# Patient Record
Sex: Female | Born: 1950 | Race: White | Hispanic: No | Marital: Married | Smoking: Never smoker
Health system: Southern US, Community
[De-identification: ages and names within clinical notes are randomized; demographics above are authoritative.]

## PROBLEM LIST (undated history)

## (undated) DIAGNOSIS — J45909 Unspecified asthma, uncomplicated: Secondary | ICD-10-CM

## (undated) DIAGNOSIS — I509 Heart failure, unspecified: Secondary | ICD-10-CM

## (undated) DIAGNOSIS — I519 Heart disease, unspecified: Secondary | ICD-10-CM

## (undated) DIAGNOSIS — R0600 Dyspnea, unspecified: Secondary | ICD-10-CM

## (undated) DIAGNOSIS — Z95810 Presence of automatic (implantable) cardiac defibrillator: Secondary | ICD-10-CM

## (undated) DIAGNOSIS — I4891 Unspecified atrial fibrillation: Secondary | ICD-10-CM

## (undated) DIAGNOSIS — I428 Other cardiomyopathies: Secondary | ICD-10-CM

## (undated) DIAGNOSIS — I10 Essential (primary) hypertension: Secondary | ICD-10-CM

## (undated) DIAGNOSIS — I442 Atrioventricular block, complete: Secondary | ICD-10-CM

## (undated) DIAGNOSIS — M1990 Unspecified osteoarthritis, unspecified site: Secondary | ICD-10-CM

## (undated) DIAGNOSIS — I340 Nonrheumatic mitral (valve) insufficiency: Secondary | ICD-10-CM

## (undated) DIAGNOSIS — Z9889 Other specified postprocedural states: Secondary | ICD-10-CM

## (undated) DIAGNOSIS — R519 Headache, unspecified: Secondary | ICD-10-CM

## (undated) DIAGNOSIS — E119 Type 2 diabetes mellitus without complications: Secondary | ICD-10-CM

## (undated) DIAGNOSIS — J189 Pneumonia, unspecified organism: Secondary | ICD-10-CM

## (undated) DIAGNOSIS — N184 Chronic kidney disease, stage 4 (severe): Secondary | ICD-10-CM

## (undated) DIAGNOSIS — R112 Nausea with vomiting, unspecified: Secondary | ICD-10-CM

## (undated) DIAGNOSIS — K219 Gastro-esophageal reflux disease without esophagitis: Secondary | ICD-10-CM

## (undated) DIAGNOSIS — I4819 Other persistent atrial fibrillation: Secondary | ICD-10-CM

## (undated) DIAGNOSIS — I350 Nonrheumatic aortic (valve) stenosis: Secondary | ICD-10-CM

## (undated) DIAGNOSIS — N189 Chronic kidney disease, unspecified: Secondary | ICD-10-CM

## (undated) DIAGNOSIS — Q211 Atrial septal defect, unspecified: Secondary | ICD-10-CM

## (undated) DIAGNOSIS — E039 Hypothyroidism, unspecified: Secondary | ICD-10-CM

## (undated) HISTORY — DX: Type 2 diabetes mellitus without complications: E11.9

## (undated) HISTORY — PX: CATARACT EXTRACTION: SUR2

## (undated) HISTORY — DX: Other cardiomyopathies: I42.8

## (undated) HISTORY — DX: Atrioventricular block, complete: I44.2

## (undated) HISTORY — PX: CHOLECYSTECTOMY: SHX55

## (undated) HISTORY — PX: BREAST BIOPSY: SHX20

## (undated) HISTORY — DX: Heart disease, unspecified: I51.9

## (undated) HISTORY — PX: ABLATION: SHX5711

## (undated) HISTORY — DX: Essential (primary) hypertension: I10

## (undated) HISTORY — PX: TOTAL VAGINAL HYSTERECTOMY: SHX2548

## (undated) HISTORY — DX: Chronic kidney disease, stage 4 (severe): N18.4

## (undated) HISTORY — PX: THYROIDECTOMY: SHX17

## (undated) HISTORY — DX: Presence of automatic (implantable) cardiac defibrillator: Z95.810

## (undated) HISTORY — DX: Other persistent atrial fibrillation: I48.19

## (undated) HISTORY — DX: Chronic kidney disease, unspecified: N18.9

## (undated) HISTORY — DX: Unspecified atrial fibrillation: I48.91

---

## 1998-03-02 ENCOUNTER — Inpatient Hospital Stay: Admission: EM | Admit: 1998-03-02 | Discharge: 1998-03-07 | Payer: Self-pay | Admitting: *Deleted

## 1998-03-02 ENCOUNTER — Encounter: Payer: Self-pay | Admitting: *Deleted

## 1998-03-04 ENCOUNTER — Encounter: Payer: Self-pay | Admitting: *Deleted

## 1998-03-06 ENCOUNTER — Encounter: Payer: Self-pay | Admitting: *Deleted

## 1998-04-02 ENCOUNTER — Inpatient Hospital Stay (HOSPITAL_COMMUNITY): Admission: AD | Admit: 1998-04-02 | Discharge: 1998-04-08 | Payer: Self-pay | Admitting: *Deleted

## 1998-04-02 ENCOUNTER — Encounter: Payer: Self-pay | Admitting: Thoracic Surgery (Cardiothoracic Vascular Surgery)

## 1998-04-03 ENCOUNTER — Encounter: Payer: Self-pay | Admitting: *Deleted

## 1998-04-04 ENCOUNTER — Encounter: Payer: Self-pay | Admitting: *Deleted

## 1998-04-05 ENCOUNTER — Encounter: Payer: Self-pay | Admitting: *Deleted

## 1998-04-06 ENCOUNTER — Encounter: Payer: Self-pay | Admitting: Cardiology

## 1998-04-06 ENCOUNTER — Encounter: Payer: Self-pay | Admitting: Thoracic Surgery (Cardiothoracic Vascular Surgery)

## 1998-04-07 ENCOUNTER — Encounter: Payer: Self-pay | Admitting: *Deleted

## 1998-04-30 ENCOUNTER — Encounter: Payer: Self-pay | Admitting: Internal Medicine

## 1998-04-30 ENCOUNTER — Inpatient Hospital Stay (HOSPITAL_COMMUNITY): Admission: EM | Admit: 1998-04-30 | Discharge: 1998-05-02 | Payer: Self-pay | Admitting: Emergency Medicine

## 1998-05-01 ENCOUNTER — Encounter: Payer: Self-pay | Admitting: *Deleted

## 1998-07-29 ENCOUNTER — Inpatient Hospital Stay (HOSPITAL_COMMUNITY): Admission: AD | Admit: 1998-07-29 | Discharge: 1998-08-02 | Payer: Self-pay | Admitting: *Deleted

## 2003-03-01 HISTORY — PX: CARDIAC DEFIBRILLATOR PLACEMENT: SHX171

## 2003-04-18 ENCOUNTER — Ambulatory Visit (HOSPITAL_COMMUNITY): Admission: RE | Admit: 2003-04-18 | Discharge: 2003-04-19 | Payer: Self-pay | Admitting: Internal Medicine

## 2003-04-23 ENCOUNTER — Ambulatory Visit (HOSPITAL_COMMUNITY): Admission: RE | Admit: 2003-04-23 | Discharge: 2003-04-24 | Payer: Self-pay | Admitting: Internal Medicine

## 2004-11-11 ENCOUNTER — Ambulatory Visit: Payer: Self-pay | Admitting: Internal Medicine

## 2004-11-11 ENCOUNTER — Ambulatory Visit: Payer: Self-pay | Admitting: *Deleted

## 2005-02-08 ENCOUNTER — Ambulatory Visit: Payer: Self-pay | Admitting: Internal Medicine

## 2005-05-10 ENCOUNTER — Ambulatory Visit: Payer: Self-pay | Admitting: Internal Medicine

## 2005-11-10 ENCOUNTER — Ambulatory Visit: Payer: Self-pay | Admitting: Internal Medicine

## 2011-05-24 DIAGNOSIS — E7800 Pure hypercholesterolemia, unspecified: Secondary | ICD-10-CM | POA: Insufficient documentation

## 2011-05-24 DIAGNOSIS — Z95810 Presence of automatic (implantable) cardiac defibrillator: Secondary | ICD-10-CM | POA: Insufficient documentation

## 2011-05-24 DIAGNOSIS — I10 Essential (primary) hypertension: Secondary | ICD-10-CM | POA: Insufficient documentation

## 2011-05-24 DIAGNOSIS — I420 Dilated cardiomyopathy: Secondary | ICD-10-CM | POA: Insufficient documentation

## 2012-04-15 DIAGNOSIS — N179 Acute kidney failure, unspecified: Secondary | ICD-10-CM | POA: Insufficient documentation

## 2012-04-15 HISTORY — DX: Acute kidney failure, unspecified: N17.9

## 2012-05-02 DIAGNOSIS — I5023 Acute on chronic systolic (congestive) heart failure: Secondary | ICD-10-CM | POA: Insufficient documentation

## 2012-09-11 DIAGNOSIS — K529 Noninfective gastroenteritis and colitis, unspecified: Secondary | ICD-10-CM | POA: Insufficient documentation

## 2012-09-11 HISTORY — DX: Noninfective gastroenteritis and colitis, unspecified: K52.9

## 2014-04-21 DIAGNOSIS — Z7901 Long term (current) use of anticoagulants: Secondary | ICD-10-CM | POA: Insufficient documentation

## 2014-04-21 DIAGNOSIS — Z4502 Encounter for adjustment and management of automatic implantable cardiac defibrillator: Secondary | ICD-10-CM | POA: Insufficient documentation

## 2014-04-21 HISTORY — DX: Encounter for adjustment and management of automatic implantable cardiac defibrillator: Z45.02

## 2015-12-18 DIAGNOSIS — I513 Intracardiac thrombosis, not elsewhere classified: Secondary | ICD-10-CM | POA: Insufficient documentation

## 2016-04-05 DIAGNOSIS — I4819 Other persistent atrial fibrillation: Secondary | ICD-10-CM | POA: Insufficient documentation

## 2016-04-09 DIAGNOSIS — N179 Acute kidney failure, unspecified: Secondary | ICD-10-CM | POA: Insufficient documentation

## 2016-04-10 DIAGNOSIS — E871 Hypo-osmolality and hyponatremia: Secondary | ICD-10-CM | POA: Insufficient documentation

## 2016-04-10 DIAGNOSIS — E119 Type 2 diabetes mellitus without complications: Secondary | ICD-10-CM | POA: Insufficient documentation

## 2016-04-10 DIAGNOSIS — Z794 Long term (current) use of insulin: Secondary | ICD-10-CM

## 2016-04-10 DIAGNOSIS — K529 Noninfective gastroenteritis and colitis, unspecified: Secondary | ICD-10-CM | POA: Insufficient documentation

## 2016-04-10 HISTORY — DX: Hypomagnesemia: E83.42

## 2016-04-10 HISTORY — DX: Hypo-osmolality and hyponatremia: E87.1

## 2016-04-10 HISTORY — DX: Type 2 diabetes mellitus without complications: E11.9

## 2016-04-10 HISTORY — DX: Long term (current) use of insulin: Z79.4

## 2016-04-11 DIAGNOSIS — E039 Hypothyroidism, unspecified: Secondary | ICD-10-CM | POA: Insufficient documentation

## 2016-04-11 DIAGNOSIS — K219 Gastro-esophageal reflux disease without esophagitis: Secondary | ICD-10-CM | POA: Insufficient documentation

## 2016-08-11 DIAGNOSIS — R21 Rash and other nonspecific skin eruption: Secondary | ICD-10-CM

## 2016-08-11 HISTORY — DX: Rash and other nonspecific skin eruption: R21

## 2016-08-26 DIAGNOSIS — N184 Chronic kidney disease, stage 4 (severe): Secondary | ICD-10-CM | POA: Insufficient documentation

## 2016-08-28 DIAGNOSIS — I2510 Atherosclerotic heart disease of native coronary artery without angina pectoris: Secondary | ICD-10-CM | POA: Insufficient documentation

## 2016-12-26 ENCOUNTER — Ambulatory Visit: Payer: Self-pay | Admitting: Internal Medicine

## 2016-12-29 ENCOUNTER — Encounter: Payer: Self-pay | Admitting: *Deleted

## 2016-12-29 ENCOUNTER — Encounter: Payer: Self-pay | Admitting: Cardiovascular Disease

## 2016-12-29 ENCOUNTER — Ambulatory Visit (INDEPENDENT_AMBULATORY_CARE_PROVIDER_SITE_OTHER): Payer: PPO | Admitting: Cardiovascular Disease

## 2016-12-29 VITALS — BP 131/66 | HR 70 | Ht 62.0 in | Wt 121.8 lb

## 2016-12-29 DIAGNOSIS — I5022 Chronic systolic (congestive) heart failure: Secondary | ICD-10-CM | POA: Diagnosis not present

## 2016-12-29 DIAGNOSIS — Z95810 Presence of automatic (implantable) cardiac defibrillator: Secondary | ICD-10-CM

## 2016-12-29 DIAGNOSIS — I5023 Acute on chronic systolic (congestive) heart failure: Secondary | ICD-10-CM

## 2016-12-29 DIAGNOSIS — E785 Hyperlipidemia, unspecified: Secondary | ICD-10-CM

## 2016-12-29 DIAGNOSIS — Z794 Long term (current) use of insulin: Secondary | ICD-10-CM

## 2016-12-29 DIAGNOSIS — E119 Type 2 diabetes mellitus without complications: Secondary | ICD-10-CM

## 2016-12-29 DIAGNOSIS — E7800 Pure hypercholesterolemia, unspecified: Secondary | ICD-10-CM

## 2016-12-29 DIAGNOSIS — N184 Chronic kidney disease, stage 4 (severe): Secondary | ICD-10-CM

## 2016-12-29 DIAGNOSIS — I420 Dilated cardiomyopathy: Secondary | ICD-10-CM | POA: Insufficient documentation

## 2016-12-29 DIAGNOSIS — I513 Intracardiac thrombosis, not elsewhere classified: Secondary | ICD-10-CM

## 2016-12-29 DIAGNOSIS — I10 Essential (primary) hypertension: Secondary | ICD-10-CM | POA: Diagnosis not present

## 2016-12-29 DIAGNOSIS — Z8679 Personal history of other diseases of the circulatory system: Secondary | ICD-10-CM | POA: Insufficient documentation

## 2016-12-29 DIAGNOSIS — D649 Anemia, unspecified: Secondary | ICD-10-CM | POA: Insufficient documentation

## 2016-12-29 DIAGNOSIS — E1122 Type 2 diabetes mellitus with diabetic chronic kidney disease: Secondary | ICD-10-CM | POA: Insufficient documentation

## 2016-12-29 DIAGNOSIS — I482 Chronic atrial fibrillation, unspecified: Secondary | ICD-10-CM | POA: Insufficient documentation

## 2016-12-29 HISTORY — DX: Anemia, unspecified: D64.9

## 2016-12-29 HISTORY — DX: Personal history of other diseases of the circulatory system: Z86.79

## 2016-12-29 NOTE — Patient Instructions (Addendum)
Your physician recommends that you schedule a follow-up appointment in: 3 months with Dr.Koneswaran   Please make apt to see Edrick Oh, RN, [facility]   You have been referred to [facility] for your ICD to be checked in December here at the [facility] physician recommends that you continue on your current medications as directed. Please refer to the Current Medication list given to you today.    If you need a refill on your cardiac medications before your next appointment, please call your pharmacy.     No lab work or tests ordered today.      Thank you for choosing [facility] !
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
## 2017-01-05 DIAGNOSIS — I420 Dilated cardiomyopathy: Secondary | ICD-10-CM | POA: Diagnosis not present

## 2017-01-09 DIAGNOSIS — Z6821 Body mass index (BMI) 21.0-21.9, adult: Secondary | ICD-10-CM | POA: Diagnosis not present

## 2017-01-09 DIAGNOSIS — M7661 Achilles tendinitis, right leg: Secondary | ICD-10-CM | POA: Diagnosis not present

## 2017-01-09 DIAGNOSIS — I10 Essential (primary) hypertension: Secondary | ICD-10-CM | POA: Diagnosis not present

## 2017-01-09 DIAGNOSIS — R944 Abnormal results of kidney function studies: Secondary | ICD-10-CM | POA: Diagnosis not present

## 2017-01-09 DIAGNOSIS — E782 Mixed hyperlipidemia: Secondary | ICD-10-CM | POA: Diagnosis not present

## 2017-01-09 DIAGNOSIS — I4891 Unspecified atrial fibrillation: Secondary | ICD-10-CM | POA: Diagnosis not present

## 2017-01-09 DIAGNOSIS — E119 Type 2 diabetes mellitus without complications: Secondary | ICD-10-CM | POA: Diagnosis not present

## 2017-01-09 DIAGNOSIS — E039 Hypothyroidism, unspecified: Secondary | ICD-10-CM | POA: Diagnosis not present

## 2017-01-24 ENCOUNTER — Telehealth: Payer: Self-pay

## 2017-01-24 DIAGNOSIS — E782 Mixed hyperlipidemia: Secondary | ICD-10-CM | POA: Diagnosis not present

## 2017-01-24 DIAGNOSIS — R944 Abnormal results of kidney function studies: Secondary | ICD-10-CM | POA: Diagnosis not present

## 2017-01-24 DIAGNOSIS — I10 Essential (primary) hypertension: Secondary | ICD-10-CM | POA: Diagnosis not present

## 2017-01-24 DIAGNOSIS — M7661 Achilles tendinitis, right leg: Secondary | ICD-10-CM | POA: Diagnosis not present

## 2017-01-24 DIAGNOSIS — E119 Type 2 diabetes mellitus without complications: Secondary | ICD-10-CM | POA: Diagnosis not present

## 2017-01-24 DIAGNOSIS — Z6821 Body mass index (BMI) 21.0-21.9, adult: Secondary | ICD-10-CM | POA: Diagnosis not present

## 2017-01-24 DIAGNOSIS — I4891 Unspecified atrial fibrillation: Secondary | ICD-10-CM | POA: Diagnosis not present

## 2017-01-24 DIAGNOSIS — E039 Hypothyroidism, unspecified: Secondary | ICD-10-CM | POA: Diagnosis not present

## 2017-01-24 NOTE — Telephone Encounter (Signed)
Patient referred to [facility] clinic by Dr Dwana Curd.  Call to patient and provided ICM intro.  She agreed to [facility] monthly follow up.  She lives with daughter and has monitor by bedside. She has relocated from [place] and recently moved to this area.  Explained I have requested through [employer] to have her released to our device clinic and will schedule the 1st ICM remote transmission for 02/09/2017.  Provided ICM direct number.  She does not have a voice mail set up.   She has initial new patient appointment with Dr Lovena Le on 03/16/2017.

## 2017-01-30 DIAGNOSIS — I482 Chronic atrial fibrillation: Secondary | ICD-10-CM | POA: Diagnosis not present

## 2017-01-30 DIAGNOSIS — E039 Hypothyroidism, unspecified: Secondary | ICD-10-CM | POA: Diagnosis not present

## 2017-01-30 DIAGNOSIS — I10 Essential (primary) hypertension: Secondary | ICD-10-CM | POA: Diagnosis not present

## 2017-01-30 DIAGNOSIS — Z8679 Personal history of other diseases of the circulatory system: Secondary | ICD-10-CM | POA: Diagnosis not present

## 2017-01-30 DIAGNOSIS — E119 Type 2 diabetes mellitus without complications: Secondary | ICD-10-CM | POA: Diagnosis not present

## 2017-01-30 DIAGNOSIS — N184 Chronic kidney disease, stage 4 (severe): Secondary | ICD-10-CM | POA: Diagnosis not present

## 2017-01-30 DIAGNOSIS — Z6821 Body mass index (BMI) 21.0-21.9, adult: Secondary | ICD-10-CM | POA: Diagnosis not present

## 2017-01-30 DIAGNOSIS — I509 Heart failure, unspecified: Secondary | ICD-10-CM | POA: Diagnosis not present

## 2017-01-30 DIAGNOSIS — E782 Mixed hyperlipidemia: Secondary | ICD-10-CM | POA: Diagnosis not present

## 2017-02-09 ENCOUNTER — Ambulatory Visit (INDEPENDENT_AMBULATORY_CARE_PROVIDER_SITE_OTHER): Payer: PPO

## 2017-02-09 DIAGNOSIS — I5022 Chronic systolic (congestive) heart failure: Secondary | ICD-10-CM

## 2017-02-09 DIAGNOSIS — Z95810 Presence of automatic (implantable) cardiac defibrillator: Secondary | ICD-10-CM | POA: Diagnosis not present

## 2017-02-10 NOTE — Progress Notes (Signed)
EPIC Encounter for ICM Monitoring  Patient Name: Emily [name] is a 66 y.o. female Date: 02/10/2017 Primary Care Physican: Celene Squibb, MD Primary Cardiologist: Bronson Ing Electrophysiologist: Lovena Le Dry Weight: 120 lbs ( 119-120 lbs is baseline ) Bi-V Pacing:   94%   Clinical Status (05-Jan-2017 to 10-Feb-2017) Treated VT/VF 0 episodes AT/AF 99 episodes  Time in AT/AF 0.2 hr/day (0.9%)  Longest AT/AF 21 minutes VT-NS (>4 beats, >150 bpm) 4 Observations (2) (05-Jan-2017 to 10-Feb-2017)  Alert: AT/AF >= 6 hr for 1 days.  Avg. Ventricular Rate >= 100 bpm during AT/AF (>= 6 hr) for 1 days.      Heart Failure questions reviewed, pt asymptomatic for fluid symptoms.  She reported she felt her heart racing last night after going to bed and this is the first time she has felt Afib since the ablation.  Device clinic nurse reviewed transmission and one of the AF episode occurred 02/09/2017.    Thoracic impedance normal.  Prescribed dosage: Furosemide 40 mg 2 tablets (80 mg total) twice a day.  Potassium 20 mEq 1 tablet once a day  Recommendations: Patient took extra Metoprolol last night because of heart racing as directed by Dr Bronson Ing.  She said she feels fine today.   Encouraged to call for fluid symptoms.  Follow-up plan: ICM clinic phone appointment on 03/13/2017.  Office appointment scheduled 03/16/2017 with Dr. Lovena Le (1st visit - patient recently moved to the area).  Copy of ICM check sent to Dr. Bronson Ing and Dr. Lovena Le.   3 month ICM trend: 02/10/2017    1 Year ICM trend:       [name], RN 02/10/2017 10:16 AM

## 2017-02-27 DIAGNOSIS — Z6821 Body mass index (BMI) 21.0-21.9, adult: Secondary | ICD-10-CM | POA: Diagnosis not present

## 2017-02-27 DIAGNOSIS — E039 Hypothyroidism, unspecified: Secondary | ICD-10-CM | POA: Diagnosis not present

## 2017-02-27 DIAGNOSIS — E119 Type 2 diabetes mellitus without complications: Secondary | ICD-10-CM | POA: Diagnosis not present

## 2017-02-27 DIAGNOSIS — Z8679 Personal history of other diseases of the circulatory system: Secondary | ICD-10-CM | POA: Diagnosis not present

## 2017-02-27 DIAGNOSIS — I4891 Unspecified atrial fibrillation: Secondary | ICD-10-CM | POA: Diagnosis not present

## 2017-02-27 DIAGNOSIS — I509 Heart failure, unspecified: Secondary | ICD-10-CM | POA: Diagnosis not present

## 2017-02-27 DIAGNOSIS — M7661 Achilles tendinitis, right leg: Secondary | ICD-10-CM | POA: Diagnosis not present

## 2017-02-27 DIAGNOSIS — N184 Chronic kidney disease, stage 4 (severe): Secondary | ICD-10-CM | POA: Diagnosis not present

## 2017-02-27 DIAGNOSIS — R944 Abnormal results of kidney function studies: Secondary | ICD-10-CM | POA: Diagnosis not present

## 2017-02-27 DIAGNOSIS — I10 Essential (primary) hypertension: Secondary | ICD-10-CM | POA: Diagnosis not present

## 2017-02-27 DIAGNOSIS — I482 Chronic atrial fibrillation: Secondary | ICD-10-CM | POA: Diagnosis not present

## 2017-02-27 DIAGNOSIS — E782 Mixed hyperlipidemia: Secondary | ICD-10-CM | POA: Diagnosis not present

## 2017-03-01 DIAGNOSIS — R070 Pain in throat: Secondary | ICD-10-CM | POA: Diagnosis not present

## 2017-03-01 DIAGNOSIS — E039 Hypothyroidism, unspecified: Secondary | ICD-10-CM | POA: Diagnosis not present

## 2017-03-01 DIAGNOSIS — I10 Essential (primary) hypertension: Secondary | ICD-10-CM | POA: Diagnosis not present

## 2017-03-01 DIAGNOSIS — E782 Mixed hyperlipidemia: Secondary | ICD-10-CM | POA: Diagnosis not present

## 2017-03-01 DIAGNOSIS — Z6821 Body mass index (BMI) 21.0-21.9, adult: Secondary | ICD-10-CM | POA: Diagnosis not present

## 2017-03-01 DIAGNOSIS — E119 Type 2 diabetes mellitus without complications: Secondary | ICD-10-CM | POA: Diagnosis not present

## 2017-03-01 DIAGNOSIS — N184 Chronic kidney disease, stage 4 (severe): Secondary | ICD-10-CM | POA: Diagnosis not present

## 2017-03-01 DIAGNOSIS — I4891 Unspecified atrial fibrillation: Secondary | ICD-10-CM | POA: Diagnosis not present

## 2017-03-01 DIAGNOSIS — L299 Pruritus, unspecified: Secondary | ICD-10-CM | POA: Diagnosis not present

## 2017-03-13 ENCOUNTER — Telehealth: Payer: Self-pay

## 2017-03-13 ENCOUNTER — Ambulatory Visit (INDEPENDENT_AMBULATORY_CARE_PROVIDER_SITE_OTHER): Payer: PPO

## 2017-03-13 DIAGNOSIS — I5022 Chronic systolic (congestive) heart failure: Secondary | ICD-10-CM | POA: Diagnosis not present

## 2017-03-13 DIAGNOSIS — Z95810 Presence of automatic (implantable) cardiac defibrillator: Secondary | ICD-10-CM | POA: Diagnosis not present

## 2017-03-13 NOTE — Progress Notes (Signed)
EPIC Encounter for ICM Monitoring  Patient Name: Emily [name] is a 67 y.o. female Date: 03/13/2017 Primary Care Physican: Celene Squibb, MD Primary Cardiologist: Bronson Ing Electrophysiologist: Lovena Le Dry Weight:    119 lbs ( 119-120 lbs is baseline ) Bi-V Pacing:   92.4%    Clinical Status (10-Feb-2017 to 13-Mar-2017) Treated VT/VF 0 episodes  AT/AF 133 episodes  Time in AT/AF 0.3 hr/day (1.0%)  Longest AT/AF 16 minutes Observations (2) (10-Feb-2017 to 13-Mar-2017)  Alert: AT/AF >= 6 hr for 1 days.  Avg. Ventricular Rate >= 100 bpm during AT/AF (>= 6 hr) for 1 days.   OBSERVATIONS (5)  Alert: AT/AF >= 6 hr for 1 days.  Avg. Ventricular Rate >= 100 bpm during AT/AF (>= 6 hr) for 1 days.  A. Capture Management: Actual safety margin (2.4 X) > programmed margin (2 X).  1 monitored VT episodes, longest was 28 sec.  Ventricular sensing episodes averaged 11 min/day since the last session.     Heart Failure questions reviewed, pt asymptomatic.   Thoracic impedance normal.  Prescribed dosage: Furosemide 40 mg 2 tablets (80 mg total) twice a day.  Potassium 20 mEq 1 tablet once a day  Labs: 01/24/2017 Creatinine 2.07, BUN 36, Sodium 140. EGFR 24-28  Recommendations: No changes.   Encouraged to call for fluid symptoms.  Follow-up plan: ICM clinic phone appointment on 04/13/2017.  Patient will call the office to reschedule the 03/16/2017 appointment with Dr Lovena Le.  Copy of ICM check sent to Dr. Lovena Le.   3 month ICM trend: 03/13/2017    1 Year ICM trend:       [name], RN 03/13/2017 1:33 PM

## 2017-03-13 NOTE — Telephone Encounter (Signed)
Spoke with pt and reminded pt of remote transmission that is due today. Pt verbalized understanding.   

## 2017-03-16 ENCOUNTER — Ambulatory Visit: Payer: PPO | Admitting: Internal Medicine

## 2017-03-29 DIAGNOSIS — Z8679 Personal history of other diseases of the circulatory system: Secondary | ICD-10-CM | POA: Diagnosis not present

## 2017-03-29 DIAGNOSIS — I4891 Unspecified atrial fibrillation: Secondary | ICD-10-CM | POA: Diagnosis not present

## 2017-03-29 DIAGNOSIS — E782 Mixed hyperlipidemia: Secondary | ICD-10-CM | POA: Diagnosis not present

## 2017-03-29 DIAGNOSIS — I482 Chronic atrial fibrillation: Secondary | ICD-10-CM | POA: Diagnosis not present

## 2017-03-29 DIAGNOSIS — E119 Type 2 diabetes mellitus without complications: Secondary | ICD-10-CM | POA: Diagnosis not present

## 2017-03-29 DIAGNOSIS — M7661 Achilles tendinitis, right leg: Secondary | ICD-10-CM | POA: Diagnosis not present

## 2017-03-29 DIAGNOSIS — R944 Abnormal results of kidney function studies: Secondary | ICD-10-CM | POA: Diagnosis not present

## 2017-03-29 DIAGNOSIS — I10 Essential (primary) hypertension: Secondary | ICD-10-CM | POA: Diagnosis not present

## 2017-03-29 DIAGNOSIS — E039 Hypothyroidism, unspecified: Secondary | ICD-10-CM | POA: Diagnosis not present

## 2017-03-29 DIAGNOSIS — Z6821 Body mass index (BMI) 21.0-21.9, adult: Secondary | ICD-10-CM | POA: Diagnosis not present

## 2017-03-29 DIAGNOSIS — I509 Heart failure, unspecified: Secondary | ICD-10-CM | POA: Diagnosis not present

## 2017-03-29 DIAGNOSIS — N184 Chronic kidney disease, stage 4 (severe): Secondary | ICD-10-CM | POA: Diagnosis not present

## 2017-04-03 ENCOUNTER — Telehealth: Payer: Self-pay | Admitting: Cardiovascular Disease

## 2017-04-03 MED ORDER — WARFARIN SODIUM 2 MG PO TABS: ORAL_TABLET | ORAL | 0 refills | Status: DC

## 2017-04-03 NOTE — Telephone Encounter (Signed)
°*  STAT* If patient is at the pharmacy, call can be transferred to refill team.   1. Which medications need to be refilled? (please list name of each medication and dose if known)  warfarin (COUMADIN) 2 MG tablet [[id number]]   2. Which pharmacy/location (including street and city if local pharmacy) is medication to be sent to?  [facility]   3. Do they need a 30 day or 90 day supply?  90 day

## 2017-04-10 ENCOUNTER — Ambulatory Visit: Payer: PPO | Admitting: *Deleted

## 2017-04-10 ENCOUNTER — Ambulatory Visit: Payer: PPO | Admitting: Cardiovascular Disease

## 2017-04-10 ENCOUNTER — Encounter: Payer: Self-pay | Admitting: Cardiovascular Disease

## 2017-04-10 VITALS — BP 122/60 | HR 80 | Ht 62.0 in | Wt 127.0 lb

## 2017-04-10 DIAGNOSIS — I4891 Unspecified atrial fibrillation: Secondary | ICD-10-CM

## 2017-04-10 DIAGNOSIS — Z95810 Presence of automatic (implantable) cardiac defibrillator: Secondary | ICD-10-CM

## 2017-04-10 DIAGNOSIS — N184 Chronic kidney disease, stage 4 (severe): Secondary | ICD-10-CM

## 2017-04-10 DIAGNOSIS — I5022 Chronic systolic (congestive) heart failure: Secondary | ICD-10-CM

## 2017-04-10 DIAGNOSIS — Z5181 Encounter for therapeutic drug level monitoring: Secondary | ICD-10-CM

## 2017-04-10 DIAGNOSIS — E785 Hyperlipidemia, unspecified: Secondary | ICD-10-CM | POA: Diagnosis not present

## 2017-04-10 DIAGNOSIS — I10 Essential (primary) hypertension: Secondary | ICD-10-CM | POA: Diagnosis not present

## 2017-04-10 DIAGNOSIS — Z79899 Other long term (current) drug therapy: Secondary | ICD-10-CM | POA: Diagnosis not present

## 2017-04-10 DIAGNOSIS — I482 Chronic atrial fibrillation, unspecified: Secondary | ICD-10-CM

## 2017-04-10 DIAGNOSIS — I513 Intracardiac thrombosis, not elsewhere classified: Secondary | ICD-10-CM

## 2017-04-10 MED ORDER — FUROSEMIDE 40 MG PO TABS: 40.0000 mg | ORAL_TABLET | Freq: Two times a day (BID) | ORAL | 3 refills | Status: DC

## 2017-04-10 NOTE — Patient Instructions (Addendum)
Your physician wants you to follow-up in: 6 months with Dr.Koneswaran You will receive a reminder letter in the mail two months in advance. If you don't receive a letter, please call our office to schedule the follow-up appointment.   DECREASE lasix to 40 mg twice a day.If you gain more than 3 lbs in 24 hours, go back to the 80 mg twice a day dose  If you need a refill on your cardiac medications before your next appointment, please call your pharmacy.    Please get lab work : BMET in 1 month    No tests ordered today.    Thank you for choosing [facility] !
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
## 2017-04-13 ENCOUNTER — Ambulatory Visit (INDEPENDENT_AMBULATORY_CARE_PROVIDER_SITE_OTHER): Payer: Self-pay

## 2017-04-13 DIAGNOSIS — Z95810 Presence of automatic (implantable) cardiac defibrillator: Secondary | ICD-10-CM

## 2017-04-13 DIAGNOSIS — I5022 Chronic systolic (congestive) heart failure: Secondary | ICD-10-CM

## 2017-04-13 NOTE — Progress Notes (Signed)
EPIC Encounter for ICM Monitoring  Patient Name: Emily [name] is a 67 y.o. female Date: 04/13/2017 Primary Care Physican: Celene Squibb, MD Primary Cardiologist:Koneswaran Electrophysiologist:Taylor Dry Weight: Previous weight 119lbs ( 119-120 lbs is baseline ) Bi-V Pacing:92.1%   Since 13-Mar-2017 Time in AT/AF <0.1 hr/day (<0.1%) Longest AT/AF 73 seconds   VT-NS (>4 beats, >150 bpm)  1   No call to patient since she is having a defib office check with Dr Caryl Comes today.   Thoracic impedance normal.  Prescribed dosage: Furosemide40 mg twice daily. Dr Bronson Ing instructed her (04/10/17 office visit) to resume 80 mg twice daily should she gain 3 pounds in 24 hours or have increasing symptoms of chest pain and/or leg swelling.   Labs: 01/24/2017 Creatinine 2.07, BUN 36, Sodium 140. EGFR 24-28  Recommendations:  None  Follow-up plan: ICM clinic phone appointment on 05/15/2017.  Office appointment scheduled 05/18/2017 with Dr. Lovena Le.  Copy of ICM check sent to Dr. Lovena Le and Dr. Bronson Ing.   3 month ICM trend: 04/13/2017    1 Year ICM trend:       [name], RN 04/13/2017 10:05 AM

## 2017-04-14 DIAGNOSIS — N184 Chronic kidney disease, stage 4 (severe): Secondary | ICD-10-CM | POA: Diagnosis not present

## 2017-04-14 DIAGNOSIS — I509 Heart failure, unspecified: Secondary | ICD-10-CM | POA: Diagnosis not present

## 2017-04-14 DIAGNOSIS — E1129 Type 2 diabetes mellitus with other diabetic kidney complication: Secondary | ICD-10-CM | POA: Diagnosis not present

## 2017-04-20 ENCOUNTER — Other Ambulatory Visit: Payer: Self-pay | Admitting: Cardiovascular Disease

## 2017-04-20 MED ORDER — SIMVASTATIN 20 MG PO TABS: 20.0000 mg | ORAL_TABLET | Freq: Every day | ORAL | 1 refills | Status: DC

## 2017-04-20 NOTE — Telephone Encounter (Signed)
simvastatin (ZOCOR) 20 MG tablet [[id number]] ENDED  Pt is needing this sent to [facility] in [place]. She has ran out of the medication she had left from her previous Dr. At [facility]

## 2017-04-20 NOTE — Telephone Encounter (Signed)
Refilled to [facility]

## 2017-04-28 DIAGNOSIS — E119 Type 2 diabetes mellitus without complications: Secondary | ICD-10-CM | POA: Diagnosis not present

## 2017-04-28 DIAGNOSIS — I482 Chronic atrial fibrillation: Secondary | ICD-10-CM | POA: Diagnosis not present

## 2017-04-28 DIAGNOSIS — I4891 Unspecified atrial fibrillation: Secondary | ICD-10-CM | POA: Diagnosis not present

## 2017-04-28 DIAGNOSIS — E039 Hypothyroidism, unspecified: Secondary | ICD-10-CM | POA: Diagnosis not present

## 2017-04-28 DIAGNOSIS — R070 Pain in throat: Secondary | ICD-10-CM | POA: Diagnosis not present

## 2017-04-28 DIAGNOSIS — I509 Heart failure, unspecified: Secondary | ICD-10-CM | POA: Diagnosis not present

## 2017-04-28 DIAGNOSIS — N184 Chronic kidney disease, stage 4 (severe): Secondary | ICD-10-CM | POA: Diagnosis not present

## 2017-04-28 DIAGNOSIS — I10 Essential (primary) hypertension: Secondary | ICD-10-CM | POA: Diagnosis not present

## 2017-04-28 DIAGNOSIS — Z8679 Personal history of other diseases of the circulatory system: Secondary | ICD-10-CM | POA: Diagnosis not present

## 2017-04-28 DIAGNOSIS — R944 Abnormal results of kidney function studies: Secondary | ICD-10-CM | POA: Diagnosis not present

## 2017-04-28 DIAGNOSIS — E782 Mixed hyperlipidemia: Secondary | ICD-10-CM | POA: Diagnosis not present

## 2017-04-28 DIAGNOSIS — L299 Pruritus, unspecified: Secondary | ICD-10-CM | POA: Diagnosis not present

## 2017-05-01 ENCOUNTER — Other Ambulatory Visit (HOSPITAL_COMMUNITY): Payer: Self-pay | Admitting: Nephrology

## 2017-05-01 DIAGNOSIS — N183 Chronic kidney disease, stage 3 unspecified: Secondary | ICD-10-CM

## 2017-05-11 ENCOUNTER — Ambulatory Visit (HOSPITAL_COMMUNITY)
Admission: RE | Admit: 2017-05-11 | Discharge: 2017-05-11 | Disposition: A | Discharge status: Home / Self Care | Payer: PPO | Source: Ambulatory Visit | Attending: Nephrology | Admitting: Nephrology

## 2017-05-11 DIAGNOSIS — D509 Iron deficiency anemia, unspecified: Secondary | ICD-10-CM | POA: Diagnosis not present

## 2017-05-11 DIAGNOSIS — N183 Chronic kidney disease, stage 3 unspecified: Secondary | ICD-10-CM

## 2017-05-11 DIAGNOSIS — R809 Proteinuria, unspecified: Secondary | ICD-10-CM | POA: Diagnosis not present

## 2017-05-11 DIAGNOSIS — Z1159 Encounter for screening for other viral diseases: Secondary | ICD-10-CM | POA: Diagnosis not present

## 2017-05-11 DIAGNOSIS — E559 Vitamin D deficiency, unspecified: Secondary | ICD-10-CM | POA: Diagnosis not present

## 2017-05-11 DIAGNOSIS — Z79899 Other long term (current) drug therapy: Secondary | ICD-10-CM | POA: Diagnosis not present

## 2017-05-11 DIAGNOSIS — I10 Essential (primary) hypertension: Secondary | ICD-10-CM | POA: Diagnosis not present

## 2017-05-11 DIAGNOSIS — D519 Vitamin B12 deficiency anemia, unspecified: Secondary | ICD-10-CM | POA: Diagnosis not present

## 2017-05-12 ENCOUNTER — Other Ambulatory Visit: Payer: Self-pay | Admitting: Cardiovascular Disease

## 2017-05-15 ENCOUNTER — Ambulatory Visit (INDEPENDENT_AMBULATORY_CARE_PROVIDER_SITE_OTHER): Payer: PPO

## 2017-05-15 ENCOUNTER — Telehealth: Payer: Self-pay | Admitting: Cardiology

## 2017-05-15 DIAGNOSIS — Z95810 Presence of automatic (implantable) cardiac defibrillator: Secondary | ICD-10-CM

## 2017-05-15 DIAGNOSIS — I5022 Chronic systolic (congestive) heart failure: Secondary | ICD-10-CM | POA: Diagnosis not present

## 2017-05-15 NOTE — Telephone Encounter (Signed)
Spoke with pt and reminded pt of remote transmission that is due today. Pt verbalized understanding.   

## 2017-05-16 NOTE — Progress Notes (Signed)
EPIC Encounter for ICM Monitoring  Patient Name: Emily [name] is a 67 y.o. female Date: 05/16/2017 Primary Care Physican: Celene Squibb, MD Primary Cardiologist:Koneswaran Electrophysiologist:Taylor Dry Weight: 125lbs  Bi-V Pacing:92.6%      Heart Failure questions reviewed, pt asymptomatic.  She has been eating out a lot. Advised to limit salt intake and explained restaurant foods are high in sodium.    Thoracic impedance normal.  Prescribed dosage: Furosemide40 mg twice daily. Dr Bronson Ing instructed her (04/10/17 office visit) to resume 80 mg twice daily should she gain 3 pounds in 24 hours or have increasing symptoms of chest pain and/or leg swelling.   Labs: 01/24/2017 Creatinine 2.07, BUN 36, Sodium 140. EGFR 24-28  Recommendations: No changes.   Encouraged to call for fluid symptoms.  Follow-up plan: ICM clinic phone appointment on 06/19/2017.  Office appointment scheduled 05/18/2017 with Dr. Lovena Le (1st visit).  Copy of ICM check sent to Dr. Lovena Le.   3 month ICM trend: 05/15/2017    1 Year ICM trend:       [name], RN 05/16/2017 2:38 PM

## 2017-05-18 ENCOUNTER — Ambulatory Visit: Payer: PPO | Admitting: Internal Medicine

## 2017-05-18 ENCOUNTER — Encounter: Payer: Self-pay | Admitting: Internal Medicine

## 2017-05-18 VITALS — BP 116/70 | HR 67 | Ht 62.0 in | Wt 129.8 lb

## 2017-05-18 DIAGNOSIS — Z95810 Presence of automatic (implantable) cardiac defibrillator: Secondary | ICD-10-CM

## 2017-05-18 DIAGNOSIS — I4891 Unspecified atrial fibrillation: Secondary | ICD-10-CM

## 2017-05-18 DIAGNOSIS — I5022 Chronic systolic (congestive) heart failure: Secondary | ICD-10-CM | POA: Diagnosis not present

## 2017-05-18 NOTE — Progress Notes (Signed)
HPI Emily [name] is referred today by Dr. Gerilyn Nestle for ongoing evaluation of atrial and ventricular arrhythmias, s/p ICD insertion in the setting of a non-ischemic CM. She had her initial ICD placed in 2005. Her current device was placed in 2016. She has had atrial fib as well. She denies chest pain or sob and remains active. No recent ICD shocks. She does have SVT and a recent successful ATP with successful termination with VAAV yet the tachycardia resolved. She has class 1-2 symptoms. She denies sodium indiscretion, ETOH or caffeine excess.  Allergies  Allergen Reactions  . Codeine Other (See Comments)    Euphoria, hallucinations  . Other Palpitations    Other reaction(s): Other (See Comments) PT STATES SHE GETS REALLY HOT , DIZZY HEADED, HAS PASSED OUT TWICE , AND ITCHING WITH IV DYE Tumeric  . Iodine Rash    topical     Current Outpatient Medications  Medication Sig Dispense Refill  . Biotin 1000 MCG tablet Take 1,000 mcg by mouth daily.     . Calcium Carb-Cholecalciferol (CALCIUM 600-D PO) Take by mouth.    . Calcium Carbonate (CALCIUM 600 PO) Take 1 tablet by mouth daily.    . furosemide (LASIX) 40 MG tablet Take 1 tablet (40 mg total) by mouth 2 (two) times daily. 180 tablet 3  . Insulin Glargine 300 UNIT/ML SOPN Inject 20 Units into the skin at bedtime.     . insulin lispro (HUMALOG) 100 UNIT/ML KiwkPen Inject 6 Units into the skin. Take before dinner    . levothyroxine (SYNTHROID, LEVOTHROID) 88 MCG tablet Take 88 mcg by mouth daily before breakfast.     . MELATONIN PO Take by mouth.    . metoprolol succinate (TOPROL-XL) 100 MG 24 hr tablet TAKE 1 TABLET ONCE DAILY.    Marland Kitchen Omega-3 Fatty Acids (FISH OIL PO) Take 1,000 mg by mouth daily.     Marland Kitchen omeprazole (PRILOSEC) 40 MG capsule Take 40 mg by mouth daily.     . potassium chloride SA (K-DUR,KLOR-CON) 20 MEQ tablet Take 20 mEq by mouth daily.     . simvastatin (ZOCOR) 20 MG tablet TAKE 1 TABLET BY MOUTH ONCE DAILY AT 6 PM.  28 tablet 11  . warfarin (COUMADIN) 2 MG tablet Take 1 tablet on Monday, Wednesday and Friday. Take 2 tablets(4 mg) on  all other days.  As directed by physician to maintain INR level. 135 tablet 0   No current facility-administered medications for this visit.      Past Medical History:  Diagnosis Date  . DM (diabetes mellitus) ([place])   . HTN (hypertension)     ROS:   All systems reviewed and negative except as noted in the HPI.   Past Surgical History:  Procedure Laterality Date  . ABLATION    . BREAST BIOPSY    . CARDIAC DEFIBRILLATOR PLACEMENT  2005  . CATARACT EXTRACTION Bilateral   . CHOLECYSTECTOMY    . THYROIDECTOMY    . TOTAL VAGINAL HYSTERECTOMY       Family History  Problem Relation Age of Onset  . Hip fracture Mother   . COPD Father   . CAD Sister   . Heart attack Sister      Social History   Socioeconomic History  . Marital status: Married    Spouse name: Not on file  . Number of children: Not on file  . Years of education: Not on file  . Highest education level: Not on file  Occupational History  . Not on file  Social Needs  . Financial resource strain: Not on file  . Food insecurity:    Worry: Not on file    Inability: Not on file  . Transportation needs:    Medical: Not on file    Non-medical: Not on file  Tobacco Use  . Smoking status: Never Smoker  . Smokeless tobacco: Never Used  Substance and Sexual Activity  . Alcohol use: No  . Drug use: No  . Sexual activity: Not on file  Lifestyle  . Physical activity:    Days per week: Not on file    Minutes per session: Not on file  . Stress: Not on file  Relationships  . Social connections:    Talks on phone: Not on file    Gets together: Not on file    Attends religious service: Not on file    Active member of club or organization: Not on file    Attends meetings of clubs or organizations: Not on file    Relationship status: Not on file  . Intimate partner violence:    Fear of  current or ex partner: Not on file    Emotionally abused: Not on file    Physically abused: Not on file    Forced sexual activity: Not on file  Other Topics Concern  . Not on file  Social History Narrative  . Not on file     BP 116/70   Pulse 67   Ht 5\' 2"  (1.575 m)   Wt 129 lb 12.8 oz (58.9 kg)   SpO2 98%   BMI 23.74 kg/m   Physical Exam:  Well appearing 67 yo man, NAD HEENT: Unremarkable Neck:  6 cm JVD, no thyromegally Lymphatics:  No adenopathy Back:  No CVA tenderness Lungs:  Clear with no wheezes HEART:  Regular rate rhythm, no murmurs, no rubs, no clicks Abd:  soft, positive bowel sounds, no organomegally, no rebound, no guarding Ext:  2 plus pulses, no edema, no cyanosis, no clubbing Skin:  No rashes no nodules Neuro:  CN II through XII intact, motor grossly intact  DEVICE  Normal device function.  See PaceArt for details.   Assess/Plan: 1. PAF - she is asymptomatic. She is maintaining NSR. She will continue her current meds including Warfarin. 2. Chronic systolic heart failure - she appears to be euvolemic. No change in her meds. 3. ICD - interogation of her medtronic device demonstrates normal device function with over 5 years of battery longevity. Will recheck in several months.  Mikle Bosworth.D.

## 2017-05-18 NOTE — Patient Instructions (Signed)
Medication Instructions:  Your physician recommends that you continue on your current medications as directed. Please refer to the Current Medication list given to you today.   Labwork: NONE   Testing/Procedures: None   Follow-Up: Your physician wants you to follow-up in: 1 Year with Dr. Lovena Le. You will receive a reminder letter in the mail two months in advance. If you don't receive a letter, please call our office to schedule the follow-up appointment.   Any Other Special Instructions Will Be Listed Below (If Applicable).     If you need a refill on your cardiac medications before your next appointment, please call your pharmacy. Thank you for choosing [facility]!

## 2017-05-19 DIAGNOSIS — I509 Heart failure, unspecified: Secondary | ICD-10-CM | POA: Diagnosis not present

## 2017-05-19 DIAGNOSIS — Z8679 Personal history of other diseases of the circulatory system: Secondary | ICD-10-CM | POA: Diagnosis not present

## 2017-05-19 DIAGNOSIS — N184 Chronic kidney disease, stage 4 (severe): Secondary | ICD-10-CM | POA: Diagnosis not present

## 2017-05-19 DIAGNOSIS — I10 Essential (primary) hypertension: Secondary | ICD-10-CM | POA: Diagnosis not present

## 2017-05-19 DIAGNOSIS — I4891 Unspecified atrial fibrillation: Secondary | ICD-10-CM | POA: Diagnosis not present

## 2017-05-19 DIAGNOSIS — E782 Mixed hyperlipidemia: Secondary | ICD-10-CM | POA: Diagnosis not present

## 2017-05-19 DIAGNOSIS — I482 Chronic atrial fibrillation: Secondary | ICD-10-CM | POA: Diagnosis not present

## 2017-05-19 DIAGNOSIS — R070 Pain in throat: Secondary | ICD-10-CM | POA: Diagnosis not present

## 2017-05-19 DIAGNOSIS — R944 Abnormal results of kidney function studies: Secondary | ICD-10-CM | POA: Diagnosis not present

## 2017-05-19 DIAGNOSIS — Z5181 Encounter for therapeutic drug level monitoring: Secondary | ICD-10-CM | POA: Diagnosis not present

## 2017-05-19 DIAGNOSIS — L299 Pruritus, unspecified: Secondary | ICD-10-CM | POA: Diagnosis not present

## 2017-05-19 DIAGNOSIS — E119 Type 2 diabetes mellitus without complications: Secondary | ICD-10-CM | POA: Diagnosis not present

## 2017-05-23 LAB — CUP PACEART INCLINIC DEVICE CHECK
Implantable Lead Implant Date: 20050218
Implantable Lead Implant Date: 20050218
Implantable Lead Implant Date: 20050218
Implantable Lead Location: 753859
Implantable Lead Model: 4194
Implantable Lead Model: 5076
MDC IDC LEAD LOCATION: 753858
MDC IDC LEAD LOCATION: 753860
MDC IDC PG IMPLANT DT: 20160413
MDC IDC SESS DTM: 20190326154256

## 2017-05-25 DIAGNOSIS — I482 Chronic atrial fibrillation: Secondary | ICD-10-CM | POA: Diagnosis not present

## 2017-05-25 DIAGNOSIS — Z7901 Long term (current) use of anticoagulants: Secondary | ICD-10-CM | POA: Diagnosis not present

## 2017-06-19 ENCOUNTER — Ambulatory Visit (INDEPENDENT_AMBULATORY_CARE_PROVIDER_SITE_OTHER): Payer: PPO

## 2017-06-19 DIAGNOSIS — Z95810 Presence of automatic (implantable) cardiac defibrillator: Secondary | ICD-10-CM

## 2017-06-19 DIAGNOSIS — I5022 Chronic systolic (congestive) heart failure: Secondary | ICD-10-CM

## 2017-06-20 NOTE — Progress Notes (Signed)
Patient returned call.  She said she is doing well and denied fluid symptoms.  Weight is stable at 128 lbs.  No changes today and transmission reviewed. Encouraged to call for fluid symptoms.

## 2017-06-20 NOTE — Progress Notes (Signed)
EPIC Encounter for ICM Monitoring  Patient Name: Emily [name] is a 67 y.o. female Date: 06/20/2017 Primary Care Physican: Celene Squibb, MD Primary Cardiologist:Koneswaran Electrophysiologist:Taylor Dry Weight: 129lbs (office weight 05/18/2017) Bi-V Pacing:93.4%      Clinical Status (18-May-2017 to 19-Jun-2017) Treated VT/VF 0 episodes  AT/AF 1 episode  Time in AT/AF <0.1 hr/day (<0.1%)      Attempted call to patient and unable to reach.  Left detailed message regarding transmission.  Transmission reviewed.    Thoracic impedance normal.  Prescribed dosage: Furosemide40 mgtwice daily.Dr Vickey Huger her(04/10/17 office visit)to resume 80 mg twice daily should she gain 3 pounds in 24 hours or have increasing symptoms of chest pain and/or leg swelling.   Labs: 01/24/2017 Creatinine 2.07, BUN 36, Sodium 140. EGFR 24-28  Recommendations: Left voice mail with ICM number and encouraged to call if experiencing any fluid symptoms.  Follow-up plan: ICM clinic phone appointment on 07/20/2017.    Copy of ICM check sent to Dr. Lovena Le.   3 month ICM trend: 06/20/2017    1 Year ICM trend:       [name], RN 06/20/2017 12:32 PM

## 2017-07-05 ENCOUNTER — Telehealth: Payer: Self-pay | Admitting: Cardiovascular Disease

## 2017-07-05 NOTE — Telephone Encounter (Signed)
That would be fine 

## 2017-07-05 NOTE — Telephone Encounter (Signed)
Will forward to Dr. Koneswaran to advise.  

## 2017-07-05 NOTE — Telephone Encounter (Signed)
Pt wants to make sure it's ok for her to take Osteo Bi Flex, was told by Dr. Nevada Crane it would not mess with her heart, she just wants to confirm w/ Dr. Raliegh Ip.

## 2017-07-05 NOTE — Telephone Encounter (Signed)
Pt notified. She voiced understanding.

## 2017-07-12 DIAGNOSIS — R635 Abnormal weight gain: Secondary | ICD-10-CM | POA: Diagnosis not present

## 2017-07-12 DIAGNOSIS — Z6823 Body mass index (BMI) 23.0-23.9, adult: Secondary | ICD-10-CM | POA: Diagnosis not present

## 2017-07-12 DIAGNOSIS — I482 Chronic atrial fibrillation: Secondary | ICD-10-CM | POA: Diagnosis not present

## 2017-07-12 DIAGNOSIS — M25562 Pain in left knee: Secondary | ICD-10-CM | POA: Diagnosis not present

## 2017-07-13 ENCOUNTER — Telehealth: Payer: Self-pay

## 2017-07-13 NOTE — Telephone Encounter (Signed)
Returned patient call as requested by voice mail message.  Patient stated her monitor started flashing all the lights twice during the night and she unplugged it. Provided [employer] service tech number and she will call today to get them to assist with trouble shooting.

## 2017-07-20 ENCOUNTER — Ambulatory Visit (INDEPENDENT_AMBULATORY_CARE_PROVIDER_SITE_OTHER): Payer: PPO | Admitting: *Deleted

## 2017-07-20 ENCOUNTER — Ambulatory Visit (INDEPENDENT_AMBULATORY_CARE_PROVIDER_SITE_OTHER): Payer: PPO

## 2017-07-20 DIAGNOSIS — I5022 Chronic systolic (congestive) heart failure: Secondary | ICD-10-CM | POA: Diagnosis not present

## 2017-07-20 DIAGNOSIS — Z95810 Presence of automatic (implantable) cardiac defibrillator: Secondary | ICD-10-CM | POA: Diagnosis not present

## 2017-07-20 DIAGNOSIS — I4891 Unspecified atrial fibrillation: Secondary | ICD-10-CM

## 2017-07-20 NOTE — Progress Notes (Signed)
EPIC Encounter for ICM Monitoring  Patient Name: Emily [name] is a 67 y.o. female Date: 07/20/2017 Primary Care Physican: Celene Squibb, MD Primary Cardiologist:Koneswaran Electrophysiologist:Taylor Dry Weight: 129lbs (office weight 05/18/2017) Bi-V Pacing:91.8%  Since 19-Jun-2017  AT/AF 39   Monitored VT (133-150 bpm) 1   VT-NS (>4 beats, >150 bpm) 15   AT/AF  33 Episodes  Time in AT/AF 0.1 hr/day (0.5%)   Longest AT/AF 16 minutes  OBSERVATIONS (2)   1 monitored VT episodes, longest was 84 sec.   Ventricular sensing episodes averaged 8.2 min/day since the last session.  Therapy Summary VT/VF AT/AF  Pace-Terminated Episodes 0 19 of 39 (48.7%)  Shock-Terminated Episodes 0 0  Total Shocks 0 0  Aborted Charges 0 0    Attempted call to patient and unable to reach.  Left detailed message, per DPR, regarding transmission.  Transmission reviewed.    Thoracic impedance normal.  Prescribed dosage: Furosemide40 mgtwice daily.Dr Vickey Huger her(04/10/17 office visit)to resume 80 mg twice daily should she gain 3 pounds in 24 hours or have increasing symptoms of chest pain and/or leg swelling.   Labs: 01/24/2017 Creatinine 2.07, BUN 36, Sodium 140. EGFR 24-28  Recommendations:  Left voice mail with ICM number and encouraged to call if experiencing any fluid symptoms.  Follow-up plan: ICM clinic phone appointment on 08/21/2017.    Copy of ICM check sent to Dr. Lovena Le.   3 month ICM trend: 07/20/2017    AT/AF   1 Year ICM trend:       [name], RN 07/20/2017 4:37 PM

## 2017-07-21 NOTE — Progress Notes (Signed)
Remote ICD transmission.   

## 2017-08-01 ENCOUNTER — Telehealth: Payer: Self-pay

## 2017-08-01 NOTE — Telephone Encounter (Signed)
Call to patient as requested by voice mail message.  She reported she has been having palpitations since Saturday and her heart feels like it racing even at rest.  She does not have any other symptoms but the heart racing is bothering her.  Advised to send remote transmission for review.  She is taking her medication as prescribed.  She has been taking Metoprolol 100 mg twice a day x 3 days which she had been instructed to do by her previous physician in [place].  She is a new patient to Dr Lovena Le.  Advised I will send to the device clinic for review with Dr Lovena Le if needed.   Remote transmission arrived.

## 2017-08-01 NOTE — Telephone Encounter (Signed)
Spoke with pt informed her that I had reviewed the transmission with Dr. Lovena Le, and that her heart rates were pretty well controlled, informed pt that Dr. Lovena Le had advised she just take 1 Toprol XL once a day and that to f/u with AF clinic next week, pt voiced understanding and agreed to apt with AF clinic on 6/12 at 8:30am, informed pt that information about the AF clinic would be mailed to her also gave pt phone number to the AF clinic,

## 2017-08-03 ENCOUNTER — Telehealth: Payer: Self-pay

## 2017-08-03 NOTE — Telephone Encounter (Signed)
Returned patient call as requested by voice mail message asking where to go for the afib clinic.  Spoke with patient and she said she had already talked to Dr Court Joy office and they are mailing her a packet with all the information. She had no further questions.

## 2017-08-04 DIAGNOSIS — E1122 Type 2 diabetes mellitus with diabetic chronic kidney disease: Secondary | ICD-10-CM | POA: Diagnosis not present

## 2017-08-04 DIAGNOSIS — E119 Type 2 diabetes mellitus without complications: Secondary | ICD-10-CM | POA: Diagnosis not present

## 2017-08-04 DIAGNOSIS — I482 Chronic atrial fibrillation: Secondary | ICD-10-CM | POA: Diagnosis not present

## 2017-08-04 DIAGNOSIS — M25562 Pain in left knee: Secondary | ICD-10-CM | POA: Diagnosis not present

## 2017-08-04 DIAGNOSIS — I4891 Unspecified atrial fibrillation: Secondary | ICD-10-CM | POA: Diagnosis not present

## 2017-08-04 DIAGNOSIS — I509 Heart failure, unspecified: Secondary | ICD-10-CM | POA: Diagnosis not present

## 2017-08-04 DIAGNOSIS — N184 Chronic kidney disease, stage 4 (severe): Secondary | ICD-10-CM | POA: Diagnosis not present

## 2017-08-04 DIAGNOSIS — E039 Hypothyroidism, unspecified: Secondary | ICD-10-CM | POA: Diagnosis not present

## 2017-08-04 DIAGNOSIS — E782 Mixed hyperlipidemia: Secondary | ICD-10-CM | POA: Diagnosis not present

## 2017-08-04 DIAGNOSIS — Z6823 Body mass index (BMI) 23.0-23.9, adult: Secondary | ICD-10-CM | POA: Diagnosis not present

## 2017-08-04 DIAGNOSIS — I10 Essential (primary) hypertension: Secondary | ICD-10-CM | POA: Diagnosis not present

## 2017-08-05 DIAGNOSIS — I4891 Unspecified atrial fibrillation: Secondary | ICD-10-CM | POA: Diagnosis not present

## 2017-08-06 ENCOUNTER — Other Ambulatory Visit: Payer: Self-pay

## 2017-08-06 ENCOUNTER — Inpatient Hospital Stay (HOSPITAL_COMMUNITY)
Admission: EM | Admit: 2017-08-06 | Discharge: 2017-08-09 | DRG: 273 | Disposition: A | Discharge status: Home / Self Care | Payer: PPO | Attending: Hematology and Oncology | Admitting: Hematology and Oncology

## 2017-08-06 ENCOUNTER — Emergency Department (HOSPITAL_COMMUNITY): Payer: PPO

## 2017-08-06 ENCOUNTER — Encounter (HOSPITAL_COMMUNITY): Payer: Self-pay

## 2017-08-06 DIAGNOSIS — Z888 Allergy status to other drugs, medicaments and biological substances status: Secondary | ICD-10-CM

## 2017-08-06 DIAGNOSIS — I130 Hypertensive heart and chronic kidney disease with heart failure and stage 1 through stage 4 chronic kidney disease, or unspecified chronic kidney disease: Secondary | ICD-10-CM | POA: Diagnosis present

## 2017-08-06 DIAGNOSIS — E032 Hypothyroidism due to medicaments and other exogenous substances: Secondary | ICD-10-CM | POA: Diagnosis not present

## 2017-08-06 DIAGNOSIS — N184 Chronic kidney disease, stage 4 (severe): Secondary | ICD-10-CM | POA: Diagnosis not present

## 2017-08-06 DIAGNOSIS — Z794 Long term (current) use of insulin: Secondary | ICD-10-CM | POA: Diagnosis not present

## 2017-08-06 DIAGNOSIS — I420 Dilated cardiomyopathy: Secondary | ICD-10-CM | POA: Diagnosis present

## 2017-08-06 DIAGNOSIS — I480 Paroxysmal atrial fibrillation: Secondary | ICD-10-CM | POA: Diagnosis present

## 2017-08-06 DIAGNOSIS — I361 Nonrheumatic tricuspid (valve) insufficiency: Secondary | ICD-10-CM | POA: Diagnosis not present

## 2017-08-06 DIAGNOSIS — Z95810 Presence of automatic (implantable) cardiac defibrillator: Secondary | ICD-10-CM

## 2017-08-06 DIAGNOSIS — Z885 Allergy status to narcotic agent status: Secondary | ICD-10-CM

## 2017-08-06 DIAGNOSIS — I700 Atherosclerosis of aorta: Secondary | ICD-10-CM | POA: Diagnosis not present

## 2017-08-06 DIAGNOSIS — I481 Persistent atrial fibrillation: Secondary | ICD-10-CM | POA: Diagnosis not present

## 2017-08-06 DIAGNOSIS — I428 Other cardiomyopathies: Secondary | ICD-10-CM

## 2017-08-06 DIAGNOSIS — E871 Hypo-osmolality and hyponatremia: Secondary | ICD-10-CM | POA: Diagnosis present

## 2017-08-06 DIAGNOSIS — I5023 Acute on chronic systolic (congestive) heart failure: Secondary | ICD-10-CM | POA: Diagnosis present

## 2017-08-06 DIAGNOSIS — I340 Nonrheumatic mitral (valve) insufficiency: Secondary | ICD-10-CM | POA: Diagnosis not present

## 2017-08-06 DIAGNOSIS — Z9049 Acquired absence of other specified parts of digestive tract: Secondary | ICD-10-CM | POA: Diagnosis not present

## 2017-08-06 DIAGNOSIS — E785 Hyperlipidemia, unspecified: Secondary | ICD-10-CM | POA: Diagnosis not present

## 2017-08-06 DIAGNOSIS — Z79899 Other long term (current) drug therapy: Secondary | ICD-10-CM | POA: Diagnosis not present

## 2017-08-06 DIAGNOSIS — E039 Hypothyroidism, unspecified: Secondary | ICD-10-CM | POA: Diagnosis not present

## 2017-08-06 DIAGNOSIS — I4891 Unspecified atrial fibrillation: Secondary | ICD-10-CM | POA: Diagnosis not present

## 2017-08-06 DIAGNOSIS — E1122 Type 2 diabetes mellitus with diabetic chronic kidney disease: Secondary | ICD-10-CM | POA: Diagnosis not present

## 2017-08-06 DIAGNOSIS — Z7901 Long term (current) use of anticoagulants: Secondary | ICD-10-CM | POA: Diagnosis not present

## 2017-08-06 DIAGNOSIS — I959 Hypotension, unspecified: Secondary | ICD-10-CM | POA: Diagnosis present

## 2017-08-06 DIAGNOSIS — Z90710 Acquired absence of both cervix and uterus: Secondary | ICD-10-CM

## 2017-08-06 DIAGNOSIS — R112 Nausea with vomiting, unspecified: Secondary | ICD-10-CM | POA: Diagnosis present

## 2017-08-06 DIAGNOSIS — R0602 Shortness of breath: Secondary | ICD-10-CM | POA: Diagnosis not present

## 2017-08-06 LAB — URINALYSIS, ROUTINE W REFLEX MICROSCOPIC
BILIRUBIN URINE: NEGATIVE
Glucose, UA: NEGATIVE mg/dL
HGB URINE DIPSTICK: NEGATIVE
Ketones, ur: NEGATIVE mg/dL
Leukocytes, UA: NEGATIVE
NITRITE: NEGATIVE
PROTEIN: NEGATIVE mg/dL
Specific Gravity, Urine: 1.011 (ref 1.005–1.030)
pH: 5 (ref 5.0–8.0)

## 2017-08-06 LAB — BASIC METABOLIC PANEL
ANION GAP: 12 (ref 5–15)
BUN: 46 mg/dL — ABNORMAL HIGH (ref 6–20)
CALCIUM: 8.2 mg/dL — AB (ref 8.9–10.3)
CO2: 18 mmol/L — AB (ref 22–32)
Chloride: 99 mmol/L — ABNORMAL LOW (ref 101–111)
Creatinine, Ser: 3.06 mg/dL — ABNORMAL HIGH (ref 0.44–1.00)
GFR, EST AFRICAN AMERICAN: 17 mL/min — AB (ref >60)
GFR, EST NON AFRICAN AMERICAN: 15 mL/min — AB (ref >60)
GLUCOSE: 220 mg/dL — AB (ref 65–99)
Potassium: 4.5 mmol/L (ref 3.5–5.1)
Sodium: 129 mmol/L — ABNORMAL LOW (ref 135–145)

## 2017-08-06 LAB — I-STAT TROPONIN, ED: TROPONIN I, POC: 0.03 ng/mL (ref 0.00–0.08)

## 2017-08-06 LAB — HEPATIC FUNCTION PANEL
ALT: 17 U/L (ref 14–54)
AST: 19 U/L (ref 15–41)
Albumin: 3.6 g/dL (ref 3.5–5.0)
Alkaline Phosphatase: 77 U/L (ref 38–126)
BILIRUBIN DIRECT: 0.5 mg/dL (ref 0.1–0.5)
BILIRUBIN INDIRECT: 1.4 mg/dL — AB (ref 0.3–0.9)
BILIRUBIN TOTAL: 1.9 mg/dL — AB (ref 0.3–1.2)
Total Protein: 7.4 g/dL (ref 6.5–8.1)

## 2017-08-06 LAB — PROTIME-INR
INR: 2.24
PROTHROMBIN TIME: 24.6 s — AB (ref 11.4–15.2)

## 2017-08-06 LAB — CBC
HCT: 37.7 % (ref 36.0–46.0)
HEMOGLOBIN: 12.4 g/dL (ref 12.0–15.0)
MCH: 31.6 pg (ref 26.0–34.0)
MCHC: 32.9 g/dL (ref 30.0–36.0)
MCV: 95.9 fL (ref 78.0–100.0)
Platelets: 250 10*3/uL (ref 150–400)
RBC: 3.93 MIL/uL (ref 3.87–5.11)
RDW: 13.6 % (ref 11.5–15.5)
WBC: 12.6 10*3/uL — ABNORMAL HIGH (ref 4.0–10.5)

## 2017-08-06 LAB — HEMOGLOBIN A1C
Hgb A1c MFr Bld: 8.3 % — ABNORMAL HIGH (ref 4.8–5.6)
Mean Plasma Glucose: 191.51 mg/dL

## 2017-08-06 LAB — BRAIN NATRIURETIC PEPTIDE: B NATRIURETIC PEPTIDE 5: 846.2 pg/mL — AB (ref 0.0–100.0)

## 2017-08-06 LAB — LIPASE, BLOOD: Lipase: 49 U/L (ref 11–51)

## 2017-08-06 LAB — TSH: TSH: 18.926 u[IU]/mL — ABNORMAL HIGH (ref 0.350–4.500)

## 2017-08-06 MED ORDER — ONDANSETRON HCL 4 MG/2ML IJ SOLN
4.0000 mg | Freq: Three times a day (TID) | INTRAMUSCULAR | Status: DC | PRN
  Administered 2017-08-06: 4 mg via INTRAVENOUS

## 2017-08-06 MED ORDER — ONDANSETRON HCL 4 MG/2ML IJ SOLN
INTRAMUSCULAR | Status: AC
  Filled 2017-08-06: qty 2

## 2017-08-06 MED ORDER — DEXTROSE 5 % IV SOLN
5.0000 mg/h | INTRAVENOUS | Status: DC
  Administered 2017-08-06: 5 mg/h via INTRAVENOUS
  Filled 2017-08-06: qty 100

## 2017-08-06 MED ORDER — LEVOTHYROXINE SODIUM 50 MCG PO TABS: 50.0000 ug | ORAL_TABLET | Freq: Every day | ORAL | Status: DC

## 2017-08-06 MED ORDER — INSULIN ASPART 100 UNIT/ML ~~LOC~~ SOLN
0.0000 [IU] | Freq: Three times a day (TID) | SUBCUTANEOUS | Status: DC
  Administered 2017-08-07: 1 [IU] via SUBCUTANEOUS
  Administered 2017-08-07: 5 [IU] via SUBCUTANEOUS
  Administered 2017-08-07: 3 [IU] via SUBCUTANEOUS
  Administered 2017-08-08: 2 [IU] via SUBCUTANEOUS
  Administered 2017-08-09: 1 [IU] via SUBCUTANEOUS
  Filled 2017-08-06 (×2): qty 1

## 2017-08-06 MED ORDER — WARFARIN SODIUM 2 MG PO TABS
2.0000 mg | ORAL_TABLET | ORAL | Status: DC
  Administered 2017-08-07 – 2017-08-08 (×2): 2 mg via ORAL
  Filled 2017-08-06 (×4): qty 1

## 2017-08-06 MED ORDER — METOPROLOL SUCCINATE ER 100 MG PO TB24
100.0000 mg | ORAL_TABLET | Freq: Every day | ORAL | Status: DC
  Administered 2017-08-08 – 2017-08-09 (×2): 100 mg via ORAL
  Filled 2017-08-06 (×3): qty 1

## 2017-08-06 MED ORDER — WARFARIN SODIUM 4 MG PO TABS
4.0000 mg | ORAL_TABLET | ORAL | Status: DC
  Administered 2017-08-07: 4 mg via ORAL
  Filled 2017-08-06: qty 1

## 2017-08-06 MED ORDER — ACETAMINOPHEN 500 MG PO TABS: 1000.0000 mg | ORAL_TABLET | Freq: Four times a day (QID) | ORAL | Status: DC | PRN

## 2017-08-06 MED ORDER — PROPOFOL 10 MG/ML IV BOLUS
0.5000 mg/kg | Freq: Once | INTRAVENOUS | Status: DC
  Filled 2017-08-06: qty 20

## 2017-08-06 MED ORDER — DILTIAZEM LOAD VIA INFUSION
10.0000 mg | Freq: Once | INTRAVENOUS | Status: AC
  Administered 2017-08-06: 10 mg via INTRAVENOUS
  Filled 2017-08-06: qty 10

## 2017-08-06 MED ORDER — FUROSEMIDE 10 MG/ML IJ SOLN
80.0000 mg | Freq: Once | INTRAMUSCULAR | Status: AC
  Administered 2017-08-06: 80 mg via INTRAVENOUS
  Filled 2017-08-06: qty 8

## 2017-08-06 MED ORDER — WARFARIN - PHYSICIAN DOSING INPATIENT: Freq: Every day | Status: DC

## 2017-08-06 MED ORDER — ONDANSETRON HCL 4 MG/2ML IJ SOLN: 4.0000 mg | Freq: Four times a day (QID) | INTRAMUSCULAR | Status: DC | PRN

## 2017-08-06 MED ORDER — SODIUM CHLORIDE 0.9 % IV SOLN
INTRAVENOUS | Status: AC | PRN
  Administered 2017-08-06: 10 mL/h via INTRAVENOUS

## 2017-08-06 MED ORDER — PANTOPRAZOLE SODIUM 40 MG PO TBEC: 40.0000 mg | DELAYED_RELEASE_TABLET | Freq: Every day | ORAL | Status: DC

## 2017-08-06 MED ORDER — ONDANSETRON HCL 4 MG/2ML IJ SOLN
4.0000 mg | Freq: Once | INTRAMUSCULAR | Status: AC
  Administered 2017-08-06: 4 mg via INTRAVENOUS

## 2017-08-06 MED ORDER — SIMVASTATIN 20 MG PO TABS
20.0000 mg | ORAL_TABLET | Freq: Every day | ORAL | Status: DC
  Administered 2017-08-07 – 2017-08-08 (×2): 20 mg via ORAL
  Filled 2017-08-06 (×3): qty 1

## 2017-08-06 MED ORDER — WARFARIN SODIUM 2 MG PO TABS: 2.0000 mg | ORAL_TABLET | ORAL | Status: DC

## 2017-08-06 MED ORDER — PROPOFOL 10 MG/ML IV BOLUS
INTRAVENOUS | Status: AC | PRN
  Administered 2017-08-06: 40 mg via INTRAVENOUS

## 2017-08-06 NOTE — Consult Note (Signed)
ELECTROPHYSIOLOGY CONSULT NOTE  Patient ID: Emily [name], MRN: [id number], DOB/AGE: 03/08/1950 66 y.o. Admit date: 08/06/2017 Date of Consult: 08/06/2017  Primary Physician: Celene Squibb, MD Primary Cardiologist: [facility] Electrophysiologist GT  [name] is a 67 y.o. female who is being seen today for the evaluation of Atrial fib at the request of Dr Ralene Bathe.   Chief Complaint: SOB   HPI Emily [name] is a 67 y.o. female  With a long-standing history of a nonischemic cardiomyopathy initially identified back about 15 years ago.  She is status post ICD with CRT. She has had recurrent problems with atrial fibrillation and is undergone AF ablation 4/18 done [facility].  She is most recently cardioverted about a year ago.  Is her impression she redeveloped atrial fibrillation about a week ago manifested by tachypalpitations and increasing fatigue and dyspnea.  This is confirmed by device interrogation today.  She does not have peripheral edema nocturnal dyspnea orthopnea.  She takes warfarin.  Alternative anticoagulants have not been reviewed with her.  She has a history of pericardial windows in the past.  DATE TEST EF   6/18 Echo   20-25 %   7/18 Myoview  31 % Perfusion abnl-fixed ? MI            Thromboembolic risk factors ( age -17, HTN-1, DM-1, Vasc disease -1, CHF-1, Gender-1) for a CHADSVASc Score of >=6   Past Medical History:  Diagnosis Date  . Atrial fibrillation ([place])   . DM (diabetes mellitus) ([place])   . HTN (hypertension)       Surgical History:  Past Surgical History:  Procedure Laterality Date  . ABLATION    . BREAST BIOPSY    . CARDIAC DEFIBRILLATOR PLACEMENT  2005  . CATARACT EXTRACTION Bilateral   . CHOLECYSTECTOMY    . THYROIDECTOMY    . TOTAL VAGINAL HYSTERECTOMY       Home Meds: Prior to Admission medications   Medication Sig Start Date End Date Taking? Authorizing Provider  acetaminophen (TYLENOL) 500 MG tablet Take 1,000 mg by mouth  every 6 (six) hours as needed for mild pain.   Yes [provider]  Biotin 1000 MCG tablet Take 1,000 mcg by mouth daily.    Yes [provider]  Calcium Carb-Cholecalciferol (CALCIUM 600-D PO) Take 1 tablet by mouth daily.    Yes [provider]  furosemide (LASIX) 40 MG tablet Take 1 tablet (40 mg total) by mouth 2 (two) times daily. 04/10/17 08/06/17 Yes Herminio Commons, MD  Glucosamine-Chondroitin (OSTEO BI-FLEX REGULAR STRENGTH PO) Take 1 tablet by mouth daily.   Yes [provider]  Insulin Glargine 300 UNIT/ML SOPN Inject 24 Units into the skin at bedtime.  08/31/16  Yes [provider]  insulin lispro (HUMALOG) 100 UNIT/ML KiwkPen Inject 6 Units into the skin. Take before dinner 08/31/16  Yes [provider]  levothyroxine (SYNTHROID, LEVOTHROID) 50 MCG tablet Take 50 mcg by mouth daily before breakfast.    Yes [provider]  metoprolol succinate (TOPROL-XL) 100 MG 24 hr tablet TAKE 1 TABLET ONCE DAILY. 11/29/16  Yes [provider]  Omega-3 Fatty Acids (FISH OIL PO) Take 1,000 mg by mouth daily.    Yes [provider]  omeprazole (PRILOSEC) 40 MG capsule Take 40 mg by mouth daily.    Yes [provider]  potassium chloride SA (K-DUR,KLOR-CON) 20 MEQ tablet Take 20 mEq by mouth every other day.  10/18/16  Yes [provider]  simvastatin (ZOCOR) 20 MG tablet TAKE 1 TABLET BY MOUTH ONCE DAILY AT 6 PM. 05/15/17  Yes Herminio Commons, MD  warfarin (COUMADIN) 2 MG tablet Take 1 tablet on Monday, Wednesday and Friday. Take 2 tablets(4 mg) on  all other days.  As directed by physician to maintain INR level. Patient taking differently: Take 2-4 mg by mouth See admin instructions. 2mg  on Monday, Tuesday, Wednesday, Friday and 4 mg on Sunday, Thursday, Saturday.  As directed by physician to maintain INR level. 04/03/17  Yes Herminio Commons, MD      Allergies:  Allergies  Allergen Reactions  .  Amiodarone Rash  . Codeine Other (See Comments)    Euphoria, hallucinations  . Other Palpitations    Other reaction(s): Other (See Comments) PT STATES SHE GETS REALLY HOT , DIZZY HEADED, HAS PASSED OUT TWICE , AND ITCHING WITH IV DYE Tumeric  . Iodine Rash    topical    Social History   Socioeconomic History  . Marital status: Married    Spouse name: Not on file  . Number of children: Not on file  . Years of education: Not on file  . Highest education level: Not on file  Occupational History  . Not on file  Social Needs  . Financial resource strain: Not on file  . Food insecurity:    Worry: Not on file    Inability: Not on file  . Transportation needs:    Medical: Not on file    Non-medical: Not on file  Tobacco Use  . Smoking status: Never Smoker  . Smokeless tobacco: Never Used  Substance and Sexual Activity  . Alcohol use: No  . Drug use: No  . Sexual activity: Not on file  Lifestyle  . Physical activity:    Days per week: Not on file    Minutes per session: Not on file  . Stress: Not on file  Relationships  . Social connections:    Talks on phone: Not on file    Gets together: Not on file    Attends religious service: Not on file    Active member of club or organization: Not on file    Attends meetings of clubs or organizations: Not on file    Relationship status: Not on file  . Intimate partner violence:    Fear of current or ex partner: Not on file    Emotionally abused: Not on file    Physically abused: Not on file    Forced sexual activity: Not on file  Other Topics Concern  . Not on file  Social History Narrative  . Not on file     Family History  Problem Relation Age of Onset  . Hip fracture Mother   . COPD Father   . CAD Sister   . Heart attack Sister      ROS:  Please see the history of present illness.     All other systems reviewed and negative.    Physical Exam: Blood pressure 109/76, pulse (!) 112, temperature 97.6 F (36.4 C),  temperature source Oral, resp. rate (!) 31, height 5\' 3"  (1.6 m), weight 135 lb (61.2 kg), SpO2 100 %. General: Well developed, well nourished female in no acute distress. Head: Normocephalic, atraumatic, sclera non-icteric, no xanthomas, nares are without discharge. EENT: normal Lymph Nodes:  none Back: without scoliosis/kyphosis , no CVA tendersness Neck: Negative for carotid bruits. JVD 8-10 Lungs: Clear bilaterally to auscultation without wheezes, rales, or  rhonchi. Breathing is unlabored. Heart: Irregularly irregular rate and rhythm with a 2 /6 systolic  murmur , rubs, or gallops appreciated. Abdomen: Soft, non-tender, non-distended with normoactive bowel sounds. No hepatomegaly. No rebound/guarding. No obvious abdominal masses. Msk:  Strength and tone appear normal for age. Extremities: No clubbing or cyanosis. No edema.  Distal pedal pulses are 2+ and equal bilaterally. Skin: Warm and Dry Neuro: Alert and oriented X 3. CN III-XII intact Grossly normal sensory and motor function . Psych:  Responds to questions appropriately with a normal affect.      Labs: Cardiac Enzymes No results for input(s): CKTOTAL, CKMB, TROPONINI in the last 72 hours. CBC Lab Results  Component Value Date   WBC 12.6 (H) 08/06/2017   HGB 12.4 08/06/2017   HCT 37.7 08/06/2017   MCV 95.9 08/06/2017   PLT 250 08/06/2017   PROTIME: Recent Labs    08/06/17 1004  LABPROT 24.6*  INR 2.24   Chemistry  Recent Labs  Lab 08/06/17 1004  NA 129*  K 4.5  CL 99*  CO2 18*  BUN 46*  CREATININE 3.06*  CALCIUM 8.2*  GLUCOSE 220*   Lipids No results found for: CHOL, HDL, LDLCALC, TRIG BNP No results found for: PROBNP Thyroid Function Tests: No results for input(s): TSH, T4TOTAL, T3FREE, THYROIDAB in the last 72 hours.  Invalid input(s): FREET3    Miscellaneous No results found for: DDIMER  Radiology/Studies:  Dg Chest 2 View  Result Date: 08/06/2017 CLINICAL DATA:  Shortness of breath with  cardiac arrhythmia EXAM: CHEST - 2 VIEW COMPARISON:  April 24, 2003 FINDINGS: There is a small left pleural effusion. There is no edema or consolidation. There is apparent nipple shadow on the right. Heart is mildly enlarged with pulmonary vascularity normal. Pacemaker leads are attached the right atrium, right ventricle, coronary sinus. There is aortic atherosclerosis. No adenopathy. No evident bone lesions. IMPRESSION: Small left pleural effusion.  No edema or consolidation. Cardiac prominence, stable. Pacemaker leads as described. There is aortic atherosclerosis. Aortic Atherosclerosis (ICD10-I70.0). Electronically Signed   By: Lowella Grip III M.D.   On: 08/06/2017 10:35    EKG: atrial fibrillation with rVR  Device   Personally reviewed  interrogation was reviewed.  Atrial fibrillation persisting x1 week.  ECG 2/19 demonstrated AV pacing with a negative QRS 1 and a QR in lead V1 Assessment and Plan:  Atrial fibrillation-persistent  Congestive heart failure acute/chronic/systolic class III  Cardiomyopathy-nonischemic  Renal insufficiency grade 4  Implantable defibrillator-CRT-Medtronic  Treated hypothyroidism  The patient has persistent atrial fibrillation.  She was last cardioverted about a year ago.  Her symptoms clearly track with her atrial fibrillation and just prior to her atrial fibrillation she was doing quite well.  Repeat cardioversion at this time seems most expeditious.  She is anticoagulated.  INR is therapeutic.  Renal insufficiency is quite variable.  Over the last 5 years her creatinine is ranged from as high as 4 to as low as 1.8.  The last year it has been in the 2.5 range.  Outpatient follow-up will be recommended.  Long-term strategies for atrial fibrillation will depend on the frequency of recurrence.  She has had a prior ablation in [place].  A repeat ablation might be of some value given her young age.  She may also benefit from AV nodal ablation.   However, if the frequency is about once a year, intermittent cardioversion seems to be a reasonable strategy.  With her cardiomyopathy, following restoration of sinus rhythm, and  given her renal disease, hydralazine/nitrates would be indicated this can be initiated as an outpatient.  There is also question in her mind as to the management of her hypothyroidism with initial high dose replacement and gradual down titration.  TSH is pending at this time.   Virl Axe

## 2017-08-06 NOTE — ED Provider Notes (Addendum)
[facility] EMERGENCY DEPARTMENT Provider Note   CSN: [id number] Arrival date & time: 08/06/17  4401     History   Chief Complaint No chief complaint on file.   HPI Emily [name] is a 67 y.o. female.  The history is provided by the patient. No language interpreter was used.   Emily [name] is a 68 y.o. female who presents to the Emergency Department complaining of sob, afib. She presents for evaluation of one week of shortness of breath and sensation that she is in a fib. She endorses central abdominal discomfort with frequent belching and nausea. She has significant dyspnea on exertion. She has a follow-up with a fib clinic scheduled for next week but is to symptomatic and presented for evaluation. She does endorse significant generalized weakness as well. No fevers, chest pain, vomiting, diarrhea, dysuria or difficulty urinating. Symptoms are moderate to severe, constant and worsening. She does endorse weight gain over the last week of at least 1 pound per day. Past Medical History:  Diagnosis Date  . Atrial fibrillation ([place])   . DM (diabetes mellitus) ([place])   . HTN (hypertension)     Patient Active Problem List   Diagnosis Date Noted  . Acute on chronic systolic heart failure ([place]) 12/29/2016  . AICD (automatic cardioverter/defibrillator) present 12/29/2016  . Anemia 12/29/2016  . CKD (chronic kidney disease) stage 4, GFR 15-29 ml/min ([facility]) 12/29/2016  . IDDM (insulin dependent diabetes mellitus) ([place]) 12/29/2016  . 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 Hypercholesteremia 12/29/2016  . Hypertension, well controlled 12/29/2016  . Hypothyroidism 12/29/2016  . 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 ABLATION    . BREAST BIOPSY    . CARDIAC DEFIBRILLATOR PLACEMENT  2005  . CATARACT EXTRACTION Bilateral   . CHOLECYSTECTOMY    . THYROIDECTOMY      . TOTAL VAGINAL HYSTERECTOMY       OB History   None      Home Medications    Prior to Admission medications   Medication Sig Start Date End Date Taking? Authorizing Provider  acetaminophen (TYLENOL) 500 MG tablet Take 1,000 mg by mouth every 6 (six) hours as needed for mild pain.   Yes [provider]  Biotin 1000 MCG tablet Take 1,000 mcg by mouth daily.    Yes [provider]  Calcium Carb-Cholecalciferol (CALCIUM 600-D PO) Take 1 tablet by mouth daily.    Yes [provider]  furosemide (LASIX) 40 MG tablet Take 1 tablet (40 mg total) by mouth 2 (two) times daily. 04/10/17 08/06/17 Yes Herminio Commons, MD  Glucosamine-Chondroitin (OSTEO BI-FLEX REGULAR STRENGTH PO) Take 1 tablet by mouth daily.   Yes [provider]  Insulin Glargine 300 UNIT/ML SOPN Inject 24 Units into the skin at bedtime.  08/31/16  Yes [provider]  insulin lispro (HUMALOG) 100 UNIT/ML KiwkPen Inject 6 Units into the skin. Take before dinner 08/31/16  Yes [provider]  levothyroxine (SYNTHROID, LEVOTHROID) 50 MCG tablet Take 50 mcg by mouth daily before breakfast.    Yes [provider]  metoprolol succinate (TOPROL-XL) 100 MG 24 hr tablet TAKE 1 TABLET ONCE DAILY. 11/29/16  Yes [provider]  Omega-3 Fatty Acids (FISH OIL PO) Take 1,000 mg by mouth daily.    Yes [provider]  omeprazole (PRILOSEC) 40 MG capsule Take 40 mg by mouth daily.  Yes [provider]  potassium chloride SA (K-DUR,KLOR-CON) 20 MEQ tablet Take 20 mEq by mouth every other day.  10/18/16  Yes [provider]  simvastatin (ZOCOR) 20 MG tablet TAKE 1 TABLET BY MOUTH ONCE DAILY AT 6 PM. 05/15/17  Yes Herminio Commons, MD  warfarin (COUMADIN) 2 MG tablet Take 1 tablet on Monday, Wednesday and Friday. Take 2 tablets(4 mg) on  all other days.  As directed by physician to maintain INR level. Patient taking differently: Take 2-4 mg by mouth  See admin instructions. 2mg  on Monday, Tuesday, Wednesday, Friday and 4 mg on Sunday, Thursday, Saturday.  As directed by physician to maintain INR level. 04/03/17  Yes Herminio Commons, MD    Family History Family History  Problem Relation Age of Onset  . Hip fracture Mother   . COPD Father   . CAD Sister   . Heart attack Sister     Social History Social History   Tobacco Use  . Smoking status: Never Smoker  . Smokeless tobacco: Never Used  Substance Use Topics  . Alcohol use: No  . Drug use: No     Allergies   Amiodarone; Codeine; Other; and Iodine   Review of Systems Review of Systems  All other systems reviewed and are negative.    Physical Exam Updated Vital Signs BP 91/75   Pulse (!) 59   Temp 97.6 F (36.4 C) (Oral)   Resp (!) 0   Ht 5\' 3"  (1.6 m)   Wt 61.2 kg (135 lb)   SpO2 98%   BMI 23.91 kg/m   Physical Exam  Constitutional: She is oriented to person, place, and time. She appears well-developed and well-nourished.  HENT:  Head: Normocephalic and atraumatic.  Cardiovascular:  No murmur heard. Tachycardic and irregular  Pulmonary/Chest: Effort normal. No respiratory distress.  Decreased air movement in the right lung base  Abdominal: Soft. There is no rebound and no guarding.  Mild generalized abdominal tenderness  Musculoskeletal: She exhibits no edema or tenderness.  Neurological: She is alert and oriented to person, place, and time.  Skin: Skin is warm and dry. There is pallor.  Psychiatric: She has a normal mood and affect. Her behavior is normal.  Nursing note and vitals reviewed.    ED Treatments / Results  Labs (all labs ordered are listed, but only abnormal results are displayed) Labs Reviewed  BASIC METABOLIC PANEL - Abnormal; Notable for the following components:      Result Value   Sodium 129 (*)    Chloride 99 (*)    CO2 18 (*)    Glucose, Bld 220 (*)    BUN 46 (*)    Creatinine, Ser 3.06 (*)    Calcium 8.2 (*)     GFR calc non Af Amer 15 (*)    GFR calc Af Amer 17 (*)    All other components within normal limits  CBC - Abnormal; Notable for the following components:   WBC 12.6 (*)    All other components within normal limits  PROTIME-INR - Abnormal; Notable for the following components:   Prothrombin Time 24.6 (*)    All other components within normal limits  BRAIN NATRIURETIC PEPTIDE - Abnormal; Notable for the following components:   B Natriuretic Peptide 846.2 (*)    All other components within normal limits  HEPATIC FUNCTION PANEL - Abnormal; Notable for the following components:   Total Bilirubin 1.9 (*)    Indirect Bilirubin 1.4 (*)  All other components within normal limits  LIPASE, BLOOD  URINALYSIS, ROUTINE W REFLEX MICROSCOPIC  I-STAT TROPONIN, ED    EKG EKG Interpretation  Date/Time:  Sunday August 06 2017 17:46:54 EDT Ventricular Rate:  60 PR Interval:    QRS Duration: 131 QT Interval:  540 QTC Calculation: 540 R Axis:   110 Text Interpretation:  Sinus rhythm Nonspecific intraventricular conduction delay Lateral infarct, recent Probable anteroseptal infarct, old T wave abnormality , more pronounced compared to post-cardioversion ECG from earlier today. Abnormal ekg Confirmed by Carmin Muskrat [phone]) on 08/06/2017 5:51:22 PM   Radiology Dg Chest 2 View  Result Date: 08/06/2017 CLINICAL DATA:  Shortness of breath with cardiac arrhythmia EXAM: CHEST - 2 VIEW COMPARISON:  April 24, 2003 FINDINGS: There is a small left pleural effusion. There is no edema or consolidation. There is apparent nipple shadow on the right. Heart is mildly enlarged with pulmonary vascularity normal. Pacemaker leads are attached the right atrium, right ventricle, coronary sinus. There is aortic atherosclerosis. No adenopathy. No evident bone lesions. IMPRESSION: Small left pleural effusion.  No edema or consolidation. Cardiac prominence, stable. Pacemaker leads as described. There is aortic  atherosclerosis. Aortic Atherosclerosis (ICD10-I70.0). Electronically Signed   By: Lowella Grip III M.D.   On: 08/06/2017 10:35    Procedures .Cardioversion Date/Time: 08/06/2017 4:45 PM Performed by: Quintella Reichert, MD Authorized by: Quintella Reichert, MD   Consent:    Consent obtained:  Written   Consent given by:  Patient   Risks discussed:  Cutaneous burn, induced arrhythmia, pain and death   Alternatives discussed:  Rate-control medication Pre-procedure details:    Cardioversion basis:  Emergent   Rhythm:  Atrial fibrillation   Electrode placement:  Anterior-posterior Patient sedated: Yes. Refer to sedation procedure documentation for details of sedation.  Attempt one:    Cardioversion mode:  Synchronous   Shock (Joules):  150   Shock outcome:  Conversion to normal sinus rhythm Post-procedure details:    Patient status:  Awake   Patient tolerance of procedure:  Tolerated well, no immediate complications .Sedation Date/Time: 08/06/2017 4:46 PM Performed by: Quintella Reichert, MD Authorized by: Quintella Reichert, MD   Consent:    Consent obtained:  Verbal   Consent given by:  Patient   Risks discussed:  Allergic reaction, dysrhythmia, inadequate sedation, nausea, prolonged hypoxia resulting in organ damage, prolonged sedation necessitating reversal, respiratory compromise necessitating ventilatory assistance and intubation and vomiting   Alternatives discussed:  Analgesia without sedation, anxiolysis and regional anesthesia Universal protocol:    Procedure explained and questions answered to patient or proxy's satisfaction: yes     Relevant documents present and verified: yes     Test results available and properly labeled: yes     Imaging studies available: yes     Required blood products, implants, devices, and special equipment available: yes     Site/side marked: yes     Immediately prior to procedure a time out was called: yes     Patient identity confirmation method:   Verbally with patient Indications:    Procedure necessitating sedation performed by:  Physician performing sedation   Intended level of sedation:  Deep Pre-sedation assessment:    Time since last food or drink:  This morning   ASA classification: class 4 - patient with severe systemic disease that is a constant threat to life     Neck mobility: normal     Mouth opening:  3 or more finger widths   Thyromental distance:  4  finger widths   Mallampati score:  I - soft palate, uvula, fauces, pillars visible   Pre-sedation assessments completed and reviewed: airway patency, cardiovascular function, hydration status, mental status, nausea/vomiting, pain level, respiratory function and temperature   Immediate pre-procedure details:    Reassessment: Patient reassessed immediately prior to procedure     Reviewed: vital signs, relevant labs/tests and NPO status     Verified: bag valve mask available, emergency equipment available, intubation equipment available, IV patency confirmed, oxygen available and suction available   Procedure details (see MAR for exact dosages):    Preoxygenation:  Nasal cannula   Sedation:  Propofol   Intra-procedure monitoring:  Blood pressure monitoring, cardiac monitor, continuous pulse oximetry, frequent LOC assessments, frequent vital sign checks and continuous capnometry   Intra-procedure events: none     Total Provider sedation time (minutes):  20 Post-procedure details:    Attendance: Constant attendance by certified staff until patient recovered     Recovery: Patient returned to pre-procedure baseline     Post-sedation assessments completed and reviewed: airway patency, cardiovascular function, hydration status, mental status, nausea/vomiting, pain level, respiratory function and temperature     Patient is stable for discharge or admission: yes     Patient tolerance:  Tolerated well, no immediate complications   (including critical care time) CHA2DS2/VAS Stroke  Risk Points  Current as of 5 minutes ago     6 >= 2 Points: High Risk  1 - 1.99 Points: Medium Risk  0 Points: Low Risk    The patient's score has not changed in the past year.:  No Change     Details    This score determines the patient's risk of having a stroke if the  patient has atrial fibrillation.       Points Metrics  1 Has Congestive Heart Failure:  Yes    Current as of 5 minutes ago  1 Has Vascular Disease:  Yes     Current as of 5 minutes ago  1 Has Hypertension:  Yes    Current as of 5 minutes ago  1 Age:  60    Current as of 5 minutes ago  1 Has Diabetes:  Yes    Current as of 5 minutes ago  0 Had Stroke:  No  Had TIA:  No  Had thromboembolism:  No    Current as of 5 minutes ago  1 Female:  Yes    Current as of 5 minutes ago         CRITICAL CARE Performed by: Quintella Reichert   Total critical care time: 35 minutes  Critical care time was exclusive of separately billable procedures and treating other patients.  Critical care was necessary to treat or prevent imminent or life-threatening deterioration.  Critical care was time spent personally by me on the following activities: development of treatment plan with patient and/or surrogate as well as nursing, discussions with consultants, evaluation of patient's response to treatment, examination of patient, obtaining history from patient or surrogate, ordering and performing treatments and interventions, ordering and review of laboratory studies, ordering and review of radiographic studies, pulse oximetry and re-evaluation of patient's condition.    Medications Ordered in ED Medications  diltiazem (CARDIZEM) 1 mg/mL load via infusion 10 mg (10 mg Intravenous Bolus from Bag 08/06/17 1342)    And  diltiazem (CARDIZEM) 100 mg in dextrose 5 % 100 mL (1 mg/mL) infusion (0 mg/hr Intravenous Stopped 08/06/17 1604)  propofol (DIPRIVAN) 10 mg/mL bolus/IV push 30.6  mg (30.6 mg Intravenous Not Given 08/06/17 1649)  0.9 %  sodium  chloride infusion (10 mL/hr Intravenous New Bag/Given 08/06/17 1600)  propofol (DIPRIVAN) 10 mg/mL bolus/IV push (40 mg Intravenous Given 08/06/17 1615)     Initial Impression / Assessment and Plan / ED Course  I have reviewed the triage vital signs and the nursing notes.  Pertinent labs & imaging results that were available during my care of the patient were reviewed by me and considered in my medical decision making (see chart for details).    Pt with history of CHF, CKD here for evaluation of palpitations, shortness of breath and weakness for the last week, feels like she is in atrial fibrillation. She is an atrial fibrillation in the emergency department heart rates ranging from 90s to 120s. BMP demonstrates mild elevation in her creatinine compared to prior. No pulmonary edema on chest x-ray. Abdominal exam is benign. Cardiology consulted for recommendations and she was evaluated in the emergency department. Cardioversion performed per cardiology recommendations. Post procedure she is in sinus rhythm. Patient care transferred pending reassessment of his cardioversion.  Final Clinical Impressions(s) / ED Diagnoses   Final diagnoses:  None    ED Discharge Orders    None       Quintella Reichert, MD 08/06/17 1756    Quintella Reichert, MD 08/15/17 1158

## 2017-08-06 NOTE — ED Notes (Signed)
Pt c/o "racing heart- pounding in chest" -- EKG repeated, dr lockwood notified.

## 2017-08-06 NOTE — ED Triage Notes (Signed)
Patient complains of 1 week of ongoing palpitations and SOB. States that she has been in atrial fib the past week and taking lopressor as prescribed. No CP, alert and oriented, complains of fatigue with same

## 2017-08-06 NOTE — ED Provider Notes (Signed)
Patient's care assumed from Dr. Vashti Hey on signout. Now, on repeat exam she states that she is feeling worse instead of better, with persistent weakness, inability to ambulate.  We discussed findings, including persistent heart failure, with her daughter present. I discussed her case with the cardiologist to assisted with her care earlier today, and now,  status post cardioversion, but with persistent symptoms, hyponatremia, worsening heart failure she will receive Lasix, require admission for monitoring and management.   EKG Interpretation  Date/Time:  Sunday August 06 2017 17:46:54 EDT Ventricular Rate:  60 PR Interval:    QRS Duration: 131 QT Interval:  540 QTC Calculation: 540 R Axis:   110 Text Interpretation:  Sinus rhythm Nonspecific intraventricular conduction delay Lateral infarct, recent Probable anteroseptal infarct, old T wave abnormality , more pronounced compared to post-cardioversion ECG from earlier today. Abnormal ekg Confirmed by Carmin Muskrat [phone]) on 08/06/2017 5:51:22 PM         Carmin Muskrat, MD 08/06/17 [phone]

## 2017-08-06 NOTE — ED Notes (Signed)
This tech got all the blood except for the BMP. Triage RN notified.

## 2017-08-06 NOTE — Sedation Documentation (Signed)
Family updated as to patient's status.- now at bedside

## 2017-08-06 NOTE — ED Notes (Signed)
Pt has medtronic pacemaker-- AICD -- pt is short of breath, dyspneic with any exertion-- had called cardiologists office - has an appt on Wednesday in the [facility] clinic- cannot waait-- is too short of breath per pt. Unable to sleep last night, had to sit up all night.

## 2017-08-06 NOTE — H&P (Addendum)
[facility] Admission History and Physical Service Pager: [phone]  Patient name: Emily [name] Medical record number: [id number] Date of birth: 1950-04-12 Age: 67 y.o. Gender: female  Primary Care Provider: Celene Squibb, MD Consultants: Electrophysiology Code Status: Partial code (DNI)  Chief Complaint: shortness of breath   Assessment and Plan: Emily [name] is a 67 y.o. female presenting with shortness of breath and hyponatremia. PMH is significant for HFrEF s/p ICD, atrial fibrillation, CKD, T2DM, HLD, and hypothyroidism.    Shortness of breath, improving:  Likely a combination of atrial fibrillation and possibly HFrEF exacerbation.  Was hemodynamically unstable while in the ED and required cardioversion with return to NSR afterward.  Patient continues to have some shortness of breath.  Patient does not appear volume overloaded on exam and CXR notable only for L-sided pleural effusion without edema or vascular congestion.  However, BNP elevated to 846 (baseline is unknown).  Patient notes that she has recently gained 15 pounds but was told that this was because her synthroid dose was too low.  Patient evaluated by EP, who recommended Lasix 80 mg IV once with reevaluation on 6/10.  - admit to stepdown, attending Dr. Andria Frames -  Lasix 80 mg IV x1 in ED and re-assess fluid status in the AM  - continuous cardiac monitoring - cardiology will see in the am, appreciate recommendations  Nausea and vomiting: Likely 2/2 to atrial fibrillation and cardioversion.  QTc has ranged from 510-580, so will use zofran judiciously.  Can change to phenergan if needed. - zofran 4 mg IV Q8H PRN  Hyponatremia, likely chronic: Patient presented with hyponatremia to 129.  She does not have AMS or any other symptoms of hyponatremia.  This could also be due to undertreated hypothyroidism.  She reports that she has had low sodium in the past but cannot remember the value.  Her  hyponatremia could be in the setting of hypervolemia, although she does not appear overtly volume overloaded on exam.  Will obtain repeat BMP and fluid restrict. - BMP @ 2300 - fluid restriction - monitor fluid status  Atrial fibrillation:  Has PAF and was in atrial fibrillation during the past week and on admission.  Has had a cardioversion and ablation in the past.  Takes warfarin 4 mg MTWF, 2 mg other days, INR therapeutic at 2.4.  Also takes metoprolol 100 mg daily for rate control.  She was successfully cardioverted in the ED.    - continuous cardiac monitoring - continue home warfarin regimen - cardiology consulted  - continue metoprolol 100 mg daily  HFrEF: EF 20-25% in June 2018.  Takes Lasix 40 mg BID at home.  Reports good compliance, has not recently missed any doses.  Endorses chronic nocturnal dyspnea.  Has had an ICD since 2005, with the last interrogation in March 2019.  BNP 846 on admission although no overt signs of fluid overload.  - strict I's and O's - fluid restricted diet - daily weights - consider repeat echo - continue metoprolol 100 mg daily - consider adding ACEI/ARB and spironolactone for chronic management  CKD Stage 4: Creatinine is 3.06 on admission, has ranged from 1-4 over the past few years according to chart review.  Unsure if diuresis will improve kidney function or worsen it since patient has elevated BNP but does not appear very fluid overloaded on exam. - daily BMP - reassess need for more lasix on 6/10  T2DM: At home on Lantus 24 units QHS, Humalog 6  units before dinner, and Tujeo.  Last A1c 10.8 in April 2018. - hold home medications - repeat A1c  - sSSI - CBGs TID AC  Acquired Hypothyroidism:  At home on Synthroid 50 mcg, although this was recently decreased from 75 mcg with subsequent weight gain and cold intolerance.  If her hypothyroidism is currently undertreated, it is possible that it contributed to her shortness of breath, hyponatremia, and  atrial fibrillation. - TSH -continue home synthroid   FEN/GI: renal/carb mod diet Prophylaxis: warfarin  Disposition: admit to stepdown  History of Present Illness:  Emily [name] is a 67 y.o. female presenting with weakness and shortness of breath, which have been going on for the past week.  She woke up on Friday, 5/31 and felt her heart racing and SOB.  She has an ICD in place, so she called the company to see if they noticed an irregular rhythm, since she was worried that she was in atrial fibrillation.  They noted that she was in atrial fibrillation at the time, and she was given an appointment for Wednesday, June 12.  On the morning of 6/9, her shortness of breath worsened so she asked her daughter to take her to the ED.  When she presented to the ED, she was also found to be in atrial fibrillation with a heart rate ranging from 40 to 120.  She was cardioverted successfully and felt better afterwards, although she had begun to feel some palpitations before speaking with [place].  She also endorsed an episode of vomiting before we spoke with her, and she vomited another time later in the evening.   Review Of Systems: Per HPI with the following additions:   Review of Systems  Constitutional: Negative for chills and fever.  HENT: Negative for congestion.   Respiratory: Negative for cough.   Cardiovascular: Negative for chest pain and palpitations.  Gastrointestinal: Positive for diarrhea and nausea. Negative for constipation.  Genitourinary: Negative for dysuria and urgency.  Skin: Negative for rash.  Neurological: Negative for dizziness.  Endo/Heme/Allergies: Does not bruise/bleed easily.   Patient Active Problem List   Diagnosis Date Noted  . Atrial fibrillation ([place]) 08/06/2017  . Acute on chronic systolic heart failure ([place]) 12/29/2016  . AICD (automatic cardioverter/defibrillator) present 12/29/2016  . Anemia 12/29/2016  . CKD (chronic kidney disease) stage 4, GFR 15-29 ml/min  ([facility]) 12/29/2016  . IDDM (insulin dependent diabetes mellitus) ([place]) 12/29/2016  . 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 Hypercholesteremia 12/29/2016  . Hypertension, well controlled 12/29/2016  . Hypothyroidism 12/29/2016  . Primary idiopathic dilated cardiomyopathy ([place]) 12/29/2016    Past Medical History: Past Medical History:  Diagnosis Date  . Atrial fibrillation ([place])   . DM (diabetes mellitus) ([place])   . HTN (hypertension)     Past Surgical History: Past Surgical History:  Procedure Laterality Date  . ABLATION    . BREAST BIOPSY    . CARDIAC DEFIBRILLATOR PLACEMENT  2005  . CATARACT EXTRACTION Bilateral   . CHOLECYSTECTOMY    . THYROIDECTOMY    . TOTAL VAGINAL HYSTERECTOMY      Social History: Social History   Tobacco Use  . Smoking status: Never Smoker  . Smokeless tobacco: Never Used  Substance Use Topics  . Alcohol use: No  . Drug use: No   Please also refer to relevant sections of EMR.  Family History: Family History  Problem Relation Age of Onset  . Hip fracture Mother   . COPD Father   .  CAD Sister   . Heart attack Sister     Allergies and Medications: Allergies  Allergen Reactions  . Amiodarone Rash  . Codeine Other (See Comments)    Euphoria, hallucinations  . Other Palpitations    Other reaction(s): Other (See Comments) PT STATES SHE GETS REALLY HOT , DIZZY HEADED, HAS PASSED OUT TWICE , AND ITCHING WITH IV DYE Tumeric  . Iodine Rash    topical   No current facility-administered medications on file prior to encounter.    Current Outpatient Medications on File Prior to Encounter  Medication Sig Dispense Refill  . acetaminophen (TYLENOL) 500 MG tablet Take 1,000 mg by mouth every 6 (six) hours as needed for mild pain.    . Biotin 1000 MCG tablet Take 1,000 mcg by mouth daily.     . Calcium Carb-Cholecalciferol (CALCIUM 600-D PO) Take 1 tablet by mouth daily.     . furosemide (LASIX) 40 MG tablet Take 1  tablet (40 mg total) by mouth 2 (two) times daily. 180 tablet 3  . Glucosamine-Chondroitin (OSTEO BI-FLEX REGULAR STRENGTH PO) Take 1 tablet by mouth daily.    . Insulin Glargine 300 UNIT/ML SOPN Inject 24 Units into the skin at bedtime.     . insulin lispro (HUMALOG) 100 UNIT/ML KiwkPen Inject 6 Units into the skin. Take before dinner    . levothyroxine (SYNTHROID, LEVOTHROID) 50 MCG tablet Take 50 mcg by mouth daily before breakfast.     . metoprolol succinate (TOPROL-XL) 100 MG 24 hr tablet TAKE 1 TABLET ONCE DAILY.    Marland Kitchen Omega-3 Fatty Acids (FISH OIL PO) Take 1,000 mg by mouth daily.     Marland Kitchen omeprazole (PRILOSEC) 40 MG capsule Take 40 mg by mouth daily.     . potassium chloride SA (K-DUR,KLOR-CON) 20 MEQ tablet Take 20 mEq by mouth every other day.     . simvastatin (ZOCOR) 20 MG tablet TAKE 1 TABLET BY MOUTH ONCE DAILY AT 6 PM. 28 tablet 11  . warfarin (COUMADIN) 2 MG tablet Take 1 tablet on Monday, Wednesday and Friday. Take 2 tablets(4 mg) on  all other days.  As directed by physician to maintain INR level. (Patient taking differently: Take 2-4 mg by mouth See admin instructions. 2mg  on Monday, Tuesday, Wednesday, Friday and 4 mg on Sunday, Thursday, Saturday.  As directed by physician to maintain INR level.) 135 tablet 0    Objective: BP 113/64   Pulse (!) 59   Temp 97.6 F (36.4 C) (Oral)   Resp 20   Ht 5\' 3"  (1.6 m)   Wt 135 lb (61.2 kg)   SpO2 97%   BMI 23.91 kg/m    Physical Exam  Constitutional: She is oriented to person, place, and time. She appears well-developed and well-nourished. No distress.  HENT:  Head: Normocephalic and atraumatic.  Mouth/Throat: Oropharynx is clear and moist. No oropharyngeal exudate.  Eyes: Pupils are equal, round, and reactive to light. Conjunctivae and EOM are normal.  Neck: Normal range of motion. Neck supple.  Thyroidectomy scar  Cardiovascular: Normal rate, regular rhythm, normal heart sounds and intact distal pulses.  Pulmonary/Chest:  Effort normal and breath sounds normal. No respiratory distress.  Abdominal: Soft. Bowel sounds are normal. She exhibits no distension. There is tenderness.  Mild tenderness in LLQ; hepatomegaly palpated on exam  Musculoskeletal: Normal range of motion. She exhibits no deformity.  Trace pitting pedal edema bilaterally  Neurological: She is alert and oriented to person, place, and time.  Skin: Skin is  warm and dry. She is not diaphoretic.  Psychiatric: She has a normal mood and affect. Her behavior is normal.    Labs and Imaging: CBC BMET  Recent Labs  Lab 08/06/17 1004  WBC 12.6*  HGB 12.4  HCT 37.7  PLT 250   Recent Labs  Lab 08/06/17 1004  NA 129*  K 4.5  CL 99*  CO2 18*  BUN 46*  CREATININE 3.06*  GLUCOSE 220*  CALCIUM 8.2*     Dg Chest 2 View  Result Date: 08/06/2017 CLINICAL DATA:  Shortness of breath with cardiac arrhythmia EXAM: CHEST - 2 VIEW COMPARISON:  April 24, 2003 FINDINGS: There is a small left pleural effusion. There is no edema or consolidation. There is apparent nipple shadow on the right. Heart is mildly enlarged with pulmonary vascularity normal. Pacemaker leads are attached the right atrium, right ventricle, coronary sinus. There is aortic atherosclerosis. No adenopathy. No evident bone lesions. IMPRESSION: Small left pleural effusion.  No edema or consolidation. Cardiac prominence, stable. Pacemaker leads as described. There is aortic atherosclerosis. Aortic Atherosclerosis (ICD10-I70.0). Electronically Signed   By: Lowella Grip III M.D.   On: 08/06/2017 10:35    [place] PGY-1 [place] Intern pager: [phone]   I have seen and evaluated the above patient with Dr. Shan Levans and agree with her documentation.  I have included my edits in blue.   Lovenia Kim, MD PGY-2, [facility]

## 2017-08-06 NOTE — ED Notes (Signed)
Pt is vomiting- order for zofran from Dr. Vanita Panda

## 2017-08-07 ENCOUNTER — Other Ambulatory Visit (HOSPITAL_COMMUNITY): Payer: PPO

## 2017-08-07 ENCOUNTER — Encounter (HOSPITAL_COMMUNITY): Payer: Self-pay

## 2017-08-07 ENCOUNTER — Other Ambulatory Visit: Payer: Self-pay

## 2017-08-07 DIAGNOSIS — E039 Hypothyroidism, unspecified: Secondary | ICD-10-CM

## 2017-08-07 DIAGNOSIS — I4891 Unspecified atrial fibrillation: Secondary | ICD-10-CM | POA: Insufficient documentation

## 2017-08-07 LAB — OSMOLALITY: Osmolality: 299 mOsm/kg — ABNORMAL HIGH (ref 275–295)

## 2017-08-07 LAB — BASIC METABOLIC PANEL
Anion gap: 19 — ABNORMAL HIGH (ref 5–15)
Anion gap: 14 (ref 5–15)
BUN: 47 mg/dL — AB (ref 6–20)
BUN: 51 mg/dL — AB (ref 6–20)
CALCIUM: 8.2 mg/dL — AB (ref 8.9–10.3)
CHLORIDE: 99 mmol/L — AB (ref 101–111)
CO2: 12 mmol/L — ABNORMAL LOW (ref 22–32)
CO2: 17 mmol/L — AB (ref 22–32)
CREATININE: 3.29 mg/dL — AB (ref 0.44–1.00)
CREATININE: 3.45 mg/dL — AB (ref 0.44–1.00)
Calcium: 8 mg/dL — ABNORMAL LOW (ref 8.9–10.3)
Chloride: 96 mmol/L — ABNORMAL LOW (ref 101–111)
GFR calc Af Amer: 16 mL/min — ABNORMAL LOW (ref >60)
GFR calc Af Amer: 15 mL/min — ABNORMAL LOW (ref >60)
GFR calc non Af Amer: 13 mL/min — ABNORMAL LOW (ref >60)
GFR, EST NON AFRICAN AMERICAN: 13 mL/min — AB (ref >60)
GLUCOSE: 221 mg/dL — AB (ref 65–99)
Glucose, Bld: 202 mg/dL — ABNORMAL HIGH (ref 65–99)
POTASSIUM: 4.4 mmol/L (ref 3.5–5.1)
Potassium: 4.3 mmol/L (ref 3.5–5.1)
Sodium: 127 mmol/L — ABNORMAL LOW (ref 135–145)
Sodium: 130 mmol/L — ABNORMAL LOW (ref 135–145)

## 2017-08-07 LAB — CBG MONITORING, ED
GLUCOSE-CAPILLARY: 254 mg/dL — AB (ref 65–99)
Glucose-Capillary: 148 mg/dL — ABNORMAL HIGH (ref 65–99)

## 2017-08-07 LAB — CBC
HCT: 32 % — ABNORMAL LOW (ref 36.0–46.0)
Hemoglobin: 10.7 g/dL — ABNORMAL LOW (ref 12.0–15.0)
MCH: 30.5 pg (ref 26.0–34.0)
MCHC: 33.4 g/dL (ref 30.0–36.0)
MCV: 91.2 fL (ref 78.0–100.0)
PLATELETS: 248 10*3/uL (ref 150–400)
RBC: 3.51 MIL/uL — ABNORMAL LOW (ref 3.87–5.11)
RDW: 13.3 % (ref 11.5–15.5)
WBC: 11.4 10*3/uL — ABNORMAL HIGH (ref 4.0–10.5)

## 2017-08-07 LAB — PROTIME-INR
INR: 2.54
Prothrombin Time: 27.1 seconds — ABNORMAL HIGH (ref 11.4–15.2)

## 2017-08-07 LAB — HIV ANTIBODY (ROUTINE TESTING W REFLEX): HIV Screen 4th Generation wRfx: NONREACTIVE

## 2017-08-07 LAB — GLUCOSE, CAPILLARY
GLUCOSE-CAPILLARY: 232 mg/dL — AB (ref 65–99)
Glucose-Capillary: 274 mg/dL — ABNORMAL HIGH (ref 65–99)

## 2017-08-07 MED ORDER — METOPROLOL TARTRATE 5 MG/5ML IV SOLN
2.5000 mg | Freq: Once | INTRAVENOUS | Status: AC
  Administered 2017-08-07: 2.5 mg via INTRAVENOUS
  Filled 2017-08-07: qty 5

## 2017-08-07 MED ORDER — DILTIAZEM HCL 100 MG IV SOLR
5.0000 mg/h | INTRAVENOUS | Status: DC
  Administered 2017-08-07: 5 mg/h via INTRAVENOUS
  Administered 2017-08-08: 7.5 mg/h via INTRAVENOUS
  Filled 2017-08-07 (×3): qty 100

## 2017-08-07 MED ORDER — OFF THE BEAT BOOK
Freq: Once | Status: AC
  Administered 2017-08-07: 18:00:00
  Filled 2017-08-07: qty 1

## 2017-08-07 MED ORDER — PANTOPRAZOLE SODIUM 40 MG PO TBEC
40.0000 mg | DELAYED_RELEASE_TABLET | Freq: Every day | ORAL | Status: DC
  Administered 2017-08-07 – 2017-08-08 (×2): 40 mg via ORAL
  Filled 2017-08-07 (×2): qty 1

## 2017-08-07 MED ORDER — SODIUM CHLORIDE 0.9 % IV BOLUS
500.0000 mL | Freq: Once | INTRAVENOUS | Status: AC
  Administered 2017-08-07: 500 mL via INTRAVENOUS

## 2017-08-07 MED ORDER — LEVOTHYROXINE SODIUM 88 MCG PO TABS
88.0000 ug | ORAL_TABLET | Freq: Every day | ORAL | Status: DC
  Administered 2017-08-07 – 2017-08-08 (×2): 88 ug via ORAL
  Filled 2017-08-07 (×2): qty 1

## 2017-08-07 MED ORDER — LEVOTHYROXINE SODIUM 88 MCG PO TABS
88.0000 ug | ORAL_TABLET | Freq: Every day | ORAL | Status: DC
  Filled 2017-08-07: qty 1

## 2017-08-07 MED ORDER — DILTIAZEM HCL 100 MG IV SOLR: 5.0000 mg/h | INTRAVENOUS | Status: DC

## 2017-08-07 NOTE — ED Notes (Signed)
Attempted report 

## 2017-08-07 NOTE — ED Notes (Signed)
[name] lunch tray arrived @ 1122.

## 2017-08-07 NOTE — ED Notes (Signed)
[employer] paged to [employer] @ [phone].

## 2017-08-07 NOTE — Progress Notes (Signed)
Inpatient Diabetes Program Recommendations  AACE/ADA: New Consensus Statement on Inpatient Glycemic Control (2015)  Target Ranges:  Prepandial:   less than 140 mg/dL      Peak postprandial:   less than 180 mg/dL (1-2 hours)      Critically ill patients:  140 - 180 mg/dL  Results for AZARIYA, FREEMAN (MRN [id number]) as of 08/07/2017 12:36  Ref. Range 08/07/2017 08:35 08/07/2017 12:27  Glucose-Capillary Latest Ref Range: 65 - 99 mg/dL 148 (H) 254 (H)  Results for DEASHIA, SOULE (MRN [id number]) as of 08/07/2017 12:36  Ref. Range 08/06/2017 20:21 08/07/2017 04:17  Glucose Latest Ref Range: 65 - 99 mg/dL 221 (H) 202 (H)   Results for ANITA, LAGUNA (MRN [id number]) as of 08/07/2017 12:36  Ref. Range 08/06/2017 20:21  Hemoglobin A1C Latest Ref Range: 4.8 - 5.6 % 8.3 (H)   Review of Glycemic Control  Diabetes history: DM Outpatient Diabetes medications: Glargine 24 units QHS, Humalog 6 units with dinner Current orders for Inpatient glycemic control: Novolog 0-9 units TID with meals  Inpatient Diabetes Program Recommendations: Insulin - Basal: If glucose is consistently over 180 mg/dl with Novolog correction, may want to consider ordering low dose basal insulin. Correction (SSI): Please consider ordering Novolog 0-5 units QHS for bedtime correction scale. Insulin - Meal Coverage: Please consider ordering Novolog 3 units TID with meals for meal coverage if patient eats at least 50% of meals. HgbA1C: A1C 8.3% on 08/06/17 indicating an average glucose of 192 mg/dl over the past 2-3 months.  Thanks, Barnie Alderman, RN, MSN, CDE Diabetes Coordinator Inpatient Diabetes Program [phone] (Team Pager from 8am to 5pm)

## 2017-08-07 NOTE — ED Notes (Signed)
Attending paged re: BP and Metoprolol XL tab, HR remains elevated

## 2017-08-07 NOTE — Progress Notes (Addendum)
Progress Note  Patient Name: Emily [name] Date of Encounter: 08/07/2017  Primary Cardiologist: Dr. Bronson Ing Electrophysiologist: Dr. Lovena Le  Subjective   Aware of heart racing, no CP, gets winded easily    Inpatient Medications    Scheduled Meds: . insulin aspart  0-9 Units Subcutaneous TID WC  . levothyroxine  88 mcg Oral QAC breakfast  . metoprolol succinate  100 mg Oral Daily  . pantoprazole  40 mg Oral Daily  . propofol  0.5 mg/kg Intravenous Once  . simvastatin  20 mg Oral q1800  . warfarin  4 mg Oral Once per day on Sun Thu Sat   And  . warfarin  2 mg Oral Once per day on Mon Tue Wed Fri  . Warfarin - Physician Dosing Inpatient   Does not apply q1800   Continuous Infusions: . diltiazem (CARDIZEM) infusion     PRN Meds: acetaminophen, ondansetron (ZOFRAN) IV   Vital Signs    Vitals:   08/07/17 1200 08/07/17 1230 08/07/17 1300 08/07/17 1330  BP: 103/70 98/73 104/80 106/69  Pulse: (!) 110 (!) 117 (!) 135   Resp: 20 (!) 22 (!) 24 17  Temp:      TempSrc:      SpO2: 97% 99% 96%   Weight:      Height:        Intake/Output Summary (Last 24 hours) at 08/07/2017 1415 Last data filed at 08/06/2017 1604 Gross per 24 hour  Intake 10 ml  Output -  Net 10 ml   Filed Weights   08/06/17 1007  Weight: 135 lb (61.2 kg)    Telemetry    AFib 110's-120's - Personally Reviewed  ECG    Post DCCV SR 60bpm, PR 127ms - Personally Reviewed  Physical Exam   GEN: No acute distress.   Neck: No JVD Cardiac: iRRR, tachycardic, 1+ SM, rubs, or gallops.  Respiratory: CTA b/l. GI: Soft, nontender, non-distended  MS: No edema; No deformity. Neuro:  Nonfocal  Psych: Normal affect   Labs    Chemistry Recent Labs  Lab 08/06/17 1004 08/06/17 1254 08/06/17 2021 08/07/17 0417  NA 129*  --  127* 130*  K 4.5  --  4.4 4.3  CL 99*  --  96* 99*  CO2 18*  --  12* 17*  GLUCOSE 220*  --  221* 202*  BUN 46*  --  47* 51*  CREATININE 3.06*  --  3.29* 3.45*  CALCIUM  8.2*  --  8.2* 8.0*  PROT  --  7.4  --   --   ALBUMIN  --  3.6  --   --   AST  --  19  --   --   ALT  --  17  --   --   ALKPHOS  --  77  --   --   BILITOT  --  1.9*  --   --   GFRNONAA 15*  --  13* 13*  GFRAA 17*  --  16* 15*  ANIONGAP 12  --  19* 14     Hematology Recent Labs  Lab 08/06/17 1004 08/07/17 0417  WBC 12.6* 11.4*  RBC 3.93 3.51*  HGB 12.4 10.7*  HCT 37.7 32.0*  MCV 95.9 91.2  MCH 31.6 30.5  MCHC 32.9 33.4  RDW 13.6 13.3  PLT 250 248    Cardiac EnzymesNo results for input(s): TROPONINI in the last 168 hours.  Recent Labs  Lab 08/06/17 1300  TROPIPOC 0.03  BNP Recent Labs  Lab 08/06/17 1150  BNP 846.2*     DDimer No results for input(s): DDIMER in the last 168 hours.   Radiology    Dg Chest 2 View Result Date: 08/06/2017 CLINICAL DATA:  Shortness of breath with cardiac arrhythmia EXAM: CHEST - 2 VIEW COMPARISON:  April 24, 2003 FINDINGS: There is a small left pleural effusion. There is no edema or consolidation. There is apparent nipple shadow on the right. Heart is mildly enlarged with pulmonary vascularity normal. Pacemaker leads are attached the right atrium, right ventricle, coronary sinus. There is aortic atherosclerosis. No adenopathy. No evident bone lesions. IMPRESSION: Small left pleural effusion.  No edema or consolidation. Cardiac prominence, stable. Pacemaker leads as described. There is aortic atherosclerosis. Aortic Atherosclerosis (ICD10-I70.0). Electronically Signed   By: Lowella Grip III M.D.   On: 08/06/2017 10:35    Cardiac Studies   08/11/16 TTE (Done at [facility]) LVEF 20-25% Eccentric LVEF WMA septal motion c/w pacing, and akinesis of AW, svere hypokinesis remaining segments RLV diastolic filling pattern is restrictive Evidence of residual ASD by color flow (L>R) Small mobile density attached to pacer leas in RA: DDX includes fibrous adhesions, vegetation Mod MR (LA 4cm, volume 51.36ml) Mod-severe TR PA  73mmHg  Patient Profile     67 y.o. female with PMHx of HTN, HLDm IDDM, NICM, hx of VT and AFib w/CRT-D, DRI (IV), hypothyroidism, hx of pericardial window, admitted with AFib RVR.  Device information:   Assessment & Plan    1. Paroxysmal AFib w/RVR     DCCV yesterday with ERAF     CHA2DS2Vasc is 5, on warfarin     INR 2.54 today  DCCV yesterday to SR though had ERAF  Dr. Caryl Comes has seen and examined the patient No good AAD options given CKD, CM and true amiodarone allergy (rash/skin changes) Resume dilt gtt for rate control, to keep SBP >= 20mmHg  AVNode ablation, vs AFib ablation.  Dr. Caryl Comes will discuss case with Dr. Rayann Heman, update echo here ? RA lead echodensity last year, no fever, WBC 12.6  >> 11.4 today  2. NICM     Does not appear overtly overloaded by exam today     BNP 846, given renal disease, hard to weigh this result     S/p lasix 80mg  IV once  3. CKD (IV)     Up from baseline, 2.3-3 is where she has been in the last 2-3 months  4. Hypothyroidism     TSH is high at 18.926     Medicine is addressing  5. Hyponatremia     Up to 130 today (127)     Medicine is addressing     For questions or updates, please contact [place] Please consult [email or web address] for contact info under Cardiology/STEMI.      Signed, Baldwin [place], PA-C  08/07/2017, 2:15 PM    Pt is seen and examined Recurrent atrial fibrillation  As outlined above no good drug options with depressed LV function, gd 4 renal insufficiency and amio intolerance  LA size was > 50 mm3, so would favor AV ablation  Her thyroid status is not helpful in explaining the recent flurry of VT as she is significantly hypothyroid and as she become euthyroid more likely rather than less to have more problems with afib

## 2017-08-07 NOTE — ED Notes (Signed)
Admitting personnel at bedside

## 2017-08-07 NOTE — ED Notes (Signed)
Hinton [name] with cardiology returned call and aware of pts HR and request for update on plan of care

## 2017-08-07 NOTE — Progress Notes (Addendum)
Family Medicine Teaching Service Daily Progress Note Intern Pager: [phone]  Patient name: Emily [name] Medical record number: [id number] Date of birth: December 06, 1950 Age: 67 y.o. Gender: female  Primary Care Provider: Celene Squibb, MD Consultants: cardiology Code Status: partial (DNI)  Pt Overview and Major Events to Date:  Admitted on 6/9 after symptomatic atrial fibrillation, was cardioverted 6/10 back in atrial fibrillation  Assessment and Plan:  Emily [name] is a 67 y.o. female presenting with shortness of breath and hyponatremia. PMH is significant for HFrEF s/p ICD, atrial fibrillation, CKD, T2DM, HLD, and hypothyroidism.    Atrial fibrillation, uncontrolled: Thought to be due to current hypothyroidism.  Underwent cardioversion on 6/9 with return to NSR, but was back in atrial fibrillation with RVR overnight.  Cardiology was consulted, who recommended that she receive Lopressor IV 2.5 mg as needed for rate control if her pressures could tolerate it.  They plan to see her on 6/10 am. - Lopressor 2.5 mg IV PRN for HR>100 - continue home Toprol XL 100 mg daily - cardiology consult, appreciate recommendations - continue home Warfarin dose, 4 mg MTWF, 2 mg other days of the week - tachycardia may be exacerbated by dehydration; may give 500 cc bolus if HR > 120  Nausea and vomiting, improved: Likely 2/2 to cardioversion.  QTc has ranged from 510-580, so will use zofran judiciously.  - will d/c zofran for now since patient's last dose was at 2000 on 6/9  Hyponatremia, likely chronic: Patient presented with hyponatremia to 129, is 130 on 6/10.  No AMS or other symptoms of hyponatremia.  Likely due to under treated hypothyroidism. - consider NS infusion - monitor fluid status - obtain urine osmolality, urine sodium, serum osmolality  HFrEF: EF 20-25% in June 2018.  BNP 846 on admission although no overt signs of fluid overload, and Stage 4 CKD could contribute to poor clearance  of BNP. - strict I's and O's - fluid restricted diet - daily weights - consider repeat echo - continue metoprolol 100 mg daily - consider adding ACEI/ARB and spironolactone for chronic management  CKD Stage 4: Creatinine is 3.06>3.45 on 6/10.  Around baseline.  Likely due to diuresis overnight.   - daily BMP  T2DM: At home on Lantus 24 units QHS, Humalog 6 units before dinner, and Tujeo.  A1c of 8.3 on 6/9.  CBGs ranged from 221-148 since admission. - hold home medications - sSSI - CBGs TID AC  Acquired Hypothyroidism, worsening:  TSH 19 on 6/9, patient reporting recent change from 88 mcg synthroid to 50 with subsequent weight gain and cold intolerance.  Could be source of atrial fibrillation. - synthroid 88 mcg - call PCP for last 3 TSH readings  FEN/GI: renal/carb mod diet Prophylaxis: warfarin  Disposition: continue in stepdown  Subjective:  Patient says that she is less nauseated today, but she continues to feel palpitations.  She has not gotten any food overnight and would really like to eat.  She otherwise feels okay.  Objective: Temp:  [87.8 F (31 C)-97.6 F (36.4 C)] 97.6 F (36.4 C) (06/09 1042) Pulse Rate:  [40-133] 127 (06/10 0600) Resp:  [0-31] 25 (06/09 2330) BP: (91-118)/(50-86) 110/73 (06/10 0600) SpO2:  [94 %-100 %] 96 % (06/10 0600) Weight:  [135 lb (61.2 kg)] 135 lb (61.2 kg) (06/09 1007) Physical Exam: General: NAD, lying in bed, somewhat uncomfortable Cardiovascular: tachycardic, irregular rhythm, no MRG Respiratory: CTAB, no wheezing, rales, or rhonchi Abdomen: soft, nontender to palpation Extremities: warm and  well perfused, normal ROM  Laboratory: Recent Labs  Lab 08/06/17 1004 08/07/17 0417  WBC 12.6* 11.4*  HGB 12.4 10.7*  HCT 37.7 32.0*  PLT 250 248   Recent Labs  Lab 08/06/17 1004 08/06/17 1254 08/06/17 2021 08/07/17 0417  NA 129*  --  127* 130*  K 4.5  --  4.4 4.3  CL 99*  --  96* 99*  CO2 18*  --  12* 17*  BUN 46*  --   47* 51*  CREATININE 3.06*  --  3.29* 3.45*  CALCIUM 8.2*  --  8.2* 8.0*  PROT  --  7.4  --   --   BILITOT  --  1.9*  --   --   ALKPHOS  --  77  --   --   ALT  --  17  --   --   AST  --  19  --   --   GLUCOSE 220*  --  221* 202*    Imaging/Diagnostic Tests: Dg Chest 2 View  Result Date: 08/06/2017 CLINICAL DATA:  Shortness of breath with cardiac arrhythmia EXAM: CHEST - 2 VIEW COMPARISON:  April 24, 2003 FINDINGS: There is a small left pleural effusion. There is no edema or consolidation. There is apparent nipple shadow on the right. Heart is mildly enlarged with pulmonary vascularity normal. Pacemaker leads are attached the right atrium, right ventricle, coronary sinus. There is aortic atherosclerosis. No adenopathy. No evident bone lesions. IMPRESSION: Small left pleural effusion.  No edema or consolidation. Cardiac prominence, stable. Pacemaker leads as described. There is aortic atherosclerosis. Aortic Atherosclerosis (ICD10-I70.0). Electronically Signed   By: Lowella Grip III M.D.   On: 08/06/2017 10:35     Kathrene Alu, MD 08/07/2017, 7:15 AM PGY-1, [place] Intern pager: [phone], text pages welcome

## 2017-08-07 NOTE — ED Notes (Signed)
Spoke to [facility] medicine about patients heart rate. No orders received

## 2017-08-07 NOTE — ED Notes (Signed)
MD paged, made aware of pts HR and rhythm (A-fib 122)

## 2017-08-07 NOTE — ED Notes (Signed)
Alert co-operative  C/o the monitor making too much noise

## 2017-08-07 NOTE — ED Notes (Signed)
Kris Mouton, MD returned call and plan is to hold Metoprolol XL for now and to administer 500 mL bolus, paging cardiology

## 2017-08-07 NOTE — ED Notes (Signed)
Admitting MD w/ Family Medicine paged to [facility] @ [phone].

## 2017-08-07 NOTE — ED Notes (Signed)
Cardiology at bedside.

## 2017-08-08 ENCOUNTER — Encounter (HOSPITAL_COMMUNITY)
Admission: EM | Disposition: A | Discharge status: Home / Self Care | Payer: Self-pay | Source: Home / Self Care | Attending: Family Medicine

## 2017-08-08 ENCOUNTER — Inpatient Hospital Stay (HOSPITAL_COMMUNITY): Payer: PPO

## 2017-08-08 DIAGNOSIS — I361 Nonrheumatic tricuspid (valve) insufficiency: Secondary | ICD-10-CM

## 2017-08-08 DIAGNOSIS — I480 Paroxysmal atrial fibrillation: Secondary | ICD-10-CM

## 2017-08-08 DIAGNOSIS — I340 Nonrheumatic mitral (valve) insufficiency: Secondary | ICD-10-CM

## 2017-08-08 HISTORY — PX: AV NODE ABLATION: EP1193

## 2017-08-08 LAB — BASIC METABOLIC PANEL
ANION GAP: 12 (ref 5–15)
BUN: 54 mg/dL — AB (ref 6–20)
CALCIUM: 8 mg/dL — AB (ref 8.9–10.3)
CO2: 20 mmol/L — ABNORMAL LOW (ref 22–32)
Chloride: 99 mmol/L — ABNORMAL LOW (ref 101–111)
Creatinine, Ser: 3.36 mg/dL — ABNORMAL HIGH (ref 0.44–1.00)
GFR calc Af Amer: 15 mL/min — ABNORMAL LOW (ref >60)
GFR, EST NON AFRICAN AMERICAN: 13 mL/min — AB (ref >60)
GLUCOSE: 175 mg/dL — AB (ref 65–99)
POTASSIUM: 4.2 mmol/L (ref 3.5–5.1)
SODIUM: 131 mmol/L — AB (ref 135–145)

## 2017-08-08 LAB — PROTIME-INR
INR: 2.63
PROTHROMBIN TIME: 27.9 s — AB (ref 11.4–15.2)

## 2017-08-08 LAB — ECHOCARDIOGRAM COMPLETE
Height: 63 in
Weight: 2230.4 oz

## 2017-08-08 LAB — GLUCOSE, CAPILLARY
GLUCOSE-CAPILLARY: 164 mg/dL — AB (ref 65–99)
GLUCOSE-CAPILLARY: 288 mg/dL — AB (ref 65–99)
Glucose-Capillary: 152 mg/dL — ABNORMAL HIGH (ref 65–99)
Glucose-Capillary: 162 mg/dL — ABNORMAL HIGH (ref 65–99)

## 2017-08-08 LAB — POCT ACTIVATED CLOTTING TIME: Activated Clotting Time: 191 s

## 2017-08-08 LAB — OSMOLALITY, URINE: OSMOLALITY UR: 338 mosm/kg (ref 300–900)

## 2017-08-08 LAB — SODIUM, URINE, RANDOM

## 2017-08-08 SURGERY — AV NODE ABLATION

## 2017-08-08 MED ORDER — LEVOTHYROXINE SODIUM 88 MCG PO TABS
88.0000 ug | ORAL_TABLET | Freq: Every day | ORAL | Status: DC
  Administered 2017-08-09: 88 ug via ORAL
  Filled 2017-08-08: qty 1

## 2017-08-08 MED ORDER — MIDAZOLAM HCL 5 MG/5ML IJ SOLN
INTRAMUSCULAR | Status: DC | PRN
  Administered 2017-08-08 (×2): 1 mg via INTRAVENOUS

## 2017-08-08 MED ORDER — HEPARIN (PORCINE) IN NACL 2-0.9 UNITS/ML
INTRAMUSCULAR | Status: AC | PRN
  Administered 2017-08-08: 500 mL

## 2017-08-08 MED ORDER — BUPIVACAINE HCL (PF) 0.25 % IJ SOLN
INTRAMUSCULAR | Status: DC | PRN
  Administered 2017-08-08: 50 mL

## 2017-08-08 MED ORDER — FENTANYL CITRATE (PF) 100 MCG/2ML IJ SOLN
INTRAMUSCULAR | Status: DC | PRN
  Administered 2017-08-08 (×2): 12.5 ug via INTRAVENOUS

## 2017-08-08 MED ORDER — SODIUM CHLORIDE 0.9 % IV SOLN: 250.0000 mL | INTRAVENOUS | Status: DC | PRN

## 2017-08-08 MED ORDER — HEPARIN SODIUM (PORCINE) 1000 UNIT/ML IJ SOLN
INTRAMUSCULAR | Status: DC | PRN
  Administered 2017-08-08: 5000 [IU] via INTRAVENOUS

## 2017-08-08 MED ORDER — ACETAMINOPHEN 325 MG PO TABS: 650.0000 mg | ORAL_TABLET | ORAL | Status: DC | PRN

## 2017-08-08 MED ORDER — SODIUM CHLORIDE 0.9 % IV SOLN
INTRAVENOUS | Status: DC
  Administered 2017-08-08: 07:00:00 via INTRAVENOUS

## 2017-08-08 MED ORDER — HEPARIN SODIUM (PORCINE) 1000 UNIT/ML IJ SOLN
INTRAMUSCULAR | Status: AC
  Filled 2017-08-08: qty 1

## 2017-08-08 MED ORDER — MIDAZOLAM HCL 5 MG/5ML IJ SOLN
INTRAMUSCULAR | Status: AC
  Filled 2017-08-08: qty 5

## 2017-08-08 MED ORDER — SODIUM CHLORIDE 0.9% FLUSH: 3.0000 mL | INTRAVENOUS | Status: DC | PRN

## 2017-08-08 MED ORDER — FENTANYL CITRATE (PF) 100 MCG/2ML IJ SOLN
INTRAMUSCULAR | Status: AC
  Filled 2017-08-08: qty 2

## 2017-08-08 MED ORDER — BUPIVACAINE HCL (PF) 0.25 % IJ SOLN
INTRAMUSCULAR | Status: AC
  Filled 2017-08-08: qty 60

## 2017-08-08 MED ORDER — SODIUM CHLORIDE 0.9% FLUSH
3.0000 mL | Freq: Two times a day (BID) | INTRAVENOUS | Status: DC
  Administered 2017-08-08: 3 mL via INTRAVENOUS

## 2017-08-08 MED ORDER — ONDANSETRON HCL 4 MG/2ML IJ SOLN: 4.0000 mg | Freq: Four times a day (QID) | INTRAMUSCULAR | Status: DC | PRN

## 2017-08-08 SURGICAL SUPPLY — 7 items
BAG SNAP BAND KOVER 36X36 (MISCELLANEOUS) ×2 IMPLANT
CATH CELSIUS THERM D CV 7F (ABLATOR) ×1 IMPLANT
PACK EP LATEX FREE (CUSTOM PROCEDURE TRAY) ×2
PACK EP LF (CUSTOM PROCEDURE TRAY) ×1 IMPLANT
PAD DEFIB LIFELINK (PAD) ×2 IMPLANT
SHEATH AVANTI 11CM 8FR (SHEATH) ×2 IMPLANT
SHIELD RADPAD SCOOP 12X17 (MISCELLANEOUS) ×2 IMPLANT

## 2017-08-08 NOTE — Progress Notes (Signed)
  Echocardiogram 2D Echocardiogram has been performed.  Bobak Oguinn G Chastin Riesgo 08/08/2017, 10:11 AM

## 2017-08-08 NOTE — Progress Notes (Signed)
26F sheaths removed by Burlene Arnt RTR.  Sheath removed from right femoral artery and vein intact.  30 min manual pressure applied for arterial and 5 min manuel pressure applied for venous. Site level 0 x 2.Sol Blazing with 4x4 and Tegaderm dressing applied.  DP +2.  Post instructions given.  Bed rest begins at 16:45 hrs.

## 2017-08-08 NOTE — H&P (View-Only) (Signed)
Progress Note  Patient Name: Emily [name] Date of Encounter: 08/08/2017  Primary Cardiologist: Dr. Bronson Ing Electrophysiologist: Dr. Lovena Le  Subjective   Tired, "just can't sleep well in AFib", aware of fast beating, no CP, no SOB  Inpatient Medications    Scheduled Meds: . insulin aspart  0-9 Units Subcutaneous TID WC  . levothyroxine  88 mcg Oral QAC breakfast  . metoprolol succinate  100 mg Oral Daily  . pantoprazole  40 mg Oral Daily  . propofol  0.5 mg/kg Intravenous Once  . simvastatin  20 mg Oral q1800  . warfarin  4 mg Oral Once per day on Sun Thu Sat   And  . warfarin  2 mg Oral Once per day on Mon Tue Wed Fri  . Warfarin - Physician Dosing Inpatient   Does not apply q1800   Continuous Infusions: . sodium chloride 50 mL/hr at 08/08/17 0651  . diltiazem (CARDIZEM) infusion 7.5 mg/hr (08/08/17 0700)   PRN Meds: acetaminophen   Vital Signs    Vitals:   08/07/17 2120 08/07/17 2344 08/08/17 0442 08/08/17 0445  BP: (!) 100/50 100/77  110/90  Pulse:  96    Resp:  19    Temp:  98.3 F (36.8 C)  98.4 F (36.9 C)  TempSrc:  Oral  Oral  SpO2:  97%    Weight:   139 lb 6.4 oz (63.2 kg)   Height:        Intake/Output Summary (Last 24 hours) at 08/08/2017 0805 Last data filed at 08/08/2017 0700 Gross per 24 hour  Intake 1232.82 ml  Output 200 ml  Net 1032.82 ml   Filed Weights   08/06/17 1007 08/07/17 1424 08/08/17 0442  Weight: 135 lb (61.2 kg) 138 lb 6.4 oz (62.8 kg) 139 lb 6.4 oz (63.2 kg)    Telemetry    AFib 100-110's - Personally Reviewed  ECG    Post DCCV SR 60bpm, PR 198ms - Personally Reviewed  Physical Exam   GEN: No acute distress, talking on phone comfortably as I enter.   Neck: No JVD Cardiac: iRRR, tachycardic, 1+ SM, rubs, or gallops.  Respiratory: CTA b/l. GI: Soft, nontender, non-distended  MS: No edema; No deformity. Neuro:  Nonfocal  Psych: Normal affect   Labs    Chemistry Recent Labs  Lab 08/06/17 1254  08/06/17 2021 08/07/17 0417 08/08/17 0605  NA  --  127* 130* 131*  K  --  4.4 4.3 4.2  CL  --  96* 99* 99*  CO2  --  12* 17* 20*  GLUCOSE  --  221* 202* 175*  BUN  --  47* 51* 54*  CREATININE  --  3.29* 3.45* 3.36*  CALCIUM  --  8.2* 8.0* 8.0*  PROT 7.4  --   --   --   ALBUMIN 3.6  --   --   --   AST 19  --   --   --   ALT 17  --   --   --   ALKPHOS 77  --   --   --   BILITOT 1.9*  --   --   --   GFRNONAA  --  13* 13* 13*  GFRAA  --  16* 15* 15*  ANIONGAP  --  19* 14 12     Hematology Recent Labs  Lab 08/06/17 1004 08/07/17 0417  WBC 12.6* 11.4*  RBC 3.93 3.51*  HGB 12.4 10.7*  HCT 37.7 32.0*  MCV 95.9 91.2  MCH 31.6 30.5  MCHC 32.9 33.4  RDW 13.6 13.3  PLT 250 248    Cardiac EnzymesNo results for input(s): TROPONINI in the last 168 hours.  Recent Labs  Lab 08/06/17 1300  TROPIPOC 0.03     BNP Recent Labs  Lab 08/06/17 1150  BNP 846.2*     DDimer No results for input(s): DDIMER in the last 168 hours.   Radiology    Dg Chest 2 View Result Date: 08/06/2017 CLINICAL DATA:  Shortness of breath with cardiac arrhythmia EXAM: CHEST - 2 VIEW COMPARISON:  April 24, 2003 FINDINGS: There is a small left pleural effusion. There is no edema or consolidation. There is apparent nipple shadow on the right. Heart is mildly enlarged with pulmonary vascularity normal. Pacemaker leads are attached the right atrium, right ventricle, coronary sinus. There is aortic atherosclerosis. No adenopathy. No evident bone lesions. IMPRESSION: Small left pleural effusion.  No edema or consolidation. Cardiac prominence, stable. Pacemaker leads as described. There is aortic atherosclerosis. Aortic Atherosclerosis (ICD10-I70.0). Electronically Signed   By: Lowella Grip III M.D.   On: 08/06/2017 10:35    Cardiac Studies   08/11/16 TTE (Done at [facility]) LVEF 20-25% Eccentric LVEF WMA septal motion c/w pacing, and akinesis of AW, svere hypokinesis remaining segments RLV  diastolic filling pattern is restrictive Evidence of residual ASD by color flow (L>R) Small mobile density attached to pacer leas in RA: DDX includes fibrous adhesions, vegetation Mod MR (LA 4cm, volume 51.17ml) Mod-severe TR PA 75mmHg  Patient Profile     67 y.o. female with PMHx of HTN, HLDm IDDM, NICM, hx of VT and AFib w/CRT-D, DRI (IV), hypothyroidism, hx of pericardial window, admitted with AFib RVR.  Device  information: MDT CRT-D, initial implant 2005, gen change 2016  Assessment & Plan    1. Paroxysmal AFib w/RVR     DCCV yesterday with ERAF     CHA2DS2Vasc is 5, on warfarin     INR 2.63 today  DCCV 08/06/17 to SR though had ERAF  No good AAD options given CKD, CM and true amiodarone allergy (rash/skin changes) Resume dilt gtt for rate control, to keep SBP >= 48mmHg  AVNode ablation planned for today Pt states Dr. Caryl Comes discussed procedure at length with her yesterday, she hasno follow up questions, agreeable to proceed  ? RA lead echodensity last year, no fever, WBC 12.6  >> 11.4 today Echo today is pending this AM  2. NICM     No SOB, does not appear overtly overloaded by exam today     BNP 846, given renal disease, hard to weigh this result     S/p lasix 80mg  IV once on admission       3. CKD (IV)     Up from baseline, 2.3-3 is where she has been in the last 2-3 months     A bit better today 3.36  4. Hypothyroidism     TSH is high at 18.926     Medicine is addressing  5. Hyponatremia     Up to 131 today (from 127)     Medicine is addressing     For questions or updates, please contact [place] Please consult [email or web address] for contact info under Cardiology/STEMI.      Signed, Baldwin Jamaica, PA-C  08/08/2017, 8:05 AM    I have seen, examined the patient, and reviewed the above assessment and plan.  Changes to above are made where necessary.  On exam, tachycardic irregular rhythm.  No symptoms of systemic infection.  Pt with refractory afib.   She is s/p prior afib ablation in [place].  She does not have any AAD options due to renal failure and rash with amiodarone. I did discuss options of repeat Afib ablation and also AV nodal ablation.  Given that her symptoms are primarily due to palpitations/ rapid heart rate, I think that she would do well with AV nodal ablation as advised by Dr Caryl Comes. Risks and benefits to both AV nodal ablation or repeat PVI were discussed at length today.  She understands risks and is very clear in her decision to proceed with AV nodal ablation at this time.  BiV ICD interrogation is personally reviewed and device is functioning normally.  Awaiting echo today.  If echo is not significantly changed from prior, will proceed with AV nodal ablation.    Co Sign: Thompson Grayer, MD 08/08/2017 10:55 AM

## 2017-08-08 NOTE — Progress Notes (Addendum)
Progress Note  Patient Name: Emily [name] Date of Encounter: 08/08/2017  Primary Cardiologist: Dr. Bronson Ing Electrophysiologist: Dr. Lovena Le  Subjective   Tired, "just can't sleep well in AFib", aware of fast beating, no CP, no SOB  Inpatient Medications    Scheduled Meds: . insulin aspart  0-9 Units Subcutaneous TID WC  . levothyroxine  88 mcg Oral QAC breakfast  . metoprolol succinate  100 mg Oral Daily  . pantoprazole  40 mg Oral Daily  . propofol  0.5 mg/kg Intravenous Once  . simvastatin  20 mg Oral q1800  . warfarin  4 mg Oral Once per day on Sun Thu Sat   And  . warfarin  2 mg Oral Once per day on Mon Tue Wed Fri  . Warfarin - Physician Dosing Inpatient   Does not apply q1800   Continuous Infusions: . sodium chloride 50 mL/hr at 08/08/17 0651  . diltiazem (CARDIZEM) infusion 7.5 mg/hr (08/08/17 0700)   PRN Meds: acetaminophen   Vital Signs    Vitals:   08/07/17 2120 08/07/17 2344 08/08/17 0442 08/08/17 0445  BP: (!) 100/50 100/77  110/90  Pulse:  96    Resp:  19    Temp:  98.3 F (36.8 C)  98.4 F (36.9 C)  TempSrc:  Oral  Oral  SpO2:  97%    Weight:   139 lb 6.4 oz (63.2 kg)   Height:        Intake/Output Summary (Last 24 hours) at 08/08/2017 0805 Last data filed at 08/08/2017 0700 Gross per 24 hour  Intake 1232.82 ml  Output 200 ml  Net 1032.82 ml   Filed Weights   08/06/17 1007 08/07/17 1424 08/08/17 0442  Weight: 135 lb (61.2 kg) 138 lb 6.4 oz (62.8 kg) 139 lb 6.4 oz (63.2 kg)    Telemetry    AFib 100-110's - Personally Reviewed  ECG    Post DCCV SR 60bpm, PR 151ms - Personally Reviewed  Physical Exam   GEN: No acute distress, talking on phone comfortably as I enter.   Neck: No JVD Cardiac: iRRR, tachycardic, 1+ SM, rubs, or gallops.  Respiratory: CTA b/l. GI: Soft, nontender, non-distended  MS: No edema; No deformity. Neuro:  Nonfocal  Psych: Normal affect   Labs    Chemistry Recent Labs  Lab 08/06/17 1254  08/06/17 2021 08/07/17 0417 08/08/17 0605  NA  --  127* 130* 131*  K  --  4.4 4.3 4.2  CL  --  96* 99* 99*  CO2  --  12* 17* 20*  GLUCOSE  --  221* 202* 175*  BUN  --  47* 51* 54*  CREATININE  --  3.29* 3.45* 3.36*  CALCIUM  --  8.2* 8.0* 8.0*  PROT 7.4  --   --   --   ALBUMIN 3.6  --   --   --   AST 19  --   --   --   ALT 17  --   --   --   ALKPHOS 77  --   --   --   BILITOT 1.9*  --   --   --   GFRNONAA  --  13* 13* 13*  GFRAA  --  16* 15* 15*  ANIONGAP  --  19* 14 12     Hematology Recent Labs  Lab 08/06/17 1004 08/07/17 0417  WBC 12.6* 11.4*  RBC 3.93 3.51*  HGB 12.4 10.7*  HCT 37.7 32.0*  MCV 95.9 91.2  MCH 31.6 30.5  MCHC 32.9 33.4  RDW 13.6 13.3  PLT 250 248    Cardiac EnzymesNo results for input(s): TROPONINI in the last 168 hours.  Recent Labs  Lab 08/06/17 1300  TROPIPOC 0.03     BNP Recent Labs  Lab 08/06/17 1150  BNP 846.2*     DDimer No results for input(s): DDIMER in the last 168 hours.   Radiology    Dg Chest 2 View Result Date: 08/06/2017 CLINICAL DATA:  Shortness of breath with cardiac arrhythmia EXAM: CHEST - 2 VIEW COMPARISON:  April 24, 2003 FINDINGS: There is a small left pleural effusion. There is no edema or consolidation. There is apparent nipple shadow on the right. Heart is mildly enlarged with pulmonary vascularity normal. Pacemaker leads are attached the right atrium, right ventricle, coronary sinus. There is aortic atherosclerosis. No adenopathy. No evident bone lesions. IMPRESSION: Small left pleural effusion.  No edema or consolidation. Cardiac prominence, stable. Pacemaker leads as described. There is aortic atherosclerosis. Aortic Atherosclerosis (ICD10-I70.0). Electronically Signed   By: Lowella Grip III M.D.   On: 08/06/2017 10:35    Cardiac Studies   08/11/16 TTE (Done at [facility]) LVEF 20-25% Eccentric LVEF WMA septal motion c/w pacing, and akinesis of AW, svere hypokinesis remaining segments RLV  diastolic filling pattern is restrictive Evidence of residual ASD by color flow (L>R) Small mobile density attached to pacer leas in RA: DDX includes fibrous adhesions, vegetation Mod MR (LA 4cm, volume 51.64ml) Mod-severe TR PA 45mmHg  Patient Profile     67 y.o. female with PMHx of HTN, HLDm IDDM, NICM, hx of VT and AFib w/CRT-D, DRI (IV), hypothyroidism, hx of pericardial window, admitted with AFib RVR.  Device  information: MDT CRT-D, initial implant 2005, gen change 2016  Assessment & Plan    1. Paroxysmal AFib w/RVR     DCCV yesterday with ERAF     CHA2DS2Vasc is 5, on warfarin     INR 2.63 today  DCCV 08/06/17 to SR though had ERAF  No good AAD options given CKD, CM and true amiodarone allergy (rash/skin changes) Resume dilt gtt for rate control, to keep SBP >= 41mmHg  AVNode ablation planned for today Pt states Dr. Caryl Comes discussed procedure at length with her yesterday, she hasno follow up questions, agreeable to proceed  ? RA lead echodensity last year, no fever, WBC 12.6  >> 11.4 today Echo today is pending this AM  2. NICM     No SOB, does not appear overtly overloaded by exam today     BNP 846, given renal disease, hard to weigh this result     S/p lasix 80mg  IV once on admission       3. CKD (IV)     Up from baseline, 2.3-3 is where she has been in the last 2-3 months     A bit better today 3.36  4. Hypothyroidism     TSH is high at 18.926     Medicine is addressing  5. Hyponatremia     Up to 131 today (from 127)     Medicine is addressing     For questions or updates, please contact [place] Please consult [email or web address] for contact info under Cardiology/STEMI.      Signed, Baldwin Jamaica, PA-C  08/08/2017, 8:05 AM    I have seen, examined the patient, and reviewed the above assessment and plan.  Changes to above are made where necessary.  On exam, tachycardic irregular rhythm.  No symptoms of systemic infection.  Pt with refractory afib.   She is s/p prior afib ablation in [name].  She does not have any AAD options due to renal failure and rash with amiodarone. I did discuss options of repeat Afib ablation and also AV nodal ablation.  Given that her symptoms are primarily due to palpitations/ rapid heart rate, I think that she would do well with AV nodal ablation as advised by Dr Emily [name] Comes. Risks and benefits to both AV nodal ablation or repeat PVI were discussed at length today.  She understands risks and is very clear in her decision to proceed with AV nodal ablation at this time.  BiV ICD interrogation is personally reviewed and device is functioning normally.  Awaiting echo today.  If echo is not significantly changed from prior, will proceed with AV nodal ablation.    Co Sign: Thompson Grayer, MD 08/08/2017 10:55 AM

## 2017-08-08 NOTE — Consult Note (Signed)
            [facility] CM Primary Care Navigator  08/08/2017  Emily [name] Apr 29, 1950 [id number]   Attempt to see patient in the room to identify possible discharge but staff reports that patient is off the unit for a procedure. (AV node ablation)   Will attempt to see patientat another time when available in the room.    Addendum (08/10/17):  Wentback toseepatient at the bedsideto identify possible discharge needs butshe was alreadydischargedhome yesterday per staff.   PerMD note, patientwas seen for shortness of breathand hyponatremia. (atrial fibrillation, HF,DM, worsening hypothyroidism)  Patient hasdischarge instruction to follow-up with primary care provider in 1 week; cardiology follow-up on 08/21/17.  Primary care provider's office called [name] for [name]) to notify ofpatient's discharge, need for post hospital follow-up and transition of care (TOC). Notified ofpatient's health issues needing close follow-up (mainly congestive HF;DM and a-fib).  Made aware to refer patient to [facility] care management if deemed necessaryandappropriate for any services.    For additional questions please contact:  Edwena Felty A. Kieli Golladay, BSN, RN-BC [facility] [place] PRIMARY CARE Navigator Cell: [phone]

## 2017-08-08 NOTE — Progress Notes (Addendum)
Family Medicine Teaching Service Daily Progress Note Intern Pager: [phone]  Patient name: Emily [name] Medical record number: [id number] Date of birth: May 19, 1950 Age: 67 y.o. Gender: female  Primary Care Provider: Celene Squibb, MD Consultants: cardiology Code Status: partial (DNI)  Pt Overview and Major Events to Date:  Admitted on 6/9 after symptomatic atrial fibrillation, was cardioverted 6/10 back in atrial fibrillation 6/11 scheduled for AV nodal ablation and echo  Assessment and Plan:  Emily [name] is a 67 y.o. female presenting with shortness of breath and hyponatremia. PMH is significant for HFrEF s/p ICD, atrial fibrillation, CKD, T2DM, HLD, and hypothyroidism.    Atrial fibrillation, uncontrolled: Some association has been thought to exist between hypothyroidism and atrial fibrillation, but no solid evidence supports this. HR has gone has high has 145 bpm over the past 24 hours, last HR 119.  Currently on diltiazem drip, rate 7.5 ml/hr.  Medications for rhythm and rate control are limited due to patient's CKD4 and amiodarone allergy.  Will undergo AV ablation after echo on 6/11. - continue home Toprol XL 100 mg daily - cardiology consult, appreciate recommendations - continue home Warfarin dose, 4 mg MTWF, 2 mg other days of the week - tachycardia may be exacerbated by dehydration; being given gentle NS at 50 cc/hr  Nausea and vomiting, improved: Likely 2/2 to cardioversion.  QTc has ranged from 510-580, so will use zofran judiciously.  - will d/c zofran for now since patient's last dose was at 2000 on 6/9  Hyponatremia, likely chronic: Patient presented with hyponatremia to 129, is 131 on 6/11.  No AMS or other symptoms of hyponatremia.  Likely due to under treated hypothyroidism.  Serum osmolality is high at 299, indicating possible hypovolemic hyponatremia. - NS infusion at 50 cc/hr - monitor fluid status - obtain urine osmolality, urine sodium  HFrEF: EF  20-25% in June 2018.  BNP 846 on admission although no overt signs of fluid overload, and Stage 4 CKD is contributing to poor clearance of BNP. - strict I's and O's - fluid restricted diet - daily weights - repeat echo scheduled for 6/11 - continue metoprolol 100 mg daily - consider adding ACEI/ARB and spironolactone for chronic management, although CKD 4 may be prohibitive  CKD Stage 4: Creatinine is 3.06>3.45>3.36 on 6/11.  Around baseline.   - daily BMP  T2DM: At home on Lantus 24 units QHS, Humalog 6 units before dinner, and Tujeo.  A1c of 8.3 on 6/9.  CBGs ranged from 148-274 over last 24 hours. - hold home medications - sSSI - CBGs TID AC  Acquired Hypothyroidism, worsening:  TSH 19 on 6/9, patient reporting recent change from 88 mcg synthroid to 50 with subsequent weight gain and cold intolerance.  Could be source of atrial fibrillation, although would be more likely for hyperthyroidism to cause this. - synthroid 88 mcg - last TSH reading from PCP was 0.083 in November 2018, so patient has had a significant shift in her metabolic rate in the last few months  FEN/GI: renal/carb mod diet Prophylaxis: warfarin  Disposition: continue in stepdown; may be able to discharge on 6/12  Subjective:  Patient says she didn't sleep well because her heart was pounding all night.  She understands that she will get an echo and AV nodal ablation today.  Objective: Temp:  [97.9 F (36.6 C)-98.4 F (36.9 C)] 98.4 F (36.9 C) (06/11 0445) Pulse Rate:  [96-137] 96 (06/10 2344) Resp:  [16-26] 19 (06/10 2344) BP: (87-119)/(45-98) 110/90 (06/11  0445) SpO2:  [95 %-100 %] 97 % (06/10 2344) Weight:  [138 lb 6.4 oz (62.8 kg)-139 lb 6.4 oz (63.2 kg)] 139 lb 6.4 oz (63.2 kg) (06/11 0442) Physical Exam: General: NAD, lying in bed, tired Cardiovascular: tachycardic, irregular rhythm, no MRG Respiratory: CTAB, no wheezing, rales, or rhonchi Abdomen: soft, nontender to palpation Extremities: warm  and well perfused, normal ROM  Laboratory: Recent Labs  Lab 08/06/17 1004 08/07/17 0417  WBC 12.6* 11.4*  HGB 12.4 10.7*  HCT 37.7 32.0*  PLT 250 248   Recent Labs  Lab 08/06/17 1004 08/06/17 1254 08/06/17 2021 08/07/17 0417  NA 129*  --  127* 130*  K 4.5  --  4.4 4.3  CL 99*  --  96* 99*  CO2 18*  --  12* 17*  BUN 46*  --  47* 51*  CREATININE 3.06*  --  3.29* 3.45*  CALCIUM 8.2*  --  8.2* 8.0*  PROT  --  7.4  --   --   BILITOT  --  1.9*  --   --   ALKPHOS  --  77  --   --   ALT  --  17  --   --   AST  --  19  --   --   GLUCOSE 220*  --  221* 202*    Imaging/Diagnostic Tests: Dg Chest 2 View  Result Date: 08/06/2017 CLINICAL DATA:  Shortness of breath with cardiac arrhythmia EXAM: CHEST - 2 VIEW COMPARISON:  April 24, 2003 FINDINGS: There is a small left pleural effusion. There is no edema or consolidation. There is apparent nipple shadow on the right. Heart is mildly enlarged with pulmonary vascularity normal. Pacemaker leads are attached the right atrium, right ventricle, coronary sinus. There is aortic atherosclerosis. No adenopathy. No evident bone lesions. IMPRESSION: Small left pleural effusion.  No edema or consolidation. Cardiac prominence, stable. Pacemaker leads as described. There is aortic atherosclerosis. Aortic Atherosclerosis (ICD10-I70.0). Electronically Signed   By: Lowella Grip III M.D.   On: 08/06/2017 10:35     Kathrene Alu, MD 08/08/2017, 7:06 AM PGY-1, [place] Intern pager: [phone], text pages welcome

## 2017-08-08 NOTE — Interval H&P Note (Signed)
History and Physical Interval Note:  08/08/2017 1:29 PM  Emily [name]  has presented today for surgery, with the diagnosis of afib  The various methods of treatment have been discussed with the patient and family. After consideration of risks, benefits and other options for treatment, the patient has consented to  Procedure(s): AV NODE ABLATION (N/A) as a surgical intervention .  The patient's history has been reviewed, patient examined, no change in status, stable for surgery.  I have reviewed the patient's chart and labs.  Questions were answered to the patient's satisfaction.    Risk, benefits, and alternatives to AV nodal ablation were also discussed in detail today. These risks include but are not limited to stroke, bleeding, vascular damage, tamponade, perforation, damage to the heart and other structures, defibrillator lead dislodgement, worsening renal function, and death. The patient understands these risk and wishes to proceed. She also understands that she will become device dependant following the procedure.  We will therefore proceed with catheter ablation at the next available time.    Thompson Grayer

## 2017-08-09 ENCOUNTER — Encounter (HOSPITAL_COMMUNITY): Payer: Self-pay | Admitting: Internal Medicine

## 2017-08-09 ENCOUNTER — Ambulatory Visit (HOSPITAL_COMMUNITY): Payer: PPO | Admitting: Nurse Practitioner

## 2017-08-09 LAB — BASIC METABOLIC PANEL
ANION GAP: 9 (ref 5–15)
BUN: 53 mg/dL — ABNORMAL HIGH (ref 6–20)
CALCIUM: 7.7 mg/dL — AB (ref 8.9–10.3)
CO2: 17 mmol/L — AB (ref 22–32)
Chloride: 104 mmol/L (ref 101–111)
Creatinine, Ser: 3 mg/dL — ABNORMAL HIGH (ref 0.44–1.00)
GFR, EST AFRICAN AMERICAN: 17 mL/min — AB (ref >60)
GFR, EST NON AFRICAN AMERICAN: 15 mL/min — AB (ref >60)
Glucose, Bld: 192 mg/dL — ABNORMAL HIGH (ref 65–99)
POTASSIUM: 4.3 mmol/L (ref 3.5–5.1)
Sodium: 130 mmol/L — ABNORMAL LOW (ref 135–145)

## 2017-08-09 LAB — CBC
HEMATOCRIT: 30.1 % — AB (ref 36.0–46.0)
Hemoglobin: 10.1 g/dL — ABNORMAL LOW (ref 12.0–15.0)
MCH: 30.9 pg (ref 26.0–34.0)
MCHC: 33.6 g/dL (ref 30.0–36.0)
MCV: 92 fL (ref 78.0–100.0)
PLATELETS: 265 10*3/uL (ref 150–400)
RBC: 3.27 MIL/uL — AB (ref 3.87–5.11)
RDW: 13.7 % (ref 11.5–15.5)
WBC: 8.8 10*3/uL (ref 4.0–10.5)

## 2017-08-09 LAB — PROTIME-INR
INR: 2.78
Prothrombin Time: 29.1 seconds — ABNORMAL HIGH (ref 11.4–15.2)

## 2017-08-09 LAB — GLUCOSE, CAPILLARY
GLUCOSE-CAPILLARY: 198 mg/dL — AB (ref 65–99)
Glucose-Capillary: 144 mg/dL — ABNORMAL HIGH (ref 65–99)

## 2017-08-09 MED ORDER — LEVOTHYROXINE SODIUM 88 MCG PO TABS: 88.0000 ug | ORAL_TABLET | Freq: Every day | ORAL | 0 refills | Status: DC

## 2017-08-09 NOTE — Discharge Instructions (Signed)
It was a pleasure taking care of you, Ms. Emily [name]!  The only medication change we are making is to increase your Synthroid dose to 88 mcg per day.  This may need further adjustment in the future.  Please follow up with Dr. Nevada Crane in the next 1-2 weeks, then you can see me - we have made an appointment for you on July 19 at 2:10PM since that is my first opening.  Please also follow up with cardiology as instructed.  I hope you feel better each day and I look forward to taking care of you in the future!  Post procedure care instructions No driving for 4 weeks. No lifting over 5 lbs for 1 week. No vigorous or sexual activity for 1 week. You may return to work in one week. Keep procedure site clean & dry. If you notice increased pain, swelling, bleeding or pus, call/return!  You may shower, but no soaking baths/hot tubs/pools for 1 week.

## 2017-08-09 NOTE — Care Management Important Message (Signed)
Important Message  Patient Details  Name: Emily [name] MRN: [id number] Date of Birth: [birth year]   Medicare Important Message Given:  Yes    Erenest Rasher, RN 08/09/2017, 12:35 PM

## 2017-08-09 NOTE — Progress Notes (Signed)
Patient was able to get out of bed to the bathroom w/o any problems.  Although she stated she was a bit weak in the legs but was abl to tolerate mobility.  I will keep monitoring patient.

## 2017-08-09 NOTE — Progress Notes (Signed)
SUBJECTIVE: The patient is doing well today.  At this time, she denies chest pain, shortness of breath, or any new concerns.  . insulin aspart  0-9 Units Subcutaneous TID WC  . levothyroxine  88 mcg Oral QAC breakfast  . metoprolol succinate  100 mg Oral Daily  . pantoprazole  40 mg Oral Daily  . simvastatin  20 mg Oral q1800  . sodium chloride flush  3 mL Intravenous Q12H  . warfarin  4 mg Oral Once per day on Sun Thu Sat   And  . warfarin  2 mg Oral Once per day on Mon Tue Wed Fri  . Warfarin - Physician Dosing Inpatient   Does not apply q1800   . sodium chloride      OBJECTIVE: Physical Exam: Vitals:   08/08/17 2025 08/08/17 2300 08/09/17 0418 08/09/17 0843  BP: (!) 104/56 102/65 118/67 107/69  Pulse: 86 82 81 80  Resp: 17 16 17    Temp: 98.9 F (37.2 C)  98.6 F (37 C) 98.9 F (37.2 C)  TempSrc: Oral  Oral Oral  SpO2:  97% 95% 97%  Weight:   140 lb 8 oz (63.7 kg)   Height:        Intake/Output Summary (Last 24 hours) at 08/09/2017 0938 Last data filed at 08/09/2017 0736 Gross per 24 hour  Intake 196 ml  Output 600 ml  Net -404 ml    Telemetry reveals sinus rhythm  GEN- The patient is well appearing, alert and oriented x 3 today.   Head- normocephalic, atraumatic Eyes-  Sclera clear, conjunctiva pink Ears- hearing intact Oropharynx- clear Neck- supple,  Lungs- Clear to ausculation bilaterally, normal work of breathing Heart- Regular rate and rhythm, (paced) not laterally displaced GI- soft, NT, ND, + BS Extremities- no clubbing, cyanosis, + dependant edema Skin- no rash or lesion Psych- euthymic mood, full affect Neuro- strength and sensation are intact  LABS: Basic Metabolic Panel: Recent Labs    08/08/17 0605 08/09/17 0414  NA 131* 130*  K 4.2 4.3  CL 99* 104  CO2 20* 17*  GLUCOSE 175* 192*  BUN 54* 53*  CREATININE 3.36* 3.00*  CALCIUM 8.0* 7.7*   Liver Function Tests: Recent Labs    08/06/17 1254  AST 19  ALT 17  ALKPHOS 77    BILITOT 1.9*  PROT 7.4  ALBUMIN 3.6   Recent Labs    08/06/17 1254  LIPASE 49   CBC: Recent Labs    08/07/17 0417 08/09/17 0414  WBC 11.4* 8.8  HGB 10.7* 10.1*  HCT 32.0* 30.1*  MCV 91.2 92.0  PLT 248 265   Cardiac Enzymes: No results for input(s): CKTOTAL, CKMB, CKMBINDEX, TROPONINI in the last 72 hours. BNP: Invalid input(s): POCBNP D-Dimer: No results for input(s): DDIMER in the last 72 hours. Hemoglobin A1C: Recent Labs    08/06/17 2021  HGBA1C 8.3*   Fasting Lipid Panel: No results for input(s): CHOL, HDL, LDLCALC, TRIG, CHOLHDL, LDLDIRECT in the last 72 hours. Thyroid Function Tests: Recent Labs    08/06/17 2021  TSH 18.926*   Anemia Panel: No results for input(s): VITAMINB12, FOLATE, FERRITIN, TIBC, IRON, RETICCTPCT in the last 72 hours.  RADIOLOGY: Dg Chest 2 View  Result Date: 08/06/2017 CLINICAL DATA:  Shortness of breath with cardiac arrhythmia EXAM: CHEST - 2 VIEW COMPARISON:  April 24, 2003 FINDINGS: There is a small left pleural effusion. There is no edema or consolidation. There is apparent nipple shadow on the right. Heart is mildly  enlarged with pulmonary vascularity normal. Pacemaker leads are attached the right atrium, right ventricle, coronary sinus. There is aortic atherosclerosis. No adenopathy. No evident bone lesions. IMPRESSION: Small left pleural effusion.  No edema or consolidation. Cardiac prominence, stable. Pacemaker leads as described. There is aortic atherosclerosis. Aortic Atherosclerosis (ICD10-I70.0). Electronically Signed   By: Lowella Grip III M.D.   On: 08/06/2017 10:35    ASSESSMENT AND PLAN:  Active Problems:   Acute on chronic systolic CHF (congestive heart failure) ([place])   ICD (implantable cardioverter-defibrillator) in place   Atrial fibrillation ([place])   1. Persistent afib with RVR Doing well s/p AV nodal ablation Normal device function this am. Continue coumadin  No further inpatient EP follow-up  planned OK to discharge from my standpoint Follow-up with me in the office in 7-10 days  I will return her care to Dr Lovena Le in [place] at that time.  Thompson Grayer, MD 08/09/2017 9:38 AM

## 2017-08-09 NOTE — Progress Notes (Signed)
Family Medicine Teaching Service Daily Progress Note Intern Pager: [phone]  Patient name: Emily [name] Medical record number: [id number] Date of birth: 05-01-1950 Age: 67 y.o. Gender: female  Primary Care Provider: Celene Squibb, MD Consultants: cardiology Code Status: partial (DNI)  Pt Overview and Major Events to Date:  Admitted on 6/9 after symptomatic atrial fibrillation, was cardioverted 6/10 back in atrial fibrillation 6/11 AV nodal ablation and echo performed; ablation successful  Assessment and Plan:  Emily [name] is a 67 y.o. female presenting with shortness of breath and hyponatremia. PMH is significant for HFrEF s/p ICD, atrial fibrillation, CKD, T2DM, HLD, and hypothyroidism.    Atrial fibrillation, resolved: Patient underwent AV nodal ablation and is now in sinus rhythm.  Her symptoms are much improved on 6/12.  Approved for discharge today per cardiology. - continue home Toprol XL 100 mg daily - continue home Warfarin dose, 4 mg MTWF, 2 mg other days of the week  Hyponatremia, likely chronic: Patient presented with hyponatremia to 129, is 130 on 6/12.  No AMS or other symptoms of hyponatremia.  Likely due to under treated hypothyroidism, although this may be patient's normal.  Serum osmolality is high at 299, urine sodium is normal at <10, urine osmolality is wnl at 388, showing that she is conserving sodium and likely dry. - monitor fluid status  HFrEF: EF 10-15% on 6/11.  BNP 846 on admission although no overt signs of fluid overload, and Stage 4 CKD is contributing to poor clearance of BNP. - strict I's and O's - fluid restricted diet - daily weights - repeat echo scheduled for 6/11 - continue metoprolol 100 mg daily - consider adding ACEI/ARB and spironolactone for chronic management, although CKD 4 may be prohibitive  CKD Stage 4: Creatinine is 3.06>3.45>3.36 on 6/11.  Around baseline.   - daily BMP  T2DM: At home on Lantus 24 units QHS, Humalog 6  units before dinner, and Tujeo.  A1c of 8.3 on 6/9.  CBGs ranged from 148-274 over last 24 hours. - hold home medications - sSSI - CBGs TID AC  Acquired Hypothyroidism, worsening:  TSH 19 on 6/9, patient reporting recent change from 88 mcg synthroid to 50 with subsequent weight gain and cold intolerance.  Could be source of atrial fibrillation, although would be more likely for hyperthyroidism to cause this. - synthroid 88 mcg - last TSH reading from PCP was 0.083 in November 2018, so patient has had a significant shift in her metabolic rate in the last few months  FEN/GI: renal/carb mod diet Prophylaxis: warfarin  Disposition: discharge today  Subjective:  Patient says she feels much improved today after her ablation yesterday.  She is ready to go home and understands that she will need to take an increased dose of her Synthroid after discharge.  Objective: Temp:  [98.6 F (37 C)-98.9 F (37.2 C)] 98.9 F (37.2 C) (06/12 0843) Pulse Rate:  [0-281] 80 (06/12 0843) Resp:  [0-44] 17 (06/12 0418) BP: (77-118)/(47-71) 107/69 (06/12 0843) SpO2:  [0 %-99 %] 97 % (06/12 0843) Weight:  [140 lb 8 oz (63.7 kg)] 140 lb 8 oz (63.7 kg) (06/12 0418) Physical Exam: General: NAD, lying in bed, eating breakfast and pleasant Cardiovascular: regular rhythm, normal rate, no MRG Respiratory: CTAB, no wheezing, rales, or rhonchi Abdomen: soft, nontender to palpation Extremities: warm and well perfused, normal ROM, no edema  Laboratory: Recent Labs  Lab 08/06/17 1004 08/07/17 0417 08/09/17 0414  WBC 12.6* 11.4* 8.8  HGB 12.4 10.7*  10.1*  HCT 37.7 32.0* 30.1*  PLT 250 248 265   Recent Labs  Lab 08/06/17 1254  08/07/17 0417 08/08/17 0605 08/09/17 0414  NA  --    < > 130* 131* 130*  K  --    < > 4.3 4.2 4.3  CL  --    < > 99* 99* 104  CO2  --    < > 17* 20* 17*  BUN  --    < > 51* 54* 53*  CREATININE  --    < > 3.45* 3.36* 3.00*  CALCIUM  --    < > 8.0* 8.0* 7.7*  PROT 7.4  --   --    --   --   BILITOT 1.9*  --   --   --   --   ALKPHOS 77  --   --   --   --   ALT 17  --   --   --   --   AST 19  --   --   --   --   GLUCOSE  --    < > 202* 175* 192*   < > = values in this interval not displayed.    Imaging/Diagnostic Tests: Dg Chest 2 View  Result Date: 08/06/2017 CLINICAL DATA:  Shortness of breath with cardiac arrhythmia EXAM: CHEST - 2 VIEW COMPARISON:  April 24, 2003 FINDINGS: There is a small left pleural effusion. There is no edema or consolidation. There is apparent nipple shadow on the right. Heart is mildly enlarged with pulmonary vascularity normal. Pacemaker leads are attached the right atrium, right ventricle, coronary sinus. There is aortic atherosclerosis. No adenopathy. No evident bone lesions. IMPRESSION: Small left pleural effusion.  No edema or consolidation. Cardiac prominence, stable. Pacemaker leads as described. There is aortic atherosclerosis. Aortic Atherosclerosis (ICD10-I70.0). Electronically Signed   By: Lowella Grip III M.D.   On: 08/06/2017 10:35     Kathrene Alu, MD 08/09/2017, 9:45 AM PGY-1, [place] Intern pager: [phone], text pages welcome

## 2017-08-09 NOTE — Progress Notes (Signed)
Post procedure activity and care instructions were discussed with the patient, follow up has been arranged.  Tommye Standard, PAc

## 2017-08-10 NOTE — Discharge Summary (Signed)
[facility] Discharge Summary  Patient name: Emily [name] Medical record number: [id number] Date of birth: April 16, 1950 Age: 67 y.o. Gender: female Date of Admission: 08/06/2017  Date of Discharge: 08/09/17 Admitting Physician: Kathrene Alu, MD  Primary Care Provider: Celene Squibb, MD Consultants: Electrophysiology  Indication for Hospitalization: atrial fibrillation with RVR  Discharge Diagnoses/Problem List:  Atrial fibrillation, resolved after AV node ablation Hyponatremia, chronic HFrEF CKD Stage 4 T2DM Iatrogenic hypothyroidism  Disposition: home  Discharge Condition: stable, improved  Discharge Exam:  General: NAD, lying in bed, eating breakfast and pleasant Cardiovascular: regular rhythm, normal rate, no MRG Respiratory: CTAB, no wheezing, rales, or rhonchi Abdomen: soft, nontender to palpation Extremities: warm and well perfused, normal ROM, no edema  Brief Hospital Course:  Ms. Balentine was admitted on 08/06/17 for atrial fibrillation with RVR with heart rate as high as the 140s.  She was put on a diltiazem drip and cardioverted on 6/9 with return to normal sinus rhythm, but she shortly reverted back to atrial fibrillation with RVR later that night.  She was given metoprolol IV as well as her home dose of Toprol XL and was placed back on a diltiazem drip.  Her soft blood pressures and allergy to amiodarone limited the rate and rhythm control drugs available to her, so electrophysiology recommended transthoracic echocardiogram with AV nodal ablation following.  Echo showed an EF of 10-15%, worse than her previous EF of 20-25% in July 2018.  AV nodal ablation was performed on 6/10 and was successful.  Patient now relies on her ICD pacemaker to initiate heart beats.  The following morning, she was in regular rhythm with a normal rate and felt much improved.  EP approved discharge, and she was stable medically, so she was discharged on 6/11 with close  outpatient follow up.  The patient's TSH was found to be elevated to 18 on admission, which was consistent with her reports of recent weight gain of 15 lbs and cold intolerance.  Her synthroid had been reduced to 50 mcg daily from 88 mcg, and her PCP records showed a TSH of 0.083 in November 2018.  It was postulated that her change in metabolic rate may have exacerbated her atrial fibrillation, but not much evidence exists to support this theory.  She was placed back on 88 mcg and told that this dose may need further adjustment in the future.  She was also found to be hyponatremic to 127, which could be chronic for her or due to her hypothyroidism.  Urine studies supported a diagnosis of hypovolemic hyponatremia, and she did not appear volume overloaded on exam or on CXR.  Her sodium improved to 130 on her last day of admission.  Issues for Follow Up:  1. Please check a BMP for this patient to monitor her sodium levels and kidney function. 2. Please check a TSH in 4-6 weeks and adjust synthroid dose as necessary. 3. Patient will follow up with EP in 1-2 weeks, which will guide further management of her HFrEF and now-resolved atrial fibrillation.    Significant Procedures: AV nodal ablation  Significant Labs and Imaging:  Recent Labs  Lab 08/06/17 1004 08/07/17 0417 08/09/17 0414  WBC 12.6* 11.4* 8.8  HGB 12.4 10.7* 10.1*  HCT 37.7 32.0* 30.1*  PLT 250 248 265   Recent Labs  Lab 08/06/17 1004 08/06/17 1254 08/06/17 2021 08/07/17 0417 08/08/17 0605 08/09/17 0414  NA 129*  --  127* 130* 131* 130*  K 4.5  --  4.4 4.3 4.2 4.3  CL 99*  --  96* 99* 99* 104  CO2 18*  --  12* 17* 20* 17*  GLUCOSE 220*  --  221* 202* 175* 192*  BUN 46*  --  47* 51* 54* 53*  CREATININE 3.06*  --  3.29* 3.45* 3.36* 3.00*  CALCIUM 8.2*  --  8.2* 8.0* 8.0* 7.7*  ALKPHOS  --  77  --   --   --   --   AST  --  19  --   --   --   --   ALT  --  17  --   --   --   --   ALBUMIN  --  3.6  --   --   --   --      Dg Chest 2 View  Result Date: 08/06/2017 CLINICAL DATA:  Shortness of breath with cardiac arrhythmia EXAM: CHEST - 2 VIEW COMPARISON:  April 24, 2003 FINDINGS: There is a small left pleural effusion. There is no edema or consolidation. There is apparent nipple shadow on the right. Heart is mildly enlarged with pulmonary vascularity normal. Pacemaker leads are attached the right atrium, right ventricle, coronary sinus. There is aortic atherosclerosis. No adenopathy. No evident bone lesions. IMPRESSION: Small left pleural effusion.  No edema or consolidation. Cardiac prominence, stable. Pacemaker leads as described. There is aortic atherosclerosis. Aortic Atherosclerosis (ICD10-I70.0). Electronically Signed   By: Lowella Grip III M.D.   On: 08/06/2017 10:35     Results/Tests Pending at Time of Discharge: none  Discharge Medications:  Allergies as of 08/09/2017      Reactions   Amiodarone Rash   Codeine Other (See Comments)   Euphoria, hallucinations   Other Palpitations   Other reaction(s): Other (See Comments) PT STATES SHE GETS REALLY HOT , DIZZY HEADED, HAS PASSED OUT TWICE , AND ITCHING WITH IV DYE Tumeric   Iodine Rash   topical      Medication List    TAKE these medications   acetaminophen 500 MG tablet Commonly known as:  TYLENOL Take 1,000 mg by mouth every 6 (six) hours as needed for mild pain.   Biotin 1000 MCG tablet Take 1,000 mcg by mouth daily.   CALCIUM 600-D PO Take 1 tablet by mouth daily.   FISH OIL PO Take 1,000 mg by mouth daily.   furosemide 40 MG tablet Commonly known as:  LASIX Take 1 tablet (40 mg total) by mouth 2 (two) times daily.   Insulin Glargine 300 UNIT/ML Sopn Inject 24 Units into the skin at bedtime.   insulin lispro 100 UNIT/ML KiwkPen Commonly known as:  HUMALOG Inject 6 Units into the skin. Take before dinner   levothyroxine 88 MCG tablet Commonly known as:  SYNTHROID, LEVOTHROID Take 1 tablet (88 mcg total) by mouth  daily before breakfast. What changed:    medication strength  how much to take   metoprolol succinate 100 MG 24 hr tablet Commonly known as:  TOPROL-XL TAKE 1 TABLET ONCE DAILY.   omeprazole 40 MG capsule Commonly known as:  PRILOSEC Take 40 mg by mouth daily.   OSTEO BI-FLEX REGULAR STRENGTH PO Take 1 tablet by mouth daily.   potassium chloride SA 20 MEQ tablet Commonly known as:  K-DUR,KLOR-CON Take 20 mEq by mouth every other day.   simvastatin 20 MG tablet Commonly known as:  ZOCOR TAKE 1 TABLET BY MOUTH ONCE DAILY AT 6 PM.   warfarin 2 MG tablet Commonly known  as:  COUMADIN Take 1 tablet on Monday, Wednesday and Friday. Take 2 tablets(4 mg) on  all other days.  As directed by physician to maintain INR level. What changed:    how much to take  how to take this  when to take this  additional instructions       Discharge Instructions: Please refer to Patient Instructions section of EMR for full details.  Patient was counseled important signs and symptoms that should prompt return to medical care, changes in medications, dietary instructions, activity restrictions, and follow up appointments.   Follow-Up Appointments: Follow-up Information    [facility] Follow up on 09/06/2017.   Specialty:  Cardiology Why:  10:30AM, routine post procedure pacemaker/defibrillator reprogramming Contact information: [address], [address] [place]       Evans Lance, MD Follow up.   Specialty:  Cardiology Why:  You will be called by Dr. Tanna Furry scheduler to make a 3 mo follow up appointment Contact information: [place] [address] [phone]        Thompson Grayer, MD Follow up on 08/21/2017.   Specialty:  Cardiology Why:  10:30AM  Contact information: [place] [address] [phone]        Kathrene Alu, MD. Go on 09/15/2017.   Specialty:   Family Medicine Why:  Please go to appointment at 2:10PM.  Please bring your medications with you.  You will need to fill out a new patient packet before the appointment, so you can pick it up anytime before that day or come to your appointment early to complete it.   Contact information: [address] [address] [place], John Z, MD. Schedule an appointment as soon as possible for a visit in 1 week(s).   Specialty:  Internal Medicine Why:  Please see Dr. Nevada Crane in the next 1-2 weeks.  The first opening I have is on July 19, so it would be good to see him between now and your first appointment with me. Contact information: [place] [facility] [address] [phone]           Kathrene Alu, MD 08/10/2017, 10:09 PM PGY-1, [place]

## 2017-08-14 ENCOUNTER — Telehealth: Payer: Self-pay | Admitting: Internal Medicine

## 2017-08-14 ENCOUNTER — Other Ambulatory Visit: Payer: Self-pay | Admitting: Family Medicine

## 2017-08-14 NOTE — Telephone Encounter (Signed)
Called patient back. She voiced concerns that how will she know if she has extra fluid around her heart.  She thinks her device has an tool to help identify extra fluid but she isn't sure. I advised that Sharman Cheek would monitor her device for this if her's is one that has the Optival feature.  Pt states no one has to call her if everything is ok.  Just call her if she is gaining extra fluid. She is not SOB and her weight is going down since her thyroid problem has been identified and being treated. I told pt I would forward to Margarita Grizzle to let her know we spoke and to call back if she has any other concerns.

## 2017-08-14 NOTE — Telephone Encounter (Signed)
New message    1) How much weight have you gained and in what time span? NO  2) If swelling, where is the swelling located? LEGS  3) Are you currently taking a fluid pill? YES  4) Are you currently SOB? NO   5) Do you have a log of your daily weights (if so, list)? 137 today, 142 last week  Have you gained 3 pounds in a day or 5 pounds in a week? NO 6) Have you traveled recently? NO

## 2017-08-14 NOTE — Telephone Encounter (Signed)
Pt is wanting to come back here or coumadin checks

## 2017-08-15 LAB — CUP PACEART REMOTE DEVICE CHECK
Battery Voltage: 2.97 V
Brady Statistic AP VS Percent: 3.04 %
Brady Statistic AS VP Percent: 16.99 %
Brady Statistic RA Percent Paced: 78.57 %
Brady Statistic RV Percent Paced: 61.81 %
Date Time Interrogation Session: 20190523083824
HighPow Impedance: 64 Ohm
Implantable Lead Implant Date: 20050218
Implantable Lead Implant Date: 20050218
Implantable Lead Location: 753858
Implantable Lead Location: 753860
Implantable Lead Model: 4194
Implantable Lead Model: 5076
Implantable Pulse Generator Implant Date: 20160413
Lead Channel Impedance Value: 399 Ohm
Lead Channel Impedance Value: 456 Ohm
Lead Channel Impedance Value: 513 Ohm
Lead Channel Impedance Value: 589 Ohm
Lead Channel Pacing Threshold Amplitude: 0.875 V
Lead Channel Pacing Threshold Amplitude: 1.25 V
Lead Channel Pacing Threshold Amplitude: 1.375 V
Lead Channel Pacing Threshold Pulse Width: 0.4 ms
Lead Channel Pacing Threshold Pulse Width: 0.4 ms
Lead Channel Sensing Intrinsic Amplitude: 1.375 mV
Lead Channel Sensing Intrinsic Amplitude: 1.375 mV
Lead Channel Setting Pacing Amplitude: 1.75 V
Lead Channel Setting Sensing Sensitivity: 0.3 mV
MDC IDC LEAD IMPLANT DT: 20050218
MDC IDC LEAD LOCATION: 753859
MDC IDC MSMT BATTERY REMAINING LONGEVITY: 63 mo
MDC IDC MSMT LEADCHNL LV IMPEDANCE VALUE: 893 Ohm
MDC IDC MSMT LEADCHNL LV PACING THRESHOLD PULSEWIDTH: 0.4 ms
MDC IDC MSMT LEADCHNL RV IMPEDANCE VALUE: 342 Ohm
MDC IDC MSMT LEADCHNL RV SENSING INTR AMPL: 3.625 mV
MDC IDC MSMT LEADCHNL RV SENSING INTR AMPL: 3.625 mV
MDC IDC SET LEADCHNL LV PACING AMPLITUDE: 2.25 V
MDC IDC SET LEADCHNL LV PACING PULSEWIDTH: 0.4 ms
MDC IDC SET LEADCHNL RV PACING AMPLITUDE: 2 V
MDC IDC SET LEADCHNL RV PACING PULSEWIDTH: 0.4 ms
MDC IDC STAT BRADY AP VP PERCENT: 78.46 %
MDC IDC STAT BRADY AS VS PERCENT: 1.51 %

## 2017-08-15 NOTE — Telephone Encounter (Signed)
Emily [name], Please call and offer her a new pt appt on 6/26 or 6/28. Thanks, Lattie Haw

## 2017-08-21 ENCOUNTER — Encounter: Payer: Self-pay | Admitting: Internal Medicine

## 2017-08-21 ENCOUNTER — Ambulatory Visit (INDEPENDENT_AMBULATORY_CARE_PROVIDER_SITE_OTHER): Payer: PPO

## 2017-08-21 ENCOUNTER — Ambulatory Visit: Payer: PPO | Admitting: Internal Medicine

## 2017-08-21 VITALS — BP 110/64 | HR 80 | Ht 63.0 in | Wt 135.0 lb

## 2017-08-21 DIAGNOSIS — Z95810 Presence of automatic (implantable) cardiac defibrillator: Secondary | ICD-10-CM

## 2017-08-21 DIAGNOSIS — I442 Atrioventricular block, complete: Secondary | ICD-10-CM

## 2017-08-21 DIAGNOSIS — I481 Persistent atrial fibrillation: Secondary | ICD-10-CM | POA: Diagnosis not present

## 2017-08-21 DIAGNOSIS — I5022 Chronic systolic (congestive) heart failure: Secondary | ICD-10-CM | POA: Diagnosis not present

## 2017-08-21 DIAGNOSIS — I4819 Other persistent atrial fibrillation: Secondary | ICD-10-CM

## 2017-08-21 DIAGNOSIS — Z79899 Other long term (current) drug therapy: Secondary | ICD-10-CM

## 2017-08-21 HISTORY — DX: Other long term (current) drug therapy: Z79.899

## 2017-08-21 LAB — CUP PACEART INCLINIC DEVICE CHECK
Battery Voltage: 2.96 V
Brady Statistic AP VS Percent: 0.01 %
Brady Statistic AS VP Percent: 91.95 %
Brady Statistic RA Percent Paced: 5.26 %
Brady Statistic RV Percent Paced: 97.58 %
Date Time Interrogation Session: 20190624115754
HighPow Impedance: 58 Ohm
Implantable Lead Implant Date: 20050218
Implantable Lead Location: 753858
Implantable Lead Location: 753859
Implantable Lead Location: 753860
Implantable Lead Model: 4194
Implantable Lead Model: 5076
Implantable Lead Model: 6947
Lead Channel Impedance Value: 361 Ohm
Lead Channel Impedance Value: 399 Ohm
Lead Channel Impedance Value: 475 Ohm
Lead Channel Impedance Value: 722 Ohm
Lead Channel Pacing Threshold Amplitude: 0.75 V
Lead Channel Pacing Threshold Amplitude: 1.25 V
Lead Channel Pacing Threshold Pulse Width: 0.4 ms
Lead Channel Pacing Threshold Pulse Width: 0.4 ms
Lead Channel Sensing Intrinsic Amplitude: 2.625 mV
Lead Channel Sensing Intrinsic Amplitude: 2.75 mV
Lead Channel Sensing Intrinsic Amplitude: 5 mV
Lead Channel Setting Pacing Amplitude: 2 V
Lead Channel Setting Pacing Amplitude: 2.25 V
Lead Channel Setting Sensing Sensitivity: 0.3 mV
MDC IDC LEAD IMPLANT DT: 20050218
MDC IDC LEAD IMPLANT DT: 20050218
MDC IDC MSMT BATTERY REMAINING LONGEVITY: 36 mo
MDC IDC MSMT LEADCHNL LV PACING THRESHOLD AMPLITUDE: 0.875 V
MDC IDC MSMT LEADCHNL LV PACING THRESHOLD PULSEWIDTH: 0.4 ms
MDC IDC MSMT LEADCHNL RV IMPEDANCE VALUE: 304 Ohm
MDC IDC MSMT LEADCHNL RV IMPEDANCE VALUE: 513 Ohm
MDC IDC MSMT LEADCHNL RV SENSING INTR AMPL: 5 mV
MDC IDC PG IMPLANT DT: 20160413
MDC IDC SET LEADCHNL LV PACING PULSEWIDTH: 0.4 ms
MDC IDC SET LEADCHNL RA PACING AMPLITUDE: 2 V
MDC IDC SET LEADCHNL RV PACING PULSEWIDTH: 0.4 ms
MDC IDC STAT BRADY AP VP PERCENT: 5.39 %
MDC IDC STAT BRADY AS VS PERCENT: 2.65 %

## 2017-08-21 MED ORDER — METOPROLOL SUCCINATE ER 50 MG PO TB24: 50.0000 mg | ORAL_TABLET | Freq: Every day | ORAL | 3 refills | Status: DC

## 2017-08-21 NOTE — Patient Instructions (Signed)
Medication Instructions:  Your physician has recommended you make the following change in your medication:  1. DECREASE Toprol to 50 mg once daily  *If you need a refill on your cardiac medications before your next appointment, please call your pharmacy*  Labwork: None ordered  Testing/Procedures: None ordered  Follow-Up: Remote monitoring is used to monitor your Pacemaker or ICD from home. This monitoring reduces the number of office visits required to check your device to one time per year. It allows [place] to keep an eye on the functioning of your device to ensure it is working properly. You are scheduled for a device check from home on 10/23/2017. You may send your transmission at any time that day. If you have a wireless device, the transmission will be sent automatically. After your physician reviews your transmission, you will receive a postcard with your next transmission date.  Your physician recommends that you schedule a follow-up appointment in: 2 months with Dr. Lovena Le in [place]  Thank you for choosing [facility]!!   Trinidad Curet, RN [phone]  Any Other Special Instructions Will Be Listed Below (If Applicable).

## 2017-08-21 NOTE — Progress Notes (Signed)
EPIC Encounter for ICM Monitoring  Patient Name: Emily [name] is a 67 y.o. female Date: 08/21/2017 Primary Care Physican: Kathrene Alu, MD Primary Cardiologist:Koneswaran Electrophysiologist:Taylor Dry Weight:133lbs (baseline 129 lbs) Bi-V Pacing:97.8%  Clinical Status (10-Aug-2017 to 21-Aug-2017)  Treated VT/VF 0 episodes   AT/AF 1 episode   Time in AT/AF 24.0 hr/day (100.0%)  Observations (1) (10-Aug-2017 to 21-Aug-2017)  Alert: AT/AF >= 6 hr for 11 days.        Heart Failure questions reviewed, pt has slight swelling today in feet due to she has been up walking more.  Weight had increased to 142 lbs and she said part of that was due to her thyroid medication was not correct.  Reviewed HF symptoms and to call if weight increases again or she has more swelling.  Pt had office visit with Dr Rayann Heman today, 08/21/2017 for follow up on AV nodal ablation.   Thoracic impedance abnormal suggesting fluid accumulation starting 07/24/2017 and fluid index crossed threshold ~08/09/2017.  Prescribed dosage: Furosemide40 mgtwice daily.Dr Vickey Huger her(04/10/17 office visit)to resume 80 mg twice daily should she gain 3 pounds in 24 hours or have increasing symptoms of chest pain and/or leg swelling.   Labs: 08/09/2017 Creatinine 3.00, BUN 53, Potassium 4.3, Sodium 130, EGFR 15-17  08/08/2017 Creatinine 3.36, BUN 54, Potassium 4.2, Sodium 131, EGFR 13-15  08/07/2017 Creatinine 3.45, BUN 51, Potassium 4.3, Sodium 130, EGFR 13-15  08/06/2017 Creatinine 3.29, BUN 47, Potassium 4.4, Sodium 127, EGFR 13-16   Recommendations:  Advised to limit salt intake to less than 2000 mg daily.  Encouraged to call for fluid symptoms.  Follow-up plan: ICM clinic phone appointment on 09/01/2017 to recheck fluid levels.  Office appointment scheduled 10/17/2017 with Dr. Lorrine Kin and 11/02/2017 with Dr Lovena Le.   Copy of ICM check sent to Dr. Lovena Le and Dr Lorrine Kin for review and  recommendations if needed.   3 month ICM trend: 08/21/2017    1 Year ICM trend:       [name], RN 08/21/2017 12:26 PM

## 2017-08-21 NOTE — Progress Notes (Signed)
PCP: Kathrene Alu, MD Primary Cardiologist: Dr Bronson Ing Primary EP: Dr Marta Antu Emily [name] is a 67 y.o. female who presents today for routine electrophysiology followup s/p AV nodal ablation.  Since her procedure, the patient reports doing reasonably well.  She continues to have CHF but feels that this is slowly improving.  Palpitations are resolved.  SOB and edema are slowly improving.  Today, she denies symptoms of chest pain, dizziness, presyncope, syncope, or ICD shocks.  The patient is otherwise without complaint today.   Past Medical History:  Diagnosis Date  . Chronic systolic dysfunction of left ventricle   . Complete heart block ([place])   . DM (diabetes mellitus) ([place])   . HTN (hypertension)   . Nonischemic cardiomyopathy ([place])   . Persistent atrial fibrillation [facility])    Past Surgical History:  Procedure Laterality Date  . ABLATION    . AV NODE ABLATION N/A 08/08/2017   Procedure: AV NODE ABLATION;  Surgeon: Thompson Grayer, MD;  Location: [facility];  Service: Cardiovascular;  Laterality: N/A;  . BREAST BIOPSY    . CARDIAC DEFIBRILLATOR PLACEMENT  2005   MDT Viva XT CRT-D BiV ICD implanted by Dr Rosaland Lao in [place]    . THYROIDECTOMY    . TOTAL VAGINAL HYSTERECTOMY      ROS- all systems are reviewed and negative except as per HPI above  Current Outpatient Medications  Medication Sig Dispense Refill  . acetaminophen (TYLENOL) 500 MG tablet Take 1,000 mg by mouth every 6 (six) hours as needed for mild pain.    . Biotin 1000 MCG tablet Take 1,000 mcg by mouth daily.     . Calcium Carb-Cholecalciferol (CALCIUM 600-D PO) Take 1 tablet by mouth daily.     . furosemide (LASIX) 40 MG tablet Take 1 tablet (40 mg total) by mouth 2 (two) times daily. 180 tablet 3  . Glucosamine-Chondroitin (OSTEO BI-FLEX REGULAR STRENGTH PO) Take 1 tablet by mouth daily.    . Insulin Glargine 300 UNIT/ML SOPN Inject  24 Units into the skin at bedtime.     . insulin lispro (HUMALOG) 100 UNIT/ML KiwkPen Inject 6 Units into the skin. Take before dinner    . levothyroxine (SYNTHROID, LEVOTHROID) 88 MCG tablet Take 1 tablet (88 mcg total) by mouth daily before breakfast. 30 tablet 0  . Omega-3 Fatty Acids (FISH OIL PO) Take 1,000 mg by mouth daily.     Marland Kitchen omeprazole (PRILOSEC) 40 MG capsule Take 40 mg by mouth daily.     . potassium chloride SA (K-DUR,KLOR-CON) 20 MEQ tablet Take 20 mEq by mouth every other day.     . simvastatin (ZOCOR) 20 MG tablet TAKE 1 TABLET BY MOUTH ONCE DAILY AT 6 PM. 28 tablet 11  . warfarin (COUMADIN) 2 MG tablet Take 1 tablet on Monday, Wednesday and Friday. Take 2 tablets(4 mg) on  all other days.  As directed by physician to maintain INR level. (Patient taking differently: Take 2-4 mg by mouth See admin instructions. 2mg  on Monday, Tuesday, Wednesday, Friday and 4 mg on Sunday, Thursday, Saturday.  As directed by physician to maintain INR level.) 135 tablet 0  . metoprolol succinate (TOPROL-XL) 50 MG 24 hr tablet Take 1 tablet (50 mg total) by mouth daily. Take with or immediately following a meal. 90 tablet 3   No current facility-administered medications for this visit.     Physical Exam: Vitals:  08/21/17 1055  BP: 110/64  Pulse: 80  Weight: 135 lb (61.2 kg)  Height: 5\' 3"  (1.6 m)    GEN- The patient is well appearing, alert and oriented x 3 today.   Head- normocephalic, atraumatic Eyes-  Sclera clear, conjunctiva pink Ears- hearing intact Oropharynx- clear Lungs- Clear to ausculation bilaterally, normal work of breathing Chest- ICD pocket is well healed Heart- Regular rate and rhythm (paced) GI- soft, NT, ND, + BS Extremities- no clubbing, cyanosis, or edema  ICD interrogation- reviewed in detail today,  See PACEART report  ekg tracing ordered today is personally reviewed and shows afib, BiV Paced,  Wt Readings from Last 3 Encounters:  08/21/17 135 lb (61.2 kg)    08/09/17 140 lb 8 oz (63.7 kg)  05/18/17 129 lb 12.8 oz (58.9 kg)    Assessment and Plan:  1.  Chronic systolic dysfunction euvolemic today Stable on an appropriate medical regimen Normal BiV ICD function See Pace Art report Followed in ICM device clinic  2. Persistent afib with RVR Doing better s/p AV nodal ablation Reduce toprol to 50mg  daily Follow-up with Dr Bronson Ing as scheduled Would repeat echo in 3 months to assess response to CRT 3 months post AV nodal ablation  3. S/p AV nodal ablation Doing well Reduce pacing rate from 80 bpm to 70 bpm and turn rate response on today Return in 2 weeks to device clinic (as scheduled) to reduce pacing rate to 60 bpm  Follow-up with Dr Lovena Le in 79months Repeat echo upon follow-up with Dr Bronson Ing in August I will see as needed going forward  Thompson Grayer MD, [facility] 08/21/2017 11:13 AM

## 2017-08-25 ENCOUNTER — Ambulatory Visit (INDEPENDENT_AMBULATORY_CARE_PROVIDER_SITE_OTHER): Payer: PPO | Admitting: *Deleted

## 2017-08-25 DIAGNOSIS — Z5181 Encounter for therapeutic drug level monitoring: Secondary | ICD-10-CM

## 2017-08-25 DIAGNOSIS — I482 Chronic atrial fibrillation, unspecified: Secondary | ICD-10-CM

## 2017-08-25 DIAGNOSIS — I513 Intracardiac thrombosis, not elsewhere classified: Secondary | ICD-10-CM

## 2017-08-25 LAB — POCT INR: INR: 1.8 — AB (ref 2.0–3.0)

## 2017-08-25 NOTE — Patient Instructions (Signed)
Take coumadin 2 tablets tonight then resume 1 tablet daily except 2 tablets on Thursdays, Saturdays and Sundays Recheck in 2 weeks

## 2017-09-01 ENCOUNTER — Ambulatory Visit (INDEPENDENT_AMBULATORY_CARE_PROVIDER_SITE_OTHER): Payer: Self-pay

## 2017-09-01 DIAGNOSIS — I5022 Chronic systolic (congestive) heart failure: Secondary | ICD-10-CM

## 2017-09-01 DIAGNOSIS — Z95810 Presence of automatic (implantable) cardiac defibrillator: Secondary | ICD-10-CM

## 2017-09-01 NOTE — Progress Notes (Signed)
EPIC Encounter for ICM Monitoring  Patient Name: Emily [name] is a 67 y.o. female Date: 09/01/2017 Primary Care Physican: Kathrene Alu, MD Primary Cardiologist:Koneswaran Electrophysiologist:Taylor Dry Weight:132lbs (baseline 129 lbs) Bi-V Pacing:99.2%  Clinical Status (22-Aug-2017 to 01-Sep-2017)  Treated VT/VF 0 episodes   AT/AF 36 episodes  Time in AT/AF 24.0 hr/day (100.0%)  Observations (1) (22-Aug-2017 to 01-Sep-2017)  Alert: AT/AF >= 6 hr for 10 days.      Heart Failure questions reviewed, pt said her feet are slightly swollen but much improved since she was in the hospital in June.  Also weight has decreased from 142 to 132 lbs.  AV node Ablation on 08/08/2017.     Thoracic impedance abnormal suggesting fluid accumulation starting 07/24/2017.  Prescribed dosage: Furosemide40 mgtwice daily.Dr Vickey Huger her(04/10/17 office visit)to resume 80 mg twice daily should she gain 3 pounds in 24 hours or have increasing symptoms of chest pain and/or leg swelling.   Labs: 08/09/2017 Creatinine 3.00, BUN 53, Potassium 4.3, Sodium 130, EGFR 15-17  08/08/2017 Creatinine 3.36, BUN 54, Potassium 4.2, Sodium 131, EGFR 13-15  08/07/2017 Creatinine 3.45, BUN 51, Potassium 4.3, Sodium 130, EGFR 13-15  08/06/2017 Creatinine 3.29, BUN 47, Potassium 4.4, Sodium 127, EGFR 13-16 Creatinine range 2.04 to 3.18 in 2018 per Care Everywhere  Recommendations:  Advised to follow Dr Court Joy recommendation at last OV and take additional Furosemide 40 mg x 1 day since she thinks the symptoms have much improved but still has a little fluid left.   Follow-up plan: ICM clinic phone appointment on 09/26/2017.  Fluid levels will be rechecked at device clinic appointment on 09/06/2017.    Appointments: Office appointment scheduled 09/06/2017 with device clinic for reprogramming and 10/17/2017 with Dr. Bronson Ing.  Copy sent to Dr Lovena Le and Dr Bronson Ing for review.   3 month  ICM trend: 09/01/2017    AT/AF    1 Year ICM trend:       [name], RN 09/01/2017 11:15 AM

## 2017-09-06 ENCOUNTER — Ambulatory Visit (INDEPENDENT_AMBULATORY_CARE_PROVIDER_SITE_OTHER): Payer: PPO | Admitting: *Deleted

## 2017-09-06 DIAGNOSIS — I5022 Chronic systolic (congestive) heart failure: Secondary | ICD-10-CM | POA: Diagnosis not present

## 2017-09-06 DIAGNOSIS — I442 Atrioventricular block, complete: Secondary | ICD-10-CM

## 2017-09-06 DIAGNOSIS — Z95810 Presence of automatic (implantable) cardiac defibrillator: Secondary | ICD-10-CM

## 2017-09-06 DIAGNOSIS — I420 Dilated cardiomyopathy: Secondary | ICD-10-CM | POA: Diagnosis not present

## 2017-09-06 LAB — CUP PACEART INCLINIC DEVICE CHECK
Battery Remaining Longevity: 33 mo
Brady Statistic AP VS Percent: 0 %
Brady Statistic AS VS Percent: 1.17 %
Date Time Interrogation Session: 20190710120243
HighPow Impedance: 60 Ohm
Implantable Lead Implant Date: 20050218
Implantable Lead Location: 753858
Implantable Lead Location: 753860
Implantable Lead Model: 6947
Implantable Pulse Generator Implant Date: 20160413
Lead Channel Impedance Value: 304 Ohm
Lead Channel Impedance Value: 399 Ohm
Lead Channel Impedance Value: 456 Ohm
Lead Channel Impedance Value: 513 Ohm
Lead Channel Pacing Threshold Amplitude: 1.25 V
Lead Channel Pacing Threshold Pulse Width: 0.6 ms
Lead Channel Setting Pacing Amplitude: 2 V
Lead Channel Setting Pacing Amplitude: 2.25 V
Lead Channel Setting Pacing Pulse Width: 0.6 ms
MDC IDC LEAD IMPLANT DT: 20050218
MDC IDC LEAD IMPLANT DT: 20050218
MDC IDC LEAD LOCATION: 753859
MDC IDC MSMT BATTERY VOLTAGE: 2.96 V
MDC IDC MSMT LEADCHNL LV IMPEDANCE VALUE: 551 Ohm
MDC IDC MSMT LEADCHNL LV IMPEDANCE VALUE: 836 Ohm
MDC IDC MSMT LEADCHNL LV PACING THRESHOLD PULSEWIDTH: 0.4 ms
MDC IDC MSMT LEADCHNL RA SENSING INTR AMPL: 3.625 mV
MDC IDC MSMT LEADCHNL RV PACING THRESHOLD AMPLITUDE: 1.25 V
MDC IDC SET LEADCHNL LV PACING PULSEWIDTH: 0.4 ms
MDC IDC SET LEADCHNL RV PACING AMPLITUDE: 2.5 V
MDC IDC SET LEADCHNL RV SENSING SENSITIVITY: 0.3 mV
MDC IDC STAT BRADY AP VP PERCENT: 1.19 %
MDC IDC STAT BRADY AS VP PERCENT: 97.65 %
MDC IDC STAT BRADY RA PERCENT PACED: 1.18 %
MDC IDC STAT BRADY RV PERCENT PACED: 98.93 %

## 2017-09-06 NOTE — Progress Notes (Signed)
CRT-D device check in office, s/p AVN ablation on 08/08/17 by JA. Sensing and impedances consistent with previous device measurements. RA and LV thresholds stable, RV threshold now 1.5V @ 0.69ms. Lower rate reduced to 60bpm per JA. 100% AT/AF burden, +warfarin, maintained DDIR per JA (plan to defer long-term programming to GT). No ventricular arrhythmia episodes recorded in monitor/therapy zones. 9 Vs episodes--likely NSVT per markers, short duration, some V rates below monitor zone detection of 133bpm. Patient bi-ventricularly pacing 98.9% of the time. Device programmed with appropriate safety margins; min RV output increased to 2.5V @ 0.77ms. Max RA/RV lead impedances increased to 2000ohms per protocol. Audible alerts demonstrated for patient. Estimated longevity 2.8 years. Patient enrolled in remote follow up. Patient education completed including shock plan. Carelink on 09/26/17, ROV with GT/R on 11/02/17.  Thoracic impedance still suggests fluid retention, weight is still up ~4lbs (129 this AM, patient reports dry weight of ~125lbs), no other symptoms. Patient has been taking furosemide 40mg  TID (AM, with dinner, QHS) since last ICM check on 09/01/17. Message routed to Sharman Cheek, RN and Dr. Bronson Ing for further recommendations.

## 2017-09-08 ENCOUNTER — Ambulatory Visit (INDEPENDENT_AMBULATORY_CARE_PROVIDER_SITE_OTHER): Payer: Self-pay

## 2017-09-08 ENCOUNTER — Telehealth: Payer: Self-pay

## 2017-09-08 DIAGNOSIS — Z95810 Presence of automatic (implantable) cardiac defibrillator: Secondary | ICD-10-CM

## 2017-09-08 DIAGNOSIS — I5022 Chronic systolic (congestive) heart failure: Secondary | ICD-10-CM

## 2017-09-08 NOTE — Telephone Encounter (Signed)
ICM call to patient following up on device transmission showing fluid accumulation at office visit on 09/06/2017.  Weight today 129 lbs and she is has been taking extra Furosemide in the evenings.  Requested a remote transmission to be sent today for review and she agreed.

## 2017-09-08 NOTE — Progress Notes (Signed)
EPIC Encounter for ICM Monitoring  Patient Name: Emily [name] is a 67 y.o. female Date: 09/08/2017 Primary Care Physican: Winfrey, Amanda C, MD Primary Cardiologist:Koneswaran Electrophysiologist:Taylor Dry Weight:129lbs (baseline 129 lbs) Bi-V Pacing:95.4%  Clinical Status (06-Sep-2017 to 08-Sep-2017)  Treated VT/VF 0 episodes   AT/AF 3 episodes   Time in AT/AF 24.0 hr/day (100.0%)  Observations (1) (06-Sep-2017 to 08-Sep-2017)  AT/AF >= 6 hr for 3 days.       Heart Failure questions reviewed, pt asymptomatic.  Weight and breathing are at baseline.  She has been taking 1 extra Furosemide at night as was instructed by her prior phyisican   Thoracic impedance improved and trending close to baseline.   Prescribed dosage: Furosemide40 mgtwice daily.Dr Koneswaraninstructed her(04/10/17 office visit)to resume 80 mg twice daily should she gain 3 pounds in 24 hours or have increasing symptoms of chest pain and/or leg swelling.   Labs: 08/09/2017 Creatinine3.00, BUN53, Potassium4.3, Sodium130, EGFR15-17  08/08/2017 Creatinine3.36, BUN54, Potassium4.2, Sodium131, EGFR13-15  08/07/2017 Creatinine3.45, BUN51, Potassium4.3, Sodium130, EGFR13-15  08/06/2017 Creatinine3.29, BUN47, Potassium4.4, Sodium127, EGFR13-16 Creatinine range 2.04 to 3.18 in 2018 per Care Everywhere  Recommendations:  Advised to return to taking Furosemide 40 mg twice a day as prescribed by Dr Koneswaran at last visit.  If she has symptoms then to call back and will ask recommendations from Dr Koneswaran.  Follow-up plan: ICM clinic phone appointment on 09/14/2017 to recheck fluid levels.   Office appointment scheduled 10/17/2017 with Dr. Koneswaran.  Copy of ICM check sent to Dr. Taylor and Dr Koneswaran for review and recommendations if needed.    3 month ICM trend: 09/08/2017    AT/AF   1 Year ICM trend:       [name], RN 09/08/2017 12:24 PM   

## 2017-09-14 ENCOUNTER — Ambulatory Visit (INDEPENDENT_AMBULATORY_CARE_PROVIDER_SITE_OTHER): Payer: PPO

## 2017-09-14 DIAGNOSIS — Z95810 Presence of automatic (implantable) cardiac defibrillator: Secondary | ICD-10-CM

## 2017-09-14 DIAGNOSIS — I5022 Chronic systolic (congestive) heart failure: Secondary | ICD-10-CM

## 2017-09-14 NOTE — Progress Notes (Signed)
EPIC Encounter for ICM Monitoring  Patient Name: Emily [name] is a 67 y.o. female Date: 09/14/2017 Primary Care Physican: Kathrene Alu, MD Primary Cardiologist:Koneswaran Electrophysiologist:Taylor Dry Weight:Previous weight 129lbs (baseline 129 lbs) Bi-V Pacing:96.6%     Clinical Status (08-Sep-2017 to 14-Sep-2017)  Treated VT/VF 0 episodes  AT/AF 3 episodes   Time in AT/AF 24.0 hr/day (100.0%) Observations (1) (08-Sep-2017 to 14-Sep-2017)  AT/AF >= 6 hr for 6 days.   Attempted call to patient and unable to reach.  Left detailed message, per DPR, regarding transmission.  Transmission reviewed.    Thoracic impedance continues to be abnormal suggesting fluid accumulation starting 07/24/2017.  Prescribed dosage: Furosemide40 mgtwice daily.Dr Vickey Huger her(04/10/17 office visit)to resume 80 mg twice daily should she gain 3 pounds in 24 hours or have increasing symptoms of chest pain and/or leg swelling.   Labs: 08/09/2017 Creatinine3.00, [name], Potassium4.3, Sodium130, [id number]  08/08/2017 Creatinine3.36, BUN54, Potassium4.2, Sodium131, [id number]  08/07/2017 Creatinine3.45, BUN51, Potassium4.3, Sodium130, [id number]  08/06/2017 Creatinine3.29, BUN47, Potassium4.4, [id number], [id number] Creatinine range 2.04 to 3.18 in 2018 per Care Everywhere  Recommendations: Left voice mail with ICM number and encouraged to call if experiencing any fluid symptoms.  Follow-up plan: ICM clinic phone appointment on 09/26/2017.   Office appointment scheduled 10/17/2017 with Dr. Bronson Ing.    Copy of ICM check sent to Dr. Lovena Le and Dr Bronson Ing for review and recommendations if needed.   3 month ICM trend: 09/14/2017    AT/AF   1 Year ICM trend:       [name], RN 09/14/2017 11:15 AM

## 2017-09-15 ENCOUNTER — Encounter: Payer: Self-pay | Admitting: Family Medicine

## 2017-09-15 ENCOUNTER — Ambulatory Visit (INDEPENDENT_AMBULATORY_CARE_PROVIDER_SITE_OTHER): Payer: PPO | Admitting: Family Medicine

## 2017-09-15 ENCOUNTER — Ambulatory Visit (INDEPENDENT_AMBULATORY_CARE_PROVIDER_SITE_OTHER): Payer: PPO | Admitting: *Deleted

## 2017-09-15 ENCOUNTER — Other Ambulatory Visit: Payer: Self-pay

## 2017-09-15 VITALS — BP 114/62 | HR 91 | Temp 98.2°F | Ht 63.0 in | Wt 130.8 lb

## 2017-09-15 DIAGNOSIS — I482 Chronic atrial fibrillation, unspecified: Secondary | ICD-10-CM

## 2017-09-15 DIAGNOSIS — I513 Intracardiac thrombosis, not elsewhere classified: Secondary | ICD-10-CM | POA: Diagnosis not present

## 2017-09-15 DIAGNOSIS — I481 Persistent atrial fibrillation: Secondary | ICD-10-CM

## 2017-09-15 DIAGNOSIS — E1122 Type 2 diabetes mellitus with diabetic chronic kidney disease: Secondary | ICD-10-CM | POA: Diagnosis not present

## 2017-09-15 DIAGNOSIS — I4819 Other persistent atrial fibrillation: Secondary | ICD-10-CM

## 2017-09-15 DIAGNOSIS — N184 Chronic kidney disease, stage 4 (severe): Secondary | ICD-10-CM | POA: Diagnosis not present

## 2017-09-15 DIAGNOSIS — E039 Hypothyroidism, unspecified: Secondary | ICD-10-CM | POA: Diagnosis not present

## 2017-09-15 DIAGNOSIS — Z7689 Persons encountering health services in other specified circumstances: Secondary | ICD-10-CM | POA: Diagnosis not present

## 2017-09-15 DIAGNOSIS — Z5181 Encounter for therapeutic drug level monitoring: Secondary | ICD-10-CM | POA: Diagnosis not present

## 2017-09-15 DIAGNOSIS — Z794 Long term (current) use of insulin: Secondary | ICD-10-CM | POA: Diagnosis not present

## 2017-09-15 DIAGNOSIS — Z1231 Encounter for screening mammogram for malignant neoplasm of breast: Secondary | ICD-10-CM | POA: Diagnosis not present

## 2017-09-15 LAB — POCT INR: INR: 2 (ref 2.0–3.0)

## 2017-09-15 NOTE — Assessment & Plan Note (Addendum)
Will obtain TSH today and adjust synthroid dose as necessary.  Patient will need a recheck of her TSH at her visit two months from now.

## 2017-09-15 NOTE — Assessment & Plan Note (Signed)
Will obtain BMP d/t CKD4, furosemide use, and potassium use.  Will obtain records release from patient's nephrologist.

## 2017-09-15 NOTE — Patient Instructions (Signed)
It was nice seeing you today Emily [name]!  Today, we will get a thyroid level and check your electrolytes and kidney function.  I will call you if anything is concerning or we need to change your medications.  I would like to see you back in two months.  If you have any questions or concerns, please feel free to call the clinic.   Be well,  Dr. Shan Levans

## 2017-09-15 NOTE — Progress Notes (Signed)
Subjective:    Emily [name] - 67 y.o. female MRN [id number]  Date of birth: 03/24/1950   Emily [name] is here to establish care.  HPI: Feeling a little stronger, no palpitations, no shortness of breath, has lost about 12 lbs.  Is working to cut back on her eating to lose weight.  ICD has not reported any fluid around her heart.  Patient also reports occasional hypoglycemic symptoms in the evening after she takes her Humalog (will feel hypoglycemic at [facility]).  PMH: periodic allergic urticaria, T2DM, HTN, atrial fibrillation, CKD IV (follows with nephrology in [place]), HFrEF  PSH: cholecystectomy, hysterectomy, thyroidectomy  Social: lives with daughter, grandson, and husband, no smoking or alcohol  FH: brothers with cancer  Medications: Lantus and Humalog, Trujeo (gets samples), Toprol, furosemide, calcium-Vit D, synthroid, potassium, simvastatin  Health Maintenance:  Needs mammogram referral; up to date on colonoscopy, dexa scan, pap Health Maintenance Due  Topic Date Due  . Hepatitis C Screening  September 23, 1950  . FOOT EXAM  09/07/1958  . OPHTHALMOLOGY EXAM  09/07/1958  . URINE MICROALBUMIN  09/07/1958  . TETANUS/TDAP  09/07/1967  . MAMMOGRAM  09/07/1998  . COLONOSCOPY  09/07/1998  . DEXA SCAN  09/06/2013  . PNA vac Low Risk Adult (1 of 2 - PCV13) 09/06/2013    -  reports that she has never smoked. She has never used smokeless tobacco. - Review of Systems: Per HPI. - Past Medical History: Patient Active Problem List   Diagnosis Date Noted  . On amiodarone therapy 08/21/2017  . Atrial fibrillation with RVR ([place])   . Atrial fibrillation ([place]) 08/06/2017  . ICD (implantable cardioverter-defibrillator) in place 12/29/2016  . Anemia 12/29/2016  . IDDM (insulin dependent diabetes mellitus) ([place]) 12/29/2016  . 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 Hypercholesteremia 12/29/2016  . Hypertension, well controlled 12/29/2016  . Primary  idiopathic dilated cardiomyopathy ([place]) 12/29/2016  . Coronary artery disease involving native coronary artery of native heart without angina pectoris 08/28/2016  . CKD (chronic kidney disease) stage 4, GFR 15-29 ml/min (HCC) 08/26/2016  . Localized macular rash 08/11/2016  . Hypothyroidism 04/11/2016  . GERD (gastroesophageal reflux disease) 04/11/2016  . Gastroenteritis 04/10/2016  . Hypomagnesemia 04/10/2016  . Long-term insulin use in type 2 diabetes ([place]) 04/10/2016  . Hyponatremia 04/10/2016  . AKI (acute kidney injury) ([place]) 04/09/2016  . Persistent atrial fibrillation ([place]) 04/05/2016  . 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 Warfarin anticoagulation 04/21/2014  . ICD (implantable cardioverter-defibrillator) battery depletion 04/21/2014  . Colitis 09/11/2012  . Acute on chronic systolic congestive heart failure ([place]) 05/02/2012  . Acute renal failure ([place]) 04/15/2012  . Dilated cardiomyopathy ([place]) 05/24/2011  . HTN (hypertension), benign 05/24/2011  . Hypercholesterolemia 05/24/2011  . ICD (implantable cardioverter-defibrillator), biventricular, in situ 05/24/2011   - Medications: reviewed and updated   Objective:   Physical Exam BP 114/62   Pulse 91   Temp 98.2 F (36.8 C) (Oral)   Ht 5\' 3"  (1.6 m)   Wt 130 lb 12.8 oz (59.3 kg)   SpO2 97%   BMI 23.17 kg/m  Gen: NAD, alert, cooperative with exam, well-appearing, pleasant HEENT: NCAT, PERRL, clear conjunctiva, supple neck CV: RRR, good S1/S2, no murmur, no edema Resp: CTABL, no wheezes, non-labored Abd: SNTND, BS present, no guarding or organomegaly Skin: no rashes, normal turgor  Neuro: no gross deficits.  Psych: good insight, alert and oriented     Assessment & Plan:   CKD (  chronic kidney disease) stage 4, GFR 15-29 ml/min (HCC) Will obtain BMP d/t CKD4, furosemide use, and potassium use.  Will obtain records release from patient's nephrologist.  Hypothyroidism Will obtain TSH today and adjust  synthroid dose as necessary.  Patient will need a recheck of her TSH at her visit two months from now.  Atrial fibrillation ([place]) Controlled by ICD after AV nodal ablation in June 2019.  Patient on warfarin; will need to ensure that she is getting her INR checked either at our clinic or at another clinic.  I'm also unsure if patient needs to continue warfarin since her heart rate and rhythm is dictated by her ICD.  Long-term insulin use in type 2 diabetes [facility]) Advised patient that she can reduce her Humalog dose if she is getting hypoglycemic symptoms in the evening.  Will be due for A1c check in two months.  Will need to see if we can continue giving her Trujeo samples here or change her diabetes regimen at that time.    Maia Breslow, M.D. 09/17/2017, 8:44 AM PGY-2, [place]

## 2017-09-15 NOTE — Patient Instructions (Signed)
Increase coumadin to 2 tablets daily except 1 tablet on Mondays, Wednesdays and Fridays Recheck in 3 weeks

## 2017-09-16 LAB — BASIC METABOLIC PANEL
BUN / CREAT RATIO: 12 (ref 12–28)
BUN: 25 mg/dL (ref 8–27)
CO2: 23 mmol/L (ref 20–29)
CREATININE: 2.12 mg/dL — AB (ref 0.57–1.00)
Calcium: 8.9 mg/dL (ref 8.7–10.3)
Chloride: 100 mmol/L (ref 96–106)
GFR calc Af Amer: 27 mL/min/{1.73_m2} — ABNORMAL LOW (ref >59)
GFR, EST NON AFRICAN AMERICAN: 23 mL/min/{1.73_m2} — AB (ref >59)
Glucose: 113 mg/dL — ABNORMAL HIGH (ref 65–99)
Potassium: 4.5 mmol/L (ref 3.5–5.2)
Sodium: 141 mmol/L (ref 134–144)

## 2017-09-16 LAB — TSH: TSH: 0.33 u[IU]/mL — ABNORMAL LOW (ref 0.450–4.500)

## 2017-09-17 ENCOUNTER — Encounter: Payer: Self-pay | Admitting: Family Medicine

## 2017-09-17 NOTE — Assessment & Plan Note (Signed)
Advised patient that she can reduce her Humalog dose if she is getting hypoglycemic symptoms in the evening.  Will be due for A1c check in two months.  Will need to see if we can continue giving her Trujeo samples here or change her diabetes regimen at that time.

## 2017-09-17 NOTE — Assessment & Plan Note (Signed)
Controlled by ICD after AV nodal ablation in June 2019.  Patient on warfarin; will need to ensure that she is getting her INR checked either at our clinic or at another clinic.  I'm also unsure if patient needs to continue warfarin since her heart rate and rhythm is dictated by her ICD.

## 2017-09-18 ENCOUNTER — Telehealth: Payer: Self-pay | Admitting: Family Medicine

## 2017-09-18 ENCOUNTER — Other Ambulatory Visit: Payer: Self-pay | Admitting: Family Medicine

## 2017-09-18 MED ORDER — LEVOTHYROXINE SODIUM 75 MCG PO TABS: 75.0000 ug | ORAL_TABLET | Freq: Every day | ORAL | 2 refills | Status: DC

## 2017-09-18 NOTE — Telephone Encounter (Signed)
Informed Emily [name] that her TSH was low, indicating that she needs to take a smaller dose of synthroid.  We will try 75 mcg daily (she is currently taking 88 mcg daily).  She says she has about 30 pills of the 75 mcg dose already since her dose has changed in the past, so she does not currently need a new prescription.  I also let her know that her kidney function is back to its baseline after having an acute injury during her June hospitalization.

## 2017-09-26 ENCOUNTER — Ambulatory Visit (INDEPENDENT_AMBULATORY_CARE_PROVIDER_SITE_OTHER): Payer: PPO

## 2017-09-26 DIAGNOSIS — Z95810 Presence of automatic (implantable) cardiac defibrillator: Secondary | ICD-10-CM

## 2017-09-26 DIAGNOSIS — I5022 Chronic systolic (congestive) heart failure: Secondary | ICD-10-CM | POA: Diagnosis not present

## 2017-09-26 NOTE — Progress Notes (Signed)
EPIC Encounter for ICM Monitoring  Patient Name: Emily [name] is a 67 y.o. female Date: 09/26/2017 Primary Care Physican: [name], Emily C, MD Primary Cardiologist:Emily [name] Electrophysiologist:Emily [name] Weight: 129lbs (baseline 129 lbs) Bi-V Pacing:97.7%  Clinical Status (14-Sep-2017 to 26-Sep-2017)  Treated VT/VF 0 episodes   AT/AF 9 episodes   Time in AT/AF 24.0 hr/day (100.0%)  Observations (1) (14-Sep-2017 to 26-Sep-2017)  AT/AF >= 6 hr for 12 days.      Heart Failure questions reviewed, pt asymptomatic.  She said she is feeling fine  AV node Ablation on 08/08/2017.     Thoracic impedance close to baseline normal.  Prescribed dosage: Furosemide40 mgtwice daily.Dr Koneswaraninstructed her(04/10/17 office visit)to resume 80 mg twice daily should she gain 3 pounds in 24 hours or have increasing symptoms of chest pain and/or leg swelling.   Labs: 08/09/2017 Creatinine3.00, BUN53, Potassium4.3, Sodium130, EGFR15-17  08/08/2017 Creatinine3.36, BUN54, Potassium4.2, Sodium131, EGFR13-15  08/07/2017 Creatinine3.45, BUN51, Potassium4.3, Sodium130, EGFR13-15  08/06/2017 Creatinine3.29, BUN47, Potassium4.4, Sodium127, EGFR13-16 Creatinine range 2.04 to 3.18 in 2018 per Care Everywhere  Recommendations: No changes.   Encouraged to call for fluid symptoms.  Follow-up plan: ICM clinic phone appointment on 10/16/2017 so can send copy to Dr Emily [name] for review at 10/17/2017 office visit.   Copy of ICM check sent to Dr. Taylor.   3 month ICM trend: 09/26/2017    AT/AF    1 Year ICM trend:       Emily S Short, RN 09/26/2017 9:31 AM   

## 2017-10-09 ENCOUNTER — Ambulatory Visit (INDEPENDENT_AMBULATORY_CARE_PROVIDER_SITE_OTHER): Payer: PPO | Admitting: *Deleted

## 2017-10-09 DIAGNOSIS — I482 Chronic atrial fibrillation, unspecified: Secondary | ICD-10-CM

## 2017-10-09 DIAGNOSIS — Z5181 Encounter for therapeutic drug level monitoring: Secondary | ICD-10-CM | POA: Diagnosis not present

## 2017-10-09 DIAGNOSIS — I513 Intracardiac thrombosis, not elsewhere classified: Secondary | ICD-10-CM

## 2017-10-09 LAB — POCT INR: INR: 1.9 — AB (ref 2.0–3.0)

## 2017-10-09 NOTE — Patient Instructions (Signed)
Increase coumadin to 2 tablets daily except 1 tablet on Sundays and Wednesdays Recheck in 3 weeks

## 2017-10-10 ENCOUNTER — Telehealth: Payer: Self-pay

## 2017-10-10 NOTE — Telephone Encounter (Signed)
Patient calling for samples of Toujeo. None in med room. Pharm students unsure if ever have samples of this. Last OV note mentions may have to adjust diabetic regimen if can't supply samples.  Note to PCP and pharmacist.  Call back is [phone]  Danley Danker, RN [facility])

## 2017-10-10 NOTE — Telephone Encounter (Signed)
I will consult with pharmacy about whether Toujeo should be continued or if patient would benefit from a different therapy.  I would like to see her back in September for an A1c check and to discuss her diabetes regimen.

## 2017-10-11 NOTE — Telephone Encounter (Signed)
Called patient and discussed possibly switching her insulin regimen from Toujeo to Victoza since she has had some hypoglycemia with insulin and Victoza would have cardiac protective effects.  Patient likes this idea and will make an appointment to see me for an A1c check and discussion of this change in the next few weeks.

## 2017-10-11 NOTE — Telephone Encounter (Signed)
Patient left message to check on status. Called back and got voicemail. Left message that PCP aware of situation and is working with pharmacist for solution.  Danley Danker, RN [facility] RN)

## 2017-10-16 ENCOUNTER — Telehealth: Payer: Self-pay

## 2017-10-16 NOTE — Telephone Encounter (Signed)
LMOVM reminding pt to send remote transmission.   

## 2017-10-17 ENCOUNTER — Ambulatory Visit: Payer: PPO | Admitting: Cardiovascular Disease

## 2017-10-17 ENCOUNTER — Encounter: Payer: Self-pay | Admitting: Cardiovascular Disease

## 2017-10-17 VITALS — BP 118/64 | HR 94 | Ht 63.0 in | Wt 130.0 lb

## 2017-10-17 DIAGNOSIS — Z95810 Presence of automatic (implantable) cardiac defibrillator: Secondary | ICD-10-CM | POA: Diagnosis not present

## 2017-10-17 DIAGNOSIS — I10 Essential (primary) hypertension: Secondary | ICD-10-CM

## 2017-10-17 DIAGNOSIS — I482 Chronic atrial fibrillation, unspecified: Secondary | ICD-10-CM

## 2017-10-17 DIAGNOSIS — N184 Chronic kidney disease, stage 4 (severe): Secondary | ICD-10-CM | POA: Diagnosis not present

## 2017-10-17 DIAGNOSIS — I5022 Chronic systolic (congestive) heart failure: Secondary | ICD-10-CM

## 2017-10-17 DIAGNOSIS — Z9889 Other specified postprocedural states: Secondary | ICD-10-CM | POA: Diagnosis not present
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
## 2017-10-17 NOTE — Patient Instructions (Addendum)
Your physician wants you to follow-up in:6 months  With Dr.Koneswaran You will receive a reminder letter in the mail two months in advance. If you don't receive a letter, please call our office to schedule the follow-up appointment.     Your physician has requested that you have a LIMITED echocardiogram. Echocardiography is a painless test that uses sound waves to create images of your heart. It provides your doctor with information about the size and shape of your heart and how well your heart's chambers and valves are working. This procedure takes approximately one hour. There are no restrictions for this procedure.   Your physician recommends that you continue on your current medications as directed. Please refer to the Current Medication list given to you today.     If you need a refill on your cardiac medications before your next appointment, please call your pharmacy.     No labs today.        Thank you for choosing [facility] !

## 2017-10-19 ENCOUNTER — Telehealth: Payer: Self-pay | Admitting: Family Medicine

## 2017-10-19 NOTE — Telephone Encounter (Signed)
Pt would like for Dr. Shan Levans to call her, she has some questions she needs to discuss before her next appointment which is 12/01/17.

## 2017-10-20 ENCOUNTER — Ambulatory Visit (HOSPITAL_COMMUNITY)
Admission: RE | Admit: 2017-10-20 | Discharge: 2017-10-20 | Disposition: A | Discharge status: Home / Self Care | Payer: PPO | Source: Ambulatory Visit | Attending: Cardiovascular Disease | Admitting: Cardiovascular Disease

## 2017-10-20 DIAGNOSIS — N179 Acute kidney failure, unspecified: Secondary | ICD-10-CM | POA: Insufficient documentation

## 2017-10-20 DIAGNOSIS — E119 Type 2 diabetes mellitus without complications: Secondary | ICD-10-CM | POA: Diagnosis not present

## 2017-10-20 DIAGNOSIS — I110 Hypertensive heart disease with heart failure: Secondary | ICD-10-CM | POA: Insufficient documentation

## 2017-10-20 DIAGNOSIS — I420 Dilated cardiomyopathy: Secondary | ICD-10-CM | POA: Diagnosis not present

## 2017-10-20 DIAGNOSIS — E7800 Pure hypercholesterolemia, unspecified: Secondary | ICD-10-CM | POA: Diagnosis not present

## 2017-10-20 DIAGNOSIS — I5023 Acute on chronic systolic (congestive) heart failure: Secondary | ICD-10-CM | POA: Insufficient documentation

## 2017-10-20 DIAGNOSIS — I358 Other nonrheumatic aortic valve disorders: Secondary | ICD-10-CM | POA: Insufficient documentation

## 2017-10-20 DIAGNOSIS — I4891 Unspecified atrial fibrillation: Secondary | ICD-10-CM | POA: Insufficient documentation

## 2017-10-20 DIAGNOSIS — I5022 Chronic systolic (congestive) heart failure: Secondary | ICD-10-CM | POA: Diagnosis not present

## 2017-10-20 NOTE — Telephone Encounter (Signed)
Left VM that I got patient's message and I will try to call her again on Monday.

## 2017-10-20 NOTE — Progress Notes (Signed)
*  PRELIMINARY RESULTS* Echocardiogram 2D Echocardiogram has been performed.  Emily [name] 10/20/2017, 12:12 PM

## 2017-10-23 ENCOUNTER — Other Ambulatory Visit: Payer: Self-pay | Admitting: Family Medicine

## 2017-10-23 ENCOUNTER — Telehealth: Payer: Self-pay | Admitting: Family Medicine

## 2017-10-23 MED ORDER — LIRAGLUTIDE 18 MG/3ML ~~LOC~~ SOPN: 1.2000 mg | PEN_INJECTOR | Freq: Every day | SUBCUTANEOUS | 2 refills | Status: DC

## 2017-10-23 NOTE — Telephone Encounter (Signed)
Ms. Doubleday says that she has a few more days left of Toujeo, and she is wondering what she should do for her medication until her appointment with me in October.  We decided to start Victoza now, and I gave her instructions (0.6 mg daily for the first week, then 1.2 mg daily after) on use.  Her daughter also uses this medication, and she can help her.  I told her that I am okay with Toujeo overlapping with Victoza, since the dose will be subtherapeutic for the first week.  I also asked her to continue taking her blood sugar so that we can adjust her medication if necessary.  According to her pharmacy, Victoza will be $45 per month with her insurance, which is a little expensive for Ms. Avena, so I will reach out to Dr. Valentina Lucks to see if we can offer her any cheaper options.

## 2017-10-23 NOTE — Progress Notes (Signed)
No ICM remote transmission received for 10/16/2017 and next ICM transmission scheduled for 12/04/2017.  Office defib check scheduled 11/02/2017 with Dr Lovena Le.

## 2017-11-02 ENCOUNTER — Ambulatory Visit: Payer: PPO | Admitting: Internal Medicine

## 2017-11-02 ENCOUNTER — Encounter: Payer: Self-pay | Admitting: Internal Medicine

## 2017-11-02 ENCOUNTER — Ambulatory Visit (INDEPENDENT_AMBULATORY_CARE_PROVIDER_SITE_OTHER): Payer: PPO | Admitting: *Deleted

## 2017-11-02 VITALS — BP 116/78 | HR 89 | Ht 64.0 in | Wt 128.0 lb

## 2017-11-02 DIAGNOSIS — I482 Chronic atrial fibrillation, unspecified: Secondary | ICD-10-CM

## 2017-11-02 DIAGNOSIS — Z5181 Encounter for therapeutic drug level monitoring: Secondary | ICD-10-CM

## 2017-11-02 DIAGNOSIS — I5022 Chronic systolic (congestive) heart failure: Secondary | ICD-10-CM

## 2017-11-02 DIAGNOSIS — I513 Intracardiac thrombosis, not elsewhere classified: Secondary | ICD-10-CM | POA: Diagnosis not present

## 2017-11-02 LAB — POCT INR: INR: 3 (ref 2.0–3.0)

## 2017-11-02 NOTE — Addendum Note (Signed)
Addended by: Barbarann Ehlers A on: 11/02/2017 09:32 AM   Modules accepted: Orders

## 2017-11-02 NOTE — Patient Instructions (Signed)
Continue coumadin 2 tablets daily except 1 tablet on Sundays and Wednesdays Recheck in 4 weeks

## 2017-11-02 NOTE — Patient Instructions (Signed)
Medication Instructions:  Your physician recommends that you continue on your current medications as directed. Please refer to the Current Medication list given to you today.   Labwork: NONE  Testing/Procedures: NONE  Follow-Up: Your physician wants you to follow-up in: [place] Lovena Le.  You will receive a reminder letter in the mail two months in advance. If you don't receive a letter, please call our office to schedule the follow-up appointment.   Any Other Special Instructions Will Be Listed Below (If Applicable).     If you need a refill on your cardiac medications before your next appointment, please call your pharmacy.

## 2017-11-02 NOTE — Progress Notes (Signed)
HPI [name] is returns today for ongoing evaluation of atrial and ventricular arrhythmias, s/p ICD insertion in the setting of a non-ischemic CM. She had her initial ICD placed in 2005. Her current device was placed in 2016. She has had atrial fib as well which has become rapid and she underwent AV node ablation 3 months ago. She has improved. Her optivol was up and now has gone back to her baseline.       Allergies    Allergies  Allergen Reactions  . Amiodarone Rash  . Codeine Other (See Comments)    Euphoria, hallucinations  . Other Palpitations    Other reaction(s): Other (See Comments) PT STATES SHE GETS REALLY HOT , DIZZY HEADED, HAS PASSED OUT TWICE , AND ITCHING WITH IV DYE Tumeric  . Iodine Rash    topical     Current Outpatient Medications  Medication Sig Dispense Refill  . acetaminophen (TYLENOL) 500 MG tablet Take 1,000 mg by mouth every 6 (six) hours as needed for mild pain.    . Biotin 1000 MCG tablet Take 1,000 mcg by mouth daily.     . Calcium Carb-Cholecalciferol (CALCIUM 600-D PO) Take 1 tablet by mouth daily.     . furosemide (LASIX) 40 MG tablet Take 1 tablet (40 mg total) by mouth 2 (two) times daily. (Patient taking differently: Take 40 mg by mouth 3 (three) times daily. ) 180 tablet 3  . Glucosamine-Chondroitin (OSTEO BI-FLEX REGULAR STRENGTH PO) Take 1 tablet by mouth daily.    . Insulin Glargine 300 UNIT/ML SOPN Inject 24 Units into the skin at bedtime.     . insulin lispro (HUMALOG) 100 UNIT/ML KiwkPen Inject 6 Units into the skin. Take before dinner    . levothyroxine (SYNTHROID, LEVOTHROID) 75 MCG tablet Take 1 tablet (75 mcg total) by mouth daily before breakfast. 30 tablet 2  . liraglutide (VICTOZA) 18 MG/3ML SOPN Inject 0.2 mLs (1.2 mg total) into the skin daily. For the first week, inject 0.1 mLs (0.6 mg total) daily. 1 pen 2  . metoprolol succinate (TOPROL-XL) 50 MG 24 hr tablet Take 1 tablet (50 mg total) by mouth daily. Take with or  immediately following a meal. 90 tablet 3  . Omega-3 Fatty Acids (FISH OIL PO) Take 1,000 mg by mouth daily.     Marland Kitchen omeprazole (PRILOSEC) 40 MG capsule Take 40 mg by mouth daily.     . potassium chloride SA (K-DUR,KLOR-CON) 20 MEQ tablet Take 20 mEq by mouth every other day.     . simvastatin (ZOCOR) 20 MG tablet TAKE 1 TABLET BY MOUTH ONCE DAILY AT 6 PM. 28 tablet 11  . warfarin (COUMADIN) 2 MG tablet Take 1 tablet on Monday, Wednesday and Friday. Take 2 tablets(4 mg) on  all other days.  As directed by physician to maintain INR level. (Patient taking differently: Take 2-4 mg by mouth See admin instructions. 2mg  on Monday, Tuesday, Wednesday, Friday and 4 mg on Sunday, Thursday, Saturday.  As directed by physician to maintain INR level.) 135 tablet 0   No current facility-administered medications for this visit.      Past Medical History:  Diagnosis Date  . Chronic kidney disease    CKD4  . Chronic systolic dysfunction of left ventricle   . Complete heart block ([place])   . DM (diabetes mellitus) ([place])   . HTN (hypertension)   . Nonischemic cardiomyopathy ([place])   . Persistent atrial fibrillation (HCC)     ROS:  All systems reviewed and negative except as noted in the HPI.   Past Surgical History:  Procedure Laterality Date  . ABLATION    . AV NODE ABLATION N/A 08/08/2017   Procedure: AV NODE ABLATION;  Surgeon: Thompson Grayer, MD;  Location: [facility];  Service: Cardiovascular;  Laterality: N/A;  . BREAST BIOPSY    . CARDIAC DEFIBRILLATOR PLACEMENT  2005   MDT Viva XT CRT-D BiV ICD implanted by Dr Rosaland Lao in [place]    . THYROIDECTOMY    . TOTAL VAGINAL HYSTERECTOMY       Family History  Problem Relation Age of Onset  . Hip fracture Mother   . COPD Father   . CAD Sister   . Heart attack Sister      Social History   Socioeconomic History  . Marital status: Married    Spouse name: Not on file  .  Number of children: Not on file  . Years of education: Not on file  . Highest education level: Not on file  Occupational History  . Not on file  Social Needs  . Financial resource strain: Not on file  . Food insecurity:    Worry: Not on file    Inability: Not on file  . Transportation needs:    Medical: Not on file    Non-medical: Not on file  Tobacco Use  . Smoking status: Never Smoker  . Smokeless tobacco: Never Used  Substance and Sexual Activity  . Alcohol use: No  . Drug use: No  . Sexual activity: Not on file  Lifestyle  . Physical activity:    Days per week: Not on file    Minutes per session: Not on file  . Stress: Not on file  Relationships  . Social connections:    Talks on phone: Not on file    Gets together: Not on file    Attends religious service: Not on file    Active member of club or organization: Not on file    Attends meetings of clubs or organizations: Not on file    Relationship status: Not on file  . Intimate partner violence:    Fear of current or ex partner: Not on file    Emotionally abused: Not on file    Physically abused: Not on file    Forced sexual activity: Not on file  Other Topics Concern  . Not on file  Social History Narrative  . Not on file     BP 116/78 (BP Location: Right Arm)   Pulse 89   Ht 5\' 4"  (1.626 m)   Wt 128 lb (58.1 kg)   SpO2 98%   BMI 21.97 kg/m   Physical Exam:  Well appearing NAD HEENT: Unremarkable Neck:  No JVD, no thyromegally Lymphatics:  No adenopathy Back:  No CVA tenderness Lungs:  Clear with no wheezes HEART:  Regular rate rhythm, no murmurs, no rubs, no clicks Abd:  soft, positive bowel sounds, no organomegally, no rebound, no guarding Ext:  2 plus pulses, no edema, no cyanosis, no clubbing Skin:  No rashes no nodules Neuro:  CN II through XII intact, motor grossly intact   DEVICE  Normal device function.  See PaceArt for details.   Assess/Plan: 1. Atrial fib - her ventricular rates  are well controlled. She will continue her current meds, s/p AV node ablation. 2. Chronic systolic heart failure - her symptoms remain class 2.  She will continue her current meds. 3. ICD - her medtronic Bi device is working normally. She has about 2.5 years on the battery. 4. VT - she has had no sustained VT. She will continue her current meds.  Mikle Bosworth.D.

## 2017-11-08 ENCOUNTER — Other Ambulatory Visit: Payer: Self-pay | Admitting: Family Medicine

## 2017-11-08 DIAGNOSIS — Z1231 Encounter for screening mammogram for malignant neoplasm of breast: Secondary | ICD-10-CM

## 2017-11-10 ENCOUNTER — Encounter: Payer: PPO | Admitting: Internal Medicine

## 2017-11-10 ENCOUNTER — Other Ambulatory Visit: Payer: Self-pay | Admitting: Family Medicine

## 2017-11-10 ENCOUNTER — Telehealth: Payer: Self-pay | Admitting: *Deleted

## 2017-11-10 MED ORDER — ONDANSETRON HCL 4 MG PO TABS: 4.0000 mg | ORAL_TABLET | Freq: Three times a day (TID) | ORAL | 0 refills | Status: DC | PRN

## 2017-11-10 NOTE — Telephone Encounter (Signed)
Pt calls because since changing to the victoza her HA are worse, she is nauseous and stomach cramps.   She states that she had these with the Toujeo but they are much worse now.  She declines appt or UC this weekend.  She just wants to know what Dr. Shan Levans thinks.  If she wants her to continue the Victoza she request a nausea medication.  Will forward to MD, advised to call after hours line if needed this weekend. Fleeger, Salome Spotted, CMA

## 2017-11-10 NOTE — Progress Notes (Signed)
Patient is experiencing nausea and vomiting and declines appointment. Given short course of zofran to take as needed.

## 2017-11-10 NOTE — Telephone Encounter (Signed)
LMOVM informing patient. Meggie Laseter, Salome Spotted, CMA

## 2017-11-13 NOTE — Telephone Encounter (Signed)
Patient states that she does not want to continue the victoza because of the way it makes her feel.  She would like the MD to call her.  Fleeger, Salome Spotted, CMA

## 2017-11-14 ENCOUNTER — Ambulatory Visit: Payer: PPO

## 2017-11-14 ENCOUNTER — Telehealth: Payer: Self-pay

## 2017-11-14 ENCOUNTER — Other Ambulatory Visit: Payer: Self-pay | Admitting: Family Medicine

## 2017-11-14 MED ORDER — INSULIN GLARGINE 300 UNIT/ML ~~LOC~~ SOPN: 24.0000 [IU] | PEN_INJECTOR | Freq: Every day | SUBCUTANEOUS | 2 refills | Status: DC

## 2017-11-14 NOTE — Telephone Encounter (Signed)
Spoke with patient regarding the Victoza side effects she's experiencing.  We will stop Victoza for now, and I will send in the [place] she took previously.  I will consult with our pharmacy to see what other affordable options would work for her, and we will try something else if appropriate at her visit with me on October 4.

## 2017-11-14 NOTE — Telephone Encounter (Signed)
Patient requesting to go back onto Toujeo instead of Victoza. States she has had a HA and stomach cramps x 2 weeks on the Victoza.  Call back is [phone].  [facility] in [place].  Danley Danker, RN [facility] RN)

## 2017-11-15 ENCOUNTER — Other Ambulatory Visit: Payer: Self-pay | Admitting: Family Medicine

## 2017-11-23 ENCOUNTER — Ambulatory Visit (HOSPITAL_COMMUNITY)
Admission: RE | Admit: 2017-11-23 | Discharge: 2017-11-23 | Disposition: A | Discharge status: Home / Self Care | Payer: PPO | Source: Ambulatory Visit | Attending: Family Medicine | Admitting: Family Medicine

## 2017-11-23 DIAGNOSIS — Z1231 Encounter for screening mammogram for malignant neoplasm of breast: Secondary | ICD-10-CM | POA: Insufficient documentation

## 2017-12-01 ENCOUNTER — Encounter: Payer: Self-pay | Admitting: Family Medicine

## 2017-12-01 ENCOUNTER — Ambulatory Visit (INDEPENDENT_AMBULATORY_CARE_PROVIDER_SITE_OTHER): Payer: PPO | Admitting: Family Medicine

## 2017-12-01 ENCOUNTER — Other Ambulatory Visit: Payer: Self-pay

## 2017-12-01 VITALS — BP 120/58 | HR 79 | Temp 98.0°F | Ht 64.0 in | Wt 128.0 lb

## 2017-12-01 DIAGNOSIS — Z794 Long term (current) use of insulin: Secondary | ICD-10-CM | POA: Diagnosis not present

## 2017-12-01 DIAGNOSIS — N184 Chronic kidney disease, stage 4 (severe): Secondary | ICD-10-CM | POA: Diagnosis not present

## 2017-12-01 DIAGNOSIS — E039 Hypothyroidism, unspecified: Secondary | ICD-10-CM

## 2017-12-01 DIAGNOSIS — Z23 Encounter for immunization: Secondary | ICD-10-CM | POA: Diagnosis not present

## 2017-12-01 DIAGNOSIS — E1122 Type 2 diabetes mellitus with diabetic chronic kidney disease: Secondary | ICD-10-CM

## 2017-12-01 LAB — POCT GLYCOSYLATED HEMOGLOBIN (HGB A1C): HBA1C, POC (CONTROLLED DIABETIC RANGE): 7.4 % — AB (ref 0.0–7.0)

## 2017-12-01 LAB — POCT UA - MICROALBUMIN
Creatinine, POC: 50 mg/dL
Microalbumin Ur, POC: 10 mg/L

## 2017-12-01 NOTE — Patient Instructions (Addendum)
It was nice seeing you today Ms. Ige!  I think a long acting insulin is the best medicine for your diabetes, but you can talk with your pharmacy to see if other insulin brands (Basaglar, Lantus, Levemir 70/30, Tyler Aas) would be cheaper on your insurance.  We will measure your TSH today, and I will call you if this is abnormal.  Your A1c was great today - down to 7.4 from 8.3!  I would like to see you back in three months.  If you have any questions or concerns, please feel free to call the clinic.   Be well,  Dr. Shan Levans

## 2017-12-01 NOTE — Progress Notes (Signed)
Subjective:    Emily [name] - 67 y.o. female MRN [id number]  Date of birth: 1950-03-05  HPI  Emily [name] is here for follow up of Type 2 Diabetes Mellitus and Hypothyroidism.  Type 2 Diabetes - experienced severe headaches and nausea from the trial of Victoza, so we reverted back to her previously used Toujeo, and this is going well - no hypoglycemic symptoms with 24 units of Toujeo daily - is not interested in slowly titrating up on Victoza due to its side effects  Hypothyroidism - has had no weight changes or changes in appetite - currently taking synthroid 75 mcg daily  Health Maintenance:  - has already received flu shot and declines PNA shot; mammograms are too difficult and painful for her d/t her small amount of breast tissue on the left side and the ICD on that side, so she has decided to discontinue mammograms.  She is aware of the risk of this and does frequent self breast exams. Health Maintenance Due  Topic Date Due  . Hepatitis C Screening  1950/11/24  . FOOT EXAM  09/07/1958  . OPHTHALMOLOGY EXAM  09/07/1958  . TETANUS/TDAP  09/07/1967  . MAMMOGRAM  09/07/1998  . COLONOSCOPY  09/07/1998  . DEXA SCAN  09/06/2013  . PNA vac Low Risk Adult (1 of 2 - PCV13) 09/06/2013    -  reports that she has never smoked. She has never used smokeless tobacco. - Review of Systems: Per HPI. - Past Medical History: Patient Active Problem List   Diagnosis Date Noted  . On amiodarone therapy 08/21/2017  . Atrial fibrillation with RVR ([place])   . Atrial fibrillation ([place]) 08/06/2017  . ICD (implantable cardioverter-defibrillator) in place 12/29/2016  . Anemia 12/29/2016  . IDDM (insulin dependent diabetes mellitus) ([place]) 12/29/2016  . 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 Hypercholesteremia 12/29/2016  . Hypertension, well controlled 12/29/2016  . 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 Coronary artery disease involving  native coronary artery of native heart without angina pectoris 08/28/2016  . CKD (chronic kidney disease) stage 4, GFR 15-29 ml/min (HCC) 08/26/2016  . Localized macular rash 08/11/2016  . Hypothyroidism 04/11/2016  . GERD (gastroesophageal reflux disease) 04/11/2016  . Gastroenteritis 04/10/2016  . Hypomagnesemia 04/10/2016  . Long-term insulin use in type 2 diabetes ([place]) 04/10/2016  . Hyponatremia 04/10/2016  . AKI (acute kidney injury) ([place]) 04/09/2016  . Persistent atrial fibrillation 04/05/2016  . 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 Warfarin anticoagulation 04/21/2014  . ICD (implantable cardioverter-defibrillator) battery depletion 04/21/2014  . Colitis 09/11/2012  . Acute on chronic systolic congestive heart failure ([place]) 05/02/2012  . Acute renal failure ([place]) 04/15/2012  . Dilated cardiomyopathy ([name]) 05/24/2011  . HTN (hypertension), benign 05/24/2011  . Hypercholesterolemia 05/24/2011  . ICD (implantable cardioverter-defibrillator), biventricular, in situ 05/24/2011   - Medications: reviewed and updated   Objective:   Physical Exam BP (!) 120/58   Pulse 79   Temp 98 F (36.7 C) (Oral)   Ht 5\' 4"  (1.626 m)   Wt 128 lb (58.1 kg)   SpO2 99%   BMI 21.97 kg/m  Gen: NAD, alert, cooperative with exam, well-appearing, in good spirits CV: RRR, good S1/S2, no murmur, no edema  Resp: CTABL, no wheezes, non-labored Abd: SNTND, BS present, no guarding or organomegaly Skin: no rashes, normal turgor  Neuro: no gross deficits.  Psych: good insight, alert and oriented        Assessment &  Plan:   Long-term insulin use in type 2 diabetes ([place]) A1c has improved from 8.3 to 7.4.  Will continue with Toujeo since this is working for her.  Since affordability of her medications is sometimes an issue and she has received samples of Toujeo from a previous doctor's office in the past due to its cost, I encouraged her to ask the pharmacy to compare the pricing of other long  acting insulins.  We can perhaps switch to a comparable insulin if it is more affordable.  Will obtain urine microalbumin today.  Hypothyroidism Will obtain TSH today given its wide fluctuations in the past.    Maia Breslow, M.D. 12/03/2017, 1:36 PM PGY-2, [place]

## 2017-12-02 LAB — TSH: TSH: 0.154 u[IU]/mL — AB (ref 0.450–4.500)

## 2017-12-03 NOTE — Assessment & Plan Note (Signed)
A1c has improved from 8.3 to 7.4.  Will continue with Toujeo since this is working for her.  Since affordability of her medications is sometimes an issue and she has received samples of Toujeo from a previous doctor's office in the past due to its cost, I encouraged her to ask the pharmacy to compare the pricing of other long acting insulins.  We can perhaps switch to a comparable insulin if it is more affordable.  Will obtain urine microalbumin today.

## 2017-12-03 NOTE — Assessment & Plan Note (Signed)
Will obtain TSH today given its wide fluctuations in the past.

## 2017-12-04 ENCOUNTER — Other Ambulatory Visit: Payer: Self-pay | Admitting: Family Medicine

## 2017-12-04 ENCOUNTER — Ambulatory Visit (INDEPENDENT_AMBULATORY_CARE_PROVIDER_SITE_OTHER): Payer: PPO

## 2017-12-04 DIAGNOSIS — I5022 Chronic systolic (congestive) heart failure: Secondary | ICD-10-CM | POA: Diagnosis not present

## 2017-12-04 DIAGNOSIS — Z95810 Presence of automatic (implantable) cardiac defibrillator: Secondary | ICD-10-CM | POA: Diagnosis not present

## 2017-12-04 NOTE — Progress Notes (Signed)
EPIC Encounter for ICM Monitoring  Patient Name: Emily [name] is a 67 y.o. female Date: 12/04/2017 Primary Care Physican: Kathrene Alu, MD Primary Cardiologist:Koneswaran Electrophysiologist:Taylor Dry Weight:Previous weight 129lbs (baseline 129 lbs) Bi-V Pacing:98.9%   Since 02-Nov-2017  Time in AT/AF 24.0 hr/day (100.0%)  Longest AT/AF 65 days     Spoke with husband. Heart Failure questions reviewed, pt asymptomatic.   Thoracic impedance normal.  Prescribed: Furosemide40 mgtwice daily.Dr Vickey Huger her(04/10/17 office visit)to resume 80 mg twice daily should she gain 3 pounds in 24 hours or have increasing symptoms of chest pain and/or leg swelling.   Labs: 08/09/2017 Creatinine3.00, [name], Potassium4.3, Sodium130, [id number]  08/08/2017 Creatinine3.36, BUN54, Potassium4.2, Sodium131, [id number]  08/07/2017 Creatinine3.45, BUN51, Potassium4.3, Sodium130, [id number]  08/06/2017 Creatinine3.29, BUN47, Potassium4.4, [id number], [id number] Creatinine range 2.04 to 3.18 in 2018 per Care Everywhere  Recommendations: No changes.   Encouraged to call for fluid symptoms.  Follow-up plan: ICM clinic phone appointment on 01/04/2018.      Copy of ICM check sent to Dr. Lovena Le.   3 month ICM trend: 12/04/2017   AT/AF   1 Year ICM trend:       [name], RN 12/04/2017 2:18 PM

## 2017-12-07 ENCOUNTER — Ambulatory Visit (INDEPENDENT_AMBULATORY_CARE_PROVIDER_SITE_OTHER): Payer: PPO | Admitting: *Deleted

## 2017-12-07 ENCOUNTER — Telehealth: Payer: Self-pay | Admitting: Family Medicine

## 2017-12-07 ENCOUNTER — Other Ambulatory Visit: Payer: Self-pay | Admitting: Family Medicine

## 2017-12-07 DIAGNOSIS — I4891 Unspecified atrial fibrillation: Secondary | ICD-10-CM | POA: Diagnosis not present

## 2017-12-07 DIAGNOSIS — Z5181 Encounter for therapeutic drug level monitoring: Secondary | ICD-10-CM

## 2017-12-07 DIAGNOSIS — I482 Chronic atrial fibrillation, unspecified: Secondary | ICD-10-CM | POA: Diagnosis not present

## 2017-12-07 DIAGNOSIS — I513 Intracardiac thrombosis, not elsewhere classified: Secondary | ICD-10-CM | POA: Diagnosis not present

## 2017-12-07 LAB — POCT INR: INR: 2 (ref 2.0–3.0)

## 2017-12-07 MED ORDER — LEVOTHYROXINE SODIUM 50 MCG PO TABS: 50.0000 ug | ORAL_TABLET | Freq: Every day | ORAL | 11 refills | Status: DC

## 2017-12-07 NOTE — Patient Instructions (Signed)
Continue coumadin 2 tablets daily except 1 tablet on Sundays and Wednesdays Recheck in 5 weeks

## 2017-12-07 NOTE — Telephone Encounter (Signed)
Called patient to let her know that her TSH level was too low, so we need to reduce the amount of synthroid she's taking.  We will try to have her take 75 mcg alternating with 50 mcg every other day.  She is agreeable to this.  I will call her pharmacy to make sure they will fill two different doses of synthroid, since she would rather avoid cutting pills.

## 2017-12-11 LAB — CUP PACEART INCLINIC DEVICE CHECK
Battery Voltage: 2.95 V
Brady Statistic AP VP Percent: 0.04 %
Brady Statistic AS VP Percent: 98.73 %
Brady Statistic RA Percent Paced: 0.03 %
Brady Statistic RV Percent Paced: 98.87 %
HighPow Impedance: 66 Ohm
Implantable Lead Implant Date: 20050218
Implantable Lead Location: 753858
Implantable Lead Location: 753859
Implantable Lead Location: 753860
Implantable Lead Model: 4194
Implantable Lead Model: 5076
Implantable Lead Model: 6947
Implantable Pulse Generator Implant Date: 20160413
Lead Channel Impedance Value: 399 Ohm
Lead Channel Impedance Value: 513 Ohm
Lead Channel Impedance Value: 589 Ohm
Lead Channel Impedance Value: 893 Ohm
Lead Channel Pacing Threshold Amplitude: 1 V
Lead Channel Pacing Threshold Pulse Width: 0.4 ms
Lead Channel Sensing Intrinsic Amplitude: 0.5 mV
Lead Channel Setting Pacing Amplitude: 2.25 V
Lead Channel Setting Pacing Amplitude: 2.75 V
Lead Channel Setting Pacing Pulse Width: 0.4 ms
MDC IDC LEAD IMPLANT DT: 20050218
MDC IDC LEAD IMPLANT DT: 20050218
MDC IDC MSMT BATTERY REMAINING LONGEVITY: 36 mo
MDC IDC MSMT LEADCHNL LV IMPEDANCE VALUE: 456 Ohm
MDC IDC MSMT LEADCHNL LV PACING THRESHOLD AMPLITUDE: 1 V
MDC IDC MSMT LEADCHNL LV PACING THRESHOLD PULSEWIDTH: 0.4 ms
MDC IDC MSMT LEADCHNL RA SENSING INTR AMPL: 0.5 mV
MDC IDC MSMT LEADCHNL RV IMPEDANCE VALUE: 342 Ohm
MDC IDC MSMT LEADCHNL RV SENSING INTR AMPL: 4.3 mV
MDC IDC MSMT LEADCHNL RV SENSING INTR AMPL: 8.5 mV
MDC IDC SESS DTM: 20191007083624
MDC IDC SET LEADCHNL RA PACING AMPLITUDE: 2 V
MDC IDC SET LEADCHNL RV PACING PULSEWIDTH: 0.4 ms
MDC IDC SET LEADCHNL RV SENSING SENSITIVITY: 0.3 mV
MDC IDC STAT BRADY AP VS PERCENT: 0 %
MDC IDC STAT BRADY AS VS PERCENT: 1.23 %

## 2017-12-14 ENCOUNTER — Telehealth: Payer: Self-pay

## 2017-12-14 NOTE — Telephone Encounter (Signed)
With patient regarding her concerns that the combination of 50 mcg and 75 mcg alternating doses of Synthroid is too low for her.  Is that she has gained 2 to 3 pounds since making this change, and she was told that the top part of her heart is having atrial fibrillation, although her ventricles are not due to her ICD.  These are both symptoms that she had when her thyroid level was too low earlier this year.  She says that she has been taking the medication at least 1 hour prior to eating breakfast every morning.  We decided to try taking 1 day of 50 mcg to every 2 days of 75 mcg to see if this dose will work for her.  I told her that 75 mcg daily is too high and that I could not allow her to continue with this dose.  Also advised her that she needs to get her TSH and possibly free T3 and T4 checked within the month if possible.  She was agreeable to this plan and does not think this new regimen will be too complicated to remember.

## 2017-12-14 NOTE — Telephone Encounter (Signed)
Patient called asking for PCP to call her. She is concerned because since starting to alternate her levothyroxine 75 mcg with 50 mcg she has gained 2-3 pounds.  Stated she was put on 50 mcg by previous PCP and gained 12 pounds quickly and it "messed up" her a fib and put her in the hospital which is where she met current PCP.  She does not want to take the 50 mcg levothyroxine.  Call back is [phone]  Danley Danker, RN [facility] RN)

## 2018-01-04 ENCOUNTER — Ambulatory Visit (INDEPENDENT_AMBULATORY_CARE_PROVIDER_SITE_OTHER): Payer: PPO

## 2018-01-04 DIAGNOSIS — I5022 Chronic systolic (congestive) heart failure: Secondary | ICD-10-CM | POA: Diagnosis not present

## 2018-01-04 DIAGNOSIS — Z95810 Presence of automatic (implantable) cardiac defibrillator: Secondary | ICD-10-CM | POA: Diagnosis not present

## 2018-01-05 ENCOUNTER — Telehealth: Payer: Self-pay

## 2018-01-05 NOTE — Progress Notes (Signed)
EPIC Encounter for ICM Monitoring  Patient Name: Emily [name] is a 67 y.o. female Date: 01/05/2018 Primary Care Physican: Kathrene Alu, MD Primary Cardiologist:Koneswaran Electrophysiologist:Taylor Last Weight:129lbs (baseline 129 lbs) Todays Weight:  Bi-V Pacing:99.1%       Attempted call to patient and unable to reach.  Left detailed message, per DPR, regarding transmission.  Transmission reviewed.    Thoracic impedance normal.   Prescribed: Furosemide40 mgtwice daily.Dr Vickey Huger her(04/10/17 office visit)to resume 80 mg twice daily should she gain 3 pounds in 24 hours or have increasing symptoms of chest pain and/or leg swelling.   Labs: 08/09/2017 Creatinine3.00, [name], Potassium4.3, Sodium130, [id number]  08/08/2017 Creatinine3.36, BUN54, Potassium4.2, Sodium131, [id number]  08/07/2017 Creatinine3.45, BUN51, Potassium4.3, Sodium130, [id number]  08/06/2017 Creatinine3.29, BUN47, Potassium4.4, [id number], [id number] Creatinine range 2.04 to 3.18 in 2018 per Care Everywhere  Recommendations: Left voice mail with ICM number and encouraged to call if experiencing any fluid symptoms.  Follow-up plan: ICM clinic phone appointment on 02/05/2018.    Copy of ICM check sent to Dr. Lovena Le.   3 month ICM trend: 01/04/2018    1 Year ICM trend:       [name], RN 01/05/2018 9:13 AM

## 2018-01-05 NOTE — Telephone Encounter (Signed)
Remote ICM transmission received.  Attempted call to patient regarding ICM remote transmission and left detailed message, per DPR, with next ICM remote transmission date of 02/05/2018.  Advised to return call for any fluid symptoms or questions.

## 2018-01-11 ENCOUNTER — Ambulatory Visit (INDEPENDENT_AMBULATORY_CARE_PROVIDER_SITE_OTHER): Payer: PPO | Admitting: *Deleted

## 2018-01-11 DIAGNOSIS — I513 Intracardiac thrombosis, not elsewhere classified: Secondary | ICD-10-CM | POA: Diagnosis not present

## 2018-01-11 DIAGNOSIS — Z5181 Encounter for therapeutic drug level monitoring: Secondary | ICD-10-CM | POA: Diagnosis not present

## 2018-01-11 DIAGNOSIS — I4891 Unspecified atrial fibrillation: Secondary | ICD-10-CM | POA: Diagnosis not present

## 2018-01-11 DIAGNOSIS — I482 Chronic atrial fibrillation, unspecified: Secondary | ICD-10-CM

## 2018-01-11 LAB — POCT INR: INR: 3 (ref 2.0–3.0)

## 2018-01-11 NOTE — Patient Instructions (Signed)
Continue coumadin 2 tablets daily except 1 tablet on Sundays and Wednesdays Recheck in 6 weeks

## 2018-01-29 ENCOUNTER — Telehealth: Payer: Self-pay

## 2018-01-29 NOTE — Telephone Encounter (Signed)
Attempted return call as requested by voice mail message by patient saying she wants to have a Carelink transmission reviewed due to her sister died this past weekend.  She said she is not having any symptoms but just would like to have one reviewed.  Left voice mail message stating to send a manual transmission from her monitor at home and will call her back after it is reviewed.

## 2018-01-29 NOTE — Telephone Encounter (Signed)
Reviewed transmission.  Call back to patient and advised the transmission does not show any new changes.  She said she is not feeling bad but just under stress for past 2 weeks.  She said it is a friends sister that died.  She denies any symptoms at this time.  Advised if she has changes to call the office or ER if needed.

## 2018-02-05 ENCOUNTER — Ambulatory Visit (INDEPENDENT_AMBULATORY_CARE_PROVIDER_SITE_OTHER): Payer: PPO

## 2018-02-05 ENCOUNTER — Telehealth: Payer: Self-pay | Admitting: *Deleted

## 2018-02-05 DIAGNOSIS — I5022 Chronic systolic (congestive) heart failure: Secondary | ICD-10-CM

## 2018-02-05 DIAGNOSIS — I420 Dilated cardiomyopathy: Secondary | ICD-10-CM

## 2018-02-05 DIAGNOSIS — Z95810 Presence of automatic (implantable) cardiac defibrillator: Secondary | ICD-10-CM | POA: Diagnosis not present

## 2018-02-05 LAB — CUP PACEART REMOTE DEVICE CHECK
Battery Remaining Longevity: 32 mo
Battery Voltage: 2.95 V
Brady Statistic AP VP Percent: 0.04 %
Brady Statistic AP VS Percent: 0 %
Brady Statistic AS VP Percent: 98.71 %
Brady Statistic RA Percent Paced: 0.03 %
Date Time Interrogation Session: 20191209083324
HIGH POWER IMPEDANCE MEASURED VALUE: 70 Ohm
Implantable Lead Implant Date: 20050218
Implantable Lead Implant Date: 20050218
Implantable Lead Location: 753860
Implantable Lead Model: 4194
Implantable Lead Model: 6947
Implantable Pulse Generator Implant Date: 20160413
Lead Channel Impedance Value: 342 Ohm
Lead Channel Impedance Value: 418 Ohm
Lead Channel Impedance Value: 532 Ohm
Lead Channel Impedance Value: 646 Ohm
Lead Channel Impedance Value: 893 Ohm
Lead Channel Pacing Threshold Amplitude: 0.75 V
Lead Channel Pacing Threshold Amplitude: 0.875 V
Lead Channel Pacing Threshold Amplitude: 1.5 V
Lead Channel Pacing Threshold Pulse Width: 0.4 ms
Lead Channel Pacing Threshold Pulse Width: 0.4 ms
Lead Channel Pacing Threshold Pulse Width: 0.4 ms
Lead Channel Sensing Intrinsic Amplitude: 0.5 mV
Lead Channel Sensing Intrinsic Amplitude: 10.125 mV
Lead Channel Sensing Intrinsic Amplitude: 10.125 mV
Lead Channel Setting Pacing Amplitude: 2.25 V
Lead Channel Setting Pacing Pulse Width: 0.4 ms
Lead Channel Setting Pacing Pulse Width: 0.4 ms
Lead Channel Setting Sensing Sensitivity: 0.3 mV
MDC IDC LEAD IMPLANT DT: 20050218
MDC IDC LEAD LOCATION: 753858
MDC IDC LEAD LOCATION: 753859
MDC IDC MSMT LEADCHNL LV IMPEDANCE VALUE: 456 Ohm
MDC IDC MSMT LEADCHNL RA SENSING INTR AMPL: 0.5 mV
MDC IDC SET LEADCHNL RA PACING AMPLITUDE: 2 V
MDC IDC SET LEADCHNL RV PACING AMPLITUDE: 3 V
MDC IDC STAT BRADY AS VS PERCENT: 1.25 %
MDC IDC STAT BRADY RV PERCENT PACED: 98.89 %

## 2018-02-05 MED ORDER — WARFARIN SODIUM 2 MG PO TABS: ORAL_TABLET | ORAL | 3 refills | Status: DC

## 2018-02-05 NOTE — Telephone Encounter (Signed)
Please give pt a call  °

## 2018-02-05 NOTE — Telephone Encounter (Signed)
Pt needs new Rx for warfarin sent to [facility] with correct instructions and quantity because she ran out of pills early.  New Rx sent.

## 2018-02-06 NOTE — Progress Notes (Signed)
EPIC Encounter for ICM Monitoring  Patient Name: Emily [name] is a 67 y.o. female Date: 02/06/2018 Primary Care Physican: Kathrene Alu, MD Primary Cardiologist:Koneswaran Electrophysiologist:Taylor Bi-V Pacing:99.1% Last Weight:129lbs (baseline 129 lbs) Today's Weight: 128 lbs   Clinical Status (29-Jan-2018 to 05-Feb-2018)  Treated VT/VF 0 episodes  AT/AF 1 episode   Time in AT/AF 24.0 hr/day (100.0%)   Observations (1) (29-Jan-2018 to 05-Feb-2018)  AT/AF >= 6 hr for 7 days.       Heart Failure questions reviewed, pt asymptomatic.   Thoracic impedance normal.   Prescribed: Furosemide40 mgtwice daily.Dr Vickey Huger her(04/10/17 office visit)to resume 80 mg twice daily should she gain 3 pounds in 24 hours or have increasing symptoms of chest pain and/or leg swelling.   Labs: 08/09/2017 Creatinine3.00, Emily [name], Potassium4.3, Sodium130, [id number]  08/08/2017 Creatinine3.36, BUN54, Potassium4.2, Sodium131, Emily [name]  08/07/2017 Creatinine3.45, BUN51, Potassium4.3, Sodium130, Emily [name]  08/06/2017 Creatinine3.29, BUN47, Potassium4.4, Emily [name], Emily [name] Creatinine range 2.04 to 3.18 in 2018 per Care Everywhere  Recommendations: No changes.  Encouraged to call for fluid symptoms.  Follow-up plan: ICM clinic phone appointment on 03/15/2018.    Copy of ICM check sent to Dr. Lovena Le.   3 month ICM trend: 02/05/2018    1 Year ICM trend:       Emily Billings, RN 02/06/2018 5:59 PM

## 2018-02-07 NOTE — Progress Notes (Signed)
Remote ICD transmission.   

## 2018-03-07 ENCOUNTER — Other Ambulatory Visit: Payer: Self-pay | Admitting: Cardiovascular Disease

## 2018-03-08 ENCOUNTER — Ambulatory Visit (INDEPENDENT_AMBULATORY_CARE_PROVIDER_SITE_OTHER): Payer: PPO | Admitting: Pharmacist

## 2018-03-08 DIAGNOSIS — Z5181 Encounter for therapeutic drug level monitoring: Secondary | ICD-10-CM

## 2018-03-08 DIAGNOSIS — I482 Chronic atrial fibrillation, unspecified: Secondary | ICD-10-CM

## 2018-03-08 DIAGNOSIS — I513 Intracardiac thrombosis, not elsewhere classified: Secondary | ICD-10-CM

## 2018-03-08 LAB — POCT INR: INR: 3.4 — AB (ref 2.0–3.0)

## 2018-03-08 NOTE — Patient Instructions (Signed)
Description   Skip Coumadin dose today then continue coumadin 2 tablets daily except 1 tablet on Sundays and Wednesdays Recheck in 4 weeks

## 2018-03-15 ENCOUNTER — Ambulatory Visit (INDEPENDENT_AMBULATORY_CARE_PROVIDER_SITE_OTHER): Payer: PPO

## 2018-03-15 DIAGNOSIS — Z95810 Presence of automatic (implantable) cardiac defibrillator: Secondary | ICD-10-CM | POA: Diagnosis not present

## 2018-03-15 DIAGNOSIS — I5022 Chronic systolic (congestive) heart failure: Secondary | ICD-10-CM

## 2018-03-16 NOTE — Progress Notes (Signed)
EPIC Encounter for ICM Monitoring  Patient Name: Emily [name] is a 68 y.o. female Date: 03/16/2018 Primary Care Physican: Kathrene Alu, MD Primary Cardiologist:Koneswaran Electrophysiologist:Taylor Bi-V Pacing:99.1% LastWeight:128lbs (baseline 129 lbs)   Clinical Status (05-Feb-2018 to 15-Mar-2018) Treated VT/VF 0 episodes  AT/AF 1 episode  Time in AT/AF 24.0 hr/day (100.0%)  Observations (1) (05-Feb-2018 to 15-Mar-2018) AT/AF >= 6 hr for 38 days. Longest AT/AF 5 months               Attempted call to patient.  Left detailed message per DPR regarding transmission. Transmission reviewed.     Thoracic impedance normal.   Prescribed: Furosemide40 mgtwice daily.Dr Vickey Huger her(04/10/17 office visit)to resume 80 mg twice daily should she gain 3 pounds in 24 hours or have increasing symptoms of chest pain and/or leg swelling.   Labs: 08/09/2017 Creatinine3.00, [name], Potassium4.3, Sodium130, [id number]  08/08/2017 Creatinine3.36, BUN54, Potassium4.2, Sodium131, [id number]  08/07/2017 Creatinine3.45, BUN51, Potassium4.3, Sodium130, [id number]  08/06/2017 Creatinine3.29, BUN47, Potassium4.4, [id number], [id number] Creatinine range 2.04 to 3.18 in 2018 per Care Everywhere  Recommendations: Unable to reach.  Follow-up plan: ICM clinic phone appointment on 04/16/2018.    Copy of ICM check sent to Dr. Lovena Le.   AT/AF   3 month ICM trend: 03/15/2018    1 Year ICM trend:       [name], RN 03/16/2018 2:43 PM

## 2018-04-05 ENCOUNTER — Ambulatory Visit (INDEPENDENT_AMBULATORY_CARE_PROVIDER_SITE_OTHER): Payer: PPO | Admitting: Pharmacist

## 2018-04-05 DIAGNOSIS — I513 Intracardiac thrombosis, not elsewhere classified: Secondary | ICD-10-CM | POA: Diagnosis not present

## 2018-04-05 DIAGNOSIS — Z5181 Encounter for therapeutic drug level monitoring: Secondary | ICD-10-CM | POA: Diagnosis not present

## 2018-04-05 DIAGNOSIS — I482 Chronic atrial fibrillation, unspecified: Secondary | ICD-10-CM | POA: Diagnosis not present

## 2018-04-05 LAB — POCT INR: INR: 3.5 — AB (ref 2.0–3.0)

## 2018-04-05 NOTE — Patient Instructions (Signed)
Description   Skip Coumadin dose today then change dose to  coumadin 2 tablets daily except 1 tablet on Sundays,  Wednesdays, and Fridays. Recheck in 3 weeks

## 2018-04-09 ENCOUNTER — Other Ambulatory Visit: Payer: Self-pay | Admitting: Cardiovascular Disease

## 2018-04-16 ENCOUNTER — Ambulatory Visit (INDEPENDENT_AMBULATORY_CARE_PROVIDER_SITE_OTHER): Payer: PPO

## 2018-04-16 ENCOUNTER — Ambulatory Visit: Payer: PPO | Admitting: Cardiovascular Disease

## 2018-04-16 DIAGNOSIS — I5022 Chronic systolic (congestive) heart failure: Secondary | ICD-10-CM

## 2018-04-16 DIAGNOSIS — Z95810 Presence of automatic (implantable) cardiac defibrillator: Secondary | ICD-10-CM | POA: Diagnosis not present

## 2018-04-17 NOTE — Progress Notes (Signed)
EPIC Encounter for ICM Monitoring  Patient Name: Emily [name] is a 68 y.o. female Date: 04/17/2018 Primary Care Physican: Kathrene Alu, MD Primary Cardiologist:Koneswaran Electrophysiologist:Taylor Bi-V Pacing:98.6% LastWeight:128lbs (baseline 129 lbs) Today's weight: 130 lbs  Clinical Status (15-Mar-2018 to 16-Apr-2018)  Treated VT/VF 0 episodes   AT/AF 1 episode   Time in AT/AF 24.0 hr/day (100.0%)  Observations (1) (15-Mar-2018 to 16-Apr-2018)  AT/AF >= 6 hr for 32 days.  Longest AT/AF 6 months      Heart failure questions reviewed.  She said she has gained a 2-3 pounds in the last few days but otherwise she is feeling fine.        Thoracic impedance normal.   Prescribed:Furosemide80 mgtwice daily.Potassium 20 mEq 1 tablet every other day.   Labs: 08/09/2017 Creatinine3.00, [name], Potassium4.3, Sodium130, [id number]  08/08/2017 Creatinine3.36, BUN54, Potassium4.2, Sodium131, [id number]  08/07/2017 Creatinine3.45, BUN51, Potassium4.3, Sodium130, [id number]  08/06/2017 Creatinine3.29, BUN47, Potassium4.4, [id number], [id number] Creatinine range 2.04 to 3.18 in 2018 per Care Everywhere  Recommendations: Advised to call if she continues to gain weight or have other fluid symptoms.   Follow-up plan: ICM clinic phone appointment on3/23/2020. Office visit with Dr Bronson Ing 05/02/2018.  Copy of ICM check sent to Dr.Taylor.   AT/AF   3 month ICM trend: 04/16/2018    1 Year ICM trend:       [name], RN 04/17/2018 1:17 PM

## 2018-04-26 ENCOUNTER — Ambulatory Visit (INDEPENDENT_AMBULATORY_CARE_PROVIDER_SITE_OTHER): Payer: PPO | Admitting: *Deleted

## 2018-04-26 DIAGNOSIS — I513 Intracardiac thrombosis, not elsewhere classified: Secondary | ICD-10-CM

## 2018-04-26 DIAGNOSIS — I482 Chronic atrial fibrillation, unspecified: Secondary | ICD-10-CM

## 2018-04-26 DIAGNOSIS — Z5181 Encounter for therapeutic drug level monitoring: Secondary | ICD-10-CM | POA: Diagnosis not present

## 2018-04-26 DIAGNOSIS — I4891 Unspecified atrial fibrillation: Secondary | ICD-10-CM

## 2018-04-26 LAB — POCT INR: INR: 2.5 (ref 2.0–3.0)

## 2018-04-26 NOTE — Patient Instructions (Signed)
Continue coumadin 2 tablets daily except 1 tablet on Sundays,  Wednesdays, and Fridays. Recheck in 4 weeks

## 2018-04-30 ENCOUNTER — Ambulatory Visit: Payer: PPO | Admitting: Cardiovascular Disease

## 2018-05-01 DIAGNOSIS — E11319 Type 2 diabetes mellitus with unspecified diabetic retinopathy without macular edema: Secondary | ICD-10-CM | POA: Diagnosis not present

## 2018-05-01 DIAGNOSIS — E119 Type 2 diabetes mellitus without complications: Secondary | ICD-10-CM | POA: Diagnosis not present

## 2018-05-02 ENCOUNTER — Encounter: Payer: Self-pay | Admitting: Cardiovascular Disease

## 2018-05-02 ENCOUNTER — Ambulatory Visit: Payer: PPO | Admitting: Cardiovascular Disease

## 2018-05-02 VITALS — BP 118/78 | HR 81 | Ht 63.0 in | Wt 132.0 lb

## 2018-05-02 DIAGNOSIS — E785 Hyperlipidemia, unspecified: Secondary | ICD-10-CM | POA: Diagnosis not present

## 2018-05-02 DIAGNOSIS — Z95810 Presence of automatic (implantable) cardiac defibrillator: Secondary | ICD-10-CM

## 2018-05-02 DIAGNOSIS — I4821 Permanent atrial fibrillation: Secondary | ICD-10-CM | POA: Diagnosis not present

## 2018-05-02 DIAGNOSIS — Z9889 Other specified postprocedural states: Secondary | ICD-10-CM

## 2018-05-02 DIAGNOSIS — I10 Essential (primary) hypertension: Secondary | ICD-10-CM

## 2018-05-02 DIAGNOSIS — I5022 Chronic systolic (congestive) heart failure: Secondary | ICD-10-CM

## 2018-05-02 DIAGNOSIS — I420 Dilated cardiomyopathy: Secondary | ICD-10-CM | POA: Diagnosis not present

## 2018-05-02 NOTE — Patient Instructions (Signed)
Medication Instructions:  Continue all current medications.   Follow-Up: Your physician wants you to follow up in: 6 months.  You will receive a reminder letter in the mail one-two months in advance.  If you don't receive a letter, please call our office to schedule the follow up appointment   Any Other Special Instructions Will Be Listed Below (If Applicable).  If you need a refill on your cardiac medications before your next appointment, please call your pharmacy.  
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
## 2018-05-07 ENCOUNTER — Ambulatory Visit (INDEPENDENT_AMBULATORY_CARE_PROVIDER_SITE_OTHER): Payer: PPO | Admitting: *Deleted

## 2018-05-07 DIAGNOSIS — I420 Dilated cardiomyopathy: Secondary | ICD-10-CM | POA: Diagnosis not present

## 2018-05-07 DIAGNOSIS — R55 Syncope and collapse: Secondary | ICD-10-CM

## 2018-05-09 LAB — CUP PACEART REMOTE DEVICE CHECK
Battery Remaining Longevity: 27 mo
Brady Statistic AP VP Percent: 0.04 %
Brady Statistic RA Percent Paced: 0.03 %
Brady Statistic RV Percent Paced: 98.63 %
HighPow Impedance: 72 Ohm
Implantable Lead Implant Date: 20050218
Implantable Lead Implant Date: 20050218
Implantable Lead Implant Date: 20050218
Implantable Lead Location: 753858
Implantable Lead Model: 4194
Implantable Lead Model: 5076
Implantable Lead Model: 6947
Lead Channel Impedance Value: 418 Ohm
Lead Channel Impedance Value: 456 Ohm
Lead Channel Impedance Value: 551 Ohm
Lead Channel Impedance Value: 646 Ohm
Lead Channel Impedance Value: 931 Ohm
Lead Channel Pacing Threshold Amplitude: 0.875 V
Lead Channel Pacing Threshold Amplitude: 1.375 V
Lead Channel Pacing Threshold Pulse Width: 0.4 ms
Lead Channel Pacing Threshold Pulse Width: 0.4 ms
Lead Channel Sensing Intrinsic Amplitude: 0.5 mV
Lead Channel Sensing Intrinsic Amplitude: 0.5 mV
Lead Channel Sensing Intrinsic Amplitude: 6.25 mV
Lead Channel Setting Pacing Amplitude: 2 V
Lead Channel Setting Sensing Sensitivity: 0.3 mV
MDC IDC LEAD LOCATION: 753859
MDC IDC LEAD LOCATION: 753860
MDC IDC MSMT BATTERY VOLTAGE: 2.95 V
MDC IDC MSMT LEADCHNL LV PACING THRESHOLD PULSEWIDTH: 0.4 ms
MDC IDC MSMT LEADCHNL RA PACING THRESHOLD AMPLITUDE: 0.75 V
MDC IDC MSMT LEADCHNL RV IMPEDANCE VALUE: 361 Ohm
MDC IDC MSMT LEADCHNL RV SENSING INTR AMPL: 6.25 mV
MDC IDC PG IMPLANT DT: 20160413
MDC IDC SESS DTM: 20200306153323
MDC IDC SET LEADCHNL LV PACING AMPLITUDE: 2.25 V
MDC IDC SET LEADCHNL LV PACING PULSEWIDTH: 0.4 ms
MDC IDC SET LEADCHNL RV PACING AMPLITUDE: 2.75 V
MDC IDC SET LEADCHNL RV PACING PULSEWIDTH: 0.4 ms
MDC IDC STAT BRADY AP VS PERCENT: 0 %
MDC IDC STAT BRADY AS VP PERCENT: 98.43 %
MDC IDC STAT BRADY AS VS PERCENT: 1.53 %

## 2018-05-15 ENCOUNTER — Other Ambulatory Visit: Payer: Self-pay | Admitting: Family Medicine

## 2018-05-15 NOTE — Progress Notes (Signed)
Remote ICD transmission.   

## 2018-05-21 ENCOUNTER — Other Ambulatory Visit: Payer: Self-pay

## 2018-05-21 ENCOUNTER — Ambulatory Visit (INDEPENDENT_AMBULATORY_CARE_PROVIDER_SITE_OTHER): Payer: PPO

## 2018-05-21 DIAGNOSIS — Z95810 Presence of automatic (implantable) cardiac defibrillator: Secondary | ICD-10-CM | POA: Diagnosis not present

## 2018-05-21 DIAGNOSIS — I5022 Chronic systolic (congestive) heart failure: Secondary | ICD-10-CM

## 2018-05-21 IMAGING — US US RENAL
1 series · 14 of 25 positions shown · non-contrast
Comparison: Report of portable abdomen ultrasound 03/02/1998 (no
images available).

CLINICAL DATA: 66-year-old female with stage 3 chronic kidney
disease.

EXAM:
RENAL / URINARY TRACT ULTRASOUND COMPLETE

[Series 1: us renal · 0.20mm/px · 14 of 54 slices shown]
[im 1/54]
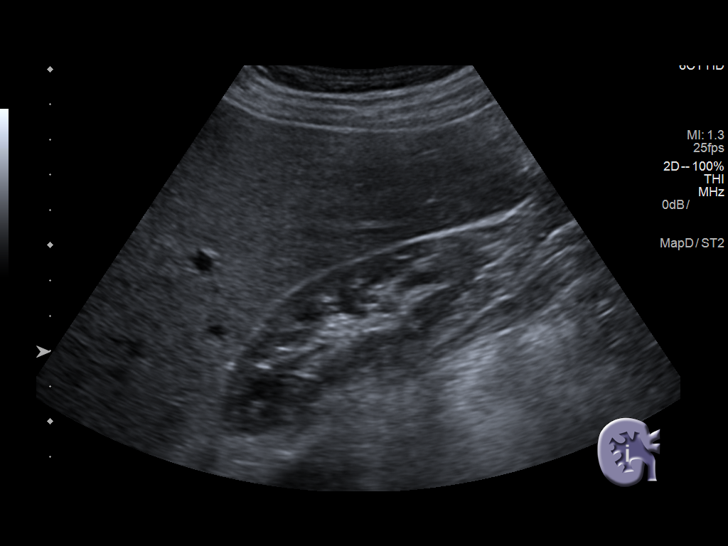
[im 5/54]
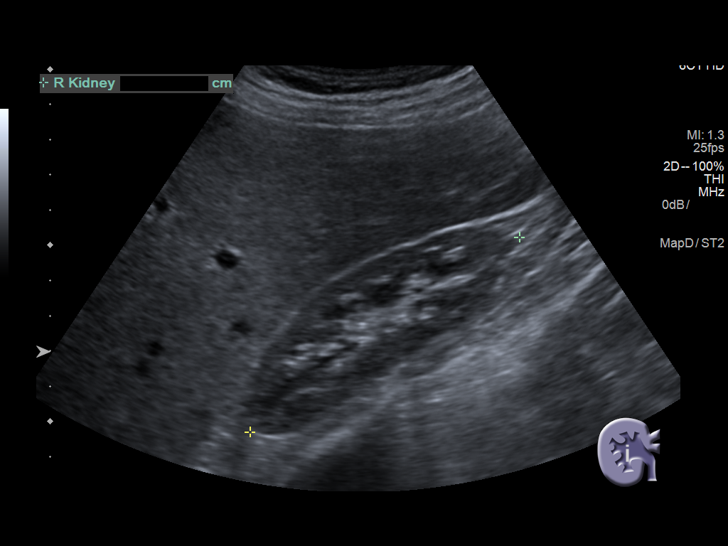
[im 9/54]
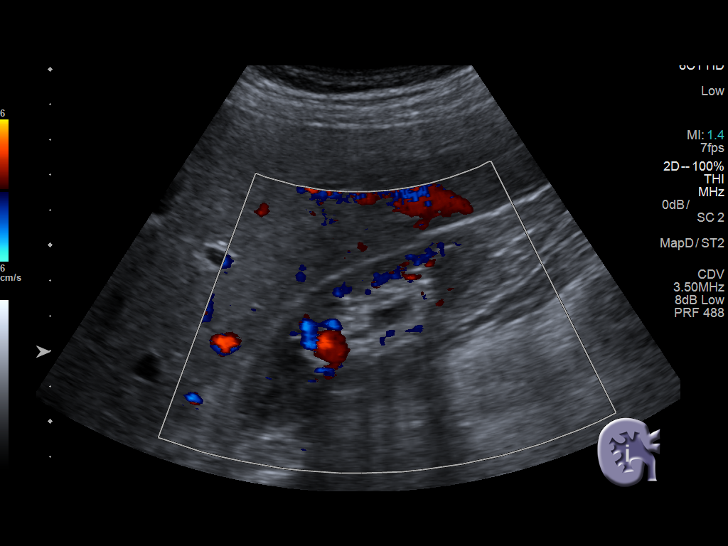
[im 14/54]
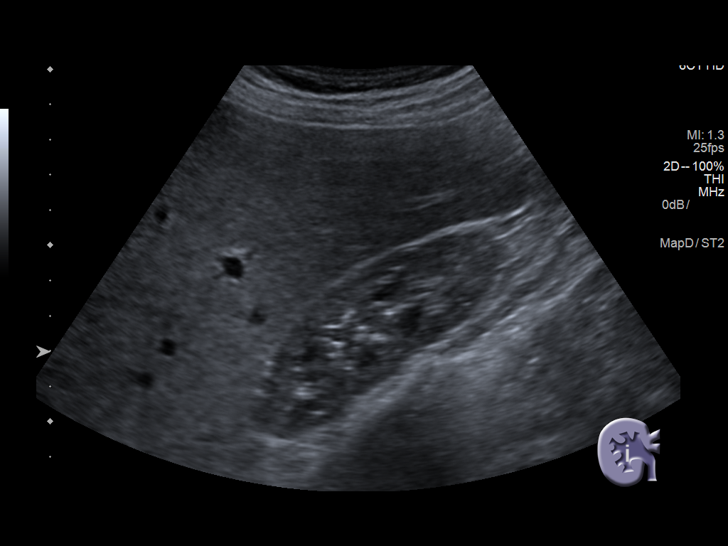
[im 18/54]
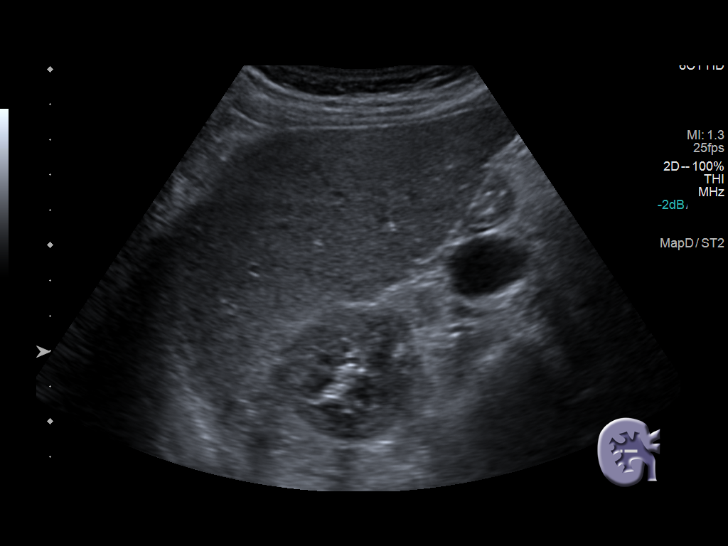
[im 20/54]
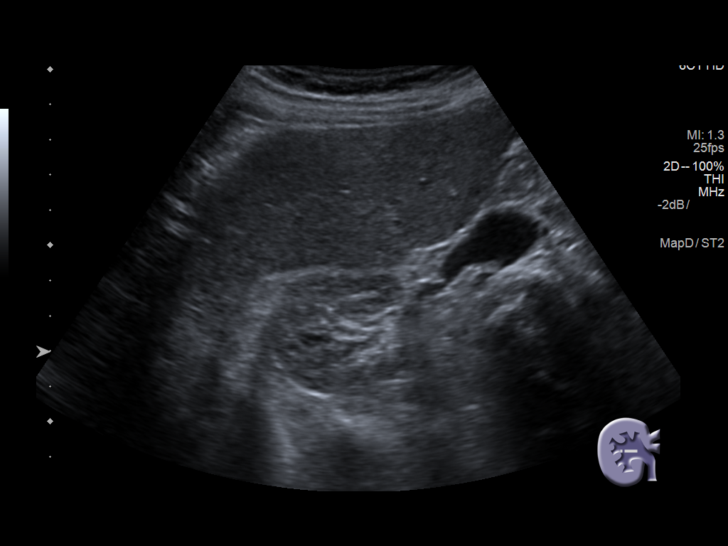
[im 25/54]
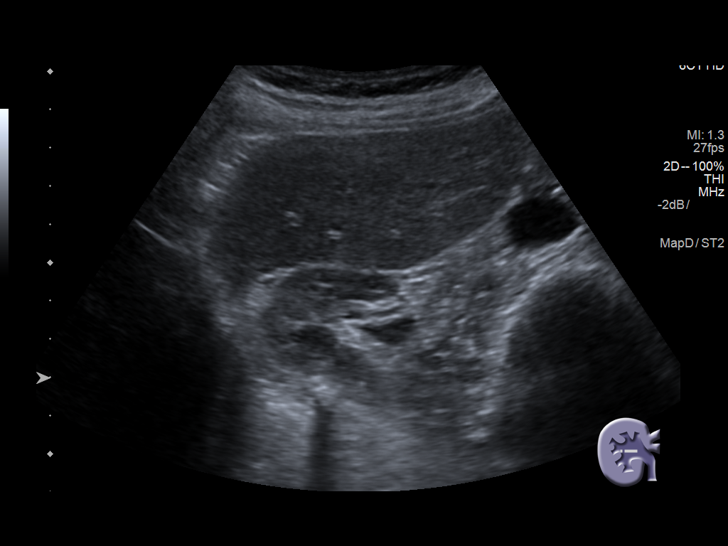
[im 29/54]
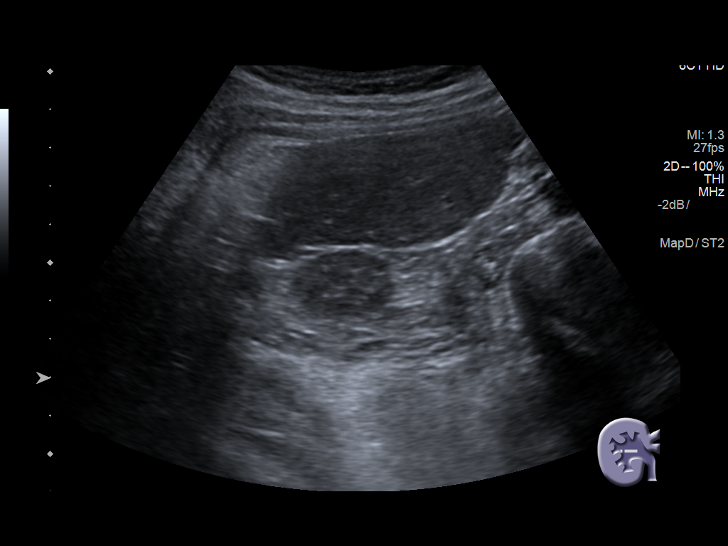
[im 34/54]
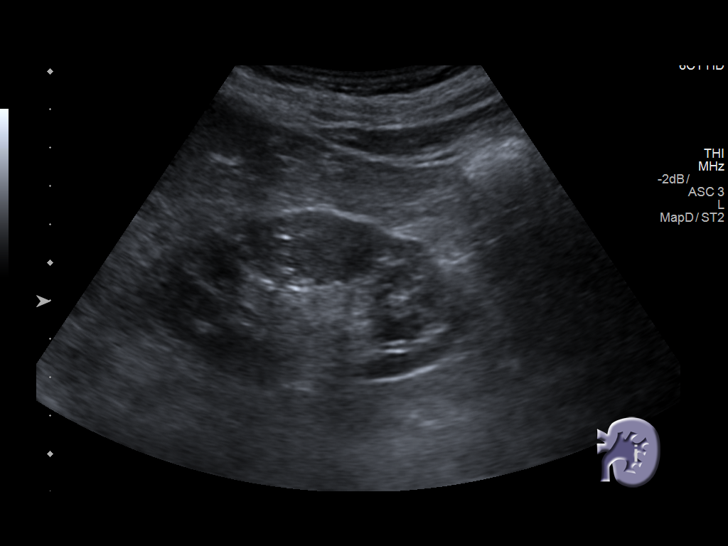
[im 36/54]
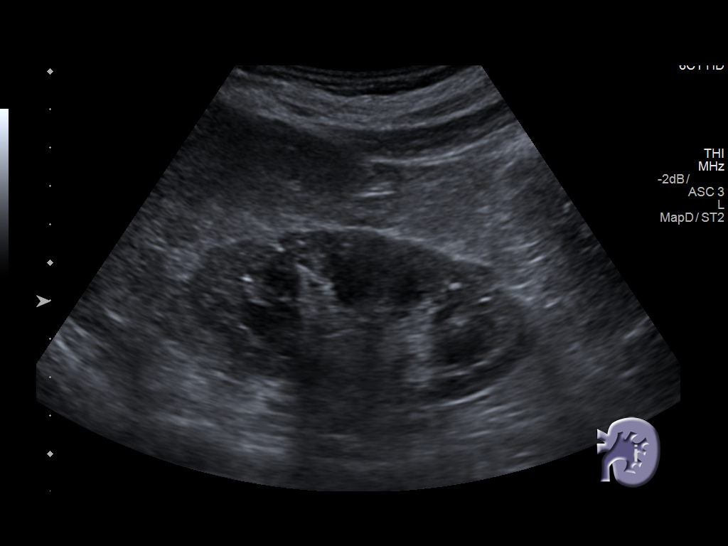
[im 40/54]
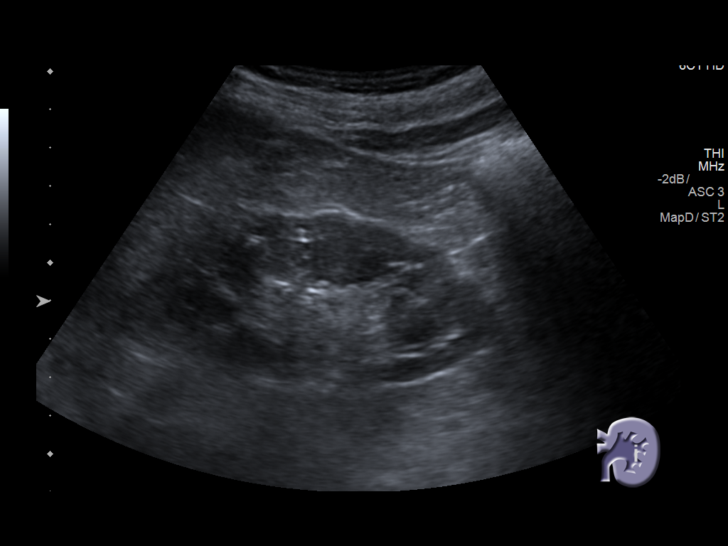
[im 45/54]
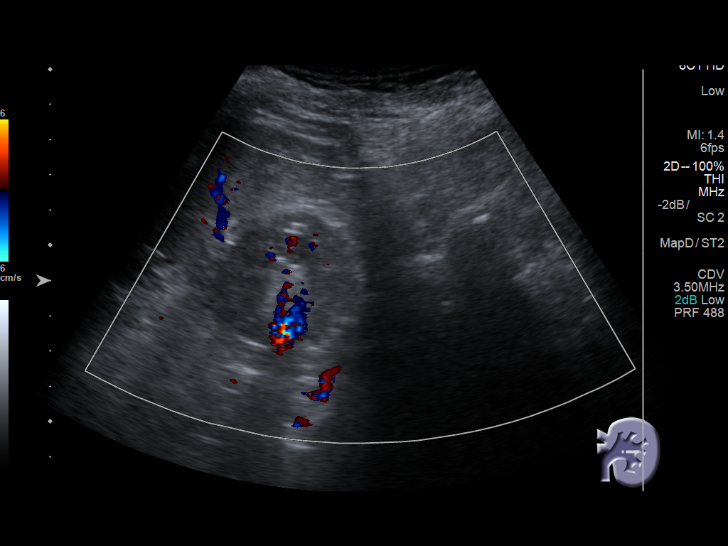
[im 49/54]
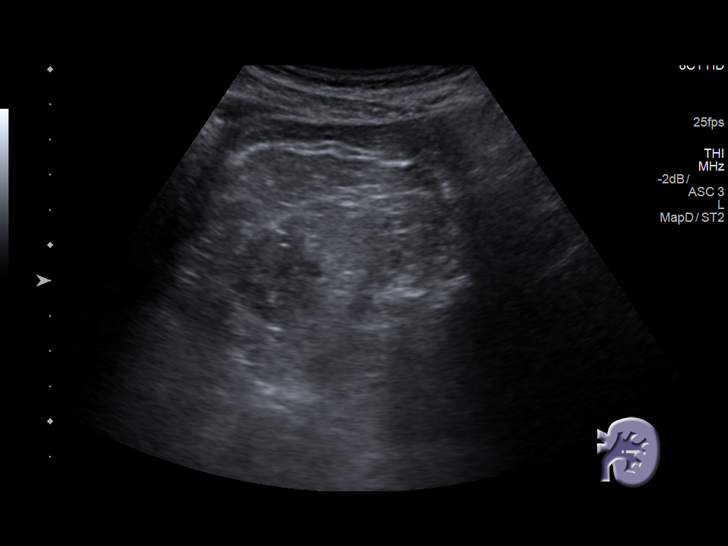
[im 54/54]
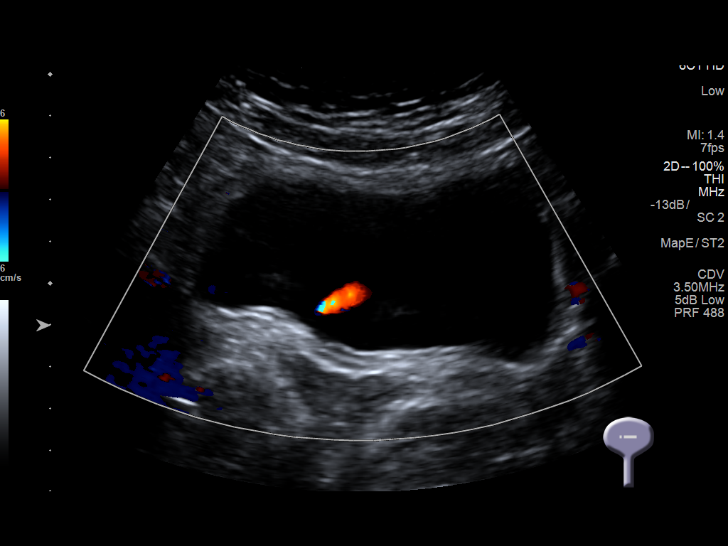

[14 of 25 positions shown; findings below may reference images not displayed]

FINDINGS: Right Kidney:

Length: 9.4 centimeters.

Cortical echogenicity is within normal limits. No right renal mass.
The collecting system appears within normal limits no
hydronephrosis.

Left Kidney:

Length: 9.4 centimeters.

Cortical echogenicity within normal limits. The left renal
collecting system appears normal. No hydronephrosis. Possible
nephrocalcinosis or nephrolithiasis in the form of punctate
echogenic foci (for example on image 34). Alternatively these could
be renal vascular calcifications.

Bladder:

Appears normal for degree of bladder distention. Both ureteral jets
are detected with Doppler.
IMPRESSION: 1. No acute renal findings.
2. Questionable left nephrolithiasis or nephrocalcinosis versus
punctate renal vascular calcifications.
3. Unremarkable urinary bladder.

## 2018-05-22 ENCOUNTER — Telehealth: Payer: Self-pay | Admitting: Cardiovascular Disease

## 2018-05-22 NOTE — Telephone Encounter (Signed)

## 2018-05-22 NOTE — Telephone Encounter (Signed)
Returning  Call to be screened for visit 03.26.2020

## 2018-05-23 NOTE — Progress Notes (Signed)
EPIC Encounter for ICM Monitoring  Patient Name: Emily [name] is a 68 y.o. female Date: 05/23/2018 Primary Care Physican: Kathrene Alu, MD Primary Cardiologist:Koneswaran Electrophysiologist:Taylor Bi-V Pacing:97.7% 04/16/2018 Weight:128lbs (baseline 129 lbs) 05/23/2018 Weight:  130 lbs  Clinical Status (04-May-2018 to 21-May-2018)  Treated VT/VF 0 episodes V. Pacing 97.7% Lower Rate 60 bpm  AT/AF 1 episode Atrial Pacing <0.1% Upper Rate 120 bpm  Time in AT/AF 24.0 hr/day (100.0%) Battery OK Observations (1) (04-May-2018 to 21-May-2018)  AT/AF >= 6 hr for 17 days.  Longest AT/AF 7 months      Heart failure questions reviewed.  Pt is asymptomatic.        Thoracic impedance normal.   Prescribed:Furosemide40 mg take 1 tablettwice daily.Potassium 20 mEq 1 tablet every other day.   Labs: 08/09/2017 Creatinine3.00, Emily [name], Potassium4.3, Sodium130, [id number]  08/08/2017 Creatinine3.36, BUN54, Potassium4.2, Sodium131, [id number]  08/07/2017 Creatinine3.45, BUN51, Potassium4.3, Sodium130, [id number]  08/06/2017 Creatinine3.29, BUN47, Potassium4.4, [id number], [id number] Creatinine range 2.04 to 3.18 in 2018 per Care Everywhere  Recommendations:  Advised to call if she continues to gain weight or have other fluid symptoms.   Follow-up plan: ICM clinic phone appointment on4/27/2020.   Copy of ICM check sent to Dr.Taylor.   3 month ICM trend: 05/21/2018    1 Year ICM trend:       Emily Billings, RN 05/23/2018 8:15 AM

## 2018-05-24 ENCOUNTER — Ambulatory Visit (INDEPENDENT_AMBULATORY_CARE_PROVIDER_SITE_OTHER): Payer: PPO | Admitting: *Deleted

## 2018-05-24 DIAGNOSIS — I4891 Unspecified atrial fibrillation: Secondary | ICD-10-CM

## 2018-05-24 DIAGNOSIS — I482 Chronic atrial fibrillation, unspecified: Secondary | ICD-10-CM | POA: Diagnosis not present

## 2018-05-24 DIAGNOSIS — I513 Intracardiac thrombosis, not elsewhere classified: Secondary | ICD-10-CM | POA: Diagnosis not present

## 2018-05-24 DIAGNOSIS — Z5181 Encounter for therapeutic drug level monitoring: Secondary | ICD-10-CM | POA: Diagnosis not present

## 2018-05-24 LAB — POCT INR: INR: 4.5 — AB (ref 2.0–3.0)

## 2018-05-24 NOTE — Patient Instructions (Signed)
Hold coumadin tonight and tomorrow night then resume 2 tablets daily except 1 tablet on Sundays,  Wednesdays, and Fridays. Recheck in 2 weeks

## 2018-06-06 ENCOUNTER — Telehealth: Payer: Self-pay | Admitting: Cardiovascular Disease

## 2018-06-06 NOTE — Telephone Encounter (Signed)
06/06/18 LMTCB needs to be pre-registered/Covid 19 Screened-srs COVID-19 Pre-Screening Questions:   Do you currently have a fever?     Have you recently travelled on a cruise, internationally, or to [place], [place], [place], [place], [place], or [place], [place] [employer]) ?     Have you been in contact with someone that is currently pending confirmation of Covid19 testing or has been confirmed to have the [place] virus?     Are you currently experiencing fatigue or cough?

## 2018-06-06 NOTE — Telephone Encounter (Signed)
Answered No to all covid 19 screening questions

## 2018-06-07 ENCOUNTER — Ambulatory Visit (INDEPENDENT_AMBULATORY_CARE_PROVIDER_SITE_OTHER): Payer: PPO | Admitting: *Deleted

## 2018-06-07 DIAGNOSIS — F17200 Nicotine dependence, unspecified, uncomplicated: Secondary | ICD-10-CM | POA: Diagnosis not present

## 2018-06-07 DIAGNOSIS — E7800 Pure hypercholesterolemia, unspecified: Secondary | ICD-10-CM | POA: Diagnosis not present

## 2018-06-07 DIAGNOSIS — G459 Transient cerebral ischemic attack, unspecified: Secondary | ICD-10-CM | POA: Diagnosis not present

## 2018-06-07 DIAGNOSIS — Z5181 Encounter for therapeutic drug level monitoring: Secondary | ICD-10-CM

## 2018-06-07 DIAGNOSIS — I482 Chronic atrial fibrillation, unspecified: Secondary | ICD-10-CM | POA: Diagnosis not present

## 2018-06-07 DIAGNOSIS — I513 Intracardiac thrombosis, not elsewhere classified: Secondary | ICD-10-CM | POA: Diagnosis not present

## 2018-06-07 DIAGNOSIS — K219 Gastro-esophageal reflux disease without esophagitis: Secondary | ICD-10-CM | POA: Diagnosis not present

## 2018-06-07 DIAGNOSIS — J449 Chronic obstructive pulmonary disease, unspecified: Secondary | ICD-10-CM | POA: Diagnosis not present

## 2018-06-07 DIAGNOSIS — E039 Hypothyroidism, unspecified: Secondary | ICD-10-CM | POA: Diagnosis not present

## 2018-06-07 DIAGNOSIS — I4891 Unspecified atrial fibrillation: Secondary | ICD-10-CM | POA: Diagnosis not present

## 2018-06-07 DIAGNOSIS — K2270 Barrett's esophagus without dysplasia: Secondary | ICD-10-CM | POA: Diagnosis not present

## 2018-06-07 DIAGNOSIS — D510 Vitamin B12 deficiency anemia due to intrinsic factor deficiency: Secondary | ICD-10-CM | POA: Diagnosis not present

## 2018-06-07 DIAGNOSIS — Z136 Encounter for screening for cardiovascular disorders: Secondary | ICD-10-CM | POA: Diagnosis not present

## 2018-06-07 DIAGNOSIS — N529 Male erectile dysfunction, unspecified: Secondary | ICD-10-CM | POA: Diagnosis not present

## 2018-06-07 LAB — POCT INR: INR: 3.6 — AB (ref 2.0–3.0)

## 2018-06-07 NOTE — Patient Instructions (Signed)
Hold coumadin tonight then decrease dose to 1 tablet daily except 2 tablets on Tuesdays, Thursdays and Saturdays. Recheck in 3 weeks

## 2018-06-25 ENCOUNTER — Other Ambulatory Visit: Payer: Self-pay

## 2018-06-25 ENCOUNTER — Ambulatory Visit (INDEPENDENT_AMBULATORY_CARE_PROVIDER_SITE_OTHER): Payer: PPO

## 2018-06-25 DIAGNOSIS — Z95810 Presence of automatic (implantable) cardiac defibrillator: Secondary | ICD-10-CM | POA: Diagnosis not present

## 2018-06-25 DIAGNOSIS — I5022 Chronic systolic (congestive) heart failure: Secondary | ICD-10-CM

## 2018-06-26 ENCOUNTER — Telehealth: Payer: Self-pay | Admitting: Cardiovascular Disease

## 2018-06-26 NOTE — Telephone Encounter (Signed)
Returned pt call, she wanted to see if Dr. Bronson Ing would call her in antibiotics for her foot. I advised her to call her PCP.

## 2018-06-26 NOTE — Progress Notes (Signed)
EPIC Encounter for ICM Monitoring  Patient Name: Emily [name] is a 68 y.o. female Date: 06/26/2018 Primary Care Physican: Kathrene Alu, MD Primary Cardiologist:Koneswaran Electrophysiologist:Taylor Bi-V Pacing:96.6% 05/23/2018 Weight: 130 lbs 06/26/2018 Weight: 128.8 lbs (baseline 129 lbs)  Clinical Status (21-May-2018 to 25-Jun-2018)  AT/AF1 episode  Time in AT/AF   24.0 hr/day (100.0%)  Observations (1) (21-May-2018 to 25-Jun-2018)  AT/AF >= 6 hr for 35 days.  Longest AT/AF  8 months      Heart failure questions reviewed. Pt is asymptomatic.   She has bursitis in her heel that is very painful but heart wise she says she is fine.    Optivol Thoracic impedance normal.   Prescribed:Furosemide40 mg take 1 tablettwice daily.Potassium 20 mEq 1 tablet every other day.  Labs: 08/09/2017 Creatinine3.00, Emily [name], Potassium4.3, Sodium130, [id number]  08/08/2017 Creatinine3.36, BUN54, Potassium4.2, Sodium131, [id number]  08/07/2017 Creatinine3.45, BUN51, Potassium4.3, Sodium130, [id number]  08/06/2017 Creatinine3.29, BUN47, Potassium4.4, [id number], [id number] Creatinine range 2.04 to 3.18 in 2018 per Care Everywhere  Recommendations:Advised to call if she develops fluid symptoms.  Follow-up plan: ICM clinic phone appointment on6/02/2018.   Copy of ICM check sent to Dr.Taylor.   3 month ICM trend: 06/25/2018    1 Year ICM trend:       [name], RN 06/26/2018 8:55 AM

## 2018-06-26 NOTE — Telephone Encounter (Signed)
Pt has a question regarding an antibiotic

## 2018-06-27 DIAGNOSIS — M7661 Achilles tendinitis, right leg: Secondary | ICD-10-CM | POA: Diagnosis not present

## 2018-06-27 DIAGNOSIS — M7731 Calcaneal spur, right foot: Secondary | ICD-10-CM | POA: Diagnosis not present

## 2018-06-27 DIAGNOSIS — E1140 Type 2 diabetes mellitus with diabetic neuropathy, unspecified: Secondary | ICD-10-CM | POA: Diagnosis not present

## 2018-06-27 DIAGNOSIS — M79671 Pain in right foot: Secondary | ICD-10-CM | POA: Diagnosis not present

## 2018-07-02 ENCOUNTER — Other Ambulatory Visit: Payer: Self-pay | Admitting: Cardiovascular Disease

## 2018-07-09 ENCOUNTER — Ambulatory Visit (INDEPENDENT_AMBULATORY_CARE_PROVIDER_SITE_OTHER): Payer: PPO | Admitting: *Deleted

## 2018-07-09 ENCOUNTER — Other Ambulatory Visit: Payer: Self-pay

## 2018-07-09 DIAGNOSIS — I4891 Unspecified atrial fibrillation: Secondary | ICD-10-CM | POA: Diagnosis not present

## 2018-07-09 DIAGNOSIS — I513 Intracardiac thrombosis, not elsewhere classified: Secondary | ICD-10-CM | POA: Diagnosis not present

## 2018-07-09 DIAGNOSIS — Z5181 Encounter for therapeutic drug level monitoring: Secondary | ICD-10-CM | POA: Diagnosis not present

## 2018-07-09 DIAGNOSIS — I482 Chronic atrial fibrillation, unspecified: Secondary | ICD-10-CM

## 2018-07-09 LAB — POCT INR: INR: 4 — AB (ref 2.0–3.0)

## 2018-07-09 NOTE — Patient Instructions (Signed)
Hold coumadin tonight then decrease dose to 1 tablet daily except 2 tablets on Thursdays Recheck in 4 weeks

## 2018-07-30 ENCOUNTER — Ambulatory Visit (INDEPENDENT_AMBULATORY_CARE_PROVIDER_SITE_OTHER): Payer: PPO

## 2018-07-30 DIAGNOSIS — I5022 Chronic systolic (congestive) heart failure: Secondary | ICD-10-CM | POA: Diagnosis not present

## 2018-07-30 DIAGNOSIS — Z95810 Presence of automatic (implantable) cardiac defibrillator: Secondary | ICD-10-CM

## 2018-08-01 ENCOUNTER — Telehealth: Payer: Self-pay

## 2018-08-01 NOTE — Telephone Encounter (Signed)
Remote ICM transmission received.  Attempted call to patient regarding ICM remote transmission and no answer or answering machine. 

## 2018-08-01 NOTE — Progress Notes (Signed)
EPIC Encounter for ICM Monitoring  Patient Name: RANELLE AUKER is a 68 y.o. female Date: 08/01/2018 Primary Care Physican: Kathrene Alu, MD Primary Cardiologist:Koneswaran Electrophysiologist:Taylor Bi-V Pacing:97.6% 06/26/2018 Weight: 128.8 lbs (baseline 129 lbs)  Clinical Status (25-Jun-2018 to 30-Jul-2018)  AT/AF 1 episode  Time in AT/AF    24.0 hr/day   Longest AT/AF   10 months  Observations (1) (25-Jun-2018 to 30-Jul-2018)  AT/AF >= 6 hr for 35 days.   Attempted call to patient and unable to reach.  Transmission reviewed.    Optivol Thoracic impedance abnormal suggesting fluid accumulation but trending back toward baseline.   Prescribed:Furosemide40 mgtake 1 tablettwice daily.Potassium 20 mEq 1 tablet every other day.  Labs: 08/09/2017 Creatinine3.00, [name], Potassium4.3, Sodium130, [id number]  08/08/2017 Creatinine3.36, BUN54, Potassium4.2, Sodium131, [id number]  08/07/2017 Creatinine3.45, BUN51, Potassium4.3, Sodium130, [id number]  08/06/2017 Creatinine3.29, BUN47, Potassium4.4, [id number], [id number] Creatinine range 2.04 to 3.18 in 2018 per Care Everywhere  Recommendations:Unable to reach.  Follow-up plan: ICM clinic phone appointment on6/19/2020 to recheck fluid levels.   Copy of ICM check sent to Dr.Taylor and Dr Bronson Ing.  3 month ICM trend: 07/30/2018    1 Year ICM trend:       [name], RN 08/01/2018 1:14 PM

## 2018-08-03 NOTE — Progress Notes (Signed)
Returned patient call as requested by voice mail message.  She is asymptomatic and feels fine.  Advised to limit salt and fluid intake but report shows fluid levels are close to baseline.  Weight is stable at 128 - 130 lbs.  No changes today and encouraged to call for fluid symptoms.  Next remote transmission scheduled 08/17/2018.

## 2018-08-06 ENCOUNTER — Ambulatory Visit (INDEPENDENT_AMBULATORY_CARE_PROVIDER_SITE_OTHER): Payer: PPO | Admitting: *Deleted

## 2018-08-06 DIAGNOSIS — Z5181 Encounter for therapeutic drug level monitoring: Secondary | ICD-10-CM

## 2018-08-06 DIAGNOSIS — R55 Syncope and collapse: Secondary | ICD-10-CM

## 2018-08-06 DIAGNOSIS — I4891 Unspecified atrial fibrillation: Secondary | ICD-10-CM

## 2018-08-06 DIAGNOSIS — I513 Intracardiac thrombosis, not elsewhere classified: Secondary | ICD-10-CM | POA: Diagnosis not present

## 2018-08-06 DIAGNOSIS — I482 Chronic atrial fibrillation, unspecified: Secondary | ICD-10-CM

## 2018-08-06 DIAGNOSIS — I420 Dilated cardiomyopathy: Secondary | ICD-10-CM

## 2018-08-06 LAB — POCT INR: INR: 2.1 (ref 2.0–3.0)

## 2018-08-06 NOTE — Patient Instructions (Signed)
Continue 1 tablet daily except 2 tablets on Thursdays Recheck in 4 weeks

## 2018-08-07 ENCOUNTER — Telehealth: Payer: Self-pay

## 2018-08-07 ENCOUNTER — Ambulatory Visit (INDEPENDENT_AMBULATORY_CARE_PROVIDER_SITE_OTHER): Payer: PPO

## 2018-08-07 DIAGNOSIS — Z95810 Presence of automatic (implantable) cardiac defibrillator: Secondary | ICD-10-CM

## 2018-08-07 DIAGNOSIS — I5022 Chronic systolic (congestive) heart failure: Secondary | ICD-10-CM

## 2018-08-07 LAB — CUP PACEART REMOTE DEVICE CHECK
Battery Remaining Longevity: 26 mo
Battery Voltage: 2.95 V
Brady Statistic AP VP Percent: 0.03 %
Brady Statistic AP VS Percent: 0 %
Brady Statistic AS VP Percent: 97.54 %
Brady Statistic AS VS Percent: 2.42 %
Brady Statistic RA Percent Paced: 0.02 %
Brady Statistic RV Percent Paced: 97.76 %
Date Time Interrogation Session: 20200609141421
HighPow Impedance: 68 Ohm
Implantable Lead Implant Date: 20050218
Implantable Lead Implant Date: 20050218
Implantable Lead Implant Date: 20050218
Implantable Lead Location: 753858
Implantable Lead Location: 753859
Implantable Lead Location: 753860
Implantable Lead Model: 4194
Implantable Lead Model: 5076
Implantable Lead Model: 6947
Implantable Pulse Generator Implant Date: 20160413
Lead Channel Impedance Value: 399 Ohm
Lead Channel Impedance Value: 418 Ohm
Lead Channel Impedance Value: 456 Ohm
Lead Channel Impedance Value: 589 Ohm
Lead Channel Impedance Value: 665 Ohm
Lead Channel Impedance Value: 950 Ohm
Lead Channel Pacing Threshold Amplitude: 1.375 V
Lead Channel Pacing Threshold Pulse Width: 0.4 ms
Lead Channel Sensing Intrinsic Amplitude: 0.375 mV
Lead Channel Sensing Intrinsic Amplitude: 4.875 mV
Lead Channel Setting Pacing Amplitude: 2 V
Lead Channel Setting Pacing Amplitude: 2.25 V
Lead Channel Setting Pacing Amplitude: 2.75 V
Lead Channel Setting Pacing Pulse Width: 0.4 ms
Lead Channel Setting Pacing Pulse Width: 0.4 ms
Lead Channel Setting Sensing Sensitivity: 0.3 mV

## 2018-08-07 NOTE — Telephone Encounter (Signed)
Spoke with patient to remind of missed remote transmission 

## 2018-08-07 NOTE — Progress Notes (Signed)
EPIC Encounter for ICM Monitoring  Patient Name: Emily [name] is a 68 y.o. female Date: 08/07/2018 Primary Care Physican: Kathrene Alu, MD Primary Cardiologist:Koneswaran Electrophysiologist:Taylor Bi-V Pacing:97.6% 06/26/2018 Weight:128.8lbs (baseline 129 lbs) 08/07/2018 Weight: 129 lbs  Clinical Status (30-Jul-2018 to 07-Aug-2018)  AT/AF 1 episode  Time in AT/AF    24.0 hr/day   Longest AT/AF   10 months  Observations (1) (30-Jul-2018 to 07-Aug-2018)  AT/AF >= 6 hr for 9 days.   Spoke with patient. Transmission reviewed. She is feeling fine and weight is stable.    OptivolThoracic impedance returned to normal since 07/30/2018 transmission.  Prescribed:Furosemide40 mgtake 1 tablettwice daily.Potassium 20 mEq 1 tablet every other day.  Labs: 08/09/2017 Creatinine3.00, [name], Potassium4.3, Sodium130, [id number]  08/08/2017 Creatinine3.36, BUN54, Potassium4.2, Sodium131, [id number]  08/07/2017 Creatinine3.45, BUN51, Potassium4.3, Sodium130, [id number]  08/06/2017 Creatinine3.29, BUN47, Potassium4.4, [id number], [id number] Creatinine range 2.04 to 3.18 in 2018 per Care Everywhere  Recommendations:No changes and encouraged to call if experiencing any fluid symptoms.  Follow-up plan: ICM clinic phone appointment on7/07/2018.  Copy of ICM check sent to Dr.Taylor.  3 month ICM trend: 08/07/2018    1 Year ICM trend:       [name], RN 08/07/2018 11:16 AM

## 2018-08-14 NOTE — Progress Notes (Signed)
Remote ICD transmission.   

## 2018-08-29 ENCOUNTER — Telehealth: Payer: Self-pay | Admitting: *Deleted

## 2018-08-29 NOTE — Telephone Encounter (Signed)

## 2018-08-30 ENCOUNTER — Other Ambulatory Visit: Payer: Self-pay

## 2018-08-30 ENCOUNTER — Ambulatory Visit (INDEPENDENT_AMBULATORY_CARE_PROVIDER_SITE_OTHER): Payer: PPO | Admitting: *Deleted

## 2018-08-30 DIAGNOSIS — Z5181 Encounter for therapeutic drug level monitoring: Secondary | ICD-10-CM

## 2018-08-30 DIAGNOSIS — I4891 Unspecified atrial fibrillation: Secondary | ICD-10-CM

## 2018-08-30 DIAGNOSIS — I513 Intracardiac thrombosis, not elsewhere classified: Secondary | ICD-10-CM | POA: Diagnosis not present

## 2018-08-30 LAB — POCT INR: INR: 1.9 — AB (ref 2.0–3.0)

## 2018-08-30 NOTE — Patient Instructions (Signed)
Increase coumadin to 1 tablet daily except 2 tablets on Mondays and Thursdays Recheck in 4 weeks

## 2018-09-03 ENCOUNTER — Ambulatory Visit (INDEPENDENT_AMBULATORY_CARE_PROVIDER_SITE_OTHER): Payer: PPO

## 2018-09-03 DIAGNOSIS — Z95810 Presence of automatic (implantable) cardiac defibrillator: Secondary | ICD-10-CM

## 2018-09-03 DIAGNOSIS — I5022 Chronic systolic (congestive) heart failure: Secondary | ICD-10-CM

## 2018-09-04 ENCOUNTER — Telehealth: Payer: Self-pay | Admitting: *Deleted

## 2018-09-04 NOTE — Telephone Encounter (Signed)
Pt wants to let MD know that she is doing fine but she does not want to come out for a visit yet.  Christen Bame, CMA

## 2018-09-04 NOTE — Progress Notes (Signed)
EPIC Encounter for ICM Monitoring  Patient Name: Emily [name] is a 68 y.o. female Date: 09/04/2018 Primary Care Physican: Kathrene Alu, MD Primary Cardiologist:Koneswaran Electrophysiologist:Taylor Bi-V Pacing:96% 08/07/2018 Weight: 129 lbs  Clinical Status (07-August-2018 to 03-Sep-2018)  AT/AF 1 episode  Time in AT/AF 24.0 hr/day   Longest AT/AF 11 months  Observations (1) (07-August-2018 to 03-Sep-2018)  AT/AF >= 6 hr for 27 days.   Spoke with patient.Transmission reviewed. She is feeling fine and denies fluid symptoms   OptivolThoracic impedancenormal.  Prescribed:Furosemide40 mgtake 1 tablettwice daily.Potassium 20 mEq 1 tablet every other day.  Labs: 08/09/2017 Creatinine3.00, [name], Potassium4.3, Sodium130, [id number]  08/08/2017 Creatinine3.36, BUN54, Potassium4.2, Sodium131, [id number]  08/07/2017 Creatinine3.45, BUN51, Potassium4.3, Sodium130, [id number]  08/06/2017 Creatinine3.29, BUN47, Potassium4.4, [id number], [id number] Creatinine range 2.04 to 3.18 in 2018 per Care Everywhere  Recommendations:No changes and encouraged to call if experiencing any fluid symptoms.  Follow-up plan: ICM clinic phone appointment on8/11/2018.  Copy of ICM check sent to Dr.Taylor.  3 month ICM trend: 09/03/2018    1 Year ICM trend:       [name], RN 09/04/2018 4:50 PM

## 2018-10-10 ENCOUNTER — Telehealth: Payer: Self-pay | Admitting: *Deleted

## 2018-10-10 NOTE — Telephone Encounter (Signed)

## 2018-10-11 ENCOUNTER — Ambulatory Visit (INDEPENDENT_AMBULATORY_CARE_PROVIDER_SITE_OTHER): Payer: PPO | Admitting: *Deleted

## 2018-10-11 ENCOUNTER — Other Ambulatory Visit: Payer: Self-pay

## 2018-10-11 DIAGNOSIS — I4891 Unspecified atrial fibrillation: Secondary | ICD-10-CM | POA: Diagnosis not present

## 2018-10-11 DIAGNOSIS — I513 Intracardiac thrombosis, not elsewhere classified: Secondary | ICD-10-CM

## 2018-10-11 DIAGNOSIS — Z5181 Encounter for therapeutic drug level monitoring: Secondary | ICD-10-CM

## 2018-10-11 LAB — POCT INR: INR: 2.1 (ref 2.0–3.0)

## 2018-10-11 NOTE — Patient Instructions (Signed)
Continue coumadin 1 tablet daily except 2 tablets on Mondays and Thursdays Recheck in 4 weeks

## 2018-10-12 NOTE — Progress Notes (Signed)
No ICM remote transmission received for 10/08/2018 and next ICM transmission scheduled for 12/03/2018.  Office defib check scheduled with Dr Lovena Le on 11/01/2018.

## 2018-10-17 ENCOUNTER — Telehealth: Payer: Self-pay | Admitting: Cardiovascular Disease

## 2018-10-17 NOTE — Telephone Encounter (Signed)
Patient called wanting to know if she can take a Cortisone inject in her foot.    [phone]

## 2018-10-17 NOTE — Telephone Encounter (Signed)
Pt on coumadin - will forward

## 2018-10-17 NOTE — Telephone Encounter (Signed)
Returned call to pt, advised it is ok to get a Cortisone injection in her foot while on Coumadin.  No interaction between Coumadin and the Cortisone injection, however if pt has to take oral Prednisone that can interact with Warfarin and we would need to check her INR sooner if she was started on Prednisone.Pt verbalized understanding.

## 2018-10-18 DIAGNOSIS — M722 Plantar fascial fibromatosis: Secondary | ICD-10-CM | POA: Diagnosis not present

## 2018-10-18 DIAGNOSIS — M79671 Pain in right foot: Secondary | ICD-10-CM | POA: Diagnosis not present

## 2018-10-18 DIAGNOSIS — M7731 Calcaneal spur, right foot: Secondary | ICD-10-CM | POA: Diagnosis not present

## 2018-11-01 ENCOUNTER — Ambulatory Visit: Payer: PPO | Admitting: Internal Medicine

## 2018-11-01 ENCOUNTER — Other Ambulatory Visit: Payer: Self-pay

## 2018-11-01 ENCOUNTER — Encounter: Payer: Self-pay | Admitting: Internal Medicine

## 2018-11-01 VITALS — BP 134/64 | HR 72 | Temp 96.8°F | Ht 63.0 in | Wt 130.6 lb

## 2018-11-01 DIAGNOSIS — I5022 Chronic systolic (congestive) heart failure: Secondary | ICD-10-CM

## 2018-11-01 LAB — CUP PACEART INCLINIC DEVICE CHECK
Date Time Interrogation Session: 20200903093334
Implantable Lead Implant Date: 20050218
Implantable Lead Implant Date: 20050218
Implantable Lead Implant Date: 20050218
Implantable Lead Location: 753858
Implantable Lead Location: 753859
Implantable Lead Location: 753860
Implantable Lead Model: 4194
Implantable Lead Model: 5076
Implantable Lead Model: 6947
Implantable Pulse Generator Implant Date: 20160413

## 2018-11-01 NOTE — Patient Instructions (Signed)
Medication Instructions:  Your physician recommends that you continue on your current medications as directed. Please refer to the Current Medication list given to you today.  If you need a refill on your cardiac medications before your next appointment, please call your pharmacy.   Lab work: NONE  If you have labs (blood work) drawn today and your tests are completely normal, you will receive your results only by: . MyChart Message (if you have MyChart) OR . A paper copy in the mail If you have any lab test that is abnormal or we need to change your treatment, we will call you to review the results.  Testing/Procedures: NONE   Follow-Up: At [facility], you and your health needs are our priority.  As part of our continuing mission to provide you with exceptional heart care, we have created designated Provider Care Teams.  These Care Teams include your primary Cardiologist (physician) and Advanced Practice Providers (APPs -  Physician Assistants and Nurse Practitioners) who all work together to provide you with the care you need, when you need it. You will need a follow up appointment in 1 years.  Please call our office 2 months in advance to schedule this appointment.  You may see None or one of the following Advanced Practice Providers on your designated Care Team:   Amber Seiler, NP . Renee Ursuy, PA-C  Any Other Special Instructions Will Be Listed Below (If Applicable). Thank you for choosing [facility]!     

## 2018-11-01 NOTE — Progress Notes (Signed)
HPI Emily [name] returns today for followup. She is a pleasant 68 yo woman with a h/o both atrial and ventricular arrhythmias. She has undergone AV node ablation. She has persistent atrial fib. She has a h/o dietary indiscretion and has undergone adjustments of her meds and dietary change., Her fluids status has improved. No sob now. No peripheral edema.  Allergies  Allergen Reactions  . Amiodarone Rash  . Codeine Other (See Comments)    Euphoria, hallucinations  . Other Palpitations    Other reaction(s): Other (See Comments) PT STATES SHE GETS REALLY HOT , DIZZY HEADED, HAS PASSED OUT TWICE , AND ITCHING WITH IV DYE Tumeric  . Iodine Rash    topical     Current Outpatient Medications  Medication Sig Dispense Refill  . acetaminophen (TYLENOL) 500 MG tablet Take 1,000 mg by mouth every 6 (six) hours as needed for mild pain.    . Calcium Carb-Cholecalciferol (CALCIUM 600-D PO) Take 1 tablet by mouth daily.     . furosemide (LASIX) 40 MG tablet Take 1 tablet (40 mg total) by mouth 2 (two) times daily. (Patient taking differently: Take 40 mg by mouth 3 (three) times daily. ) 180 tablet 3  . Glucosamine-Chondroitin (OSTEO BI-FLEX REGULAR STRENGTH PO) Take 1 tablet by mouth daily.    . Insulin Glargine 300 UNIT/ML SOPN Inject 24 Units into the skin daily. 1.5 mL 2  . levothyroxine (SYNTHROID) 50 MCG tablet Take 1 tablet (50 mcg total) by mouth daily before breakfast. Alternate every other day with 75 mcg. 30 tablet 11  . levothyroxine (SYNTHROID, LEVOTHROID) 75 MCG tablet TAKE (1) TABLET BY MOUTH ONCE DAILY BEFORE BREAKFAST 30 tablet 11  . metoprolol succinate (TOPROL-XL) 50 MG 24 hr tablet Take 1 tablet (50 mg total) by mouth daily. Take with or immediately following a meal. 90 tablet 3  . Omega-3 Fatty Acids (FISH OIL PO) Take 1,000 mg by mouth daily.     Marland Kitchen omeprazole (PRILOSEC) 40 MG capsule Take 40 mg by mouth daily.     . potassium chloride SA (K-DUR,KLOR-CON) 20 MEQ tablet Take  20 mEq by mouth every other day.     . simvastatin (ZOCOR) 20 MG tablet TAKE 1 TABLET BY MOUTH ONCE DAILY AT 6 PM. 28 tablet 11  . TOUJEO SOLOSTAR 300 UNIT/ML SOPN INJECT 24 UNITS INTO THE SKIN DAILY. 4.5 mL 11  . warfarin (COUMADIN) 2 MG tablet Take 1 tablet daily except 2 tablets on Tuesdays, Thursdays and Saturdays or as directed 130 tablet 3   No current facility-administered medications for this visit.      Past Medical History:  Diagnosis Date  . Chronic kidney disease    CKD4  . Chronic systolic dysfunction of left ventricle   . Complete heart block ([place])   . DM (diabetes mellitus) ([place])   . HTN (hypertension)   . Nonischemic cardiomyopathy ([place])   . Persistent atrial fibrillation     ROS:   All systems reviewed and negative except as noted in the HPI.   Past Surgical History:  Procedure Laterality Date  . ABLATION    . AV NODE ABLATION N/A 08/08/2017   Procedure: AV NODE ABLATION;  Surgeon: Thompson Grayer, MD;  Location: [facility];  Service: Cardiovascular;  Laterality: N/A;  . BREAST BIOPSY    . CARDIAC DEFIBRILLATOR PLACEMENT  2005   MDT Viva XT CRT-D BiV ICD implanted by Dr Rosaland Lao in [place]  . CATARACT EXTRACTION Bilateral   .  CHOLECYSTECTOMY    . THYROIDECTOMY    . TOTAL VAGINAL HYSTERECTOMY       Family History  Problem Relation Age of Onset  . Hip fracture Mother   . COPD Father   . CAD Sister   . Heart attack Sister      Social History   Socioeconomic History  . Marital status: Married    Spouse name: Not on file  . Number of children: Not on file  . Years of education: Not on file  . Highest education level: Not on file  Occupational History  . Not on file  Social Needs  . Financial resource strain: Not on file  . Food insecurity    Worry: Not on file    Inability: Not on file  . Transportation needs    Medical: Not on file    Non-medical: Not on file  Tobacco Use  . Smoking status: Never Smoker  . Smokeless  tobacco: Never Used  Substance and Sexual Activity  . Alcohol use: No  . Drug use: No  . Sexual activity: Not on file  Lifestyle  . Physical activity    Days per week: Not on file    Minutes per session: Not on file  . Stress: Not on file  Relationships  . Social Herbalist on phone: Not on file    Gets together: Not on file    Attends religious service: Not on file    Active member of club or organization: Not on file    Attends meetings of clubs or organizations: Not on file    Relationship status: Not on file  . Intimate partner violence    Fear of current or ex partner: Not on file    Emotionally abused: Not on file    Physically abused: Not on file    Forced sexual activity: Not on file  Other Topics Concern  . Not on file  Social History Narrative  . Not on file     BP 134/64   Pulse 72   Temp (!) 96.8 F (36 C)   Ht 5\' 3"  (1.6 m)   Wt 130 lb 9.6 oz (59.2 kg)   SpO2 98%   BMI 23.13 kg/m   Physical Exam:  Well appearing NAD HEENT: Unremarkable Neck:  No JVD, no thyromegally Lymphatics:  No adenopathy Back:  No CVA tenderness Lungs:  Clear with no wheezes HEART:  Regular rate rhythm, no murmurs, no rubs, no clicks Abd:  soft, positive bowel sounds, no organomegally, no rebound, no guarding Ext:  2 plus pulses, no edema, no cyanosis, no clubbing Skin:  No rashes no nodules Neuro:  CN II through XII intact, motor grossly intact  DEVICE  Normal device function.  See PaceArt for details.   Assess/Plan: 1. Chronic systolic heart failure - her symptoms are class 2A. She will continue her current meds.  2. ICD - her medtronic biv ICD is working normally. We will recheck in several months. 3. Atrial fib - her ventricular rates are well controlled.   Mikle Bosworth.D.

## 2018-11-06 ENCOUNTER — Ambulatory Visit: Payer: PPO | Admitting: Podiatry

## 2018-11-06 ENCOUNTER — Other Ambulatory Visit: Payer: Self-pay

## 2018-11-06 ENCOUNTER — Ambulatory Visit (INDEPENDENT_AMBULATORY_CARE_PROVIDER_SITE_OTHER): Payer: PPO | Admitting: *Deleted

## 2018-11-06 ENCOUNTER — Ambulatory Visit (INDEPENDENT_AMBULATORY_CARE_PROVIDER_SITE_OTHER): Payer: PPO

## 2018-11-06 ENCOUNTER — Other Ambulatory Visit: Payer: Self-pay | Admitting: Podiatry

## 2018-11-06 DIAGNOSIS — I420 Dilated cardiomyopathy: Secondary | ICD-10-CM

## 2018-11-06 DIAGNOSIS — M7731 Calcaneal spur, right foot: Secondary | ICD-10-CM

## 2018-11-06 DIAGNOSIS — M779 Enthesopathy, unspecified: Secondary | ICD-10-CM | POA: Diagnosis not present

## 2018-11-06 DIAGNOSIS — M722 Plantar fascial fibromatosis: Secondary | ICD-10-CM

## 2018-11-06 NOTE — Patient Instructions (Signed)

## 2018-11-11 LAB — CUP PACEART REMOTE DEVICE CHECK
Battery Remaining Longevity: 24 mo
Battery Voltage: 2.94 V
Brady Statistic AP VP Percent: 0.01 %
Brady Statistic AP VS Percent: 0 %
Brady Statistic AS VP Percent: 96.82 %
Brady Statistic AS VS Percent: 3.17 %
Brady Statistic RA Percent Paced: 0.01 %
Brady Statistic RV Percent Paced: 97.22 %
Date Time Interrogation Session: 20200912052403
HighPow Impedance: 72 Ohm
Implantable Lead Implant Date: 20050218
Implantable Lead Implant Date: 20050218
Implantable Lead Implant Date: 20050218
Implantable Lead Location: 753858
Implantable Lead Location: 753859
Implantable Lead Location: 753860
Implantable Lead Model: 4194
Implantable Lead Model: 5076
Implantable Lead Model: 6947
Implantable Pulse Generator Implant Date: 20160413
Lead Channel Impedance Value: 399 Ohm
Lead Channel Impedance Value: 399 Ohm
Lead Channel Impedance Value: 475 Ohm
Lead Channel Impedance Value: 589 Ohm
Lead Channel Impedance Value: 665 Ohm
Lead Channel Impedance Value: 988 Ohm
Lead Channel Pacing Threshold Amplitude: 0.75 V
Lead Channel Pacing Threshold Amplitude: 0.875 V
Lead Channel Pacing Threshold Amplitude: 1.375 V
Lead Channel Pacing Threshold Pulse Width: 0.4 ms
Lead Channel Pacing Threshold Pulse Width: 0.4 ms
Lead Channel Pacing Threshold Pulse Width: 0.4 ms
Lead Channel Sensing Intrinsic Amplitude: 0.5 mV
Lead Channel Sensing Intrinsic Amplitude: 0.5 mV
Lead Channel Sensing Intrinsic Amplitude: 5 mV
Lead Channel Sensing Intrinsic Amplitude: 5.25 mV
Lead Channel Setting Pacing Amplitude: 2 V
Lead Channel Setting Pacing Amplitude: 2.25 V
Lead Channel Setting Pacing Amplitude: 2.75 V
Lead Channel Setting Pacing Pulse Width: 0.4 ms
Lead Channel Setting Pacing Pulse Width: 0.4 ms
Lead Channel Setting Sensing Sensitivity: 0.3 mV

## 2018-11-12 ENCOUNTER — Ambulatory Visit: Payer: PPO | Admitting: Podiatry

## 2018-11-14 ENCOUNTER — Other Ambulatory Visit: Payer: Self-pay | Admitting: Family Medicine

## 2018-11-15 ENCOUNTER — Other Ambulatory Visit: Payer: Self-pay

## 2018-11-15 ENCOUNTER — Ambulatory Visit (INDEPENDENT_AMBULATORY_CARE_PROVIDER_SITE_OTHER): Payer: PPO | Admitting: *Deleted

## 2018-11-15 DIAGNOSIS — Z5181 Encounter for therapeutic drug level monitoring: Secondary | ICD-10-CM | POA: Diagnosis not present

## 2018-11-15 DIAGNOSIS — I513 Intracardiac thrombosis, not elsewhere classified: Secondary | ICD-10-CM | POA: Diagnosis not present

## 2018-11-15 DIAGNOSIS — I4891 Unspecified atrial fibrillation: Secondary | ICD-10-CM | POA: Diagnosis not present

## 2018-11-15 LAB — POCT INR: INR: 2.7 (ref 2.0–3.0)

## 2018-11-15 NOTE — Patient Instructions (Signed)
Continue coumadin 1 tablet daily except 2 tablets on Mondays and Thursdays Recheck in 6 weeks

## 2018-11-18 NOTE — Progress Notes (Signed)
Subjective:   Patient ID: Emily [name], female   DOB: 68 y.o.   MRN: [id number]   HPI 68 year old female presents the office today for concerns of pain to the posterior aspect of right heel which is been ongoing for last 2 years.  She states that began after she had a fall.  The pain is intermittent and she describes aching, discomfort to the posterior heel.  She is not had any recent treatment.  Minimal swelling.  No redness or warmth.  No other concerns.   Review of Systems  All other systems reviewed and are negative.  Past Medical History:  Diagnosis Date  . Chronic kidney disease    CKD4  . Chronic systolic dysfunction of left ventricle   . Complete heart block ([place])   . DM (diabetes mellitus) ([place])   . HTN (hypertension)   . Nonischemic cardiomyopathy ([place])   . Persistent atrial fibrillation     Past Surgical History:  Procedure Laterality Date  . ABLATION    . AV NODE ABLATION N/A 08/08/2017   Procedure: AV NODE ABLATION;  Surgeon: Thompson Grayer, MD;  Location: [facility];  Service: Cardiovascular;  Laterality: N/A;  . BREAST BIOPSY    . CARDIAC DEFIBRILLATOR PLACEMENT  2005   MDT Viva XT CRT-D BiV ICD implanted by Dr Rosaland Lao in [place]    . THYROIDECTOMY    . TOTAL VAGINAL HYSTERECTOMY       Current Outpatient Medications:  .  acetaminophen (TYLENOL) 500 MG tablet, Take 1,000 mg by mouth every 6 (six) hours as needed for mild pain., Disp: , Rfl:  .  Calcium Carb-Cholecalciferol (CALCIUM 600-D PO), Take 1 tablet by mouth daily. , Disp: , Rfl:  .  furosemide (LASIX) 40 MG tablet, Take 1 tablet (40 mg total) by mouth 2 (two) times daily. (Patient taking differently: Take 40 mg by mouth 3 (three) times daily. ), Disp: 180 tablet, Rfl: 3 .  Glucosamine-Chondroitin (OSTEO BI-FLEX REGULAR STRENGTH PO), Take 1 tablet by mouth daily., Disp: , Rfl:  .  Insulin Glargine 300 UNIT/ML SOPN, Inject 24 Units  into the skin daily., Disp: 1.5 mL, Rfl: 2 .  levothyroxine (SYNTHROID) 50 MCG tablet, Take 1 tablet (50 mcg total) by mouth daily before breakfast. Alternate every other day with 75 mcg., Disp: 30 tablet, Rfl: 11 .  Omega-3 Fatty Acids (FISH OIL PO), Take 1,000 mg by mouth daily. , Disp: , Rfl:  .  omeprazole (PRILOSEC) 40 MG capsule, Take 40 mg by mouth daily. , Disp: , Rfl:  .  potassium chloride SA (K-DUR,KLOR-CON) 20 MEQ tablet, Take 20 mEq by mouth every other day. , Disp: , Rfl:  .  simvastatin (ZOCOR) 20 MG tablet, TAKE 1 TABLET BY MOUTH ONCE DAILY AT 6 PM., Disp: 28 tablet, Rfl: 11 .  TOUJEO SOLOSTAR 300 UNIT/ML SOPN, INJECT 24 UNITS INTO THE SKIN DAILY., Disp: 4.5 mL, Rfl: 11 .  warfarin (COUMADIN) 2 MG tablet, Take 1 tablet daily except 2 tablets on Tuesdays, Thursdays and Saturdays or as directed, Disp: 130 tablet, Rfl: 3 .  levothyroxine (SYNTHROID) 75 MCG tablet, TAKE (1) TABLET BY MOUTH ONCE DAILY BEFORE BREAKFAST, Disp: 28 tablet, Rfl: 11 .  metoprolol succinate (TOPROL-XL) 50 MG 24 hr tablet, Take 1 tablet (50 mg total) by mouth daily. Take with or immediately following a meal., Disp: 90 tablet, Rfl: 3  Allergies  Allergen Reactions  . Amiodarone Rash  .  Codeine Other (See Comments)    Euphoria, hallucinations  . Other Palpitations    Other reaction(s): Other (See Comments) PT STATES SHE GETS REALLY HOT , DIZZY HEADED, HAS PASSED OUT TWICE , AND ITCHING WITH IV DYE Tumeric  . Iodine Rash    topical         Objective:  Physical Exam  General: AAO x3, NAD  Dermatological: Skin is warm, dry and supple bilateral. Nails x 10 are well manicured; remaining integument appears unremarkable at this time. There are no open sores, no preulcerative lesions, no rash or signs of infection present.  Vascular: Dorsalis Pedis artery and Posterior Tibial artery pedal pulses are 2/4 bilateral with immedate capillary fill time. There is no pain with calf compression, swelling, warmth,  erythema.   Neruologic: Grossly intact via light touch bilateral. Protective threshold with Semmes Wienstein monofilament intact to all pedal sites bilateral.  Musculoskeletal: Tenderness palpation of the posterior aspect of the right heel on insertion of Achilles tendon to the posterior calcaneus.  Thompson test is negative.  No pain with lateral compression of the calcaneus.  There is no erythema or warmth.  No pain in the plantar fascia.  Muscular strength 5/5 in all groups tested bilateral.  Gait: Unassisted, Nonantalgic.       Assessment:   Insertional Achilles tendinitis, posterior heel spur     Plan:  -Treatment options discussed including all alternatives, risks, and complications -Etiology of symptoms were discussed -X-rays were obtained and reviewed with the patient.  Likely old avulsion fracture to the posterior aspect of calcaneus but no evidence of acute fracture. -We discussed traction, icing exercises daily. Night splint dispensed. Posterior gel sleeve dispensed.  Heel lifts.   Return in about 4 weeks (around 12/04/2018).  Trula Slade DPM

## 2018-11-19 ENCOUNTER — Telehealth: Payer: Self-pay | Admitting: *Deleted

## 2018-11-19 DIAGNOSIS — M7731 Calcaneal spur, right foot: Secondary | ICD-10-CM

## 2018-11-19 DIAGNOSIS — M779 Enthesopathy, unspecified: Secondary | ICD-10-CM

## 2018-11-19 NOTE — Telephone Encounter (Signed)
I believe at this time we should start physical therapy if she is willing to do so.

## 2018-11-19 NOTE — Telephone Encounter (Signed)
Pt states Dr. Jacqualyn Posey had asked her to call in 2 weeks with her status and she says she is no better and wanted to know what to do.

## 2018-11-20 NOTE — Telephone Encounter (Signed)
I informed pt of Dr. Leigh Aurora recommendation for PT.

## 2018-11-21 ENCOUNTER — Telehealth: Payer: Self-pay | Admitting: Cardiovascular Disease

## 2018-11-21 NOTE — Telephone Encounter (Signed)
Virtual Visit Pre-Appointment Phone Call  "(Name), I am calling you today to discuss your upcoming appointment. We are currently trying to limit exposure to the virus that causes COVID-19 by seeing patients at home rather than in the office."  1. "What is the BEST phone number to call the day of the visit?" - include this in appointment notes  2. Do you have or have access to (through a family member/friend) a smartphone with video capability that we can use for your visit?" a. If yes - list this number in appt notes as cell (if different from BEST phone #) and list the appointment type as a VIDEO visit in appointment notes b. If no - list the appointment type as a PHONE visit in appointment notes  Confirm consent - "In the setting of the current Covid19 crisis, you are scheduled for a (phone or video) visit with your provider on (date) at (time).  Just as we do with many in-office visits, in order for you to participate in this visit, we must obtain consent.  If you'd like, I can send this to your mychart (if signed up) or email for you to review.  Otherwise, I can obtain your verbal consent now.  All virtual visits are billed to your insurance company just like a normal visit would be.  By agreeing to a virtual visit, we'd like you to understand that the technology does not allow for your provider to perform an examination, and thus may limit your provider's ability to fully assess your condition. If your provider identifies any concerns that need to be evaluated in person, we will make arrangements to do so.  Finally, though the technology is pretty good, we cannot assure that it will always work on either your or our end, and in the setting of a video visit, we may have to convert it to a phone-only visit.  In either situation, we cannot ensure that we have a secure connection.  Are you willing to proceed?" STAFF: Did the patient verbally acknowledge consent to telehealth visit? Document  YES/NO here:yes 3. Advise patient to be prepared - "Two hours prior to your appointment, go ahead and check your blood pressure, pulse, oxygen saturation, and your weight (if you have the equipment to check those) and write them all down. When your visit starts, your provider will ask you for this information. If you have an Apple Watch or Kardia device, please plan to have heart rate information ready on the day of your appointment. Please have a pen and paper handy nearby the day of the visit as well."  4. Give patient instructions for MyChart download to smartphone OR [email or web address] as below if video visit (depending on what platform provider is using)  5. Inform patient they will receive a phone call 15 minutes prior to their appointment time (may be from unknown caller ID) so they should be prepared to answer    TELEPHONE CALL NOTE  Emily [name] has been deemed a candidate for a follow-up tele-health visit to limit community exposure during the Covid-19 pandemic. I spoke with the patient via phone to ensure availability of phone/video source, confirm preferred email & phone number, and discuss instructions and expectations.  I reminded Emily [name] to be prepared with any vital sign and/or heart rhythm information that could potentially be obtained via home monitoring, at the time of her visit. I reminded Emily [name] to expect a phone call prior to her visit.  Emily [name] 11/21/2018 1:38 PM   INSTRUCTIONS FOR DOWNLOADING THE MYCHART APP TO SMARTPHONE  - The patient must first make sure to have activated MyChart and know their login information - If Apple, go to [employer] and type in MyChart in the search bar and download the app. If Android, ask patient to go to [employer] and type in [facility] in the search bar and download the app. The app is free but as with any other app downloads, their phone may require them to verify saved payment information or Apple/Android  password.  - The patient will need to then log into the app with their MyChart username and password, and select [facility] as their healthcare provider to link the account. When it is time for your visit, go to the MyChart app, find appointments, and click Begin Video Visit. Be sure to Select Allow for your device to access the Microphone and Camera for your visit. You will then be connected, and your provider will be with you shortly.  **If they have any issues connecting, or need assistance please contact MyChart service desk [phone])**  **If using a computer, in order to ensure the best quality for their visit they will need to use either of the following Internet Browsers: [employer], or Google Chrome**  IF USING DOXIMITY or DOXY.ME - The patient will receive a link just prior to their visit by text.     FULL LENGTH CONSENT FOR TELE-HEALTH VISIT   I hereby voluntarily request, consent and authorize [facility] and its employed or contracted physicians, physician assistants, nurse practitioners or other licensed health care professionals (the Practitioner), to provide me with telemedicine health care services (the Services") as deemed necessary by the treating Practitioner. I acknowledge and consent to receive the Services by the Practitioner via telemedicine. I understand that the telemedicine visit will involve communicating with the Practitioner through live audiovisual communication technology and the disclosure of certain medical information by electronic transmission. I acknowledge that I have been given the opportunity to request an in-person assessment or other available alternative prior to the telemedicine visit and am voluntarily participating in the telemedicine visit.  I understand that I have the right to withhold or withdraw my consent to the use of telemedicine in the course of my care at any time, without affecting my right to future care or treatment,  and that the Practitioner or I may terminate the telemedicine visit at any time. I understand that I have the right to inspect all information obtained and/or recorded in the course of the telemedicine visit and may receive copies of available information for a reasonable fee.  I understand that some of the potential risks of receiving the Services via telemedicine include:   Delay or interruption in medical evaluation due to technological equipment failure or disruption;  Information transmitted may not be sufficient (e.g. poor resolution of images) to allow for appropriate medical decision making by the Practitioner; and/or   In rare instances, security protocols could fail, causing a breach of personal health information.  Furthermore, I acknowledge that it is my responsibility to provide information about my medical history, conditions and care that is complete and accurate to the best of my ability. I acknowledge that Practitioner's advice, recommendations, and/or decision may be based on factors not within their control, such as incomplete or inaccurate data provided by me or distortions of diagnostic images or specimens that may result from electronic transmissions. I understand that the  practice of medicine is not an Chief Strategy Officer and that Practitioner makes no warranties or guarantees regarding treatment outcomes. I acknowledge that I will receive a copy of this consent concurrently upon execution via email to the email address I last provided but may also request a printed copy by calling the office of [facility].    I understand that my insurance will be billed for this visit.   I have read or had this consent read to me.  I understand the contents of this consent, which adequately explains the benefits and risks of the Services being provided via telemedicine.   I have been provided ample opportunity to ask questions regarding this consent and the Services and have had my questions  answered to my satisfaction.  I give my informed consent for the services to be provided through the use of telemedicine in my medical care  By participating in this telemedicine visit I agree to the above.

## 2018-11-22 ENCOUNTER — Encounter: Payer: Self-pay | Admitting: Cardiology

## 2018-11-22 NOTE — Progress Notes (Signed)
Remote ICD transmission.   

## 2018-11-23 ENCOUNTER — Telehealth: Payer: Self-pay | Admitting: *Deleted

## 2018-11-23 NOTE — Telephone Encounter (Signed)
Pt called asked if the crack bone was old or new so she will know how long it will take to heal.

## 2018-11-26 NOTE — Telephone Encounter (Signed)
It appears to be old. I believe that the pain she is getting is more from the bone spur and tendonitis. I think that she should start to see some relief shortly with the treatment we discussed.

## 2018-11-26 NOTE — Telephone Encounter (Signed)
I informed pt of Dr. Suszanne Finch statement concerning the age of her fracture and treatment. Pt states she is having PT on Wednesday.

## 2018-11-27 ENCOUNTER — Telehealth (INDEPENDENT_AMBULATORY_CARE_PROVIDER_SITE_OTHER): Payer: PPO | Admitting: Cardiovascular Disease

## 2018-11-27 ENCOUNTER — Encounter: Payer: Self-pay | Admitting: Cardiovascular Disease

## 2018-11-27 VITALS — Ht 63.0 in | Wt 130.0 lb

## 2018-11-27 DIAGNOSIS — I4819 Other persistent atrial fibrillation: Secondary | ICD-10-CM

## 2018-11-27 DIAGNOSIS — I5022 Chronic systolic (congestive) heart failure: Secondary | ICD-10-CM

## 2018-11-27 DIAGNOSIS — E785 Hyperlipidemia, unspecified: Secondary | ICD-10-CM

## 2018-11-27 DIAGNOSIS — Z95810 Presence of automatic (implantable) cardiac defibrillator: Secondary | ICD-10-CM

## 2018-11-27 DIAGNOSIS — Z9889 Other specified postprocedural states: Secondary | ICD-10-CM

## 2018-11-27 DIAGNOSIS — I10 Essential (primary) hypertension: Secondary | ICD-10-CM
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
## 2018-11-27 NOTE — Patient Instructions (Addendum)
Medication Instructions:   Your physician recommends that you continue on your current medications as directed. Please refer to the Current Medication list given to you today.  Labwork:  NONE  Testing/Procedures:  NONE  Follow-Up:  Your physician recommends that you schedule a follow-up appointment in: 3 months.  Any Other Special Instructions Will Be Listed Below (If Applicable).  If you need a refill on your cardiac medications before your next appointment, please call your pharmacy. 

## 2018-11-28 DIAGNOSIS — M62571 Muscle wasting and atrophy, not elsewhere classified, right ankle and foot: Secondary | ICD-10-CM | POA: Diagnosis not present

## 2018-11-28 DIAGNOSIS — M79671 Pain in right foot: Secondary | ICD-10-CM | POA: Diagnosis not present

## 2018-11-28 DIAGNOSIS — M25571 Pain in right ankle and joints of right foot: Secondary | ICD-10-CM | POA: Diagnosis not present

## 2018-11-28 DIAGNOSIS — R269 Unspecified abnormalities of gait and mobility: Secondary | ICD-10-CM | POA: Diagnosis not present

## 2018-11-30 DIAGNOSIS — M25571 Pain in right ankle and joints of right foot: Secondary | ICD-10-CM | POA: Diagnosis not present

## 2018-11-30 DIAGNOSIS — R269 Unspecified abnormalities of gait and mobility: Secondary | ICD-10-CM | POA: Diagnosis not present

## 2018-11-30 DIAGNOSIS — M62571 Muscle wasting and atrophy, not elsewhere classified, right ankle and foot: Secondary | ICD-10-CM | POA: Diagnosis not present

## 2018-11-30 DIAGNOSIS — M79671 Pain in right foot: Secondary | ICD-10-CM | POA: Diagnosis not present

## 2018-12-03 ENCOUNTER — Telehealth: Payer: Self-pay | Admitting: Pharmacist

## 2018-12-03 ENCOUNTER — Ambulatory Visit (INDEPENDENT_AMBULATORY_CARE_PROVIDER_SITE_OTHER): Payer: PPO

## 2018-12-03 DIAGNOSIS — M25571 Pain in right ankle and joints of right foot: Secondary | ICD-10-CM | POA: Diagnosis not present

## 2018-12-03 DIAGNOSIS — Z95810 Presence of automatic (implantable) cardiac defibrillator: Secondary | ICD-10-CM | POA: Diagnosis not present

## 2018-12-03 DIAGNOSIS — R269 Unspecified abnormalities of gait and mobility: Secondary | ICD-10-CM | POA: Diagnosis not present

## 2018-12-03 DIAGNOSIS — I5022 Chronic systolic (congestive) heart failure: Secondary | ICD-10-CM | POA: Diagnosis not present

## 2018-12-03 DIAGNOSIS — M62571 Muscle wasting and atrophy, not elsewhere classified, right ankle and foot: Secondary | ICD-10-CM | POA: Diagnosis not present

## 2018-12-03 DIAGNOSIS — M79671 Pain in right foot: Secondary | ICD-10-CM | POA: Diagnosis not present

## 2018-12-03 MED ORDER — APIXABAN 2.5 MG PO TABS: 2.5000 mg | ORAL_TABLET | Freq: Two times a day (BID) | ORAL | 1 refills | Status: DC

## 2018-12-03 NOTE — Telephone Encounter (Signed)
Called pt to discuss changing from warfarin to [place] due to better efficacy and safety data, as well as less frequent monitoring, especially given COVID-19 pandemic. Copay is $90 for a 3 month supply (or more expensive at $5/month) and can use 1 month free copay card. Cost is ok with pt.  Pt prefers that we discuss with Dr Bronson Ing before making this change. Pt takes warfarin for afib indication, no drug interactions with DOAC therapy. Would need new BMET/CBC (most recent SCr from > 1 year ago), pt would qualify for Eliquis 2.5mg  BID based on weight and renal function. Will route to Dr Bronson Ing to confirm pt ok to change to DOAC; if so, will coordinate at next INR check.

## 2018-12-03 NOTE — Telephone Encounter (Signed)
I agree with your recommendation to switch to DOAC.

## 2018-12-03 NOTE — Telephone Encounter (Signed)
Followed up with pt regarding change, copay is $90/3 month supply with [employer]. Pt ended up declining changing to a DOAC due to cost (insulin already costs $90/month).

## 2018-12-04 ENCOUNTER — Ambulatory Visit: Payer: PPO | Admitting: Podiatry

## 2018-12-05 NOTE — Progress Notes (Signed)
EPIC Encounter for ICM Monitoring  Patient Name: Emily [name] is a 68 y.o. female Date: 12/05/2018 Primary Care Physican: Kathrene Alu, MD Primary Cardiologist:Koneswaran Electrophysiologist:Taylor Bi-V Pacing:95.1% 12/05/2018 Weight: 128-130 lbs  Since 10-Nov-2018   AT/AF              1  Time in AT/AF  24.0 hr/day (100.0%)  Longest AT/AF  14 months  Observations (1) (10-Nov-2018 to 03-Dec-2018)  AT/AF >= 6 hr for 23 days.    Spoke with patient.Transmission reviewed.She is feeling fine and denies fluid symptoms.  She has spurs and chipped bone on heel.   OptivolThoracic impedancenormal.  Prescribed:Furosemide40 mgtake 1 tablettwice daily.Potassium 20 mEq 1 tablet every other day.  Labs: 08/09/2017 Creatinine3.00, BUN53, Potassium4.3, Sodium130, [id number]  08/08/2017 Creatinine3.36, BUN54, Potassium4.2, Sodium131, [id number]  08/07/2017 Creatinine3.45, BUN51, Potassium4.3, Sodium130, [id number]  08/06/2017 Creatinine3.29, BUN47, Potassium4.4, [id number], [id number] Creatinine range 2.04 to 3.18 in 2018 per Care Everywhere  Recommendations: No changes and encouraged to call if experiencing any fluid symptoms.  Follow-up plan: ICM clinic phone appointment on 01/07/2019.   91 day device clinic remote transmission 02/05/2019.   Copy of ICM check sent to Dr. Lovena Le.   3 month ICM trend: 12/03/2018    1 Year ICM trend:       [name], RN 12/05/2018 10:56 AM

## 2018-12-06 DIAGNOSIS — M79671 Pain in right foot: Secondary | ICD-10-CM | POA: Diagnosis not present

## 2018-12-06 DIAGNOSIS — M62571 Muscle wasting and atrophy, not elsewhere classified, right ankle and foot: Secondary | ICD-10-CM | POA: Diagnosis not present

## 2018-12-06 DIAGNOSIS — R269 Unspecified abnormalities of gait and mobility: Secondary | ICD-10-CM | POA: Diagnosis not present

## 2018-12-06 DIAGNOSIS — M25571 Pain in right ankle and joints of right foot: Secondary | ICD-10-CM | POA: Diagnosis not present

## 2018-12-07 ENCOUNTER — Other Ambulatory Visit: Payer: Self-pay | Admitting: Family Medicine

## 2018-12-10 ENCOUNTER — Other Ambulatory Visit: Payer: Self-pay | Admitting: Cardiovascular Disease

## 2018-12-10 DIAGNOSIS — M62571 Muscle wasting and atrophy, not elsewhere classified, right ankle and foot: Secondary | ICD-10-CM | POA: Diagnosis not present

## 2018-12-10 DIAGNOSIS — R269 Unspecified abnormalities of gait and mobility: Secondary | ICD-10-CM | POA: Diagnosis not present

## 2018-12-10 DIAGNOSIS — M25571 Pain in right ankle and joints of right foot: Secondary | ICD-10-CM | POA: Diagnosis not present

## 2018-12-10 DIAGNOSIS — M79671 Pain in right foot: Secondary | ICD-10-CM | POA: Diagnosis not present

## 2018-12-13 DIAGNOSIS — M62571 Muscle wasting and atrophy, not elsewhere classified, right ankle and foot: Secondary | ICD-10-CM | POA: Diagnosis not present

## 2018-12-13 DIAGNOSIS — M79671 Pain in right foot: Secondary | ICD-10-CM | POA: Diagnosis not present

## 2018-12-13 DIAGNOSIS — M25571 Pain in right ankle and joints of right foot: Secondary | ICD-10-CM | POA: Diagnosis not present

## 2018-12-13 DIAGNOSIS — R269 Unspecified abnormalities of gait and mobility: Secondary | ICD-10-CM | POA: Diagnosis not present

## 2018-12-19 DIAGNOSIS — M62571 Muscle wasting and atrophy, not elsewhere classified, right ankle and foot: Secondary | ICD-10-CM | POA: Diagnosis not present

## 2018-12-19 DIAGNOSIS — R269 Unspecified abnormalities of gait and mobility: Secondary | ICD-10-CM | POA: Diagnosis not present

## 2018-12-19 DIAGNOSIS — M79671 Pain in right foot: Secondary | ICD-10-CM | POA: Diagnosis not present

## 2018-12-19 DIAGNOSIS — M25571 Pain in right ankle and joints of right foot: Secondary | ICD-10-CM | POA: Diagnosis not present

## 2018-12-25 DIAGNOSIS — M25571 Pain in right ankle and joints of right foot: Secondary | ICD-10-CM | POA: Diagnosis not present

## 2018-12-25 DIAGNOSIS — M62571 Muscle wasting and atrophy, not elsewhere classified, right ankle and foot: Secondary | ICD-10-CM | POA: Diagnosis not present

## 2018-12-25 DIAGNOSIS — M79671 Pain in right foot: Secondary | ICD-10-CM | POA: Diagnosis not present

## 2018-12-25 DIAGNOSIS — R269 Unspecified abnormalities of gait and mobility: Secondary | ICD-10-CM | POA: Diagnosis not present

## 2018-12-27 ENCOUNTER — Other Ambulatory Visit: Payer: Self-pay

## 2018-12-27 ENCOUNTER — Ambulatory Visit (INDEPENDENT_AMBULATORY_CARE_PROVIDER_SITE_OTHER): Payer: PPO | Admitting: *Deleted

## 2018-12-27 DIAGNOSIS — Z5181 Encounter for therapeutic drug level monitoring: Secondary | ICD-10-CM

## 2018-12-27 DIAGNOSIS — I4891 Unspecified atrial fibrillation: Secondary | ICD-10-CM

## 2018-12-27 DIAGNOSIS — I513 Intracardiac thrombosis, not elsewhere classified: Secondary | ICD-10-CM | POA: Diagnosis not present

## 2018-12-27 DIAGNOSIS — Z23 Encounter for immunization: Secondary | ICD-10-CM | POA: Diagnosis not present

## 2018-12-27 LAB — POCT INR: INR: 2.9 (ref 2.0–3.0)

## 2018-12-27 NOTE — Patient Instructions (Signed)
Continue coumadin 1 tablet daily except 2 tablets on Mondays and Thursdays Eat greens/salad tonight Recheck in 6 weeks

## 2019-01-02 DIAGNOSIS — M25571 Pain in right ankle and joints of right foot: Secondary | ICD-10-CM | POA: Diagnosis not present

## 2019-01-02 DIAGNOSIS — R269 Unspecified abnormalities of gait and mobility: Secondary | ICD-10-CM | POA: Diagnosis not present

## 2019-01-02 DIAGNOSIS — M62571 Muscle wasting and atrophy, not elsewhere classified, right ankle and foot: Secondary | ICD-10-CM | POA: Diagnosis not present

## 2019-01-02 DIAGNOSIS — M79671 Pain in right foot: Secondary | ICD-10-CM | POA: Diagnosis not present

## 2019-01-07 ENCOUNTER — Ambulatory Visit (INDEPENDENT_AMBULATORY_CARE_PROVIDER_SITE_OTHER): Payer: PPO

## 2019-01-07 DIAGNOSIS — I5022 Chronic systolic (congestive) heart failure: Secondary | ICD-10-CM | POA: Diagnosis not present

## 2019-01-07 DIAGNOSIS — Z95810 Presence of automatic (implantable) cardiac defibrillator: Secondary | ICD-10-CM

## 2019-01-09 NOTE — Progress Notes (Signed)
EPIC Encounter for ICM Monitoring  Patient Name: Emily [name] is a 68 y.o. female Date: 01/09/2019 Primary Care Physican: Kathrene Alu, MD Primary Cardiologist:Koneswaran Electrophysiologist:Taylor Bi-V Pacing:95.0% 12/05/2018 Weight: 128-130 lbs  Time in AT/AF  24.0 hr/day (100.0%) Longest AT/AF  15 months    Spoke with patient and she asymptomatic.    OptivolThoracic impedancenormal.  Prescribed:Furosemide40 mgtake 1 tablettwice daily.Potassium 20 mEq 1 tablet every other day.  Labs: 08/09/2017 Creatinine3.00, BUN53, Potassium4.3, Sodium130, [id number]  08/08/2017 Creatinine3.36, BUN54, Potassium4.2, Sodium131, [id number]  08/07/2017 Creatinine3.45, BUN51, Potassium4.3, Sodium130, [id number]  08/06/2017 Creatinine3.29, BUN47, Potassium4.4, [id number], [id number] Creatinine range 2.04 to 3.18 in 2018 per Care Everywhere  Recommendations: No changes and encouraged to call if experiencing any fluid symptoms.  Follow-up plan: ICM clinic phone appointment on 02/12/2019.   91 day device clinic remote transmission 02/11/2019.    Copy of ICM check sent to Dr. Lovena Le.   3 month ICM trend: 01/07/2019    1 Year ICM trend:       [name], RN 01/09/2019 12:58 PM

## 2019-01-14 ENCOUNTER — Other Ambulatory Visit: Payer: Self-pay | Admitting: Cardiovascular Disease

## 2019-01-14 ENCOUNTER — Telehealth: Payer: Self-pay | Admitting: *Deleted

## 2019-01-14 NOTE — Telephone Encounter (Signed)
Spoke with [name] at [facility] and clarified warfarin dose.

## 2019-01-23 ENCOUNTER — Other Ambulatory Visit: Payer: Self-pay

## 2019-02-07 ENCOUNTER — Ambulatory Visit (INDEPENDENT_AMBULATORY_CARE_PROVIDER_SITE_OTHER): Payer: PPO | Admitting: *Deleted

## 2019-02-07 ENCOUNTER — Other Ambulatory Visit: Payer: Self-pay

## 2019-02-07 DIAGNOSIS — I513 Intracardiac thrombosis, not elsewhere classified: Secondary | ICD-10-CM | POA: Diagnosis not present

## 2019-02-07 DIAGNOSIS — I4891 Unspecified atrial fibrillation: Secondary | ICD-10-CM

## 2019-02-07 DIAGNOSIS — Z5181 Encounter for therapeutic drug level monitoring: Secondary | ICD-10-CM

## 2019-02-07 LAB — POCT INR: INR: 2.8 (ref 2.0–3.0)

## 2019-02-07 NOTE — Patient Instructions (Signed)
Continue coumadin 1 tablet daily except 2 tablets on Mondays and Thursdays Eat greens/salad tonight Recheck in 6 weeks

## 2019-02-11 ENCOUNTER — Other Ambulatory Visit: Payer: Self-pay | Admitting: Cardiovascular Disease

## 2019-02-11 ENCOUNTER — Ambulatory Visit (INDEPENDENT_AMBULATORY_CARE_PROVIDER_SITE_OTHER): Payer: PPO | Admitting: *Deleted

## 2019-02-11 DIAGNOSIS — Z95810 Presence of automatic (implantable) cardiac defibrillator: Secondary | ICD-10-CM

## 2019-02-11 LAB — CUP PACEART REMOTE DEVICE CHECK
Battery Remaining Longevity: 23 mo
Battery Voltage: 2.93 V
Brady Statistic AP VP Percent: 0.03 %
Brady Statistic AP VS Percent: 0 %
Brady Statistic AS VP Percent: 94.57 %
Brady Statistic AS VS Percent: 5.41 %
Brady Statistic RA Percent Paced: 0.02 %
Brady Statistic RV Percent Paced: 95.18 %
Date Time Interrogation Session: 20201214033622
HighPow Impedance: 70 Ohm
Implantable Lead Implant Date: 20050218
Implantable Lead Implant Date: 20050218
Implantable Lead Implant Date: 20050218
Implantable Lead Location: 753858
Implantable Lead Location: 753859
Implantable Lead Location: 753860
Implantable Lead Model: 4194
Implantable Lead Model: 5076
Implantable Lead Model: 6947
Implantable Pulse Generator Implant Date: 20160413
Lead Channel Impedance Value: 361 Ohm
Lead Channel Impedance Value: 418 Ohm
Lead Channel Impedance Value: 456 Ohm
Lead Channel Impedance Value: 551 Ohm
Lead Channel Impedance Value: 646 Ohm
Lead Channel Impedance Value: 950 Ohm
Lead Channel Pacing Threshold Amplitude: 0.75 V
Lead Channel Pacing Threshold Amplitude: 0.875 V
Lead Channel Pacing Threshold Amplitude: 1.25 V
Lead Channel Pacing Threshold Pulse Width: 0.4 ms
Lead Channel Pacing Threshold Pulse Width: 0.4 ms
Lead Channel Pacing Threshold Pulse Width: 0.4 ms
Lead Channel Sensing Intrinsic Amplitude: 0.5 mV
Lead Channel Sensing Intrinsic Amplitude: 0.5 mV
Lead Channel Sensing Intrinsic Amplitude: 1.5 mV
Lead Channel Sensing Intrinsic Amplitude: 1.5 mV
Lead Channel Setting Pacing Amplitude: 2 V
Lead Channel Setting Pacing Amplitude: 2.25 V
Lead Channel Setting Pacing Amplitude: 2.5 V
Lead Channel Setting Pacing Pulse Width: 0.4 ms
Lead Channel Setting Pacing Pulse Width: 0.4 ms
Lead Channel Setting Sensing Sensitivity: 0.3 mV

## 2019-02-12 ENCOUNTER — Ambulatory Visit (INDEPENDENT_AMBULATORY_CARE_PROVIDER_SITE_OTHER): Payer: PPO

## 2019-02-12 DIAGNOSIS — I5022 Chronic systolic (congestive) heart failure: Secondary | ICD-10-CM | POA: Diagnosis not present

## 2019-02-12 DIAGNOSIS — Z95810 Presence of automatic (implantable) cardiac defibrillator: Secondary | ICD-10-CM

## 2019-02-14 ENCOUNTER — Other Ambulatory Visit: Payer: Self-pay

## 2019-02-14 MED ORDER — POTASSIUM CHLORIDE CRYS ER 20 MEQ PO TBCR: 20.0000 meq | EXTENDED_RELEASE_TABLET | Freq: Every day | ORAL | 2 refills | Status: DC

## 2019-02-15 NOTE — Progress Notes (Signed)
EPIC Encounter for ICM Monitoring  Patient Name: Emily [name] is a 68 y.o. female Date: 02/15/2019 Primary Care Physican: Kathrene Alu, MD Primary Cardiologist:Koneswaran Electrophysiologist:Taylor Bi-V Pacing:95.2% 12/05/2018 Weight: 128-130lbs  Time in AT/AF      24.0 hr/day (100.0%) Longest AT/AF      16 months    Spoke with patient and she asymptomatic.    OptivolThoracic impedancenormal.  Prescribed:Furosemide40 mgtake 1 tablettwice daily.Potassium 20 mEq 1 tablet every other day.  Labs: 08/09/2017 Creatinine3.00, BUN53, Potassium4.3, Sodium130, [id number]  08/08/2017 Creatinine3.36, BUN54, Potassium4.2, Sodium131, [id number]  08/07/2017 Creatinine3.45, BUN51, Potassium4.3, Sodium130, [id number]  08/06/2017 Creatinine3.29, BUN47, Potassium4.4, [id number], [id number] Creatinine range 2.04 to 3.18 in 2018 per Care Everywhere  Recommendations: No changes and encouraged to call if experiencing any fluid symptoms.  Follow-up plan: ICM clinic phone appointment on 03/25/2019.  91 day device clinic remote transmission 05/13/2019.  Office visit with Dr Bronson Ing on 02/20/2019.  Copy of ICM check sent to Dr. Lovena Le.   3 month ICM trend: 02/11/2019    1 Year ICM trend:       [name], RN 02/15/2019 1:52 PM

## 2019-02-20 ENCOUNTER — Telehealth (INDEPENDENT_AMBULATORY_CARE_PROVIDER_SITE_OTHER): Payer: PPO | Admitting: Cardiovascular Disease

## 2019-02-20 ENCOUNTER — Encounter: Payer: Self-pay | Admitting: Cardiovascular Disease

## 2019-02-20 VITALS — Ht 63.0 in | Wt 130.0 lb

## 2019-02-20 DIAGNOSIS — E785 Hyperlipidemia, unspecified: Secondary | ICD-10-CM

## 2019-02-20 DIAGNOSIS — I5022 Chronic systolic (congestive) heart failure: Secondary | ICD-10-CM

## 2019-02-20 DIAGNOSIS — Z95810 Presence of automatic (implantable) cardiac defibrillator: Secondary | ICD-10-CM

## 2019-02-20 DIAGNOSIS — I10 Essential (primary) hypertension: Secondary | ICD-10-CM

## 2019-02-20 DIAGNOSIS — I4819 Other persistent atrial fibrillation: Secondary | ICD-10-CM

## 2019-02-20 DIAGNOSIS — I420 Dilated cardiomyopathy: Secondary | ICD-10-CM
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
## 2019-02-20 NOTE — Patient Instructions (Signed)
Medication Instructions:  Your physician recommends that you continue on your current medications as directed. Please refer to the Current Medication list given to you today.  *If you need a refill on your cardiac medications before your next appointment, please call your pharmacy*  Lab Work: None today If you have labs (blood work) drawn today and your tests are completely normal, you will receive your results only by: Marland Kitchen MyChart Message (if you have MyChart) OR . A paper copy in the mail If you have any lab test that is abnormal or we need to change your treatment, we will call you to review the results.  Testing/Procedures: None today  Follow-Up: At [facility], you and your health needs are our priority.  As part of our continuing mission to provide you with exceptional heart care, we have created designated Provider Care Teams.  These Care Teams include your primary Cardiologist (physician) and Advanced Practice Providers (APPs -  Physician Assistants and Nurse Practitioners) who all work together to provide you with the care you need, when you need it.  Your next appointment:   4 month(s)  The format for your next appointment:   Virtual Visit   Provider:   Kate Sable, MD  Other Instructions None     Thank you for choosing [facility] !

## 2019-02-26 ENCOUNTER — Ambulatory Visit: Payer: PPO | Admitting: Family Medicine

## 2019-03-16 NOTE — Progress Notes (Signed)
ICD remote 

## 2019-03-21 ENCOUNTER — Ambulatory Visit (INDEPENDENT_AMBULATORY_CARE_PROVIDER_SITE_OTHER): Payer: PPO | Admitting: Family Medicine

## 2019-03-21 ENCOUNTER — Encounter: Payer: Self-pay | Admitting: Family Medicine

## 2019-03-21 ENCOUNTER — Other Ambulatory Visit: Payer: Self-pay

## 2019-03-21 ENCOUNTER — Ambulatory Visit (INDEPENDENT_AMBULATORY_CARE_PROVIDER_SITE_OTHER): Payer: PPO | Admitting: *Deleted

## 2019-03-21 VITALS — BP 124/62 | HR 84 | Wt 133.6 lb

## 2019-03-21 DIAGNOSIS — G44221 Chronic tension-type headache, intractable: Secondary | ICD-10-CM

## 2019-03-21 DIAGNOSIS — M25511 Pain in right shoulder: Secondary | ICD-10-CM | POA: Diagnosis not present

## 2019-03-21 DIAGNOSIS — N184 Chronic kidney disease, stage 4 (severe): Secondary | ICD-10-CM

## 2019-03-21 DIAGNOSIS — I10 Essential (primary) hypertension: Secondary | ICD-10-CM | POA: Diagnosis not present

## 2019-03-21 DIAGNOSIS — E1122 Type 2 diabetes mellitus with diabetic chronic kidney disease: Secondary | ICD-10-CM | POA: Diagnosis not present

## 2019-03-21 DIAGNOSIS — E039 Hypothyroidism, unspecified: Secondary | ICD-10-CM | POA: Diagnosis not present

## 2019-03-21 DIAGNOSIS — Z794 Long term (current) use of insulin: Secondary | ICD-10-CM | POA: Diagnosis not present

## 2019-03-21 DIAGNOSIS — G2581 Restless legs syndrome: Secondary | ICD-10-CM | POA: Diagnosis not present

## 2019-03-21 DIAGNOSIS — I4891 Unspecified atrial fibrillation: Secondary | ICD-10-CM | POA: Diagnosis not present

## 2019-03-21 DIAGNOSIS — I513 Intracardiac thrombosis, not elsewhere classified: Secondary | ICD-10-CM

## 2019-03-21 DIAGNOSIS — Z5181 Encounter for therapeutic drug level monitoring: Secondary | ICD-10-CM | POA: Diagnosis not present

## 2019-03-21 DIAGNOSIS — E7800 Pure hypercholesterolemia, unspecified: Secondary | ICD-10-CM | POA: Diagnosis not present

## 2019-03-21 LAB — POCT INR: INR: 3.2 — AB (ref 2.0–3.0)

## 2019-03-21 LAB — POCT GLYCOSYLATED HEMOGLOBIN (HGB A1C): HbA1c, POC (controlled diabetic range): 10 % — AB (ref 0.0–7.0)

## 2019-03-21 NOTE — Progress Notes (Addendum)
Subjective:    Emily [name] - 69 y.o. female MRN [id number]  Date of birth: 23-Jul-1950  CC:  [name] is here for follow up of diabetes and hypothyroidism.  HPI: Type 2 Diabetes: Meds: toujeo, which is expensive, patient does not want to increase dose due to expense CBGs: occasionally tests sugars, tend to be upper 100s to mid 200s Hypo/hyperglycemic symptoms: no hypoglycemic symptoms in the last three months, no hyperglycemic symptoms Diet: Admits to not being very careful with her diet recently and thinks that her diabetes is likely not well controlled  Headaches:  Headaches for the last 10 years, can feel her heartbeat in her head, has had numerous tests in the past with no answer.  Takes tylenol once at night.  Crying makes worse.  No vision changes, nausea.  Right shoulder pain: Started two weeks ago.  Hurts when reaching behind her or above her.  Right handed.  No injury or other inciting factors.  Has never occurred before.  Hypothyroidism:  Takes levothyroxine 50 mcg and 75 mcg alternating daily.  No energy changes or weight changes.  Grief reaction: Lost twin sister last thanksgiving and misses her greatly.  Cries almost every night but says that her mood is fine otherwise and that she enjoys life.  Restless leg: Has not slept well in many years due to restlessness at night.  Interested in ropinirole if can tolerate with CKD 4   Health Maintenance: Health Maintenance Due  Topic Date Due  . Hepatitis C Screening  October 18, 1950  . FOOT EXAM  09/07/1958  . TETANUS/TDAP  09/07/1967  . PNA vac Low Risk Adult (1 of 2 - PCV13) 09/06/2013  . URINE MICROALBUMIN  12/02/2018  . OPHTHALMOLOGY EXAM  03/15/2019    -  reports that she has never smoked. She has never used smokeless tobacco. - Review of Systems: Per HPI. - Past Medical History: Patient Active Problem List   Diagnosis Date Noted  . Chronic tension-type headache, intractable 03/22/2019  . Restless leg  syndrome 03/22/2019  . Acute pain of right shoulder 03/22/2019  . On amiodarone therapy 08/21/2017  . Atrial fibrillation with RVR ([place])   . Atrial fibrillation ([place]) 08/06/2017  . ICD (implantable cardioverter-defibrillator) in place 12/29/2016  . Anemia 12/29/2016  . IDDM (insulin dependent diabetes mellitus) 12/29/2016  . 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 Hypertension, well controlled 12/29/2016  . 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 Coronary artery disease involving native coronary artery of native heart without angina pectoris 08/28/2016  . CKD (chronic kidney disease) stage 4, GFR 15-29 ml/min (H[facility]) 08/26/2016  . Localized macular rash 08/11/2016  . Hypothyroidism 04/11/2016  . GERD (gastroesophageal reflux disease) 04/11/2016  . Gastroenteritis 04/10/2016  . Hypomagnesemia 04/10/2016  . Long-term insulin use in type 2 diabetes ([place]) 04/10/2016  . Hyponatremia 04/10/2016  . AKI (acute kidney injury) ([place]) 04/09/2016  . Persistent atrial fibrillation ([place]) 04/05/2016  . 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 Warfarin anticoagulation 04/21/2014  . ICD (implantable cardioverter-defibrillator) battery depletion 04/21/2014  . Colitis 09/11/2012  . Acute on chronic systolic congestive heart failure ([place]) 05/02/2012  . Acute renal failure ([place]) 04/15/2012  . Dilated cardiomyopathy ([place]) 05/24/2011  . HTN (hypertension), benign 05/24/2011  . Hypercholesterolemia 05/24/2011  . ICD (implantable cardioverter-defibrillator), biventricular, in situ 05/24/2011   - Medications: reviewed and updated   Objective:   Physical Exam BP 124/62   Pulse 84   Wt 133  lb 9.6 oz (60.6 kg)   SpO2 99%   BMI 23.67 kg/m  Gen: NAD, alert, cooperative with exam, well-appearing, pleasant CV: RRR, good S1/S2, no murmur, no edema Resp: CTABL, no wheezes, non-labored Extremities: Right shoulder - no visual abnormality.  Mild tenderness  to palpation of the biceps tendon.  Active range of motion inhibited by pain in the last 30 degrees of elevation but has appropriate passive ROM.  Hawkins test positive.  Empty can test positive.  Neurovascularly intact. Psych: good insight, alert and oriented, appropriate mood and affect        Assessment & Plan:   Hypothyroidism Will obtain TSH today.  Long-term insulin use in type 2 diabetes (Emily [name]) Hemoglobin A1c is 10.0 today, which is up from her previous measurement of 7.4 in October 2019.  Patient would like to work on improving her diet before making any changes to her insulin since her Nelva Nay is quite expensive and she does not want to go through it faster than she has been.  Unfortunately she cannot tolerate GLP-1 antagonists and her kidney function is too low for Metformin.  We will plan to see her back in 3 months and make adjustments if there is not significant improvement by then.    Hypertension, well controlled Will check BMP today.  Hypercholesterolemia Will check lipid profile today.  Chronic tension-type headache, intractable No especially concerning features, is not worsening over time, likely tension related.  Hopefully if we can improve her sleep, and this will improve as well.  Restless leg syndrome Will obtain BMP today and see if her kidney function can tolerate addition of ropinirole for her symptoms.  Acute pain of right shoulder Possible impingement versus rotator cuff injury.  Unlikely to be frozen shoulder due to good range of motion.  Patient was given shoulder mobility exercises and told to return to care if her shoulder pain and range of motion worsen.    Maia Breslow, M.D. 03/22/2019, 7:31 AM PGY-3, [place]

## 2019-03-21 NOTE — Patient Instructions (Addendum)
It was nice seeing you today Ms. Emily [name]!  Your A1c today was 10, which means that you need to work on your diet to get back down to a better level, preferably less than 8.  We will recheck it in 3 months.  We will check your thyroid and your kidney function today.  If your kidney function looks okay, we may add a medicine called ropinirole for your restless leg syndrome which can help with your sleep.  I am including shoulder exercises for you to try every day to prove your range of motion.  If your shoulder pain gets worse, please let me know.  If you have any questions or concerns, please feel free to call the clinic.   Be well,  Dr. Shan Levans  Shoulder Range of Motion Exercises Shoulder range of motion (ROM) exercises are done to keep the shoulder moving freely or to increase movement. They are often recommended for people who have shoulder pain or stiffness or who are recovering from a shoulder surgery. Phase 1 exercises When you are able, do this exercise 1-2 times per day for 30-60 seconds in each direction, or as directed by your health care provider. Pendulum exercise To do this exercise while sitting: 1. Sit in a chair or at the edge of your bed with your feet flat on the floor. 2. Let your affected arm hang down in front of you over the edge of the bed or chair. 3. Relax your shoulder, arm, and hand. [place] your body so your arm gently swings in small circles. You can also use your unaffected arm to start the motion. 5. Repeat changing the direction of the circles, swinging your arm left and right, and swinging your arm forward and back. To do this exercise while standing: 1. Stand next to a sturdy chair or table, and hold on to it with your hand on your unaffected side. 2. Bend forward at the waist. 3. Bend your knees slightly. 4. Relax your shoulder, arm, and hand. 5. While keeping your shoulder relaxed, use body motion to swing your arm in small circles. 6. Repeat changing  the direction of the circles, swinging your arm left and right, and swinging your arm forward and back. 7. Between exercises, stand up tall and take a short break to relax your lower back.  Phase 2 exercises Do these exercises 1-2 times per day or as told by your health care provider. Hold each stretch for 30 seconds, and repeat 3 times. Do the exercises with one or both arms as instructed by your health care provider. For these exercises, sit at a table with your hand and arm supported by the table. A chair that slides easily or has wheels can be helpful. External rotation 1. Turn your chair so that your affected side is nearest to the table. 2. Place your forearm on the table to your side. Bend your elbow about 90 at the elbow (right angle) and place your hand palm facing down on the table. Your elbow should be about 6 inches away from your side. 3. Keeping your arm on the table, lean your body forward. Abduction 1. Turn your chair so that your affected side is nearest to the table. 2. Place your forearm and hand on the table so that your thumb points toward the ceiling and your arm is straight out to your side. 3. Slide your hand out to the side and away from you, using your unaffected arm to do the work. 4.  To increase the stretch, you can slide your chair away from the table. Flexion: forward stretch 1. Sit facing the table. Place your hand and elbow on the table in front of you. 2. Slide your hand forward and away from you, using your unaffected arm to do the work. 3. To increase the stretch, you can slide your chair backward. Phase 3 exercises Do these exercises 1-2 times per day or as told by your health care provider. Hold each stretch for 30 seconds, and repeat 3 times. Do the exercises with one or both arms as instructed by your health care provider. Cross-body stretch: posterior capsule stretch 1. Lift your arm straight out in front of you. 2. Bend your arm 90 at the elbow (right  angle) so your forearm moves across your body. 3. Use your other arm to gently pull the elbow across your body, toward your other shoulder. Wall climbs 1. Stand with your affected arm extended out to the side with your hand resting on a door frame. 2. Slide your hand slowly up the door frame. 3. To increase the stretch, step through the door frame. Keep your body upright and do not lean. Wand exercises You will need a cane, a piece of PVC pipe, or a sturdy wooden dowel for wand exercises. Flexion To do this exercise while standing: 1. Hold the wand with both of your hands, palms down. 2. Using the other arm to help, lift your arms up and over your head, if able. 3. Push upward with your other arm to gently increase the stretch. To do this exercise while lying down: 1. Lie on your back with your elbows resting on the floor and the wand in both your hands. Your hands will be palm down, or pointing toward your feet. 2. Lift your hands toward the ceiling, using your unaffected arm to help if needed. 3. Bring your arms overhead as able, using your unaffected arm to help if needed. Internal rotation 1. Stand while holding the wand behind you with both hands. Your unaffected arm should be extended above your head with the arm of the affected side extended behind you at the level of your waist. The wand should be pointing straight up and down as you hold it. 2. Slowly pull the wand up behind your back by straightening the elbow of your unaffected arm and bending the elbow of your affected arm. External rotation 1. Lie on your back with your affected upper arm supported on a small pillow or rolled towel. When you first do this exercise, keep your upper arm close to your body. Over time, bring your arm up to a 90 angle out to the side. 2. Hold the wand across your stomach and with both hands palm up. Your elbow on your affected side should be bent at a 90 angle. 3. Use your unaffected side to help  push your forearm away from you and toward the floor. Keep your elbow on your affected side bent at a 90 angle. Contact a health care provider if you have:  New or increasing pain.  New numbness, tingling, weakness, or discoloration in your arm or hand. This information is not intended to replace advice given to you by your health care provider. Make sure you discuss any questions you have with your health care provider. Document Revised: 03/29/2017 Document Reviewed: 03/29/2017 Elsevier Patient Education  2020 [employer].

## 2019-03-21 NOTE — Patient Instructions (Signed)
Take warfarin 1 tablet tonight then resume 1 tablet daily except 2 tablets on Mondays and Thursdays Eat greens/salad tonight Recheck in 6 weeks

## 2019-03-22 DIAGNOSIS — G2581 Restless legs syndrome: Secondary | ICD-10-CM | POA: Insufficient documentation

## 2019-03-22 DIAGNOSIS — M25511 Pain in right shoulder: Secondary | ICD-10-CM

## 2019-03-22 DIAGNOSIS — G44221 Chronic tension-type headache, intractable: Secondary | ICD-10-CM | POA: Insufficient documentation

## 2019-03-22 HISTORY — DX: Restless legs syndrome: G25.81

## 2019-03-22 HISTORY — DX: Pain in right shoulder: M25.511

## 2019-03-22 LAB — COMPREHENSIVE METABOLIC PANEL
ALT: 9 IU/L (ref 0–32)
AST: 16 IU/L (ref 0–40)
Albumin/Globulin Ratio: 1.4 (ref 1.2–2.2)
Albumin: 4.7 g/dL (ref 3.8–4.8)
Alkaline Phosphatase: 90 IU/L (ref 39–117)
BUN/Creatinine Ratio: 13 (ref 12–28)
BUN: 29 mg/dL — ABNORMAL HIGH (ref 8–27)
Bilirubin Total: 1.6 mg/dL — ABNORMAL HIGH (ref 0.0–1.2)
CO2: 25 mmol/L (ref 20–29)
Calcium: 9.2 mg/dL (ref 8.7–10.3)
Chloride: 96 mmol/L (ref 96–106)
Creatinine, Ser: 2.2 mg/dL — ABNORMAL HIGH (ref 0.57–1.00)
GFR calc Af Amer: 25 mL/min/{1.73_m2} — ABNORMAL LOW (ref >59)
GFR calc non Af Amer: 22 mL/min/{1.73_m2} — ABNORMAL LOW (ref >59)
Globulin, Total: 3.4 g/dL (ref 1.5–4.5)
Glucose: 179 mg/dL — ABNORMAL HIGH (ref 65–99)
Potassium: 4.9 mmol/L (ref 3.5–5.2)
Sodium: 138 mmol/L (ref 134–144)
Total Protein: 8.1 g/dL (ref 6.0–8.5)

## 2019-03-22 LAB — CBC
Hematocrit: 42.5 % (ref 34.0–46.6)
Hemoglobin: 14.2 g/dL (ref 11.1–15.9)
MCH: 31.9 pg (ref 26.6–33.0)
MCHC: 33.4 g/dL (ref 31.5–35.7)
MCV: 96 fL (ref 79–97)
Platelets: 252 10*3/uL (ref 150–450)
RBC: 4.45 x10E6/uL (ref 3.77–5.28)
RDW: 12.7 % (ref 11.7–15.4)
WBC: 9.6 10*3/uL (ref 3.4–10.8)

## 2019-03-22 LAB — LIPID PANEL
Chol/HDL Ratio: 4.4 ratio (ref 0.0–4.4)
Cholesterol, Total: 197 mg/dL (ref 100–199)
HDL: 45 mg/dL (ref >39)
LDL Chol Calc (NIH): 118 mg/dL — ABNORMAL HIGH (ref 0–99)
Triglycerides: 193 mg/dL — ABNORMAL HIGH (ref 0–149)
VLDL Cholesterol Cal: 34 mg/dL (ref 5–40)

## 2019-03-22 LAB — TSH: TSH: 2.14 u[IU]/mL (ref 0.450–4.500)

## 2019-03-22 NOTE — Assessment & Plan Note (Signed)
Possible impingement versus rotator cuff injury.  Unlikely to be frozen shoulder due to good range of motion.  Patient was given shoulder mobility exercises and told to return to care if her shoulder pain and range of motion worsen.

## 2019-03-22 NOTE — Assessment & Plan Note (Signed)
Will check lipid profile today.  

## 2019-03-22 NOTE — Assessment & Plan Note (Signed)
Will check BMP today.

## 2019-03-22 NOTE — Assessment & Plan Note (Signed)
No especially concerning features, is not worsening over time, likely tension related.  Hopefully if we can improve her sleep, and this will improve as well.

## 2019-03-22 NOTE — Assessment & Plan Note (Signed)
Will obtain BMP today and see if her kidney function can tolerate addition of ropinirole for her symptoms.

## 2019-03-22 NOTE — Assessment & Plan Note (Signed)
Will obtain TSH today.

## 2019-03-22 NOTE — Assessment & Plan Note (Signed)
Hemoglobin A1c is 10.0 today, which is up from her previous measurement of 7.4 in October 2019.  Patient would like to work on improving her diet before making any changes to her insulin since her Nelva Nay is quite expensive and she does not want to go through it faster than she has been.  Unfortunately she cannot tolerate GLP-1 antagonists and her kidney function is too low for Metformin.  We will plan to see her back in 3 months and make adjustments if there is not significant improvement by then.

## 2019-03-22 NOTE — Assessment & Plan Note (Deleted)
Hemoglobin A1c is 10.0 today, which is up from her previous measurement of 7.4 in October 2019.  Patient would like to work on improving her diet before making any changes to her insulin since her Emily [name] is quite expensive and she does not want to go through it faster than she has been.  Unfortunately she cannot tolerate GLP-1 antagonists and her kidney function is too low for Metformin.  We will plan to see her back in 3 months and make adjustments if there is not significant improvement by then.

## 2019-03-25 ENCOUNTER — Ambulatory Visit (INDEPENDENT_AMBULATORY_CARE_PROVIDER_SITE_OTHER): Payer: PPO

## 2019-03-25 DIAGNOSIS — I5022 Chronic systolic (congestive) heart failure: Secondary | ICD-10-CM | POA: Diagnosis not present

## 2019-03-25 DIAGNOSIS — Z95810 Presence of automatic (implantable) cardiac defibrillator: Secondary | ICD-10-CM | POA: Diagnosis not present

## 2019-03-26 ENCOUNTER — Telehealth: Payer: Self-pay | Admitting: Family Medicine

## 2019-03-26 NOTE — Telephone Encounter (Signed)
Discussed results with patient.  Bilirubin is likely elevated due to her Gilbert's disease.  Patient would like to work on her diet and recheck her cholesterol in a few months rather than starting a stronger statin.

## 2019-03-29 NOTE — Progress Notes (Signed)
EPIC Encounter for ICM Monitoring  Patient Name: Emily [name] is a 69 y.o. female Date: 03/29/2019 Primary Care Physican: Kathrene Alu, MD Primary Cardiologist:Koneswaran Electrophysiologist:Taylor Bi-V Pacing:94.3% 03/29/2019 Weight: 128-130lbs  SVT: VT/VF Rx Withheld  1  Time in AT/AF  24.0 hr/day (100.0%) Longest AT/AF  17 months    Spoke with patientand she asymptomatic.   OptivolThoracic impedancenormal.  Prescribed:  Furosemide80 mgtake 1 tablettwice daily.  Potassium 20 mEq 1 tablet by mouth daily.  Labs: 03/21/2019 Creatinine 2.20, BUN 29, Potassium 4.9, Sodium 138, GFR 22-25 A complete set of results can be found in Results Review.  Recommendations: No changes and encouraged to call if experiencing any fluid symptoms.  Follow-up plan: ICM clinic phone appointment on3/03/2019. 91 day device clinic remote transmission 05/13/2019.   Office appt 06/27/2019 with Dr. Bronson Ing.    Copy of ICM check sent to Dr. Lovena Le.   3 month ICM trend: 03/25/2019    1 Year ICM trend:       [name], RN 03/29/2019 8:27 AM

## 2019-04-29 ENCOUNTER — Ambulatory Visit (INDEPENDENT_AMBULATORY_CARE_PROVIDER_SITE_OTHER): Payer: PPO

## 2019-04-29 ENCOUNTER — Other Ambulatory Visit: Payer: Self-pay

## 2019-04-29 ENCOUNTER — Ambulatory Visit: Payer: PPO | Admitting: Podiatry

## 2019-04-29 DIAGNOSIS — M779 Enthesopathy, unspecified: Secondary | ICD-10-CM

## 2019-04-29 DIAGNOSIS — M79672 Pain in left foot: Secondary | ICD-10-CM | POA: Diagnosis not present

## 2019-04-30 ENCOUNTER — Telehealth: Payer: Self-pay

## 2019-04-30 NOTE — Telephone Encounter (Signed)
Left message for patient to remind of missed remote transmission.  

## 2019-05-03 ENCOUNTER — Other Ambulatory Visit: Payer: Self-pay | Admitting: Podiatry

## 2019-05-03 DIAGNOSIS — M779 Enthesopathy, unspecified: Secondary | ICD-10-CM

## 2019-05-06 NOTE — Progress Notes (Signed)
Subjective: 69 year old female presents the office today for concerns of pain along left foot with some occasional swelling.  She gets pain 5/6 out of 10 she states that exercise makes it worse.  No recent injury.  She is diabetic and last A1c was 10. Denies any systemic complaints such as fevers, chills, nausea, vomiting. No acute changes since last appointment, and no other complaints at this time.   Objective: AAO x3, NAD DP/PT pulses palpable bilaterally, CRT less than 3 seconds There is tenderness to lateral aspect of foot along the fifth metatarsal base Insertional peroneal tendon.  There is minimal swelling to the area.  There is no erythema or warmth.  Overall the peroneal tendon appears to be intact.  Minimal discomfort of the medial aspect ankle as well on the course of the flexor tendons but overall the tendons appear to be intact.  There is no significant edema in this area.  There is no erythema or warmth.  Ankle range of motion intact.  Subtalar joint range of motion intact. No open lesions or pre-ulcerative lesions.  No pain with calf compression, swelling, warmth, erythema  Assessment: Tendinitis left foot  Plan: -All treatment options discussed with the patient including all alternatives, risks, complications.  -X-rays obtained reviewed.  Small calcifications present with metatarsal base likely this is more chronic.  No evidence of acute fracture. -Due to Coumadin use.  Hold off on anti-inflammatories.  Recommended ankle brace for now.  Discussed more supportive shoes.  She also has more of a cavus foot type and ultimately likely needs an orthotic to help take the pressure off of the lateral aspect of the foot. -Discussed steroid injection today. -Patient encouraged to call the office with any questions, concerns, change in symptoms.   Trula Slade DPM

## 2019-05-07 NOTE — Progress Notes (Signed)
No ICM remote transmission received for 04/29/2019 and next ICM transmission scheduled for 05/14/2019.

## 2019-05-08 ENCOUNTER — Other Ambulatory Visit: Payer: Self-pay

## 2019-05-08 ENCOUNTER — Ambulatory Visit (INDEPENDENT_AMBULATORY_CARE_PROVIDER_SITE_OTHER): Payer: PPO | Admitting: *Deleted

## 2019-05-08 DIAGNOSIS — I4891 Unspecified atrial fibrillation: Secondary | ICD-10-CM

## 2019-05-08 DIAGNOSIS — I513 Intracardiac thrombosis, not elsewhere classified: Secondary | ICD-10-CM

## 2019-05-08 DIAGNOSIS — Z5181 Encounter for therapeutic drug level monitoring: Secondary | ICD-10-CM | POA: Diagnosis not present

## 2019-05-08 LAB — POCT INR: INR: 2.6 (ref 2.0–3.0)

## 2019-05-08 NOTE — Patient Instructions (Signed)
Continue warfarin 1 tablet daily except 2 tablets on Mondays and Thursdays Eat greens/salad tonight Recheck in 6 weeks

## 2019-05-13 ENCOUNTER — Ambulatory Visit (INDEPENDENT_AMBULATORY_CARE_PROVIDER_SITE_OTHER): Payer: PPO | Admitting: *Deleted

## 2019-05-13 DIAGNOSIS — Z95810 Presence of automatic (implantable) cardiac defibrillator: Secondary | ICD-10-CM

## 2019-05-13 LAB — CUP PACEART REMOTE DEVICE CHECK
Battery Remaining Longevity: 22 mo
Battery Voltage: 2.92 V
Brady Statistic AP VP Percent: 0.02 %
Brady Statistic AP VS Percent: 0 %
Brady Statistic AS VP Percent: 91.46 %
Brady Statistic AS VS Percent: 8.51 %
Brady Statistic RA Percent Paced: 0.02 %
Brady Statistic RV Percent Paced: 92 %
Date Time Interrogation Session: 20210315022602
HighPow Impedance: 69 Ohm
Implantable Lead Implant Date: 20050218
Implantable Lead Implant Date: 20050218
Implantable Lead Implant Date: 20050218
Implantable Lead Location: 753858
Implantable Lead Location: 753859
Implantable Lead Location: 753860
Implantable Lead Model: 4194
Implantable Lead Model: 5076
Implantable Lead Model: 6947
Implantable Pulse Generator Implant Date: 20160413
Lead Channel Impedance Value: 399 Ohm
Lead Channel Impedance Value: 418 Ohm
Lead Channel Impedance Value: 456 Ohm
Lead Channel Impedance Value: 589 Ohm
Lead Channel Impedance Value: 665 Ohm
Lead Channel Impedance Value: 988 Ohm
Lead Channel Pacing Threshold Amplitude: 0.75 V
Lead Channel Pacing Threshold Amplitude: 0.875 V
Lead Channel Pacing Threshold Amplitude: 1.25 V
Lead Channel Pacing Threshold Pulse Width: 0.4 ms
Lead Channel Pacing Threshold Pulse Width: 0.4 ms
Lead Channel Pacing Threshold Pulse Width: 0.4 ms
Lead Channel Sensing Intrinsic Amplitude: 0.375 mV
Lead Channel Sensing Intrinsic Amplitude: 0.375 mV
Lead Channel Sensing Intrinsic Amplitude: 3 mV
Lead Channel Sensing Intrinsic Amplitude: 3 mV
Lead Channel Setting Pacing Amplitude: 2 V
Lead Channel Setting Pacing Amplitude: 2.25 V
Lead Channel Setting Pacing Amplitude: 3 V
Lead Channel Setting Pacing Pulse Width: 0.4 ms
Lead Channel Setting Pacing Pulse Width: 0.4 ms
Lead Channel Setting Sensing Sensitivity: 0.3 mV

## 2019-05-14 ENCOUNTER — Telehealth: Payer: Self-pay | Admitting: *Deleted

## 2019-05-14 ENCOUNTER — Ambulatory Visit (INDEPENDENT_AMBULATORY_CARE_PROVIDER_SITE_OTHER): Payer: PPO

## 2019-05-14 DIAGNOSIS — I5022 Chronic systolic (congestive) heart failure: Secondary | ICD-10-CM | POA: Diagnosis not present

## 2019-05-14 DIAGNOSIS — Z95810 Presence of automatic (implantable) cardiac defibrillator: Secondary | ICD-10-CM

## 2019-05-14 NOTE — Telephone Encounter (Signed)
I spoke with pt and she states she has a knot at the area of her tendonitis and would like to know when it would go down, it doesn't hurt. I told pt that if the area is inflamed it may stay swollen, ice 3-4 times a day for 15 minutes/session protect the skin from the ice with a light cloth. Pt states the brace makes the pain worse and has no pain without the brace. I told pt that she should be evaluated about 4 weeks from her last appt and she has an appt 05/20/2019. I told pt to bring the brace to the appt to make certain of proper fit.

## 2019-05-14 NOTE — Telephone Encounter (Signed)
Pt called for information concerning left foot tendonitis.

## 2019-05-14 NOTE — Progress Notes (Signed)
ICD Remote  

## 2019-05-14 NOTE — Telephone Encounter (Signed)
Thanks. Also just wanted to make sure there was no recent injury to the area that caused the knot to form or any recent injury that would have caused the tendon to tear.

## 2019-05-15 NOTE — Telephone Encounter (Signed)
I spoke with pt and she states she has not had any new injuries.

## 2019-05-17 NOTE — Progress Notes (Signed)
EPIC Encounter for ICM Monitoring  Patient Name: Emily [name] is a 69 y.o. female Date: 05/17/2019 Primary Care Physican: Kathrene Alu, MD Primary Cardiologist:Koneswaran Electrophysiologist:Taylor Bi-V Pacing:92% 05/17/2019 Weight: 130lbs    Time in AT/AF                24.0 hr/day (100.0%) Longest AT/AF               19 months    Spoke with patientand she asymptomatic for fluid accumulation. She is not comfortable taking COVID vaccine at this time but her husband it taking it.    OptivolThoracic impedancenormal.  Prescribed:  Furosemide80 mgtake 1 tablettwice daily.  Potassium 20 mEq 1 tablet by mouth daily.  Labs: 03/21/2019 Creatinine 2.20, BUN 29, Potassium 4.9, Sodium 138, GFR 22-25 A complete set of results can be found in Results Review.  Recommendations: No changes and encouraged to call if experiencing any fluid symptoms.  Follow-up plan: ICM clinic phone appointment on4/19/2021. 91 day device clinic remote transmission6/14/2021.   Office appt 06/27/2019 with Dr. Bronson Ing.    Copy of ICM check sent to Dr. Lovena Le.   3 month ICM trend: 05/13/2019    1 Year ICM trend:       [name], RN 05/17/2019 2:30 PM

## 2019-05-20 ENCOUNTER — Ambulatory Visit: Payer: PPO | Admitting: Podiatry

## 2019-05-20 ENCOUNTER — Other Ambulatory Visit: Payer: Self-pay

## 2019-05-20 ENCOUNTER — Encounter: Payer: Self-pay | Admitting: Podiatry

## 2019-05-20 VITALS — Temp 98.1°F

## 2019-05-20 DIAGNOSIS — M779 Enthesopathy, unspecified: Secondary | ICD-10-CM

## 2019-05-20 DIAGNOSIS — M79672 Pain in left foot: Secondary | ICD-10-CM | POA: Diagnosis not present

## 2019-05-26 NOTE — Progress Notes (Signed)
Subjective: 69 year old female presents the office today for concerns of left foot pain follow-up.  She states that overall she is doing better and her pain is much improved.  She still states that she has a "knot" on the side of her foot pointing fifth metatarsal base but no significant pain today.  Still some minimal edema but no redness or warmth. Denies any systemic complaints such as fevers, chills, nausea, vomiting. No acute changes since last appointment, and no other complaints at this time.   Objective: AAO x3, NAD DP/PT pulses palpable bilaterally, CRT less than 3 seconds Prominence of the fifth metatarsal base.  Slight tenderness palpation but there is no pain on the course the peroneal tendon.  There is trace edema but there is no erythema or warmth.  Flexor, extensor tendons appear to be intact. No open lesions or pre-ulcerative lesions.  No pain with calf compression, swelling, warmth, erythema  Assessment: Fifth metatarsal base pain  Plan: -All treatment options discussed with the patient including all alternatives, risks, complications.  -Overall she is doing well no significant discomfort.  Continue with supportive shoes and we discussed offloading pads are dispensed.  Wants to hold off on steroid injection. -Patient encouraged to call the office with any questions, concerns, change in symptoms.   Trula Slade DPM

## 2019-06-10 ENCOUNTER — Other Ambulatory Visit: Payer: Self-pay | Admitting: Family Medicine

## 2019-06-17 ENCOUNTER — Ambulatory Visit (INDEPENDENT_AMBULATORY_CARE_PROVIDER_SITE_OTHER): Payer: PPO

## 2019-06-17 DIAGNOSIS — I5022 Chronic systolic (congestive) heart failure: Secondary | ICD-10-CM | POA: Diagnosis not present

## 2019-06-17 DIAGNOSIS — Z95810 Presence of automatic (implantable) cardiac defibrillator: Secondary | ICD-10-CM

## 2019-06-19 ENCOUNTER — Other Ambulatory Visit: Payer: Self-pay

## 2019-06-19 ENCOUNTER — Ambulatory Visit (INDEPENDENT_AMBULATORY_CARE_PROVIDER_SITE_OTHER): Payer: PPO | Admitting: *Deleted

## 2019-06-19 DIAGNOSIS — I4891 Unspecified atrial fibrillation: Secondary | ICD-10-CM

## 2019-06-19 DIAGNOSIS — I513 Intracardiac thrombosis, not elsewhere classified: Secondary | ICD-10-CM

## 2019-06-19 DIAGNOSIS — Z5181 Encounter for therapeutic drug level monitoring: Secondary | ICD-10-CM | POA: Diagnosis not present

## 2019-06-19 LAB — POCT INR: INR: 2.2 (ref 2.0–3.0)

## 2019-06-19 NOTE — Progress Notes (Signed)
EPIC Encounter for ICM Monitoring  Patient Name: Emily [name] is a 69 y.o. female Date: 06/19/2019 Primary Care Physican: Kathrene Alu, MD Primary Cardiologist:Koneswaran Electrophysiologist:Taylor Bi-V Pacing:93.3% 4/21/2021Weight: 130lbs    Time in AT/AF     24.0 hr/day (100.0%) Longest AT/AF 20 months    Spoke with patientand she asymptomatic for fluid accumulation.   OptivolThoracic impedancenormal.  Prescribed:  Furosemide80 mgtake 1 tablettwice daily.  Potassium 20 mEq 1 tabletby mouth daily.  Labs: 03/21/2019 Creatinine2.20[name], Potassium4.9, [id number], [id number] A complete set of results can be found in Results Review.  Recommendations: No changes and encouraged to call if experiencing any fluid symptoms.  Follow-up plan: ICM clinic phone appointment on5/24/2021. 91 day device clinic remote transmission6/14/2021.Office appt5/21/2021 with Dr.Koneswaran.   Copy of ICM check sent to Dr.Taylor.   3 month ICM trend: 06/17/2019    1 Year ICM trend:       [name], RN 06/19/2019 2:32 PM

## 2019-06-19 NOTE — Patient Instructions (Signed)
Continue warfarin 1 tablet daily except 2 tablets on Mondays and Thursdays Eat greens/salad tonight Recheck in 6 weeks

## 2019-06-27 ENCOUNTER — Telehealth: Payer: PPO | Admitting: Cardiovascular Disease

## 2019-06-28 ENCOUNTER — Other Ambulatory Visit: Payer: Self-pay | Admitting: Internal Medicine

## 2019-07-01 ENCOUNTER — Ambulatory Visit: Payer: PPO | Admitting: Podiatry

## 2019-07-19 ENCOUNTER — Telehealth: Payer: Self-pay | Admitting: *Deleted

## 2019-07-19 ENCOUNTER — Other Ambulatory Visit: Payer: Self-pay

## 2019-07-19 ENCOUNTER — Ambulatory Visit (INDEPENDENT_AMBULATORY_CARE_PROVIDER_SITE_OTHER): Payer: PPO | Admitting: Cardiovascular Disease

## 2019-07-19 ENCOUNTER — Encounter: Payer: Self-pay | Admitting: Cardiovascular Disease

## 2019-07-19 VITALS — BP 124/68 | HR 70 | Ht 63.0 in | Wt 134.2 lb

## 2019-07-19 DIAGNOSIS — I5022 Chronic systolic (congestive) heart failure: Secondary | ICD-10-CM | POA: Diagnosis not present

## 2019-07-19 DIAGNOSIS — I10 Essential (primary) hypertension: Secondary | ICD-10-CM

## 2019-07-19 DIAGNOSIS — N184 Chronic kidney disease, stage 4 (severe): Secondary | ICD-10-CM

## 2019-07-19 DIAGNOSIS — E785 Hyperlipidemia, unspecified: Secondary | ICD-10-CM | POA: Diagnosis not present

## 2019-07-19 DIAGNOSIS — Z95810 Presence of automatic (implantable) cardiac defibrillator: Secondary | ICD-10-CM

## 2019-07-19 DIAGNOSIS — I420 Dilated cardiomyopathy: Secondary | ICD-10-CM

## 2019-07-19 DIAGNOSIS — I4819 Other persistent atrial fibrillation: Secondary | ICD-10-CM

## 2019-07-19 MED ORDER — FUROSEMIDE 80 MG PO TABS: 80.0000 mg | ORAL_TABLET | Freq: Two times a day (BID) | ORAL | Status: DC

## 2019-07-19 MED ORDER — FUROSEMIDE 40 MG PO TABS: 40.0000 mg | ORAL_TABLET | Freq: Two times a day (BID) | ORAL | Status: DC

## 2019-07-19 NOTE — Telephone Encounter (Signed)
Called to clarify furosemide dose. Says her dose is 80 mg and she has been taking it twice daily and request to stay on the same dose

## 2019-07-19 NOTE — Telephone Encounter (Signed)
That is fine.  I updated her chart with the correct dosage.

## 2019-07-19 NOTE — Patient Instructions (Addendum)
Medication Instructions:  Continue all current medications.  Labwork: none  Testing/Procedures: none  Follow-Up: 4 months   Any Other Special Instructions Will Be Listed Below (If Applicable).  If you need a refill on your cardiac medications before your next appointment, please call your pharmacy.\ 

## 2019-07-19 NOTE — Telephone Encounter (Signed)
Medication list updated as well.
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
## 2019-07-22 ENCOUNTER — Ambulatory Visit (INDEPENDENT_AMBULATORY_CARE_PROVIDER_SITE_OTHER): Payer: PPO

## 2019-07-22 DIAGNOSIS — Z95810 Presence of automatic (implantable) cardiac defibrillator: Secondary | ICD-10-CM

## 2019-07-22 DIAGNOSIS — I5022 Chronic systolic (congestive) heart failure: Secondary | ICD-10-CM

## 2019-07-23 ENCOUNTER — Telehealth: Payer: Self-pay

## 2019-07-23 NOTE — Telephone Encounter (Signed)
Left message for patient to remind of missed remote transmission.  

## 2019-07-24 NOTE — Progress Notes (Signed)
EPIC Encounter for ICM Monitoring  Patient Name: Emily [name] is a 69 y.o. female Date: 07/24/2019 Primary Care Physican: Kathrene Alu, MD Primary Cardiologist:Koneswaran Electrophysiologist:Taylor Bi-V Pacing:95.8% 5/26/2021Weight: 130lbs   Time in AT/AF 24.0 hr/day (100.0%) Longest AT/AF 21 months    Spoke with patientand she asymptomaticfor fluid accumulation.   OptivolThoracic impedancenormal.  Prescribed:  Furosemide80 mgtake 1 tablettwice daily.  Potassium 20 mEq 1 tabletby mouth daily.  Labs: 03/21/2019 Creatinine2.20Nicky [name], Potassium4.9, [id number], [id number] A complete set of results can be found in Results Review.  Recommendations: No changes and encouraged to call if experiencing any fluid symptoms.  Follow-up plan: ICM clinic phone appointment on6/28/2021. 91 day device clinic remote transmission6/14/2021.  Copy of ICM check sent to Dr.Taylor.  3 month ICM trend: 07/23/2019    1 Year ICM trend:       Emily Billings, RN 07/24/2019 10:52 AM

## 2019-08-01 ENCOUNTER — Ambulatory Visit (INDEPENDENT_AMBULATORY_CARE_PROVIDER_SITE_OTHER): Payer: PPO | Admitting: *Deleted

## 2019-08-01 ENCOUNTER — Other Ambulatory Visit: Payer: Self-pay

## 2019-08-01 DIAGNOSIS — I513 Intracardiac thrombosis, not elsewhere classified: Secondary | ICD-10-CM

## 2019-08-01 DIAGNOSIS — I4891 Unspecified atrial fibrillation: Secondary | ICD-10-CM

## 2019-08-01 DIAGNOSIS — Z5181 Encounter for therapeutic drug level monitoring: Secondary | ICD-10-CM

## 2019-08-01 LAB — POCT INR: INR: 2.7 (ref 2.0–3.0)

## 2019-08-01 NOTE — Patient Instructions (Signed)
Continue warfarin 1 tablet daily except 2 tablets on Mondays and Thursdays Eat greens/salad tonight Recheck in 6 weeks

## 2019-08-05 ENCOUNTER — Other Ambulatory Visit: Payer: Self-pay | Admitting: Cardiovascular Disease

## 2019-08-09 ENCOUNTER — Other Ambulatory Visit: Payer: Self-pay | Admitting: Cardiovascular Disease

## 2019-08-12 ENCOUNTER — Ambulatory Visit (INDEPENDENT_AMBULATORY_CARE_PROVIDER_SITE_OTHER): Payer: PPO | Admitting: *Deleted

## 2019-08-12 DIAGNOSIS — I420 Dilated cardiomyopathy: Secondary | ICD-10-CM | POA: Diagnosis not present

## 2019-08-13 LAB — CUP PACEART REMOTE DEVICE CHECK
Battery Remaining Longevity: 20 mo
Battery Voltage: 2.92 V
Brady Statistic AP VP Percent: 0.02 %
Brady Statistic AP VS Percent: 0 %
Brady Statistic AS VP Percent: 95.08 %
Brady Statistic AS VS Percent: 4.91 %
Brady Statistic RA Percent Paced: 0.01 %
Brady Statistic RV Percent Paced: 95.32 %
Date Time Interrogation Session: 20210614012503
HighPow Impedance: 77 Ohm
Implantable Lead Implant Date: 20050218
Implantable Lead Implant Date: 20050218
Implantable Lead Implant Date: 20050218
Implantable Lead Location: 753858
Implantable Lead Location: 753859
Implantable Lead Location: 753860
Implantable Lead Model: 4194
Implantable Lead Model: 5076
Implantable Lead Model: 6947
Implantable Pulse Generator Implant Date: 20160413
Lead Channel Impedance Value: 1083 Ohm
Lead Channel Impedance Value: 418 Ohm
Lead Channel Impedance Value: 418 Ohm
Lead Channel Impedance Value: 513 Ohm
Lead Channel Impedance Value: 646 Ohm
Lead Channel Impedance Value: 703 Ohm
Lead Channel Pacing Threshold Amplitude: 0.75 V
Lead Channel Pacing Threshold Amplitude: 0.875 V
Lead Channel Pacing Threshold Amplitude: 1.25 V
Lead Channel Pacing Threshold Pulse Width: 0.4 ms
Lead Channel Pacing Threshold Pulse Width: 0.4 ms
Lead Channel Pacing Threshold Pulse Width: 0.4 ms
Lead Channel Sensing Intrinsic Amplitude: 0.5 mV
Lead Channel Sensing Intrinsic Amplitude: 0.5 mV
Lead Channel Sensing Intrinsic Amplitude: 8.375 mV
Lead Channel Sensing Intrinsic Amplitude: 8.375 mV
Lead Channel Setting Pacing Amplitude: 2 V
Lead Channel Setting Pacing Amplitude: 2.25 V
Lead Channel Setting Pacing Amplitude: 2.5 V
Lead Channel Setting Pacing Pulse Width: 0.4 ms
Lead Channel Setting Pacing Pulse Width: 0.4 ms
Lead Channel Setting Sensing Sensitivity: 0.3 mV

## 2019-08-13 NOTE — Progress Notes (Signed)
Remote ICD transmission.   

## 2019-08-21 ENCOUNTER — Ambulatory Visit (INDEPENDENT_AMBULATORY_CARE_PROVIDER_SITE_OTHER): Payer: PPO

## 2019-08-21 ENCOUNTER — Other Ambulatory Visit: Payer: Self-pay | Admitting: Podiatry

## 2019-08-21 ENCOUNTER — Ambulatory Visit: Payer: PPO | Admitting: Podiatry

## 2019-08-21 ENCOUNTER — Other Ambulatory Visit: Payer: Self-pay

## 2019-08-21 DIAGNOSIS — M10072 Idiopathic gout, left ankle and foot: Secondary | ICD-10-CM

## 2019-08-21 DIAGNOSIS — M778 Other enthesopathies, not elsewhere classified: Secondary | ICD-10-CM | POA: Diagnosis not present

## 2019-08-21 DIAGNOSIS — M779 Enthesopathy, unspecified: Secondary | ICD-10-CM

## 2019-08-21 NOTE — Patient Instructions (Signed)
 Gout  Gout is painful swelling of your joints. Gout is a type of arthritis. It is caused by having too much uric acid in your body. Uric acid is a chemical that is made when your body breaks down substances called purines. If your body has too much uric acid, sharp crystals can form and build up in your joints. This causes pain and swelling. Gout attacks can happen quickly and be very painful (acute gout). Over time, the attacks can affect more joints and happen more often (chronic gout). What are the causes?  Too much uric acid in your blood. This can happen because: ? Your kidneys do not remove enough uric acid from your blood. ? Your body makes too much uric acid. ? You eat too many foods that are high in purines. These foods include organ meats, some seafood, and beer.  Trauma or stress. What increases the risk?  Having a family history of gout.  Being female and middle-aged.  Being female and having gone through menopause.  Being very overweight (obese).  Drinking alcohol, especially beer.  Not having enough water in the body (being dehydrated).  Losing weight too quickly.  Having an organ transplant.  Having lead poisoning.  Taking certain medicines.  Having kidney disease.  Having a skin condition called psoriasis. What are the signs or symptoms? An attack of acute gout usually happens in just one joint. The most common place is the big toe. Attacks often start at night. Other joints that may be affected include joints of the feet, ankle, knee, fingers, wrist, or elbow. Symptoms of an attack may include:  Very bad pain.  Warmth.  Swelling.  Stiffness.  Shiny, red, or purple skin.  Tenderness. The affected joint may be very painful to touch.  Chills and fever. Chronic gout may cause symptoms more often. More joints may be involved. You may also have white or yellow lumps (tophi) on your hands or feet or in other areas near your joints. How is this  treated?  Treatment for this condition has two phases: treating an acute attack and preventing future attacks.  Acute gout treatment may include: ? NSAIDs. ? Steroids. These are taken by mouth or injected into a joint. ? Colchicine. This medicine relieves pain and swelling. It can be given by mouth or through an IV tube.  Preventive treatment may include: ? Taking small doses of NSAIDs or colchicine daily. ? Using a medicine that reduces uric acid levels in your blood. ? Making changes to your diet. You may need to see a food expert (dietitian) about what to eat and drink to prevent gout. Follow these instructions at home: During a gout attack   If told, put ice on the painful area: ? Put ice in a plastic bag. ? Place a towel between your skin and the bag. ? Leave the ice on for 20 minutes, 2-3 times a day.  Raise (elevate) the painful joint above the level of your heart as often as you can.  Rest the joint as much as possible. If the joint is in your leg, you may be given crutches.  Follow instructions from your doctor about what you cannot eat or drink. Avoiding future gout attacks  Eat a low-purine diet. Avoid foods and drinks such as: ? Liver. ? Kidney. ? Anchovies. ? Asparagus. ? Herring. ? Mushrooms. ? Mussels. ? Beer.  Stay at a healthy weight. If you want to lose weight, talk with your doctor. Do not lose   weight too fast.  Start or continue an exercise plan as told by your doctor. Eating and drinking  Drink enough fluids to keep your pee (urine) pale yellow.  If you drink alcohol: ? Limit how much you use to:  0-1 drink a day for women.  0-2 drinks a day for men. ? Be aware of how much alcohol is in your drink. In the [place] one drink equals one 12 oz bottle of beer (355 mL), one 5 oz glass of wine (148 mL), or one 1 oz glass of hard liquor (44 mL). General instructions  Take over-the-counter and prescription medicines only as told by your doctor.  Do  not drive or use heavy machinery while taking prescription pain medicine.  Return to your normal activities as told by your doctor. Ask your doctor what activities are safe for you.  Keep all follow-up visits as told by your doctor. This is important. Contact a doctor if:  You have another gout attack.  You still have symptoms of a gout attack after 10 days of treatment.  You have problems (side effects) because of your medicines.  You have chills or a fever.  You have burning pain when you pee (urinate).  You have pain in your lower back or belly. Get help right away if:  You have very bad pain.  Your pain cannot be controlled.  You cannot pee. Summary  Gout is painful swelling of the joints.  The most common site of pain is the big toe, but it can affect other joints.  Medicines and avoiding some foods can help to prevent and treat gout attacks. This information is not intended to replace advice given to you by your health care provider. Make sure you discuss any questions you have with your health care provider. Document Revised: 09/06/2017 Document Reviewed: 09/06/2017 Elsevier Patient Education  2020 Elsevier Inc.  

## 2019-08-23 NOTE — Progress Notes (Signed)
Subjective: 69 year old female presents the office with concerns of pain to her left big toe joint.  Is been ongoing last 4 weeks.  She states that previously was swollen and red on all the toes but majority of pain is along the side of the foot pointing to the first MPJ.  No recent injury or falls.  She has noticed redness and swelling.  Is difficult to put pressure to the area.  Denies any open sores. Denies any systemic complaints such as fevers, chills, nausea, vomiting. No acute changes since last appointment, and no other complaints at this time.   Objective: AAO x3, NAD DP/PT pulses palpable bilaterally, CRT less than 3 seconds There is localized edema and erythema mostly to the medial aspect of first MPJ on the left side.  There is mild discomfort with MPJ range of motion.  No other areas of tenderness identified. No open lesions or pre-ulcerative lesions.  No pain with calf compression, swelling, warmth, erythema  Assessment: Capsulitis, gout left foot  Plan: -All treatment options discussed with the patient including all alternatives, risks, complications.  -X-rays were obtained reviewed.  There is no evidence of acute fracture identified.  No foreign body. -Steroid injection was performed.  Skin was cleaned with alcohol and a mixture of 1 cc Kenalog 10, 0.5 cc of Marcaine plain, 0.5 cc of lidocaine plain was infiltrated around the first MPJ on the area of maximal tenderness.  Postinjection care was discussed. -Discussed etiology of gout.  Discussed diet modifications as well.  Hydration. -Patient encouraged to call the office with any questions, concerns, change in symptoms.   Emily [name] DPM

## 2019-08-26 ENCOUNTER — Ambulatory Visit (INDEPENDENT_AMBULATORY_CARE_PROVIDER_SITE_OTHER): Payer: PPO

## 2019-08-26 DIAGNOSIS — I5022 Chronic systolic (congestive) heart failure: Secondary | ICD-10-CM | POA: Diagnosis not present

## 2019-08-26 DIAGNOSIS — Z95810 Presence of automatic (implantable) cardiac defibrillator: Secondary | ICD-10-CM

## 2019-08-28 NOTE — Progress Notes (Signed)
EPIC Encounter for ICM Monitoring  Patient Name: Emily [name] is a 69 y.o. female Date: 08/28/2019 Primary Care Physican: Kathrene Alu, MD Primary Cardiologist:Koneswaran Electrophysiologist:Taylor Bi-V Pacing:97.7% 6/30/2021Weight: 130.2lbs   Time in AT/AF 24.0 hr/day (100.0%) Longest AT/AF 22 months    Spoke with patient and reports feeling well at this time.  Denies fluid symptoms.     OptivolThoracic impedancenormal.  Prescribed:  Furosemide80 mgtake 1 tablettwice daily.  Potassium 20 mEq 1 tabletby mouth daily.  Labs: 03/21/2019 Creatinine2.20[name], Potassium4.9, [id number], [id number] A complete set of results can be found in Results Review.  Recommendations: No changes and encouraged to call if experiencing any fluid symptoms.  Follow-up plan: ICM clinic phone appointment on8/04/2019. 91 day device clinic remote transmission9/13/2021.  EP/Cardiology Office Visits: 11/20/2019 with Dr. Bronson Ing.    Copy of ICM check sent to Dr. 6.   3 month ICM trend: 08/26/2019    1 Year ICM trend:       [name], RN 08/28/2019 10:46 AM

## 2019-08-29 ENCOUNTER — Other Ambulatory Visit: Payer: Self-pay | Admitting: *Deleted

## 2019-08-29 ENCOUNTER — Telehealth: Payer: Self-pay | Admitting: Podiatry

## 2019-08-29 DIAGNOSIS — M109 Gout, unspecified: Secondary | ICD-10-CM

## 2019-08-29 NOTE — Telephone Encounter (Signed)
Dr. McDonald

## 2019-08-29 NOTE — Telephone Encounter (Signed)
Pt called and wanted to speak to assistant about toe please assist

## 2019-08-29 NOTE — Telephone Encounter (Signed)
Hey can you put patient in next week for an injection per Dr Jacqualyn Posey. Thanks Montey Ebel

## 2019-09-06 ENCOUNTER — Ambulatory Visit: Payer: PPO | Admitting: Podiatry

## 2019-09-12 ENCOUNTER — Ambulatory Visit (INDEPENDENT_AMBULATORY_CARE_PROVIDER_SITE_OTHER): Payer: PPO | Admitting: *Deleted

## 2019-09-12 DIAGNOSIS — I513 Intracardiac thrombosis, not elsewhere classified: Secondary | ICD-10-CM

## 2019-09-12 DIAGNOSIS — Z5181 Encounter for therapeutic drug level monitoring: Secondary | ICD-10-CM

## 2019-09-12 DIAGNOSIS — I4891 Unspecified atrial fibrillation: Secondary | ICD-10-CM

## 2019-09-12 LAB — POCT INR: INR: 2.4 (ref 2.0–3.0)

## 2019-09-12 NOTE — Patient Instructions (Signed)
Continue warfarin 1 tablet daily except 2 tablets on Mondays and Thursdays Eat greens/salad tonight Recheck in 6 weeks

## 2019-09-30 ENCOUNTER — Telehealth: Payer: Self-pay | Admitting: *Deleted

## 2019-09-30 ENCOUNTER — Ambulatory Visit (INDEPENDENT_AMBULATORY_CARE_PROVIDER_SITE_OTHER): Payer: PPO

## 2019-09-30 DIAGNOSIS — Z95810 Presence of automatic (implantable) cardiac defibrillator: Secondary | ICD-10-CM | POA: Diagnosis not present

## 2019-09-30 DIAGNOSIS — I5022 Chronic systolic (congestive) heart failure: Secondary | ICD-10-CM

## 2019-09-30 NOTE — Progress Notes (Signed)
EPIC Encounter for ICM Monitoring  Patient Name: Emily [name] is a 69 y.o. female Date: 09/30/2019 Primary Care Physican: Ezequiel Essex, MD Primary Cardiologist:Koneswaran Electrophysiologist:Taylor Bi-V Pacing:97.8% 6/30/2021Weight: 130.2lbs   Time in AT/AF 24.0 hr/day (100.0%) Longest AT/AF 25months    Attempted call to patient and unable to reach.  Transmission reviewed.    OptivolThoracic impedancesuggesting possible fluid accumulation since 09/23/2019.  Prescribed:  Furosemide80 mgtake 1 tablettwice daily.  Potassium 20 mEq 1 tabletby mouth daily.  Labs: 03/21/2019 Creatinine2.20[name], Potassium4.9, [id number], [id number] A complete set of results can be found in Results Review.  Recommendations:Unable to reach.    Follow-up plan: ICM clinic phone appointment on8/10/2019 to recheck fluid levels. 91 day device clinic remote transmission9/13/2021.  EP/Cardiology Office Visits: 11/21/2019 with Dr. Domenic Polite.    Copy of ICM check sent to Dr. Lovena Le.   3 month ICM trend: 09/30/2019    1 Year ICM trend:       [name], RN 09/30/2019 1:52 PM

## 2019-09-30 NOTE — Telephone Encounter (Signed)
Wants to know if she is okay from a cardiac perspective to take the covid vaccine - [employer].    Informed patient that all of our providers have recommended patients getting the covid vaccine, but will forward for provider advice.

## 2019-09-30 NOTE — Telephone Encounter (Signed)
Patient notified and verbalized understanding. 

## 2019-09-30 NOTE — Telephone Encounter (Signed)
No specific cardiac contraindication for her to get the COVID-19 vaccine.  Would generally suggest that she get the [place] vaccine.

## 2019-10-01 ENCOUNTER — Telehealth: Payer: Self-pay

## 2019-10-01 NOTE — Telephone Encounter (Signed)
Remote ICM transmission received.  Attempted call to patient regarding ICM remote transmission and no answer or answering machine. 

## 2019-10-07 ENCOUNTER — Ambulatory Visit (INDEPENDENT_AMBULATORY_CARE_PROVIDER_SITE_OTHER): Payer: PPO

## 2019-10-07 DIAGNOSIS — I5022 Chronic systolic (congestive) heart failure: Secondary | ICD-10-CM

## 2019-10-07 DIAGNOSIS — Z95810 Presence of automatic (implantable) cardiac defibrillator: Secondary | ICD-10-CM

## 2019-10-09 NOTE — Progress Notes (Signed)
EPIC Encounter for ICM Monitoring  Patient Name: Emily [name] is a 69 y.o. female Date: 10/09/2019 Primary Care Physican: Ezequiel Essex, MD Primary Cardiologist:Koneswaran Electrophysiologist:Taylor Bi-V Pacing:98.5% 6/30/2021Weight: 130.2lbs   Time in AT/AF 24.0 hr/day (100.0%) Longest AT/AF 42months    Transmission reviewed.    OptivolThoracic impedancesuggesting return close to normal fluid levels since 09/30/2019 remote transmission.  Prescribed:  Furosemide80 mgtake 1 tablettwice daily.  Potassium 20 mEq 1 tabletby mouth daily.  Labs: 03/21/2019 Creatinine2.20[name], Potassium4.9, [id number], [id number] A complete set of results can be found in Results Review.  Recommendations: No changes   Follow-up plan: ICM clinic phone appointment on9/14/2021. 91 day device clinic remote transmission9/13/2021.  EP/Cardiology Office Visits:11/21/2019 with Dr. Domenic Polite.   Copy of ICM check sent to Dr.Taylor.   3 month ICM trend: 10/07/2019    1 Year ICM trend:       [name], RN 10/09/2019 3:18 PM

## 2019-10-24 ENCOUNTER — Ambulatory Visit (INDEPENDENT_AMBULATORY_CARE_PROVIDER_SITE_OTHER): Payer: PPO | Admitting: *Deleted

## 2019-10-24 ENCOUNTER — Other Ambulatory Visit: Payer: Self-pay

## 2019-10-24 DIAGNOSIS — I4891 Unspecified atrial fibrillation: Secondary | ICD-10-CM

## 2019-10-24 DIAGNOSIS — I513 Intracardiac thrombosis, not elsewhere classified: Secondary | ICD-10-CM

## 2019-10-24 DIAGNOSIS — Z5181 Encounter for therapeutic drug level monitoring: Secondary | ICD-10-CM | POA: Diagnosis not present

## 2019-10-24 LAB — POCT INR: INR: 2.5 (ref 2.0–3.0)

## 2019-10-24 NOTE — Patient Instructions (Signed)
Continue warfarin 1 tablet daily except 2 tablets on Mondays and Thursdays Eat greens/salad tonight Recheck in 6 weeks

## 2019-10-25 ENCOUNTER — Telehealth: Payer: Self-pay | Admitting: Cardiology

## 2019-10-25 MED ORDER — METOPROLOL SUCCINATE ER 50 MG PO TB24: ORAL_TABLET | ORAL | 1 refills | Status: DC

## 2019-10-25 NOTE — Telephone Encounter (Signed)
Medication sent to pharmacy  

## 2019-10-25 NOTE — Telephone Encounter (Signed)
Needing new Rx's sent in for pt's metoprolol succinate (TOPROL-XL) 50 MG 24 hr tablet [[id number]]  W/ another provider name sent to [place]

## 2019-11-11 ENCOUNTER — Ambulatory Visit (INDEPENDENT_AMBULATORY_CARE_PROVIDER_SITE_OTHER): Payer: PPO | Admitting: *Deleted

## 2019-11-11 DIAGNOSIS — I4891 Unspecified atrial fibrillation: Secondary | ICD-10-CM | POA: Diagnosis not present

## 2019-11-12 ENCOUNTER — Other Ambulatory Visit: Payer: Self-pay

## 2019-11-12 ENCOUNTER — Encounter: Payer: Self-pay | Admitting: Internal Medicine

## 2019-11-12 ENCOUNTER — Ambulatory Visit (INDEPENDENT_AMBULATORY_CARE_PROVIDER_SITE_OTHER): Payer: PPO

## 2019-11-12 ENCOUNTER — Ambulatory Visit: Payer: PPO | Admitting: Internal Medicine

## 2019-11-12 VITALS — BP 122/68 | HR 80 | Ht 62.0 in | Wt 134.0 lb

## 2019-11-12 DIAGNOSIS — I5022 Chronic systolic (congestive) heart failure: Secondary | ICD-10-CM

## 2019-11-12 DIAGNOSIS — Z95810 Presence of automatic (implantable) cardiac defibrillator: Secondary | ICD-10-CM | POA: Diagnosis not present

## 2019-11-12 DIAGNOSIS — I4891 Unspecified atrial fibrillation: Secondary | ICD-10-CM

## 2019-11-12 DIAGNOSIS — I10 Essential (primary) hypertension: Secondary | ICD-10-CM

## 2019-11-12 NOTE — Progress Notes (Signed)
HPI Emily [name] returns today for followup. She is a pleasant 69 yo woman with a h/o both atrial and ventricular arrhythmias. She has undergone AV node ablation. She has persistent atrial fib. She has a h/o dietary indiscretion and has undergone adjustments of her meds and dietary change. She has managed to avoid the hospital in the last year.  Allergies  Allergen Reactions  . Amiodarone Rash  . Codeine Other (See Comments)    Euphoria, hallucinations  . Other Palpitations    Other reaction(s): Other (See Comments) PT STATES SHE GETS REALLY HOT , DIZZY HEADED, HAS PASSED OUT TWICE , AND ITCHING WITH IV DYE Tumeric  . Iodine Rash    topical     Current Outpatient Medications  Medication Sig Dispense Refill  . acetaminophen (TYLENOL) 500 MG tablet Take 1,000 mg by mouth every 6 (six) hours as needed for mild pain.    . Calcium Carb-Cholecalciferol (CALCIUM 600-D PO) Take 1 tablet by mouth daily.     . furosemide (LASIX) 80 MG tablet TAKE 1 TABLET BY MOUTH TWICE DAILY. 60 tablet 11  . Glucosamine-Chondroitin (OSTEO BI-FLEX REGULAR STRENGTH PO) Take 1 tablet by mouth daily.    Marland Kitchen levothyroxine (SYNTHROID) 50 MCG tablet TAKE 1 TABLET BY MOUTH EVERY OTHER DAY. 15 tablet 11  . levothyroxine (SYNTHROID) 75 MCG tablet TAKE (1) TABLET BY MOUTH ONCE DAILY BEFORE BREAKFAST (Patient taking differently: Take 75 mcg by mouth daily before breakfast. Except, 50 mcg on Wednesday) 28 tablet 11  . metoprolol succinate (TOPROL-XL) 50 MG 24 hr tablet TAKE (1) TABLET BY MOUTH ONCE DAILY. 90 tablet 1  . Omega-3 Fatty Acids (FISH OIL PO) Take 1,000 mg by mouth daily.     Marland Kitchen omeprazole (PRILOSEC) 40 MG capsule Take 40 mg by mouth daily.     . potassium chloride SA (KLOR-CON) 20 MEQ tablet TAKE (1) TABLET BY MOUTH ONCE DAILY. 28 tablet 11  . simvastatin (ZOCOR) 20 MG tablet TAKE 1 TABLET BY MOUTH ONCE DAILY AT 6 PM. 28 tablet 11  . TOUJEO SOLOSTAR 300 UNIT/ML Solostar Pen INJECT 24 UNITS INTO THE SKIN  DAILY. 4.5 mL 11  . warfarin (COUMADIN) 2 MG tablet Take 1 tablet daily except 2 tablets on Mondays and Thursdays 40 tablet 6   No current facility-administered medications for this visit.     Past Medical History:  Diagnosis Date  . Chronic kidney disease    CKD4  . Chronic systolic dysfunction of left ventricle   . Complete heart block ([place])   . DM (diabetes mellitus) ([place])   . HTN (hypertension)   . Nonischemic cardiomyopathy ([place])   . Persistent atrial fibrillation (HCC)     ROS:   All systems reviewed and negative except as noted in the HPI.   Past Surgical History:  Procedure Laterality Date  . ABLATION    . AV NODE ABLATION N/A 08/08/2017   Procedure: AV NODE ABLATION;  Surgeon: Thompson Grayer, MD;  Location: [facility];  Service: Cardiovascular;  Laterality: N/A;  . BREAST BIOPSY    . CARDIAC DEFIBRILLATOR PLACEMENT  2005   MDT Viva XT CRT-D BiV ICD implanted by Dr Rosaland Lao in [place]    . THYROIDECTOMY    . TOTAL VAGINAL HYSTERECTOMY       Family History  Problem Relation Age of Onset  . Hip fracture Mother   . COPD Father   .  CAD Sister   . Heart attack Sister      Social History   Socioeconomic History  . Marital status: Married    Spouse name: Not on file  . Number of children: Not on file  . Years of education: Not on file  . Highest education level: Not on file  Occupational History  . Not on file  Tobacco Use  . Smoking status: Never Smoker  . Smokeless tobacco: Never Used  Vaping Use  . Vaping Use: Never used  Substance and Sexual Activity  . Alcohol use: No  . Drug use: No  . Sexual activity: Not on file  Other Topics Concern  . Not on file  Social History Narrative  . Not on file   Social Determinants of Health   Financial Resource Strain:   . Difficulty of Paying Living Expenses: Not on file  Food Insecurity:   . Worried About Charity fundraiser in the  Last Year: Not on file  . Ran Out of Food in the Last Year: Not on file  Transportation Needs:   . Lack of Transportation (Medical): Not on file  . Lack of Transportation (Non-Medical): Not on file  Physical Activity:   . Days of Exercise per Week: Not on file  . Minutes of Exercise per Session: Not on file  Stress:   . Feeling of Stress : Not on file  Social Connections:   . Frequency of Communication with Friends and Family: Not on file  . Frequency of Social Gatherings with Friends and Family: Not on file  . Attends Religious Services: Not on file  . Active Member of Clubs or Organizations: Not on file  . Attends Archivist Meetings: Not on file  . Marital Status: Not on file  Intimate Partner Violence:   . Fear of Current or Ex-Partner: Not on file  . Emotionally Abused: Not on file  . Physically Abused: Not on file  . Sexually Abused: Not on file     BP 122/68   Pulse 80   Ht 5\' 2"  (1.575 m)   Wt 134 lb (60.8 kg)   SpO2 98%   BMI 24.51 kg/m   Physical Exam:  Well appearing NAD HEENT: Unremarkable Neck:  No JVD, no thyromegally Lymphatics:  No adenopathy Back:  No CVA tenderness Lungs:  Clear with no wheezes HEART:  Regular rate rhythm, no murmurs, no rubs, no clicks Abd:  soft, positive bowel sounds, no organomegally, no rebound, no guarding Ext:  2 plus pulses, no edema, no cyanosis, no clubbing Skin:  No rashes no nodules Neuro:  CN II through XII intact, motor grossly intact  DEVICE  Normal device function.  See PaceArt for details.   Assess/Plan: 1. Atrial fib - her rates are well controlled. No change in meds. 2. Biv ICD - her medtronic device is working normally. We will recheck in several months. 3. Chronic systolic heart failure - her symptoms are class 2A. I encouraged her to avoid salty foods. 4. VT - she has not had any since her last check.  Emily Overlie Tyleigh Mahn,MD

## 2019-11-12 NOTE — Patient Instructions (Signed)
Medication Instructions:  Your physician recommends that you continue on your current medications as directed. Please refer to the Current Medication list given to you today.  *If you need a refill on your cardiac medications before your next appointment, please call your pharmacy*   Lab Work: NONE   If you have labs (blood work) drawn today and your tests are completely normal, you will receive your results only by: . MyChart Message (if you have MyChart) OR . A paper copy in the mail If you have any lab test that is abnormal or we need to change your treatment, we will call you to review the results.   Testing/Procedures: NONE    Follow-Up: At [facility], you and your health needs are our priority.  As part of our continuing mission to provide you with exceptional heart care, we have created designated Provider Care Teams.  These Care Teams include your primary Cardiologist (physician) and Advanced Practice Providers (APPs -  Physician Assistants and Nurse Practitioners) who all work together to provide you with the care you need, when you need it.  We recommend signing up for the patient portal called "MyChart".  Sign up information is provided on this After Visit Summary.  MyChart is used to connect with patients for Virtual Visits (Telemedicine).  Patients are able to view lab/test results, encounter notes, upcoming appointments, etc.  Non-urgent messages can be sent to your provider as well.   To learn more about what you can do with MyChart, go to [email or web address].    Your next appointment:   1 year(s)  The format for your next appointment:   In Person  Provider:   Gregg Taylor, MD   Other Instructions Thank you for choosing [facility]!    

## 2019-11-13 LAB — CUP PACEART REMOTE DEVICE CHECK
Battery Remaining Longevity: 18 mo
Battery Voltage: 2.91 V
Brady Statistic RA Percent Paced: 0.01 %
Brady Statistic RV Percent Paced: 97.27 %
Date Time Interrogation Session: 20210913043823
HighPow Impedance: 68 Ohm
Implantable Lead Implant Date: 20050218
Implantable Lead Implant Date: 20050218
Implantable Lead Implant Date: 20050218
Implantable Lead Location: 753858
Implantable Lead Location: 753859
Implantable Lead Location: 753860
Implantable Lead Model: 4194
Implantable Lead Model: 5076
Implantable Lead Model: 6947
Implantable Pulse Generator Implant Date: 20160413
Lead Channel Impedance Value: 399 Ohm
Lead Channel Impedance Value: 399 Ohm
Lead Channel Impedance Value: 456 Ohm
Lead Channel Impedance Value: 589 Ohm
Lead Channel Impedance Value: 646 Ohm
Lead Channel Impedance Value: 893 Ohm
Lead Channel Pacing Threshold Amplitude: 0.75 V
Lead Channel Pacing Threshold Amplitude: 0.875 V
Lead Channel Pacing Threshold Amplitude: 1.125 V
Lead Channel Pacing Threshold Pulse Width: 0.4 ms
Lead Channel Pacing Threshold Pulse Width: 0.4 ms
Lead Channel Pacing Threshold Pulse Width: 0.4 ms
Lead Channel Sensing Intrinsic Amplitude: 0.375 mV
Lead Channel Sensing Intrinsic Amplitude: 0.375 mV
Lead Channel Sensing Intrinsic Amplitude: 4.375 mV
Lead Channel Sensing Intrinsic Amplitude: 4.375 mV
Lead Channel Setting Pacing Amplitude: 2 V
Lead Channel Setting Pacing Amplitude: 2.25 V
Lead Channel Setting Pacing Amplitude: 2.75 V
Lead Channel Setting Pacing Pulse Width: 0.4 ms
Lead Channel Setting Pacing Pulse Width: 0.4 ms
Lead Channel Setting Sensing Sensitivity: 0.3 mV

## 2019-11-13 NOTE — Progress Notes (Signed)
Remote ICD transmission.   

## 2019-11-15 NOTE — Progress Notes (Signed)
EPIC Encounter for ICM Monitoring  Patient Name: Emily [name] is a 69 y.o. female Date: 11/15/2019 Primary Care Physican: Ezequiel Essex, MD Primary Cardiologist:Koneswaran Electrophysiologist:Taylor Bi-V Pacing:97.3% 6/30/2021Weight: 130.2lbs   Time in AT/AF 24.0 hr/day (100.0%) Longest AT/AF 78months    Transmission reviewed.  Seen in the office on 11/12/2019 by Dr Lovena Le   OptivolThoracic impedancesuggesting normal fluid levels.  Prescribed:  Furosemide80 mgtake 1 tablettwice daily.  Potassium 20 mEq 1 tabletby mouth daily.  Labs: 03/21/2019 Creatinine2.20[name], Potassium4.9, [id number], [id number] A complete set of results can be found in Results Review.  Recommendations: No changes  Follow-up plan: ICM clinic phone appointment on10/18/2021. 91 day device clinic remote transmission12/13/2021.  EP/Cardiology Office Visits:11/21/2019 with Dr.McDowell.   Copy of ICM check sent to Dr.Taylor.   3 month ICM trend: 11/11/2019    1 Year ICM trend:       [name], RN 11/15/2019 10:51 AM

## 2019-11-20 ENCOUNTER — Ambulatory Visit: Payer: PPO | Admitting: Cardiovascular Disease

## 2019-11-21 ENCOUNTER — Encounter: Payer: Self-pay | Admitting: Cardiology

## 2019-11-21 ENCOUNTER — Ambulatory Visit: Payer: PPO | Admitting: Cardiology

## 2019-11-21 VITALS — BP 102/74 | HR 91 | Ht 63.0 in | Wt 133.0 lb

## 2019-11-21 DIAGNOSIS — N184 Chronic kidney disease, stage 4 (severe): Secondary | ICD-10-CM

## 2019-11-21 DIAGNOSIS — I428 Other cardiomyopathies: Secondary | ICD-10-CM

## 2019-11-21 DIAGNOSIS — I4821 Permanent atrial fibrillation: Secondary | ICD-10-CM

## 2019-11-21 DIAGNOSIS — Z23 Encounter for immunization: Secondary | ICD-10-CM

## 2019-11-21 NOTE — Progress Notes (Signed)
Cardiology Office Note  Date: 11/21/2019   ID: Emily [name], DOB 02-Jul-1950, MRN [id number]  PCP:  Ezequiel Essex, MD  Cardiologist:  Rozann Lesches, MD Electrophysiologist:  None   Chief Complaint  Patient presents with  . Cardiac follow-up    History of Present Illness: Emily [name] is a 69 y.o. female former patient of Dr. Bronson Ing now presenting to establish follow-up with me.  I reviewed her records and updated the chart.  She was last seen in May.  She presents today stating that she has been doing well, no significant leg swelling or increased weight, no orthopnea or PND.  She reports stable NYHA class II dyspnea with house chores and other ADLs.  No palpitations.  She is limited by arthritic hip pain and had a recent flare of gout.  She sees Dr. Lovena Le, Medtronic biventricular ICD in place.  Recent device check revealed normal thoracic impedance.  She has had no device shocks or syncope.  She is on Coumadin with follow-up in anticoagulation clinic, recent INR 2.5.  She does not report any bleeding problems.  Last echocardiogram was in 2019 at which point LVEF was 10 to 15%, and there was mild to moderate RV dysfunction.  I reviewed her medications which are outlined below.  She requested a flu shot today.  Past Medical History:  Diagnosis Date  . Biventricular ICD (implantable cardioverter-defibrillator) in place   . CKD (chronic kidney disease), stage IV ([place])   . Complete heart block ([place])   . Essential hypertension   . Nonischemic cardiomyopathy ([place])   . Persistent atrial fibrillation (HCC)    AV node ablation 2019  . Type 2 diabetes mellitus ([place])     Past Surgical History:  Procedure Laterality Date  . ABLATION    . AV NODE ABLATION N/A 08/08/2017   Procedure: AV NODE ABLATION;  Surgeon: Thompson Grayer, MD;  Location: [facility];  Service: Cardiovascular;  Laterality: N/A;  . BREAST BIOPSY    . CARDIAC DEFIBRILLATOR PLACEMENT  2005   MDT  Viva XT CRT-D BiV ICD implanted by Dr Rosaland Lao in [place]    . THYROIDECTOMY    . TOTAL VAGINAL HYSTERECTOMY      Current Outpatient Medications  Medication Sig Dispense Refill  . acetaminophen (TYLENOL) 500 MG tablet Take 1,000 mg by mouth every 6 (six) hours as needed for mild pain.    . Calcium Carb-Cholecalciferol (CALCIUM 600-D PO) Take 1 tablet by mouth daily.     . furosemide (LASIX) 80 MG tablet TAKE 1 TABLET BY MOUTH TWICE DAILY. 60 tablet 11  . Glucosamine-Chondroitin (OSTEO BI-FLEX REGULAR STRENGTH PO) Take 1 tablet by mouth daily.    Marland Kitchen levothyroxine (SYNTHROID) 50 MCG tablet TAKE 1 TABLET BY MOUTH EVERY OTHER DAY. 15 tablet 11  . levothyroxine (SYNTHROID) 75 MCG tablet TAKE (1) TABLET BY MOUTH ONCE DAILY BEFORE BREAKFAST (Patient taking differently: Take 75 mcg by mouth daily before breakfast. Except, 50 mcg on Wednesday) 28 tablet 11  . metoprolol succinate (TOPROL-XL) 50 MG 24 hr tablet TAKE (1) TABLET BY MOUTH ONCE DAILY. 90 tablet 1  . Omega-3 Fatty Acids (FISH OIL PO) Take 1,000 mg by mouth daily.     Marland Kitchen omeprazole (PRILOSEC) 40 MG capsule Take 40 mg by mouth daily.     . potassium chloride SA (KLOR-CON) 20 MEQ tablet TAKE (1) TABLET BY MOUTH ONCE DAILY. 28 tablet 11  .  simvastatin (ZOCOR) 20 MG tablet TAKE 1 TABLET BY MOUTH ONCE DAILY AT 6 PM. 28 tablet 11  . TOUJEO SOLOSTAR 300 UNIT/ML Solostar Pen INJECT 24 UNITS INTO THE SKIN DAILY. 4.5 mL 11  . warfarin (COUMADIN) 2 MG tablet Take 1 tablet daily except 2 tablets on Mondays and Thursdays 40 tablet 6   No current facility-administered medications for this visit.   Allergies:  Amiodarone, Codeine, Other, and Iodine   ROS:  Arthritis pain.  Physical Exam: VS:  BP 102/74   Pulse 91   Ht 5\' 3"  (1.6 m)   Wt 133 lb (60.3 kg)   SpO2 99%   BMI 23.56 kg/m , BMI Body mass index is 23.56 kg/m.  Wt Readings from Last 3 Encounters:  11/21/19 133 lb (60.3 kg)    11/12/19 134 lb (60.8 kg)  07/19/19 134 lb 3.2 oz (60.9 kg)    General: Patient appears comfortable at rest. HEENT: Conjunctiva and lids normal, wearing a mask. Neck: Supple, no elevated JVP or carotid bruits, no thyromegaly. Lungs: Clear to auscultation, nonlabored breathing at rest. Cardiac: Regular rate and rhythm, no S3, soft systolic murmur. Abdomen: Soft, nontender, bowel sounds present. Extremities: No pitting edema, distal pulses 2+.  ECG:  An ECG dated 07/19/2019 was personally reviewed today and demonstrated:  Ventricular paced rhythm with probable underlying atrial fibrillation.  Recent Labwork: 03/21/2019: ALT 9; AST 16; BUN 29; Creatinine, Ser 2.20; Hemoglobin 14.2; Platelets 252; Potassium 4.9; Sodium 138; TSH 2.140     Component Value Date/Time   CHOL 197 03/21/2019 1519   TRIG 193 (H) 03/21/2019 1519   HDL 45 03/21/2019 1519   CHOLHDL 4.4 03/21/2019 1519   LDLCALC 118 (H) 03/21/2019 1519    Other Studies Reviewed Today:  Echocardiogram 10/20/2017: - Limited echo to evaluate LV function.  - Left ventricle: The cavity size was mildly dilated. Wall  thickness was normal. The estimated ejection fraction was in the  range of 10% to 15%. Diffuse hypokinesis. The study is not  technically sufficient to allow evaluation of LV diastolic  function.  - Regional wall motion abnormality: Akinesis of the mid anterior,  basal-mid anteroseptal, and mid anterolateral myocardium.  - Aortic valve: Moderately calcified annulus. Trileaflet;  moderately thickened leaflets.  - Right ventricle: The cavity size was mildly to moderately  dilated. Systolic function was mildly to moderately reduced. No  TASPE or anular velocity preformed, grossly appears mild to  moderately decreased.   Assessment and Plan:  1.  Nonischemic cardiomyopathy by history, LVEF 10 to 15% as of 2019.  She is status post AV node ablation with permanent atrial fibrillation and also has a  biventricular Medtronic ICD in place.  Recent thoracic impedance normal and weight stable.  Cardiac regimen is limited in the setting of chronic kidney disease, she is currently on Toprol-XL, Zocor, and Lasix.  We will obtain a follow-up echocardiogram and a BMET.  2.  Permanent atrial fibrillation status post AV node ablation.  CHA2DS2-VASc score is 4.  She is on Coumadin with follow-up in anticoagulation clinic.  3.  CKD stage IV, last creatinine 2.20 with normal potassium.  Repeat BMET.  Medication Adjustments/Labs and Tests Ordered: Current medicines are reviewed at length with the patient today.  Concerns regarding medicines are outlined above.   Tests Ordered: Orders Placed This Encounter  Procedures  . Basic Metabolic Panel (BMET)  . ECHOCARDIOGRAM COMPLETE    Medication Changes: No orders of the defined types were placed in this encounter.  Disposition:  Follow up test results and determine next step.  Signed, Satira Sark, MD, [facility] 11/21/2019 11:39 AM    [facility]. [address], [place], [place] [address] Phone: [phone]; Fax: [phone]

## 2019-11-21 NOTE — Patient Instructions (Signed)
Your physician recommends that you schedule a follow-up appointment in: PENDING WITH DR Brinsmade  Your physician recommends that you continue on your current medications as directed. Please refer to the Current Medication list given to you today.  Your physician has requested that you have an echocardiogram. Echocardiography is a painless test that uses sound waves to create images of your heart. It provides your doctor with information about the size and shape of your heart and how well your heart's chambers and valves are working. This procedure takes approximately one hour. There are no restrictions for this procedure.  Your physician recommends that you return for lab work in [facility]  Thank you for choosing [facility]!!

## 2019-11-25 ENCOUNTER — Ambulatory Visit (HOSPITAL_COMMUNITY)
Admission: RE | Admit: 2019-11-25 | Discharge: 2019-11-25 | Disposition: A | Discharge status: Home / Self Care | Payer: PPO | Source: Ambulatory Visit | Attending: Cardiology | Admitting: Cardiology

## 2019-11-25 ENCOUNTER — Other Ambulatory Visit: Payer: Self-pay

## 2019-11-25 DIAGNOSIS — I428 Other cardiomyopathies: Secondary | ICD-10-CM | POA: Diagnosis not present

## 2019-11-25 LAB — ECHOCARDIOGRAM COMPLETE
AR max vel: 1.4 cm2
AV Area VTI: 1.42 cm2
AV Area mean vel: 1.31 cm2
AV Mean grad: 5.5 mmHg
AV Peak grad: 10.5 mmHg
Ao pk vel: 1.62 m/s
Area-P 1/2: 4.43 cm2
MV M vel: 5.24 m/s
MV Peak grad: 109.8 mmHg
S' Lateral: 4.83 cm

## 2019-11-25 NOTE — Progress Notes (Signed)
  Echocardiogram 2D Echocardiogram with 3D has been performed.  Darlina Sicilian M 11/25/2019, 11:19 AM

## 2019-11-26 ENCOUNTER — Other Ambulatory Visit: Payer: Self-pay | Admitting: Cardiology

## 2019-11-26 DIAGNOSIS — I4821 Permanent atrial fibrillation: Secondary | ICD-10-CM | POA: Diagnosis not present

## 2019-11-27 ENCOUNTER — Telehealth: Payer: Self-pay | Admitting: *Deleted

## 2019-11-27 DIAGNOSIS — I5022 Chronic systolic (congestive) heart failure: Secondary | ICD-10-CM

## 2019-11-27 LAB — BASIC METABOLIC PANEL WITH GFR
BUN/Creatinine Ratio: 16 (calc) (ref 6–22)
BUN: 31 mg/dL — ABNORMAL HIGH (ref 7–25)
CO2: 27 mmol/L (ref 20–32)
Calcium: 8.4 mg/dL — ABNORMAL LOW (ref 8.6–10.4)
Chloride: 98 mmol/L (ref 98–110)
Creat: 1.93 mg/dL — ABNORMAL HIGH (ref 0.60–0.93)
GFR, Est African American: 30 mL/min/{1.73_m2} — ABNORMAL LOW (ref >=60)
GFR, Est Non African American: 26 mL/min/{1.73_m2} — ABNORMAL LOW (ref >=60)
Glucose, Bld: 281 mg/dL — ABNORMAL HIGH (ref 65–139)
Potassium: 4.4 mmol/L (ref 3.5–5.3)
Sodium: 137 mmol/L (ref 135–146)

## 2019-11-27 NOTE — Telephone Encounter (Signed)
-----   Message from Satira Sark, MD sent at 11/27/2019  9:17 AM EDT ----- Results reviewed.  Patient with known CKD stage III-IV.  Creatinine 1.93 is somewhat better than prior assessment at 2.2 in January.  Continue with current plan.

## 2019-11-27 NOTE — Addendum Note (Signed)
Addended by: Merlene Laughter on: 11/27/2019 03:57 PM   Modules accepted: Orders

## 2019-11-27 NOTE — Telephone Encounter (Signed)
Agrees to Heart Failure Clinic referral Advised that referral would be placed and she would be contacted with an appointment

## 2019-11-27 NOTE — Telephone Encounter (Signed)
Patient informed and verbalized understanding. Says she will contact our office if she decides on referral to HF Clinic. Copy sent to PCP

## 2019-11-27 NOTE — Telephone Encounter (Signed)
-----  Message from Satira Sark, MD sent at 11/25/2019  5:59 PM EDT ----- Results reviewed.  LVEF 20 to 25%, still significantly reduced but somewhat better than more remote testing.  RV function normal at this point by description.  Moderate mitral regurgitation.  She was doing well clinically when I met her recently, a former patient of Dr. Bronson Ing.  Medical therapy is limited in the setting of chronic kidney disease.  We will certainly continue to follow over time, but I would offer her a consultation in the advanced heart failure clinic for concurrent follow-up as well.

## 2019-12-05 ENCOUNTER — Ambulatory Visit (INDEPENDENT_AMBULATORY_CARE_PROVIDER_SITE_OTHER): Payer: PPO | Admitting: *Deleted

## 2019-12-05 DIAGNOSIS — I4891 Unspecified atrial fibrillation: Secondary | ICD-10-CM

## 2019-12-05 DIAGNOSIS — Z5181 Encounter for therapeutic drug level monitoring: Secondary | ICD-10-CM | POA: Diagnosis not present

## 2019-12-05 DIAGNOSIS — I513 Intracardiac thrombosis, not elsewhere classified: Secondary | ICD-10-CM

## 2019-12-05 LAB — POCT INR: INR: 2.3 (ref 2.0–3.0)

## 2019-12-05 NOTE — Patient Instructions (Signed)
Continue warfarin 1 tablet daily except 2 tablets on Mondays and Thursdays Eat greens/salad tonight Recheck in 6 weeks

## 2019-12-16 ENCOUNTER — Ambulatory Visit (INDEPENDENT_AMBULATORY_CARE_PROVIDER_SITE_OTHER): Payer: PPO

## 2019-12-16 DIAGNOSIS — I5022 Chronic systolic (congestive) heart failure: Secondary | ICD-10-CM | POA: Diagnosis not present

## 2019-12-16 DIAGNOSIS — Z95810 Presence of automatic (implantable) cardiac defibrillator: Secondary | ICD-10-CM

## 2019-12-18 NOTE — Progress Notes (Signed)
EPIC Encounter for ICM Monitoring  Patient Name: Emily [name] is a 69 y.o. female Date: 12/18/2019 Primary Care Physican: Ezequiel Essex, MD Primary Cardiologist:McDowell Electrophysiologist:Taylor Bi-V Pacing:96.8% 10/0/2021Weight: 132lbs  Clinical Status (12-Nov-2019 to 16-Dec-2019)  AT/AF 1 episode   Time in AT/AF 24.0 hr/day (100.0%)   Observations (1) (12-Nov-2019 to 16-Dec-2019)  AT/AF >= 6 hr for 34 days    Spoke with patient and reports feeling well at this time.  Denies fluid symptoms.     OptivolThoracic impedancesuggesting normal fluid levels.  Prescribed:  Furosemide80 mgtake 1 tablettwice daily.  Potassium 20 mEq 1 tabletby mouth daily.  Labs: 03/21/2019 Creatinine2.20[name], Potassium4.9, [id number], [id number] A complete set of results can be found in Results Review.  Recommendations:No changes and encouraged to call if experiencing any fluid symptoms.  Follow-up plan: ICM clinic phone appointment on11/22/2021. 91 day device clinic remote transmission12/13/2021.  EP/Cardiology Office Visits: Was offered Advance HF clinic referral by Dr Domenic Polite after 11/21/2019 OV.   Copy of ICM check sent to Dr.Taylor.    3 month ICM trend: 12/16/2019    1 Year ICM trend:       [name], RN 12/18/2019 10:11 AM

## 2019-12-31 ENCOUNTER — Encounter: Payer: Self-pay | Admitting: Family Medicine

## 2019-12-31 ENCOUNTER — Other Ambulatory Visit: Payer: Self-pay

## 2019-12-31 ENCOUNTER — Ambulatory Visit (INDEPENDENT_AMBULATORY_CARE_PROVIDER_SITE_OTHER): Payer: PPO | Admitting: Family Medicine

## 2019-12-31 VITALS — BP 120/80 | HR 88 | Wt 133.6 lb

## 2019-12-31 DIAGNOSIS — H6122 Impacted cerumen, left ear: Secondary | ICD-10-CM | POA: Insufficient documentation

## 2019-12-31 DIAGNOSIS — R0989 Other specified symptoms and signs involving the circulatory and respiratory systems: Secondary | ICD-10-CM | POA: Insufficient documentation

## 2019-12-31 DIAGNOSIS — Z794 Long term (current) use of insulin: Secondary | ICD-10-CM

## 2019-12-31 DIAGNOSIS — E1122 Type 2 diabetes mellitus with diabetic chronic kidney disease: Secondary | ICD-10-CM | POA: Diagnosis not present

## 2019-12-31 DIAGNOSIS — N184 Chronic kidney disease, stage 4 (severe): Secondary | ICD-10-CM | POA: Diagnosis not present

## 2019-12-31 HISTORY — DX: Impacted cerumen, left ear: H61.22

## 2019-12-31 LAB — POCT GLYCOSYLATED HEMOGLOBIN (HGB A1C): Hemoglobin A1C: 9.9 % — AB (ref 4.0–5.6)

## 2019-12-31 NOTE — Progress Notes (Signed)
SUBJECTIVE:   CHIEF COMPLAINT / HPI:   Type 2 diabetes, insulin dependent Here for an A1c check.  Currently on Toujeo insulin.  Last check in January she had A1c of 10.0, at that time Dr. Shan Levans wanted to increase her insulin but she was unable to afford it.  Cannot take many diabetes medications, such as Metformin, because of her stage IV kidney disease.  Decreased hearing of left ear Reports decreased hearing left ear since developing a left lower tooth abscess.  She has been on antibiotics for tooth abscess for "a couple weeks".  Scheduled for a root canal on Thursday.  She reports that the dentist believes the decreased hearing is due to the tooth infection and subsequent inflammation; however she would like me to take a look and see if there is anything else we can do.  Lump in throat She experiences intermittent coughing throughout the day and was feeling "as though something is right they are stuck in my throat".  Will constantly "cough a lump up and that it falls back down".  She never coughs this phlegm up and out of her mouth because she finds phlegm repungent.  Says it is relieved sometimes by an allergy pill she gets at the dollar store; may be Benadryl.  Does not feel as though she has any congestion, sore or itchy throat, itchy watery eyes, or nasal discharge.  PERTINENT  PMH / PSH: Chronic systolic congestive heart failure, dilated cardiomyopathy, atrial fibrillation with RVR on warfarin anticoagulation and rate control, coronary artery disease, hypertension, type 2 diabetes, stage IV chronic kidney disease, hypothyroidism, high cholesterol  OBJECTIVE:   BP 120/80   Pulse 88   Wt 133 lb 9.6 oz (60.6 kg)   SpO2 99%   BMI 23.67 kg/m    PHQ-9:  Depression screen [facility] 2/9 12/31/2019 03/21/2019 12/01/2017  Decreased Interest 0 0 0  Down, Depressed, Hopeless 0 0 0  PHQ - 2 Score 0 0 0  Altered sleeping 0 - -  Tired, decreased energy 3 - -  Change in appetite 0 - -  Feeling  bad or failure about yourself  0 - -  Trouble concentrating 0 - -  Moving slowly or fidgety/restless 0 - -  Suicidal thoughts 0 - -  PHQ-9 Score 3 - -  Difficult doing work/chores Not difficult at all - -     GAD-7: No flowsheet data found.    Physical Exam General: Awake, alert, oriented HEENT: Right TM pearly pink, non-bulging, with appropriate cone of light, left external ear canal occluded by impacted cerumen, bilateral nasal cobblestoning, moderate dentition with several teeth missing and to cold caps on posterior left lower jaw. Cardiovascular: Regular rate with irregularly irregular rhythm, consistent with known A.fib Respiratory: Lung fields clear to auscultation bilaterally Extremities: No bilateral lower extremity edema, palpable pedal and pretibial pulses bilaterally Neuro: Cranial nerves II through X grossly intact, able to move all extremities spontaneously   ASSESSMENT/PLAN:   Type 2 diabetes mellitus with diabetic chronic kidney disease ([place]) A1c today 9.9, unchanged from 10.0 in January 2021. Currently on Toujeo but unable to afford higher doses of this. Can't take many other diabetes meds because of Stage 4 Kidney Disease. Will reach out to [facility] team to see if they can help with cost through scholarship, grant, or manufacturer initiative.   Hearing loss of left ear due to cerumen impaction Relieved today with ear flushing by nurse. Counseled pt on OTC ear drop options available  to mitigate cerumen build up.   Phlegm in throat Likely related to chronic seasonal/environmental allergies. Recommend daily nasal corticosteroid at bedtime, stressed importance of consistent daily use. Also to consider daily claritin or other second generation anti-histamine for further symptom relief.      Ezequiel Essex, MD [place]

## 2019-12-31 NOTE — Assessment & Plan Note (Signed)
Likely related to chronic seasonal/environmental allergies. Recommend daily nasal corticosteroid at bedtime, stressed importance of consistent daily use. Also to consider daily claritin or other second generation anti-histamine for further symptom relief.

## 2019-12-31 NOTE — Assessment & Plan Note (Signed)
A1c today 9.9, unchanged from 10.0 in January 2021. Currently on Toujeo but unable to afford higher doses of this. Can't take many other diabetes meds because of Stage 4 Kidney Disease. Will reach out to [facility] team to see if they can help with cost through scholarship, grant, or manufacturer initiative.

## 2019-12-31 NOTE — Patient Instructions (Addendum)
It was wonderful to meet you today. Thank you for allowing me to be a part of your care. Below is a short summary of what we discussed at your visit today:  Type 2 diabetes Your A1c today was 9.9, which is essentially unchanged from this past January when it was 10.0.  I would like to increase your insulin, however I understand that there are significant financial barriers to this.  We have a couple of options here.  We could switch to a less expensive insulin available at [facility], but you would need to take it twice a day.    First, I am going to reach out to a care coordination team we have here and see if they can help coordinate obtaining a scholarship grant from the insulin manufacturer to cover your insulin.  Decreased hearing in left ear Because of your recent history of this left tooth abscess and impacted cerumen (earwax) I saw today, I believe this is the source of your decreased hearing.  At this time, I do not believe we need to pursue more advanced testing for this.  After your root canal, the inflammation in that area of your anatomy should go down, leading to resolution of this problem.  Today, our wonderful nurse flushed out some of the earwax from your left ear canal.  This should also provide some symptomatic relief.  Please let us know if you do not experience improvement in hearing in your left ear after the root canal.  Lump in throat The lump you described in your throat you cough up quite often sounds like phlegm.  Because it is relieved with Benadryl (an antihistamine medicine) I believe this to be result of chronic allergies.  The best first-line treatment we have for this sort of thing is a daily nasal corticosteroid spray just like Flonase.  Follow the directions on the packaging to spray in your nostrils at bedtime every day.  You must take this every day for it to be effective.  The best price Flonase is a package of 5 bottles for about $30 at a large box store such as [employer]  or [place] should be able to purchase medications at these affordable prices from [place] or [employer] without a membership.  If you do not experience improvement in a week or so, you could also consider starting a daily allergy medication such as Claritin.  Again, you must take it every day in order for her to have maximum effect.  Please avoid taking Benadryl, as this can be very sedating and may interact with some of your other medications.   If you have any questions or concerns, please do not hesitate to contact us via phone or MyChart message.   Ezequiel Essex, MD

## 2019-12-31 NOTE — Assessment & Plan Note (Signed)
Relieved today with ear flushing by nurse. Counseled pt on OTC ear drop options available to mitigate cerumen build up.

## 2020-01-13 ENCOUNTER — Other Ambulatory Visit: Payer: Self-pay

## 2020-01-14 ENCOUNTER — Other Ambulatory Visit: Payer: Self-pay | Admitting: Family Medicine

## 2020-01-14 DIAGNOSIS — E039 Hypothyroidism, unspecified: Secondary | ICD-10-CM

## 2020-01-14 MED ORDER — LEVOTHYROXINE SODIUM 75 MCG PO TABS
75.0000 ug | ORAL_TABLET | Freq: Every day | ORAL | 2 refills | Status: DC
Start: 2020-01-14 — End: 2020-01-21

## 2020-01-14 NOTE — Progress Notes (Signed)
Will need annual TSH lab in January 2022.

## 2020-01-16 ENCOUNTER — Ambulatory Visit (INDEPENDENT_AMBULATORY_CARE_PROVIDER_SITE_OTHER): Payer: PPO | Admitting: *Deleted

## 2020-01-16 DIAGNOSIS — I513 Intracardiac thrombosis, not elsewhere classified: Secondary | ICD-10-CM

## 2020-01-16 DIAGNOSIS — Z5181 Encounter for therapeutic drug level monitoring: Secondary | ICD-10-CM

## 2020-01-16 DIAGNOSIS — I4891 Unspecified atrial fibrillation: Secondary | ICD-10-CM

## 2020-01-16 LAB — POCT INR: INR: 2 (ref 2.0–3.0)

## 2020-01-16 NOTE — Patient Instructions (Signed)
Continue warfarin 1 tablet daily except 2 tablets on Mondays and Thursdays Eat greens/salad tonight Recheck in 6 weeks

## 2020-01-20 ENCOUNTER — Ambulatory Visit (INDEPENDENT_AMBULATORY_CARE_PROVIDER_SITE_OTHER): Payer: PPO

## 2020-01-20 ENCOUNTER — Telehealth: Payer: Self-pay

## 2020-01-20 DIAGNOSIS — Z95810 Presence of automatic (implantable) cardiac defibrillator: Secondary | ICD-10-CM | POA: Diagnosis not present

## 2020-01-20 DIAGNOSIS — I5022 Chronic systolic (congestive) heart failure: Secondary | ICD-10-CM | POA: Diagnosis not present

## 2020-01-20 NOTE — Progress Notes (Signed)
EPIC Encounter for ICM Monitoring  Patient Name: Emily [name] is a 69 y.o. female Date: 01/20/2020 Primary Care Physican: Ezequiel Essex, MD Primary Cardiologist:McDowell/McLean Electrophysiologist:Taylor Bi-V Pacing:93.5% 10/0/2021Weight: 132lbs   Time in AT/AF 24.0 hr/day (100.0%)  Longest AT/AF 27 months    Attempted call to patient and unable to reach.  Transmission reviewed.    OptivolThoracic impedancesuggesting normal fluid levels.  Prescribed:  Furosemide80 mgtake 1 tablettwice daily.  Potassium 20 mEq 1 tabletby mouth daily.  Labs: 03/21/2019 Creatinine2.20[name], Potassium4.9, [id number], [id number] A complete set of results can be found in Results Review.  Recommendations:Left voice mail with ICM number and encouraged to call if experiencing any fluid symptoms.  Follow-up plan: ICM clinic phone appointment on12/27/2021. 91 day device clinic remote transmission12/13/2021.  EP/Cardiology Office Visits: 01/21/2020 with Dr Aundra Dubin (1st visit).   Copy of ICM check sent to Dr.Taylor.    3 month ICM trend: 01/20/2020    1 Year ICM trend:       [name], RN 01/20/2020 10:22 AM

## 2020-01-20 NOTE — Telephone Encounter (Signed)
Remote ICM transmission received.  Attempted call to patient regarding ICM remote transmission and no answer or answering machine. 

## 2020-01-21 ENCOUNTER — Encounter (HOSPITAL_COMMUNITY): Payer: Self-pay | Admitting: Cardiology

## 2020-01-21 ENCOUNTER — Other Ambulatory Visit: Payer: Self-pay

## 2020-01-21 ENCOUNTER — Ambulatory Visit (HOSPITAL_COMMUNITY)
Admission: RE | Admit: 2020-01-21 | Discharge: 2020-01-21 | Disposition: A | Discharge status: Home / Self Care | Payer: PPO | Source: Ambulatory Visit | Attending: Cardiology | Admitting: Cardiology

## 2020-01-21 VITALS — BP 130/82 | HR 94 | Wt 134.6 lb

## 2020-01-21 DIAGNOSIS — R0602 Shortness of breath: Secondary | ICD-10-CM | POA: Diagnosis not present

## 2020-01-21 DIAGNOSIS — I252 Old myocardial infarction: Secondary | ICD-10-CM | POA: Diagnosis not present

## 2020-01-21 DIAGNOSIS — I428 Other cardiomyopathies: Secondary | ICD-10-CM | POA: Insufficient documentation

## 2020-01-21 DIAGNOSIS — E1122 Type 2 diabetes mellitus with diabetic chronic kidney disease: Secondary | ICD-10-CM | POA: Diagnosis not present

## 2020-01-21 DIAGNOSIS — I5022 Chronic systolic (congestive) heart failure: Secondary | ICD-10-CM | POA: Insufficient documentation

## 2020-01-21 DIAGNOSIS — I4821 Permanent atrial fibrillation: Secondary | ICD-10-CM

## 2020-01-21 DIAGNOSIS — Z794 Long term (current) use of insulin: Secondary | ICD-10-CM | POA: Insufficient documentation

## 2020-01-21 DIAGNOSIS — Z7901 Long term (current) use of anticoagulants: Secondary | ICD-10-CM | POA: Insufficient documentation

## 2020-01-21 DIAGNOSIS — N184 Chronic kidney disease, stage 4 (severe): Secondary | ICD-10-CM | POA: Insufficient documentation

## 2020-01-21 DIAGNOSIS — I420 Dilated cardiomyopathy: Secondary | ICD-10-CM | POA: Diagnosis not present

## 2020-01-21 DIAGNOSIS — Z79899 Other long term (current) drug therapy: Secondary | ICD-10-CM | POA: Diagnosis not present

## 2020-01-21 LAB — BASIC METABOLIC PANEL
Anion gap: 15 (ref 5–15)
BUN: 35 mg/dL — ABNORMAL HIGH (ref 8–23)
CO2: 20 mmol/L — ABNORMAL LOW (ref 22–32)
Calcium: 8.9 mg/dL (ref 8.9–10.3)
Chloride: 100 mmol/L (ref 98–111)
Creatinine, Ser: 2.13 mg/dL — ABNORMAL HIGH (ref 0.44–1.00)
GFR, Estimated: 24 mL/min — ABNORMAL LOW (ref >60)
Glucose, Bld: 302 mg/dL — ABNORMAL HIGH (ref 70–99)
Potassium: 4.2 mmol/L (ref 3.5–5.1)
Sodium: 135 mmol/L (ref 135–145)

## 2020-01-21 MED ORDER — ENTRESTO 24-26 MG PO TABS: 1.0000 | ORAL_TABLET | Freq: Two times a day (BID) | ORAL | 3 refills | Status: DC

## 2020-01-21 MED ORDER — FUROSEMIDE 40 MG PO TABS: ORAL_TABLET | ORAL | 3 refills | Status: DC

## 2020-01-21 NOTE — Patient Instructions (Signed)
Decrease Furosemide to 80 mg (2 tabs) in AM and 40 mg (1 tab) in PM  Start Entresto 24/26 mg Twice daily   Labs done today, we will call you with abnormal results  Your physician recommends that you return for lab work in: 1-2 weeks  Your physician recommends that you schedule a follow-up appointment in: 1 month  If you have any questions or concerns before your next appointment please send [place] a message through [place] or call our office at [phone].    TO LEAVE A MESSAGE FOR THE NURSE SELECT OPTION 2, PLEASE LEAVE A MESSAGE INCLUDING: . YOUR NAME . DATE OF BIRTH . CALL BACK NUMBER . REASON FOR CALL**this is important as we prioritize the call backs  [place] AS LONG AS YOU CALL BEFORE 4:00 PM  At the [facility], you and your health needs are our priority. As part of our continuing mission to provide you with exceptional heart care, we have created designated Provider Care Teams. These Care Teams include your primary Cardiologist (physician) and Advanced Practice Providers (APPs- Physician Assistants and Nurse Practitioners) who all work together to provide you with the care you need, when you need it.   You may see any of the following providers on your designated Care Team at your next follow up: Marland Kitchen Dr Glori Bickers . Dr Loralie Champagne . Darrick Grinder, NP . Lyda Jester, PA . Audry Riles, PharmD   Please be sure to bring in all your medications bottles to every appointment.

## 2020-01-22 ENCOUNTER — Other Ambulatory Visit: Payer: Self-pay

## 2020-01-22 ENCOUNTER — Telehealth: Payer: Self-pay

## 2020-01-22 ENCOUNTER — Other Ambulatory Visit: Payer: Self-pay | Admitting: *Deleted

## 2020-01-22 DIAGNOSIS — Z5181 Encounter for therapeutic drug level monitoring: Secondary | ICD-10-CM

## 2020-01-22 DIAGNOSIS — I4821 Permanent atrial fibrillation: Secondary | ICD-10-CM

## 2020-01-22 MED ORDER — WARFARIN SODIUM 2 MG PO TABS: ORAL_TABLET | ORAL | 6 refills | Status: DC

## 2020-01-22 MED ORDER — SIMVASTATIN 20 MG PO TABS
20.0000 mg | ORAL_TABLET | Freq: Every day | ORAL | 6 refills | Status: DC
Start: 2020-01-22 — End: 2020-08-18

## 2020-01-22 NOTE — Telephone Encounter (Signed)
[place] calls nurse line requesting a refill on Levothyroxine. I see where the 11/16 prescription was discontinued? The pharmacy reports they have no current prescription on file for her. Please send in.

## 2020-01-22 NOTE — Telephone Encounter (Signed)
This is a [place] pt °

## 2020-01-22 NOTE — Progress Notes (Signed)
PCP: Ezequiel Essex, MD Cardiology: Dr. Domenic Polite HF Cardiology: Dr. Aundra Dubin  69 y.o. with history of CKD stage IV, permanent atrial fibrillation, and a long-standing nonischemic cardiomyopathy was referred by Dr. Domenic Polite for CHF clinic evaluation.  She first developed cardiac problems in 1988.  At that time, she was admitted with CHF and eventually underwent myocardial biopsy at [facility], per her report showing myocarditis.  Patient states that her EF has been low since then.  She had MDT CRT-D device implanted.  Most recent echo in 7/21 showed EF 20-25%, moderate MR, mild AS. She is now in permanent atrial fibrillation and is s/p AV nodal ablation with BiV pacing. Elevated creatinine has limited medication management.    Symptomatically, she is stable.  She is chronically short of breath after walking about 200 feet.  No lightheadedness.  Short of breath walking up stairs.  No chest pain.  Chronic mild orthopnea, sleeps on a thick pillow.   Labs (1/21): LDL 118 Labs (9/21): K 4.4, creatinine 1.93  Medtronic device interrogation: Chronic atrial fibrillation, 95% BiV pacing, thoracic impedance stable.   PMH: 1. Gout 2. Complete heart block: Medtronic CRT device.  3. CKD stage IV: Diabetic nephropathy.  4. Type 2 diabetes 5. Atrial fibrillation: Now permanent.   - AF ablation 4/18.  - AV nodal ablation in 2019 with MDT CRT-D.  6. Remote LV thrombus 7. Chronic systolic CHF: Nonischemic cardiomyopathy. Medtronic CRT-D device.  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  - LHC 2004: Nonobstructive mild CAD.  - Echo (8/19): EF 10-15%, mildly dilated LV, mild-moderately dilated RV with mild-moderately decreased systolic function.  - Echo (7/21): EF 20-25%, low normal RV function, moderate MR, mild AS.  8. H/o cholecystectomy 9. Hypothyroidism 10. 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  No smoking or ETOH.   Family History  Problem  Relation Age of Onset  . Hip fracture Mother   . COPD Father   . CAD Sister   . Heart attack Sister    ROS: All systems reviewed and negative except as per HPI.   Current Outpatient Medications  Medication Sig Dispense Refill  . acetaminophen (TYLENOL) 500 MG tablet Take 1,000 mg by mouth every 6 (six) hours as needed for mild pain.    . Calcium Carb-Cholecalciferol (CALCIUM 600-D PO) Take 1 tablet by mouth daily.     . furosemide (LASIX) 40 MG tablet Take 2 tablets (80 mg total) by mouth in the morning AND 1 tablet (40 mg total) every evening. Please cancel all previous orders for current medication. Change in dosage or pill size.. 90 tablet 3  . Glucosamine-Chondroitin (OSTEO BI-FLEX REGULAR STRENGTH PO) Take 1 tablet by mouth daily.    Marland Kitchen levothyroxine (SYNTHROID) 88 MCG tablet Take 88 mcg by mouth daily before breakfast.    . metoprolol succinate (TOPROL-XL) 50 MG 24 hr tablet TAKE (1) TABLET BY MOUTH ONCE DAILY. 90 tablet 1  . Omega-3 Fatty Acids (FISH OIL PO) Take 1,000 mg by mouth daily.     Marland Kitchen omeprazole (PRILOSEC) 40 MG capsule Take 40 mg by mouth daily.     . potassium chloride SA (KLOR-CON) 20 MEQ tablet TAKE (1) TABLET BY MOUTH ONCE DAILY. 28 tablet 11  . TOUJEO SOLOSTAR 300 UNIT/ML Solostar Pen INJECT 24 UNITS INTO THE SKIN DAILY. 4.5 mL 11  . sacubitril-valsartan (ENTRESTO) 24-26 MG Take 1 tablet by mouth 2 (two) times daily. 60 tablet 3  . simvastatin (  ZOCOR) 20 MG tablet Take 1 tablet (20 mg total) by mouth daily. 30 tablet 6  . warfarin (COUMADIN) 2 MG tablet Take 1 tablet daily except 2 tablets on Mondays and Thursdays 40 tablet 6   No current facility-administered medications for this encounter.   BP 130/82   Pulse 94   Wt 61.1 kg (134 lb 9.6 oz)   SpO2 100%   BMI 23.84 kg/m  General: NAD Neck: No JVD, no thyromegaly or thyroid nodule.  Lungs: Clear to auscultation bilaterally with normal respiratory effort. CV: Nondisplaced PMI.  Heart regular S1/S2, no S3/S4, no  murmur.  No peripheral edema.  No carotid bruit.  Normal pedal pulses.  Abdomen: Soft, nontender, no hepatosplenomegaly, no distention.  Skin: Intact without lesions or rashes.  Neurologic: Alert and oriented x 3.  Psych: Normal affect. Extremities: No clubbing or cyanosis.  HEENT: Normal.   Assessment/Plan: 1. Chronic systolic CHF: Nonischemic cardiomyopathy.  Low EF for years.  Per patient's report, she had an endomyocardial biopsy at [facility] in 1988 showing myocarditis.  Medication titration has been limited by CKD stage IV. MDT CRT-D device.   Most recent echo showed EF 20-25%, moderate MR, mild AS.  On exam and by Optivol, she is not volume overloaded.  NYHA class II symptoms, stable.  - Decrease Lasix to 80 qam/40 qpm.  - Start Entresto 24/26 but will need to watch creatinine very carefully.  Check today and again in 10 days.  - Continue Toprol XL 50 mg daily.  - Continue medication titration based on creatinine response.  2.  Atrial fibrillation: Permanent.  S/p AV nodal ablation with BiV pacing.   - Continue warfarin.  3. CKD: Stage IV.  Follow closely with medication adjustment.   Loralie Champagne 01/22/2020

## 2020-01-25 ENCOUNTER — Other Ambulatory Visit: Payer: Self-pay | Admitting: Family Medicine

## 2020-01-25 DIAGNOSIS — E039 Hypothyroidism, unspecified: Secondary | ICD-10-CM

## 2020-01-25 MED ORDER — LEVOTHYROXINE SODIUM 88 MCG PO TABS: 88.0000 ug | ORAL_TABLET | Freq: Every day | ORAL | 2 refills | Status: DC

## 2020-01-31 ENCOUNTER — Other Ambulatory Visit: Payer: Self-pay

## 2020-01-31 ENCOUNTER — Ambulatory Visit (HOSPITAL_COMMUNITY)
Admission: RE | Admit: 2020-01-31 | Discharge: 2020-01-31 | Disposition: A | Discharge status: Home / Self Care | Payer: PPO | Source: Ambulatory Visit | Attending: Cardiology | Admitting: Cardiology

## 2020-01-31 DIAGNOSIS — I420 Dilated cardiomyopathy: Secondary | ICD-10-CM | POA: Diagnosis not present

## 2020-01-31 LAB — BASIC METABOLIC PANEL
Anion gap: 14 (ref 5–15)
BUN: 26 mg/dL — ABNORMAL HIGH (ref 8–23)
CO2: 22 mmol/L (ref 22–32)
Calcium: 8.7 mg/dL — ABNORMAL LOW (ref 8.9–10.3)
Chloride: 105 mmol/L (ref 98–111)
Creatinine, Ser: 1.77 mg/dL — ABNORMAL HIGH (ref 0.44–1.00)
GFR, Estimated: 30 mL/min — ABNORMAL LOW (ref >60)
Glucose, Bld: 145 mg/dL — ABNORMAL HIGH (ref 70–99)
Potassium: 4 mmol/L (ref 3.5–5.1)
Sodium: 141 mmol/L (ref 135–145)

## 2020-02-10 ENCOUNTER — Ambulatory Visit (INDEPENDENT_AMBULATORY_CARE_PROVIDER_SITE_OTHER): Payer: PPO

## 2020-02-10 DIAGNOSIS — I420 Dilated cardiomyopathy: Secondary | ICD-10-CM

## 2020-02-10 DIAGNOSIS — I5022 Chronic systolic (congestive) heart failure: Secondary | ICD-10-CM

## 2020-02-10 LAB — CUP PACEART REMOTE DEVICE CHECK
Battery Remaining Longevity: 14 mo
Battery Voltage: 2.89 V
Brady Statistic RA Percent Paced: 0.01 %
Brady Statistic RV Percent Paced: 92.64 %
Date Time Interrogation Session: 20211213031604
HighPow Impedance: 70 Ohm
Implantable Lead Implant Date: 20050218
Implantable Lead Implant Date: 20050218
Implantable Lead Implant Date: 20050218
Implantable Lead Location: 753858
Implantable Lead Location: 753859
Implantable Lead Location: 753860
Implantable Lead Model: 4194
Implantable Lead Model: 5076
Implantable Lead Model: 6947
Implantable Pulse Generator Implant Date: 20160413
Lead Channel Impedance Value: 361 Ohm
Lead Channel Impedance Value: 418 Ohm
Lead Channel Impedance Value: 456 Ohm
Lead Channel Impedance Value: 589 Ohm
Lead Channel Impedance Value: 646 Ohm
Lead Channel Impedance Value: 950 Ohm
Lead Channel Pacing Threshold Amplitude: 0.75 V
Lead Channel Pacing Threshold Amplitude: 0.875 V
Lead Channel Pacing Threshold Amplitude: 1.125 V
Lead Channel Pacing Threshold Pulse Width: 0.4 ms
Lead Channel Pacing Threshold Pulse Width: 0.4 ms
Lead Channel Pacing Threshold Pulse Width: 0.4 ms
Lead Channel Sensing Intrinsic Amplitude: 0.5 mV
Lead Channel Sensing Intrinsic Amplitude: 0.5 mV
Lead Channel Sensing Intrinsic Amplitude: 5.875 mV
Lead Channel Sensing Intrinsic Amplitude: 5.875 mV
Lead Channel Setting Pacing Amplitude: 2 V
Lead Channel Setting Pacing Amplitude: 2.25 V
Lead Channel Setting Pacing Amplitude: 2.5 V
Lead Channel Setting Pacing Pulse Width: 0.4 ms
Lead Channel Setting Pacing Pulse Width: 0.4 ms
Lead Channel Setting Sensing Sensitivity: 0.3 mV

## 2020-02-18 ENCOUNTER — Encounter (HOSPITAL_COMMUNITY): Payer: Self-pay

## 2020-02-18 ENCOUNTER — Telehealth (HOSPITAL_COMMUNITY): Payer: Self-pay | Admitting: Pharmacy Technician

## 2020-02-18 NOTE — Telephone Encounter (Signed)
Patient Advocate Encounter   Received notification from [employer] that prior authorization for Emily [name] is required.   PA submitted on CoverMyMeds Key [id number] Status is pending   Will continue to follow.  Called and spoke with patient. She only had 2 days left of Entresto. Sent nursing staff a message to provide her samples. Urged the patient to come and get samples. With the holiday I am not sure how fast insurance will approve the PA.

## 2020-02-18 NOTE — Progress Notes (Signed)
Medication Samples have been provided to the patient.  Drug name: Delene Loll       Strength: 24/26mg         Qty: 4 bottles (28 tablets each)  LOT: [id number]  Exp.Date: 5/23  Dosing instructions: 1 tablet twice daily  The patient has been instructed regarding the correct time, dose, and frequency of taking this medication, including desired effects and most common side effects.   Park Pope Kylina Vultaggio 4:16 PM 02/18/2020

## 2020-02-19 NOTE — Telephone Encounter (Signed)
Advanced Heart Failure Patient Advocate Encounter  Prior Authorization for Delene Loll has been approved.    Effective dates: 02/19/20 through 02/18/21  Called and spoke with patient. Her pharmacy is going to send her Entresto via the mail, samples are not needed at this time.   Charlann Boxer, CPhT

## 2020-02-24 ENCOUNTER — Ambulatory Visit (INDEPENDENT_AMBULATORY_CARE_PROVIDER_SITE_OTHER): Payer: PPO

## 2020-02-24 DIAGNOSIS — Z95810 Presence of automatic (implantable) cardiac defibrillator: Secondary | ICD-10-CM | POA: Diagnosis not present

## 2020-02-24 DIAGNOSIS — I5022 Chronic systolic (congestive) heart failure: Secondary | ICD-10-CM

## 2020-02-25 NOTE — Progress Notes (Signed)
Remote ICD transmission.   

## 2020-02-26 NOTE — Progress Notes (Signed)
EPIC Encounter for ICM Monitoring  Patient Name: Emily [name] is a 69 y.o. female Date: 02/26/2020 Primary Care Physican: Ezequiel Essex, MD Primary Cardiologist:McDowell/McLean Electrophysiologist:Taylor Bi-V Pacing:91/.8% 12/29/2021Weight: 132lbs   Time in AT/AF 24.0 hr/day (100.0%)  Longest AT/AF             28 months    Spoke with patient and reports feeling well at this time.  Denies fluid symptoms.     OptivolThoracic impedancesuggesting normal fluid levels.  Prescribed:  Furosemide80 mgtake 1 tabletevery morning and 40 mg every night (decreased 11/23 when Entresto started).  Potassium 20 mEq 1 tabletby mouth daily.  Labs: 01/31/2020 Creatinine 1.77, BUN 26, Potassium 4.0, Sodium 141, GFR 30 01/21/2020 Creatinine 2.13, BUN 35, Potassium 4.2, Sodium 135, GFR 24  03/21/2019 Creatinine2.20, BUN29, Potassium4.9, Sodium138, LVD47-18 A complete set of results can be found in Results Review.  Recommendations:No changes and encouraged to call if experiencing any fluid symptoms.  Follow-up plan: ICM clinic phone appointment on2/02/2020. 91 day device clinic remote transmission3/14/2022.  EP/Cardiology Office Visits: 03/02/2020 with Dr Aundra Dubin.   Copy of ICM check sent to Dr.Taylor.   3 month ICM trend: 02/24/2020    1 Year ICM trend:       [name], RN 02/26/2020 12:03 PM

## 2020-03-02 ENCOUNTER — Encounter (HOSPITAL_COMMUNITY): Payer: PPO | Admitting: Cardiology

## 2020-03-03 ENCOUNTER — Ambulatory Visit (INDEPENDENT_AMBULATORY_CARE_PROVIDER_SITE_OTHER): Payer: PPO | Admitting: *Deleted

## 2020-03-03 DIAGNOSIS — I513 Intracardiac thrombosis, not elsewhere classified: Secondary | ICD-10-CM

## 2020-03-03 DIAGNOSIS — I4891 Unspecified atrial fibrillation: Secondary | ICD-10-CM | POA: Diagnosis not present

## 2020-03-03 DIAGNOSIS — Z5181 Encounter for therapeutic drug level monitoring: Secondary | ICD-10-CM

## 2020-03-03 LAB — POCT INR: INR: 2.2 (ref 2.0–3.0)

## 2020-03-03 NOTE — Patient Instructions (Signed)
Continue warfarin 1 tablet daily except 2 tablets on Mondays and Thursdays Eat greens/salad tonight Recheck in 6 weeks

## 2020-03-11 ENCOUNTER — Encounter (HOSPITAL_COMMUNITY): Payer: PPO | Admitting: Cardiology

## 2020-03-20 ENCOUNTER — Other Ambulatory Visit: Payer: Self-pay | Admitting: Cardiology

## 2020-03-31 ENCOUNTER — Ambulatory Visit (INDEPENDENT_AMBULATORY_CARE_PROVIDER_SITE_OTHER): Payer: PPO

## 2020-03-31 DIAGNOSIS — Z95810 Presence of automatic (implantable) cardiac defibrillator: Secondary | ICD-10-CM | POA: Diagnosis not present

## 2020-03-31 DIAGNOSIS — I5022 Chronic systolic (congestive) heart failure: Secondary | ICD-10-CM | POA: Diagnosis not present

## 2020-04-03 NOTE — Progress Notes (Signed)
EPIC Encounter for ICM Monitoring  Patient Name: Emily [name] is a 70 y.o. female Date: 04/03/2020 Primary Care Physican: Ezequiel Essex, MD Primary Cardiologist:McDowell/McLean Electrophysiologist:Taylor Bi-V Pacing:91.8% 2/4/2022Weight: 132 - 133lbs   Time in AT/AF24.0 hr/day (100.0%)  Longest NB:[id number] months    Spoke with patient and reports feeling well at this time.  Denies fluid symptoms.     OptivolThoracic impedancesuggesting normal fluid levels.  Prescribed:  Furosemide80 mgtake 1 tabletevery morning and 40 mg every night (decreased 11/23 when Entresto started).  Potassium 20 mEq 1 tabletby mouth daily.  Labs: 01/31/2020 Creatinine 1.77, BUN 26, Potassium 4.0, Sodium 141, GFR 30 01/21/2020 Creatinine 2.13, BUN 35, Potassium 4.2, Sodium 135, GFR 24  03/21/2019 Creatinine2.20, BUN29, Potassium4.9, Sodium138, [id number] A complete set of results can be found in Results Review.  Recommendations:No changes and encouraged to call if experiencing any fluid symptoms.  Follow-up plan: ICM clinic phone appointment on3/15/2022. 91 day device clinic remote transmission3/14/2022.  EP/Cardiology Office Visits: 3/29/2022with Dr Aundra Dubin.   Copy of ICM check sent to Dr.Taylor.  3 month ICM trend: 03/31/2020.    1 Year ICM trend:       [name], RN 04/03/2020 2:30 PM

## 2020-04-15 ENCOUNTER — Other Ambulatory Visit: Payer: Self-pay | Admitting: Family Medicine

## 2020-04-15 DIAGNOSIS — E039 Hypothyroidism, unspecified: Secondary | ICD-10-CM | POA: Diagnosis not present

## 2020-04-16 LAB — TSH: TSH: 1.15 u[IU]/mL (ref 0.450–4.500)

## 2020-04-17 ENCOUNTER — Other Ambulatory Visit: Payer: Self-pay

## 2020-04-17 ENCOUNTER — Telehealth: Payer: Self-pay | Admitting: Cardiology

## 2020-04-17 ENCOUNTER — Encounter: Payer: Self-pay | Admitting: Family Medicine

## 2020-04-17 DIAGNOSIS — E039 Hypothyroidism, unspecified: Secondary | ICD-10-CM

## 2020-04-17 MED ORDER — LEVOTHYROXINE SODIUM 88 MCG PO TABS: 88.0000 ug | ORAL_TABLET | Freq: Every day | ORAL | 2 refills | Status: DC

## 2020-04-17 NOTE — Telephone Encounter (Signed)
Patient called would like to speak with Nurse about Rx from Dr Aundra Dubin. I did suggest she call that office but she wants know if her being dizzy, swimmy headed, light headed. She has appointment scheduled with Dr. Aundra Dubin but the soonest they could see her is March. She said she has had her Thyroid checked and that was good. Please return her call.

## 2020-04-17 NOTE — Telephone Encounter (Signed)
Patient calls nurse line requesting refill on synthroid as she has now completed TSH labs.   Informed patient that lab results was normal. Patient had additional questions regarding fatigue and "swimmy-headed" feeling when she wakes up in the morning.   Please advise.   Talbot Grumbling, RN

## 2020-04-17 NOTE — Telephone Encounter (Signed)
Patient most recently seen by Dr. Aundra Dubin - will forward to heart failure triage.

## 2020-04-19 ENCOUNTER — Other Ambulatory Visit (HOSPITAL_COMMUNITY): Payer: Self-pay | Admitting: Cardiology

## 2020-04-21 ENCOUNTER — Ambulatory Visit (INDEPENDENT_AMBULATORY_CARE_PROVIDER_SITE_OTHER): Payer: PPO | Admitting: *Deleted

## 2020-04-21 ENCOUNTER — Telehealth: Payer: Self-pay | Admitting: Cardiology

## 2020-04-21 DIAGNOSIS — I4891 Unspecified atrial fibrillation: Secondary | ICD-10-CM | POA: Diagnosis not present

## 2020-04-21 DIAGNOSIS — I513 Intracardiac thrombosis, not elsewhere classified: Secondary | ICD-10-CM | POA: Diagnosis not present

## 2020-04-21 DIAGNOSIS — Z5181 Encounter for therapeutic drug level monitoring: Secondary | ICD-10-CM

## 2020-04-21 LAB — POCT INR: INR: 2 (ref 2.0–3.0)

## 2020-04-21 NOTE — Patient Instructions (Signed)
Take 2 tablets tonight then resume 1 tablet daily except 2 tablets on Mondays and Thursdays Eat greens/salad tonight Recheck in 6 weeks

## 2020-04-21 NOTE — Telephone Encounter (Signed)
Has not been checking BP or HR at home due to not having a monitor. Reports daughter has BP monitor that will be given to her to check her home BP readings. Reports she feels dizzy when she gets up in the morning but feel better as the day goes on. Denies sob or chest pain. Advised to check BP daily for the next week especially when she is feeling dizzy, and call office with readings. Advised if she has a BP of 90/60 or lower, to call office sooner than one week. Advised to stay well hydrated. Advised that we could refill her entresto but she did need to keep her follow up in the [facility]. Scheduled follow up with Domenic Polite on 10/23/20 and Dr. Lovena Le 11/17/20. Verbalized understanding of plan.

## 2020-04-21 NOTE — Telephone Encounter (Signed)
Pt wants to know if Dr. Domenic Polite will take over her Rx for her Emily [name] instead of going to [facility] to see Dr. Aundra Dubin for just that one medication.

## 2020-04-21 NOTE — Telephone Encounter (Signed)
I reviewed the chart.  I met Emily [name] in September 2021, a former patient of Dr. Bronson Ing.  She was since evaluated by Dr. Aundra Dubin in the heart failure clinic in November 2021 at which point Emily [name] was started.  Has she been having dizziness since November 2021?  It looks like her recent device interrogations show good fluid status and stable weight.  There is no lower dose of Entresto, what has her blood pressure been running?  We can certainly refill the Entresto at 24/26 mg twice daily, but the question of whether is it is causing her dizziness is a separate issue and more information is needed.

## 2020-04-21 NOTE — Telephone Encounter (Signed)
Spoke to patient's daughter Otila Kluver who called this morning wanting to know if Dr.McDowell would refill Entresto instead of going to [place].She stated mother called several days ago to report she has been having dizziness and light headed every morning since she started taking Entresto 24/26 mg.Stated she had her thyroid medication changed at same time she started [place].She did not know which would be causing her dizziness.Advised I already sent a message to [place] asking if he would refill Entresto.I will send message to him for advice.

## 2020-04-21 NOTE — Telephone Encounter (Signed)
Spoke to patient's daughter she wanted to know if Dr.McDowell will refill Entresto prescribed by Dr.McLean so they will not have to go to [place].Advised I will send message to [place] for advice.

## 2020-04-28 ENCOUNTER — Telehealth: Payer: Self-pay | Admitting: Cardiology

## 2020-04-28 NOTE — Telephone Encounter (Signed)
With the exception of the 81/58 recording, most of the other blood pressures look okay.  We could consider trying to cut back her Lasix somewhat and keep her on the low-dose Entresto.  It looks like she has been on Lasix 80 mg in the morning and 40 mg in the afternoon, cut a.m. dose back to 60 mg.

## 2020-04-28 NOTE — Telephone Encounter (Signed)
BP readings: 04/21/2020 121/71 taken in the evening 04/22/2020 81/58 04/23/2020 104/64 04/24/2020 112/69 05-14-2020 139/82 sister passed away May 15, 2000 116/74 May 16, 2020 124/69 04/28/2020 116/65  Reports feeling dizzy and fatigue when she first get up in the mornings. Reports feeling better after she eats. Reports drinking plenty of fluids throughout the day. Denies swelling, chest pain or SOB.

## 2020-04-28 NOTE — Telephone Encounter (Signed)
Patient called stating that she is very fatigue with swimmy head. Patient is wanting to know if the Delene Loll could be causing this.  Please # [phone].

## 2020-04-29 MED ORDER — FUROSEMIDE 40 MG PO TABS
ORAL_TABLET | ORAL | 0 refills | Status: DC
Start: 2020-04-29 — End: 2020-05-26

## 2020-04-29 NOTE — Telephone Encounter (Signed)
Patient's daughter, Waynetta Sandy informed and verbalized understanding of plan.

## 2020-05-01 ENCOUNTER — Encounter (HOSPITAL_COMMUNITY): Payer: PPO | Admitting: Cardiology

## 2020-05-11 ENCOUNTER — Telehealth: Payer: Self-pay

## 2020-05-11 ENCOUNTER — Ambulatory Visit (INDEPENDENT_AMBULATORY_CARE_PROVIDER_SITE_OTHER): Payer: PPO

## 2020-05-11 DIAGNOSIS — I4821 Permanent atrial fibrillation: Secondary | ICD-10-CM

## 2020-05-11 NOTE — Telephone Encounter (Signed)
Patient calls nurse line requesting to speak with Dr. Jeani Hawking directly regarding thyroid. Patient states that she was doing well on regimen that included synthroid 75 mg on every day except for Wednesday and on Wednesday taking 50 mg. Now patient is taking 88 mg daily and "is having a hard time getting started in the morning". Advised patient that provider is requesting that she schedule follow up appointment. Patient is hesitant due to high gas prices. Patient is requesting phone call or possible virtual visit to discuss with provider.   To PCP  Talbot Grumbling, RN

## 2020-05-12 ENCOUNTER — Telehealth: Payer: Self-pay | Admitting: Family Medicine

## 2020-05-12 ENCOUNTER — Ambulatory Visit (INDEPENDENT_AMBULATORY_CARE_PROVIDER_SITE_OTHER): Payer: PPO

## 2020-05-12 ENCOUNTER — Other Ambulatory Visit (HOSPITAL_COMMUNITY): Payer: Self-pay | Admitting: Cardiology

## 2020-05-12 DIAGNOSIS — Z95810 Presence of automatic (implantable) cardiac defibrillator: Secondary | ICD-10-CM

## 2020-05-12 DIAGNOSIS — E039 Hypothyroidism, unspecified: Secondary | ICD-10-CM

## 2020-05-12 DIAGNOSIS — I5022 Chronic systolic (congestive) heart failure: Secondary | ICD-10-CM

## 2020-05-12 MED ORDER — LEVOTHYROXINE SODIUM 50 MCG PO TABS: 50.0000 ug | ORAL_TABLET | Freq: Every day | ORAL | 11 refills | Status: DC

## 2020-05-12 MED ORDER — LEVOTHYROXINE SODIUM 75 MCG PO TABS: 75.0000 ug | ORAL_TABLET | Freq: Every day | ORAL | 11 refills | Status: DC

## 2020-05-12 NOTE — Progress Notes (Signed)
EPIC Encounter for ICM Monitoring  Patient Name: Emily [name] is a 70 y.o. female Date: 05/12/2020 Primary Care Physican: Ezequiel Essex, MD Primary Cardiologist:McDowell/McLean Electrophysiologist:Taylor Bi-V Pacing:93% 3/15/2022Weight: 135.6lbs   Time in AT/AF24.0 hr/day (100.0%)  Longest AT/AF60month    Spoke with patient and reports feeling well at this time. Denies fluid symptoms.    OptivolThoracic impedancesuggesting normal fluid levels.  Prescribed:  Furosemide40 mgTake 1.5 tablets (60 mg total) by mouth in the morning AND 1 tablet (40 mg total) every evening.  Potassium 20 mEq 1 tabletby mouth daily.  Labs: 01/31/2020 Creatinine1.77, BLaurey Morale Sodium141, GFR30 01/21/2020 Creatinine2.13, BUN35, Potassium4.2, Sodium135, [id number]/21/2021 Creatinine2.20, BUN29, Potassium4.9, [id number] complete set of results can be found in Results Review.  Recommendations:No changes and encouraged to call if experiencing any fluid symptoms.  Follow-up plan: ICM clinic phone appointment on4/18/2022. 91 day device clinic remote transmission6/13/2022.  EP/Cardiology Office Visits: 3/29/2022with Dr MAundra Dubin 10/23/2020 with Dr MDomenic Polite  Copy of ICM check sent to Dr.Taylor.   3 month ICM trend: 05/11/2020.    1 Year ICM trend:       LRosalene Billings RN 05/12/2020 1:34 PM

## 2020-05-12 NOTE — Telephone Encounter (Signed)
Spoke with Ms. Coale regarding her recent call to the nurse line regarding concerns about her thyroid medication. Last TSH recheck (February) was normal. At that time, her thyroid regimen was 75 mcg daily with the exception of 50 mcg on Wednesdays. In February, we changed her thyroid medication to 88 mcg daily in an attempt to simplify her routine.   Ms. Rusnak reports that in the last month, she finds it harder to get up and moving in the morning, and has more episodes of "swimmy-headedness". She reports that when her thyroid "gets messed up" her heart is affected. Says that her previous heart ablations were precipitated by thyroid abnormalities. Is followed closely by her cardiologist. Reports gaining 3 pounds in the last month. Denies increased SOB, swelling of hands or feet, or fatigue throughout the day.   Will prescribe thyroid at her old regimen as above. Gave strict return precautions for sudden changes. Advised that she can be seen same-day or next day by any resident in the clinic.   Ezequiel Essex, MD

## 2020-05-13 LAB — CUP PACEART REMOTE DEVICE CHECK
Battery Remaining Longevity: 11 mo
Battery Voltage: 2.87 V
Brady Statistic RA Percent Paced: 0.01 %
Brady Statistic RV Percent Paced: 92.96 %
Date Time Interrogation Session: 20220314012403
HighPow Impedance: 76 Ohm
Implantable Lead Implant Date: 20050218
Implantable Lead Implant Date: 20050218
Implantable Lead Implant Date: 20050218
Implantable Lead Location: 753858
Implantable Lead Location: 753859
Implantable Lead Location: 753860
Implantable Lead Model: 4194
Implantable Lead Model: 5076
Implantable Lead Model: 6947
Implantable Pulse Generator Implant Date: 20160413
Lead Channel Impedance Value: 1064 Ohm
Lead Channel Impedance Value: 418 Ohm
Lead Channel Impedance Value: 456 Ohm
Lead Channel Impedance Value: 513 Ohm
Lead Channel Impedance Value: 646 Ohm
Lead Channel Impedance Value: 722 Ohm
Lead Channel Pacing Threshold Amplitude: 0.75 V
Lead Channel Pacing Threshold Amplitude: 0.875 V
Lead Channel Pacing Threshold Amplitude: 1 V
Lead Channel Pacing Threshold Pulse Width: 0.4 ms
Lead Channel Pacing Threshold Pulse Width: 0.4 ms
Lead Channel Pacing Threshold Pulse Width: 0.4 ms
Lead Channel Sensing Intrinsic Amplitude: 0.5 mV
Lead Channel Sensing Intrinsic Amplitude: 0.5 mV
Lead Channel Sensing Intrinsic Amplitude: 4.125 mV
Lead Channel Sensing Intrinsic Amplitude: 4.125 mV
Lead Channel Setting Pacing Amplitude: 2 V
Lead Channel Setting Pacing Amplitude: 2.25 V
Lead Channel Setting Pacing Amplitude: 2.5 V
Lead Channel Setting Pacing Pulse Width: 0.4 ms
Lead Channel Setting Pacing Pulse Width: 0.4 ms
Lead Channel Setting Sensing Sensitivity: 0.3 mV

## 2020-05-20 NOTE — Progress Notes (Signed)
Remote ICD transmission.   

## 2020-05-26 ENCOUNTER — Other Ambulatory Visit: Payer: Self-pay

## 2020-05-26 ENCOUNTER — Telehealth (HOSPITAL_COMMUNITY): Payer: Self-pay | Admitting: Pharmacy Technician

## 2020-05-26 ENCOUNTER — Ambulatory Visit (HOSPITAL_COMMUNITY)
Admission: RE | Admit: 2020-05-26 | Discharge: 2020-05-26 | Disposition: A | Discharge status: Home / Self Care | Payer: PPO | Source: Ambulatory Visit | Attending: Cardiology | Admitting: Cardiology

## 2020-05-26 ENCOUNTER — Encounter (HOSPITAL_COMMUNITY): Payer: Self-pay | Admitting: Cardiology

## 2020-05-26 VITALS — BP 100/60 | HR 88 | Wt 136.2 lb

## 2020-05-26 DIAGNOSIS — Z79890 Hormone replacement therapy (postmenopausal): Secondary | ICD-10-CM | POA: Diagnosis not present

## 2020-05-26 DIAGNOSIS — R944 Abnormal results of kidney function studies: Secondary | ICD-10-CM | POA: Insufficient documentation

## 2020-05-26 DIAGNOSIS — I514 Myocarditis, unspecified: Secondary | ICD-10-CM | POA: Insufficient documentation

## 2020-05-26 DIAGNOSIS — Z7984 Long term (current) use of oral hypoglycemic drugs: Secondary | ICD-10-CM | POA: Insufficient documentation

## 2020-05-26 DIAGNOSIS — I428 Other cardiomyopathies: Secondary | ICD-10-CM | POA: Insufficient documentation

## 2020-05-26 DIAGNOSIS — E1122 Type 2 diabetes mellitus with diabetic chronic kidney disease: Secondary | ICD-10-CM | POA: Insufficient documentation

## 2020-05-26 DIAGNOSIS — Z95810 Presence of automatic (implantable) cardiac defibrillator: Secondary | ICD-10-CM | POA: Insufficient documentation

## 2020-05-26 DIAGNOSIS — Z794 Long term (current) use of insulin: Secondary | ICD-10-CM | POA: Diagnosis not present

## 2020-05-26 DIAGNOSIS — I4821 Permanent atrial fibrillation: Secondary | ICD-10-CM | POA: Diagnosis not present

## 2020-05-26 DIAGNOSIS — Z8249 Family history of ischemic heart disease and other diseases of the circulatory system: Secondary | ICD-10-CM | POA: Diagnosis not present

## 2020-05-26 DIAGNOSIS — N184 Chronic kidney disease, stage 4 (severe): Secondary | ICD-10-CM | POA: Insufficient documentation

## 2020-05-26 DIAGNOSIS — Z7901 Long term (current) use of anticoagulants: Secondary | ICD-10-CM | POA: Insufficient documentation

## 2020-05-26 DIAGNOSIS — Z79899 Other long term (current) drug therapy: Secondary | ICD-10-CM | POA: Diagnosis not present

## 2020-05-26 DIAGNOSIS — I080 Rheumatic disorders of both mitral and aortic valves: Secondary | ICD-10-CM | POA: Diagnosis not present

## 2020-05-26 DIAGNOSIS — I5022 Chronic systolic (congestive) heart failure: Secondary | ICD-10-CM | POA: Insufficient documentation

## 2020-05-26 HISTORY — DX: Heart failure, unspecified: I50.9

## 2020-05-26 LAB — BASIC METABOLIC PANEL
Anion gap: 11 (ref 5–15)
BUN: 40 mg/dL — ABNORMAL HIGH (ref 8–23)
CO2: 24 mmol/L (ref 22–32)
Calcium: 8.9 mg/dL (ref 8.9–10.3)
Chloride: 102 mmol/L (ref 98–111)
Creatinine, Ser: 2.32 mg/dL — ABNORMAL HIGH (ref 0.44–1.00)
GFR, Estimated: 22 mL/min — ABNORMAL LOW (ref >60)
Glucose, Bld: 143 mg/dL — ABNORMAL HIGH (ref 70–99)
Potassium: 4.8 mmol/L (ref 3.5–5.1)
Sodium: 137 mmol/L (ref 135–145)

## 2020-05-26 MED ORDER — EMPAGLIFLOZIN 10 MG PO TABS: 10.0000 mg | ORAL_TABLET | Freq: Every day | ORAL | 11 refills | Status: DC

## 2020-05-26 MED ORDER — FUROSEMIDE 40 MG PO TABS
80.0000 mg | ORAL_TABLET | Freq: Every day | ORAL | 11 refills | Status: DC
Start: 2020-05-26 — End: 2020-05-27

## 2020-05-26 NOTE — Progress Notes (Signed)
PCP: Ezequiel Essex, MD Cardiology: Dr. Domenic Polite HF Cardiology: Dr. Aundra Dubin  70 y.o. with history of CKD stage IV, permanent atrial fibrillation, and a long-standing nonischemic cardiomyopathy was referred by Dr. Domenic Polite for CHF clinic evaluation.  She first developed cardiac problems in 1988.  At that time, she was admitted with CHF and eventually underwent myocardial biopsy at [facility], per her report showing myocarditis.  Patient states that her EF has been low since then.  She had MDT CRT-D device implanted.  Echo in 7/21 showed EF 20-25%, moderate MR, mild AS. She is now in permanent atrial fibrillation and is s/p AV nodal ablation with BiV pacing. Elevated creatinine has limited medication management.    She returns now for followup of CHF.  Initially after starting Entresto, she was very lightheaded early in the morning.  However, this has improved.  She still gets mildly lightheaded when standing from lying down in the early morning.  She has fatigue, especially in the morning.  However, minimal dyspnea.  She is able to vacuum and does all the housework including cooking without much problem.  She is short of breath walking up stairs, no problems on flat ground.  No chest pain.  No falls. No orthopnea/PND.    Labs (1/21): LDL 118 Labs (9/21): K 4.4, creatinine 1.93 Labs (12/21): K 4, creatinine 1.77 Labs (2/22): TSH normal  Medtronic device interrogation: Chronic atrial fibrillation, stable thoracic impedance.   PMH: 1. Gout 2. Complete heart block: Medtronic CRT device.  3. CKD stage IV: Diabetic nephropathy.  4. Type 2 diabetes 5. Atrial fibrillation: Now permanent.   - AF ablation 4/18.  - AV nodal ablation in 2019 with MDT CRT-D.  6. Remote LV thrombus 7. Chronic systolic CHF: Nonischemic cardiomyopathy. Medtronic CRT-D device.  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  - LHC 2004: Nonobstructive mild CAD.  - Echo (8/19): EF 10-15%, mildly dilated  LV, mild-moderately dilated RV with mild-moderately decreased systolic function.  - Echo (7/21): EF 20-25%, low normal RV function, moderate MR, mild AS.  8. H/o cholecystectomy 9. Hypothyroidism 10. 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  No smoking or ETOH.   Family History  Problem Relation Age of Onset  . Hip fracture Mother   . COPD Father   . CAD Sister   . Heart attack Sister    ROS: All systems reviewed and negative except as per HPI.   Current Outpatient Medications  Medication Sig Dispense Refill  . acetaminophen (TYLENOL) 500 MG tablet Take 1,000 mg by mouth every 6 (six) hours as needed for mild pain.    . Calcium Carb-Cholecalciferol (CALCIUM 600-D PO) Take 1 tablet by mouth daily.     . empagliflozin (JARDIANCE) 10 MG TABS tablet Take 1 tablet (10 mg total) by mouth daily before breakfast. 30 tablet 11  . Glucosamine-Chondroitin (OSTEO BI-FLEX REGULAR STRENGTH PO) Take 1 tablet by mouth daily.    Marland Kitchen levothyroxine (SYNTHROID) 50 MCG tablet Take 1 tablet (50 mcg total) by mouth daily. Take once weekly on Wednesday. 30 tablet 11  . levothyroxine (SYNTHROID) 75 MCG tablet Take 1 tablet (75 mcg total) by mouth daily. Take daily except for Wednesday. 30 tablet 11  . metoprolol succinate (TOPROL-XL) 50 MG 24 hr tablet TAKE 1 TABLET BY MOUTH ONCE DAILY. 30 tablet 6  . Omega-3 Fatty Acids (FISH OIL PO) Take 1,000 mg by mouth daily.     Marland Kitchen omeprazole (PRILOSEC) 40 MG  capsule TAKE 1 CAPSULE BY MOUTH ONCE DAILY. 30 capsule 0  . potassium chloride (KLOR-CON) 20 MEQ packet Take 20 mEq by mouth every other day.    . sacubitril-valsartan (ENTRESTO) 24-26 MG Take 1 tablet by mouth 2 (two) times daily. Needs doctor appointment for further refill. 120 tablet 0  . simvastatin (ZOCOR) 20 MG tablet Take 1 tablet (20 mg total) by mouth daily. 30 tablet 6  . TOUJEO SOLOSTAR 300 UNIT/ML Solostar Pen INJECT 24 UNITS INTO THE SKIN DAILY. 4.5 mL 11  . warfarin (COUMADIN)  2 MG tablet Take 1 tablet daily except 2 tablets on Mondays and Thursdays 40 tablet 6  . furosemide (LASIX) 40 MG tablet Take 2 tablets (80 mg total) by mouth daily. 60 tablet 11   No current facility-administered medications for this encounter.   BP 100/60   Pulse 88   Wt 61.8 kg (136 lb 3.2 oz)   SpO2 99%   BMI 24.13 kg/m  General: NAD Neck: No JVD, no thyromegaly or thyroid nodule.  Lungs: Clear to auscultation bilaterally with normal respiratory effort. CV: Nondisplaced PMI.  Heart regular S1/S2, no S3/S4, no murmur.  No peripheral edema.  No carotid bruit.  Normal pedal pulses.  Abdomen: Soft, nontender, no hepatosplenomegaly, no distention.  Skin: Intact without lesions or rashes.  Neurologic: Alert and oriented x 3.  Psych: Normal affect. Extremities: No clubbing or cyanosis.  HEENT: Normal.   Assessment/Plan: 1. Chronic systolic CHF: Nonischemic cardiomyopathy.  Low EF for years.  Per patient's report, she had an endomyocardial biopsy at [facility] in 1988 showing myocarditis.  Medication titration has been limited by CKD stage IV. MDT CRT-D device.   Echo in 7/21 showed EF 20-25%, moderate MR, mild AS.  On exam and by Optivol, she is not volume overloaded.  NYHA class II symptoms, stable.  BP 100/60, still mildly orthostatic at times in the early am. Last creatinine somewhat improved at 1.77.  - Decrease Lasix to 80 mg daily and start Farxiga 10 mg daily.  BMET today and 10 days, can follow volume status remotely on her device (can increase Lasix if she appears to retain fluid).   - Continue Entresto 24/26 bid.  - Continue Toprol XL 50 mg daily.  - Continue medication titration based on creatinine response.  - I will arrange for CPX.  2.  Atrial fibrillation: Permanent.  S/p AV nodal ablation with BiV pacing.   - Continue warfarin.  3. CKD: Stage IV.  Follow closely with medication adjustment.   Followup in 3 wks with HF pharmacist for med titration, see me in 6 wks.   Loralie Champagne 05/26/2020

## 2020-05-26 NOTE — Patient Instructions (Addendum)
Labs done today. We will contact you only if your labs are abnormal.  START Jardiance '10mg'$  (1 tablet) by mouth daily.  DECREASE Lasix to '80mg'$  (2 tablets) by mouth daily.  No other medication changes were made. Please continue all current medications as prescribed.  Your physician has recommended that you have a cardiopulmonary stress test (CPX). CPX testing is a non-invasive measurement of heart and lung function. It replaces a traditional treadmill stress test. This type of test provides a tremendous amount of information that relates not only to your present condition but also for future outcomes. This test combines measurements of you ventilation, respiratory gas exchange in the lungs, electrocardiogram (EKG), blood pressure and physical response before, during, and following an exercise protocol.  Your physician recommends that you schedule a follow-up appointment soon for a CPX test, 10 days for a lab only appointment in [place], 3 weeks with our Clinic Pharmacist and in 6 weeks with Dr. Aundra Dubin.   If you have any questions or concerns before your next appointment please send [place] a message through [place] or call our office at [phone].    TO LEAVE A MESSAGE FOR THE NURSE SELECT OPTION 2, PLEASE LEAVE A MESSAGE INCLUDING: . YOUR NAME . DATE OF BIRTH . CALL BACK NUMBER . REASON FOR CALL**this is important as we prioritize the call backs  YOU WILL RECEIVE A CALL BACK THE SAME DAY AS LONG AS YOU CALL BEFORE 4:00 PM   Do the following things EVERYDAY: 1) Weigh yourself in the morning before breakfast. Write it down and keep it in a log. 2) Take your medicines as prescribed 3) Eat low salt foods--Limit salt (sodium) to 2000 mg per day.  4) Stay as active as you can everyday 5) Limit all fluids for the day to less than 2 liters   At the [facility], you and your health needs are our priority. As part of our continuing mission to provide you with exceptional heart  care, we have created designated Provider Care Teams. These Care Teams include your primary Cardiologist (physician) and Advanced Practice Providers (APPs- Physician Assistants and Nurse Practitioners) who all work together to provide you with the care you need, when you need it.   You may see any of the following providers on your designated Care Team at your next follow up: Marland Kitchen Dr Glori Bickers . Dr Loralie Champagne . Darrick Grinder, NP . Lyda Jester, PA . Audry Riles, PharmD   Please be sure to bring in all your medications bottles to every appointment.

## 2020-05-26 NOTE — Telephone Encounter (Signed)
Patient was seen in clinic today and started on Jardiance. The current 30 day co-pay is $45, which is a barrier. Started an application for [employer]. Patient also stated that she could not afford Entresto either. Started an [place] for [employer] assistance as well.  Will fax in once signatures obtained.

## 2020-05-27 ENCOUNTER — Other Ambulatory Visit (HOSPITAL_COMMUNITY): Payer: Self-pay

## 2020-05-27 DIAGNOSIS — I5022 Chronic systolic (congestive) heart failure: Secondary | ICD-10-CM

## 2020-05-27 MED ORDER — FUROSEMIDE 40 MG PO TABS
60.0000 mg | ORAL_TABLET | Freq: Every day | ORAL | 11 refills | Status: DC
Start: 2020-05-27 — End: 2020-06-26

## 2020-05-27 NOTE — Telephone Encounter (Signed)
Sent in both applications via fax.  Will follow up.  

## 2020-05-27 NOTE — Progress Notes (Signed)
Patient advised and verbalized understanding. Patient will have labs done in [place]. Med list updated to reflect changes.   Meds ordered this encounter  Medications  . furosemide (LASIX) 40 MG tablet    Sig: Take 1.5 tablets (60 mg total) by mouth daily.    Dispense:  45 tablet    Refill:  11    Please cancel all previous orders for current medication. Change in dosage or pill size.   Orders Placed This Encounter  Procedures  . Basic metabolic panel    Standing Status:   Future    Standing Expiration Date:   05/27/2021    Order Specific Question:   Release to patient    Answer:   Immediate

## 2020-05-29 ENCOUNTER — Telehealth (HOSPITAL_COMMUNITY): Payer: Self-pay | Admitting: Pharmacy Technician

## 2020-05-29 NOTE — Telephone Encounter (Signed)
Received communication from [facility] that they would like for the patient to apply for and be denied LIS. If the patient is denied for LIS assistance, we would send that letter to [facility] in order to get an approval.  Donzetta Starch (CSW), a request to contact the patient in order to complete the application.   Still waiting to hear back on her [employer] application.

## 2020-06-02 ENCOUNTER — Other Ambulatory Visit: Payer: Self-pay

## 2020-06-02 ENCOUNTER — Ambulatory Visit (INDEPENDENT_AMBULATORY_CARE_PROVIDER_SITE_OTHER): Payer: PPO | Admitting: *Deleted

## 2020-06-02 ENCOUNTER — Telehealth (HOSPITAL_COMMUNITY): Payer: Self-pay | Admitting: Licensed Clinical Social Worker

## 2020-06-02 DIAGNOSIS — I4891 Unspecified atrial fibrillation: Secondary | ICD-10-CM | POA: Diagnosis not present

## 2020-06-02 DIAGNOSIS — Z5181 Encounter for therapeutic drug level monitoring: Secondary | ICD-10-CM

## 2020-06-02 DIAGNOSIS — I513 Intracardiac thrombosis, not elsewhere classified: Secondary | ICD-10-CM

## 2020-06-02 LAB — POCT INR: INR: 2.4 (ref 2.0–3.0)

## 2020-06-02 NOTE — Telephone Encounter (Signed)
CSW consulted to assist pt with Extra Help application.  CSW called pt and explained need to apply for Extra Help so that we can provide approval or denial letter to [facility].  Pt is out of the house right now but is agreeable to applying with CSW over the phone- will plan to call again tomorrow  Will continue to follow and assist as needed  Jorge Ny, [place] Worker [facility] Desk#: [phone] Cell#: [phone]

## 2020-06-02 NOTE — Patient Instructions (Signed)
Continue warfarin 1 tablet daily except 2 tablets on Mondays and Thursdays Eat greens/salad tonight Recheck in 6 weeks

## 2020-06-03 ENCOUNTER — Telehealth (HOSPITAL_COMMUNITY): Payer: Self-pay | Admitting: Licensed Clinical Social Worker

## 2020-06-03 NOTE — Telephone Encounter (Signed)
CSW called pt to follow up regarding applying for Extra Help program so we can move forward with [employer] patient assistance to get help with Jardiance.  Patient is not comfortable sharing the personal information that Extra Help application requires (SS number, financial information) so is not agreeable to pursuing at this time.  States that she received a call that she was approved to get [place] through [employer] yesterday so is no longer worried about getting help with Jardiance at this time - feels as if they can manage the cost.  No further needs at this time.  Jorge Ny, LCSW Clinical Social Worker [facility] Desk#: [phone] Cell#: [phone]

## 2020-06-08 ENCOUNTER — Telehealth (HOSPITAL_COMMUNITY): Payer: Self-pay | Admitting: Pharmacy Technician

## 2020-06-08 NOTE — Telephone Encounter (Signed)
Advanced Heart Failure Patient Advocate Encounter   Patient was approved to receive Entresto from [employer]  Patient ID: [id number] Effective dates: 06/01/20 through 02/27/21  Called and spoke with the patient.   Charlann Boxer, CPhT

## 2020-06-09 DIAGNOSIS — I5022 Chronic systolic (congestive) heart failure: Secondary | ICD-10-CM | POA: Diagnosis not present

## 2020-06-10 ENCOUNTER — Other Ambulatory Visit (HOSPITAL_COMMUNITY): Payer: Self-pay | Admitting: Cardiology

## 2020-06-10 LAB — BASIC METABOLIC PANEL
BUN/Creatinine Ratio: 13 (ref 12–28)
BUN: 31 mg/dL — ABNORMAL HIGH (ref 8–27)
CO2: 19 mmol/L — ABNORMAL LOW (ref 20–29)
Calcium: 8.6 mg/dL — ABNORMAL LOW (ref 8.7–10.3)
Chloride: 100 mmol/L (ref 96–106)
Creatinine, Ser: 2.41 mg/dL — ABNORMAL HIGH (ref 0.57–1.00)
Glucose: 135 mg/dL — ABNORMAL HIGH (ref 65–99)
Potassium: 4.8 mmol/L (ref 3.5–5.2)
Sodium: 139 mmol/L (ref 134–144)
eGFR: 21 mL/min/{1.73_m2} — ABNORMAL LOW (ref >59)

## 2020-06-11 ENCOUNTER — Telehealth (HOSPITAL_COMMUNITY): Payer: Self-pay | Admitting: *Deleted

## 2020-06-11 DIAGNOSIS — I5022 Chronic systolic (congestive) heart failure: Secondary | ICD-10-CM

## 2020-06-11 NOTE — Telephone Encounter (Signed)
-----   Message from Larey Dresser, MD sent at 06/10/2020  4:29 PM EDT ----- Decrease Lasix to 60 mg daily, repeat BMET 1 week.

## 2020-06-11 NOTE — Telephone Encounter (Signed)
Emily [name], [place]  06/11/2020 2:15 PM EDT Back to Top     Pt aware and lab appt scheduled.    Larey Dresser, MD  06/10/2020 4:29 PM EDT      Decrease Lasix to 60 mg daily, repeat BMET 1 week.

## 2020-06-15 ENCOUNTER — Ambulatory Visit (INDEPENDENT_AMBULATORY_CARE_PROVIDER_SITE_OTHER): Payer: PPO

## 2020-06-15 DIAGNOSIS — I5022 Chronic systolic (congestive) heart failure: Secondary | ICD-10-CM | POA: Diagnosis not present

## 2020-06-15 DIAGNOSIS — Z95810 Presence of automatic (implantable) cardiac defibrillator: Secondary | ICD-10-CM

## 2020-06-15 NOTE — Progress Notes (Signed)
EPIC Encounter for ICM Monitoring  Patient Name: Emily [name] is a 70 y.o. female Date: 06/15/2020 Primary Care Physican: Ezequiel Essex, MD Primary Cardiologist:McDowell/McLean Electrophysiologist:Taylor Bi-V Pacing:88.6% 4/18/2022Weight: 136lbs (baseline 134 lbs)   Time in AT/AF24.0 hr/day (100.0%)  Longest AT/AF71month  AT/AF >= 6 hr for 20 days.  Battery ERI:  11 months    Spoke with patient and reports weight gain of 2-3 lbs in the last few days.     OptivolThoracic impedancesuggesting possible fluid accumulation since 06/09/2020.  Prescribed:  Furosemide40 mgTake 1.5 tablets (60 mg total) by mouth daily (decreased 05/27/2020).  Potassium 20 mEq 1 tabletby mouth every other day.  Labs: BMET scheduled for 06/22/2020 06/09/2020 Creatinine 2.41, BUN 31, Potassium 4.8, Sodium 139 05/26/2020 Creatinine 2.32, BUN 40, Potassium 4.8, Sodium 137, GFR 22  A complete set of results can be found in Results Review.  Recommendations: Copy sent to Dr MAundra Dubinfor review and recommendations if needed.  Follow-up plan: ICM clinic phone appointment on4/25/2022 (manual) to recheck fluid levels. 91 day device clinic remote transmission6/13/2022.  EP/Cardiology Office Visits: 5/20/2022with Dr MAundra Dubin 10/23/2020 with Dr MDomenic Polite  Copy of ICM check sent to Dr.Taylor.   3 month ICM trend: 06/15/2020.    1 Year ICM trend:       LRosalene Billings RN 06/15/2020 11:11 AM

## 2020-06-15 NOTE — Progress Notes (Signed)
Increase Lasix to 80 mg daily x 3 days then back to 60 mg daily.

## 2020-06-16 ENCOUNTER — Other Ambulatory Visit (HOSPITAL_COMMUNITY): Payer: Self-pay | Admitting: Cardiology

## 2020-06-16 NOTE — Progress Notes (Signed)
Spoke with patient.  Advised Dr Aundra Dubin ordered to take Furosemide 80 mg daily x 3 days and then return to 60 mg daily.  She verbalized understanding and agreed with plan.  Advised to call back if any changes in condition and next remote transmission 06/22/2020 to recheck fluid levels.

## 2020-06-22 ENCOUNTER — Encounter (HOSPITAL_COMMUNITY): Payer: PPO

## 2020-06-22 ENCOUNTER — Telehealth: Payer: Self-pay

## 2020-06-22 ENCOUNTER — Other Ambulatory Visit (HOSPITAL_COMMUNITY): Payer: PPO

## 2020-06-22 NOTE — Telephone Encounter (Signed)
Received patient call.  She is unable send remote transmission today for fluid level review due to monitoring is not working.  [employer] is sending a new monitor.  Once she receives the monitor in 7-10 days she will send updated remote transmission.  ICM Remote Transmission rescheduled for 07/06/2020

## 2020-06-25 ENCOUNTER — Encounter (HOSPITAL_COMMUNITY): Payer: PPO

## 2020-06-25 ENCOUNTER — Other Ambulatory Visit (HOSPITAL_COMMUNITY): Payer: PPO

## 2020-06-25 ENCOUNTER — Telehealth (HOSPITAL_COMMUNITY): Payer: Self-pay | Admitting: *Deleted

## 2020-06-25 NOTE — Telephone Encounter (Signed)
Lab order [facility] to [facility] in [place] [address]  Fax # [phone]

## 2020-06-26 ENCOUNTER — Ambulatory Visit (INDEPENDENT_AMBULATORY_CARE_PROVIDER_SITE_OTHER): Payer: PPO

## 2020-06-26 ENCOUNTER — Other Ambulatory Visit: Payer: Self-pay | Admitting: Cardiology

## 2020-06-26 DIAGNOSIS — Z95810 Presence of automatic (implantable) cardiac defibrillator: Secondary | ICD-10-CM

## 2020-06-26 DIAGNOSIS — I5022 Chronic systolic (congestive) heart failure: Secondary | ICD-10-CM

## 2020-06-26 DIAGNOSIS — Z7689 Persons encountering health services in other specified circumstances: Secondary | ICD-10-CM | POA: Diagnosis not present

## 2020-06-26 MED ORDER — FUROSEMIDE 40 MG PO TABS
ORAL_TABLET | ORAL | 3 refills | Status: DC
Start: 2020-06-26 — End: 2020-07-06

## 2020-06-26 NOTE — Progress Notes (Signed)
EPIC Encounter for ICM Monitoring  Patient Name: Emily [name] is a 70 y.o. female Date: 06/26/2020 Primary Care Physican: Ezequiel Essex, MD Primary Cardiologist:McDowell/McLean Electrophysiologist:Taylor Bi-V Pacing:91.4% 4/18/2022Weight: 136lbs (baseline 134 lbs) 06/26/2020 Weight: 134-135 lbs   Time in AT/AF24.0 hr/day (100.0%)  Longest AT/AF34month  Battery ERI:  10 months    Spoke with patient and she reports weight has returned to baseline.  She confirmed she took Furosemide 80 mg x 3 days and resumed 60 mg daily.     OptivolThoracic impedancesuggesting possible fluid accumulation continues (since 06/09/2020) after taking increased Furosemide dosage of 80 mg daily x 3 days.     Prescribed:  Furosemide40 mgTake 1.5 tablets (60 mg total) by mouth daily (decreased 05/27/2020).  Potassium 20 mEq 1 tabletby mouth every other day.  Labs: BMET scheduled for 06/26/2020 06/09/2020 Creatinine 2.41, BUN 31, Potassium 4.8, Sodium 139 05/26/2020 Creatinine 2.32, BUN 40, Potassium 4.8, Sodium 137, GFR 22  A complete set of results can be found in Results Review.  Recommendations: Copy sent to Dr MAundra Dubinfor review after patient took extra Furosemide of 80 mg as instructed.    Follow-up plan: ICM clinic phone appointment on5/04/2020 to recheck fluid levels. 91 day device clinic remote transmission6/13/2022.  EP/Cardiology Office Visits: 5/20/2022with Dr MAundra Dubin 10/23/2020 with Dr MDomenic Polite  Copy of ICM check sent to Dr.Taylor.  3 month ICM trend: 06/26/2020.    1 Year ICM trend:       LRosalene Billings RN 06/26/2020 9:32 AM

## 2020-06-26 NOTE — Progress Notes (Signed)
Increase Lasix to 60 qam/40 qpm.  BMET 1 week.

## 2020-06-26 NOTE — Progress Notes (Signed)
Spoke with patient.  Advised Dr Aundra Dubin recommended to increase Furosemide to 60 mg every morning and 40 mg every afternoon.  She agreed to [facility] lab in a week and has it drawn at [facility] on [address] in [place].  Will fax BMET Lab order.  She has Furosemide supply on hand and will call pharmacy will refill is needed.

## 2020-06-27 LAB — BASIC METABOLIC PANEL
BUN/Creatinine Ratio: 12 (ref 12–28)
BUN: 23 mg/dL (ref 8–27)
CO2: 22 mmol/L (ref 20–29)
Calcium: 8.7 mg/dL (ref 8.7–10.3)
Chloride: 102 mmol/L (ref 96–106)
Creatinine, Ser: 1.99 mg/dL — ABNORMAL HIGH (ref 0.57–1.00)
Glucose: 171 mg/dL — ABNORMAL HIGH (ref 65–99)
Potassium: 5.5 mmol/L — ABNORMAL HIGH (ref 3.5–5.2)
Sodium: 140 mmol/L (ref 134–144)
eGFR: 26 mL/min/{1.73_m2} — ABNORMAL LOW (ref >59)

## 2020-06-27 LAB — SPECIMEN STATUS REPORT

## 2020-06-29 ENCOUNTER — Ambulatory Visit (HOSPITAL_COMMUNITY): Payer: PPO

## 2020-06-30 ENCOUNTER — Ambulatory Visit (INDEPENDENT_AMBULATORY_CARE_PROVIDER_SITE_OTHER): Payer: PPO

## 2020-06-30 DIAGNOSIS — I5022 Chronic systolic (congestive) heart failure: Secondary | ICD-10-CM

## 2020-06-30 DIAGNOSIS — Z95810 Presence of automatic (implantable) cardiac defibrillator: Secondary | ICD-10-CM

## 2020-06-30 NOTE — Progress Notes (Signed)
EPIC Encounter for ICM Monitoring  Patient Name: Emily [name] is a 70 y.o. female Date: 06/30/2020 Primary Care Physican: Ezequiel Essex, MD Primary Cardiologist:McDowell/McLean Electrophysiologist:Taylor Bi-V Pacing:95.4% 4/18/2022Weight: 136lbs(baseline 134 lbs) 06/26/2020 Weight: 134-135 lbs 06/30/2020 Weight: 134 lbs   Time in AT/AF24.0 hr/day (100.0%)  Longest AT/AF81month  Battery ERI: 10 months    Spoke with patient and she reports weight has returned to baseline.   OptivolThoracic impedancesuggestingfluid levels returned to normal after Furosemide increased to twice a day.     Prescribed:  Furosemide40 mgTake 1.5 tablets (60 mg total) by mouthevery morning and 1 tablet (40 mg total) every afternoon.  Potassium 20 mEq 1 tabletby mouthevery other day.  Labs:BMET scheduled for 07/03/2020 06/09/2020 Creatinine2.41, BUN31, Potassium4.8, [id number]/29/2022 Creatinine2.32, BUN40, Potassium4.8, Sodium137, [id number] complete set of results can be found in Results Review.  Recommendations: No changes and encouraged to call if experiencing any fluid symptoms.  Follow-up plan: ICM clinic phone appointment on6/14/2022. 91 day device clinic remote transmission6/13/2022.  EP/Cardiology Office Visits: 5/20/2022with Dr MAundra Dubin 10/23/2020 with Dr MDomenic Polite  Copy of ICM check sent to Dr.Taylor.  3 month ICM trend: 06/30/2020.    1 Year ICM trend:       LRosalene Billings RN 06/30/2020 12:18 PM

## 2020-07-03 ENCOUNTER — Other Ambulatory Visit: Payer: Self-pay | Admitting: Cardiology

## 2020-07-03 DIAGNOSIS — I5022 Chronic systolic (congestive) heart failure: Secondary | ICD-10-CM | POA: Diagnosis not present

## 2020-07-04 LAB — BASIC METABOLIC PANEL
BUN/Creatinine Ratio: 13 (ref 12–28)
BUN: 32 mg/dL — ABNORMAL HIGH (ref 8–27)
CO2: 21 mmol/L (ref 20–29)
Calcium: 8.7 mg/dL (ref 8.7–10.3)
Chloride: 96 mmol/L (ref 96–106)
Creatinine, Ser: 2.42 mg/dL — ABNORMAL HIGH (ref 0.57–1.00)
Glucose: 299 mg/dL — ABNORMAL HIGH (ref 65–99)
Potassium: 5 mmol/L (ref 3.5–5.2)
Sodium: 137 mmol/L (ref 134–144)
eGFR: 21 mL/min/{1.73_m2} — ABNORMAL LOW (ref >59)

## 2020-07-04 LAB — SPECIMEN STATUS REPORT

## 2020-07-06 ENCOUNTER — Telehealth (HOSPITAL_COMMUNITY): Payer: Self-pay | Admitting: Cardiology

## 2020-07-06 MED ORDER — FUROSEMIDE 40 MG PO TABS
60.0000 mg | ORAL_TABLET | Freq: Every morning | ORAL | 3 refills | Status: DC
Start: 2020-07-06 — End: 2020-12-07

## 2020-07-06 NOTE — Telephone Encounter (Signed)
Pt aware and voiced understanding Repeat labs at follow up 5/20

## 2020-07-06 NOTE — Telephone Encounter (Signed)
-----   Message from Larey Dresser, MD sent at 07/04/2020 10:57 PM EDT ----- Higher creatinine, hold Lasix for a day and decrease to 60 mg daily. BMET 10 days.

## 2020-07-14 ENCOUNTER — Ambulatory Visit (INDEPENDENT_AMBULATORY_CARE_PROVIDER_SITE_OTHER): Payer: PPO | Admitting: *Deleted

## 2020-07-14 DIAGNOSIS — I4891 Unspecified atrial fibrillation: Secondary | ICD-10-CM

## 2020-07-14 DIAGNOSIS — Z5181 Encounter for therapeutic drug level monitoring: Secondary | ICD-10-CM | POA: Diagnosis not present

## 2020-07-14 DIAGNOSIS — I513 Intracardiac thrombosis, not elsewhere classified: Secondary | ICD-10-CM

## 2020-07-14 LAB — POCT INR: INR: 2.1 (ref 2.0–3.0)

## 2020-07-14 NOTE — Patient Instructions (Signed)
Continue warfarin 1 tablet daily except 2 tablets on Mondays and Thursdays Eat greens/salad tonight Recheck in 6 weeks

## 2020-07-17 ENCOUNTER — Other Ambulatory Visit: Payer: Self-pay | Admitting: Family Medicine

## 2020-07-17 ENCOUNTER — Ambulatory Visit (HOSPITAL_COMMUNITY)
Admission: RE | Admit: 2020-07-17 | Discharge: 2020-07-17 | Disposition: A | Discharge status: Home / Self Care | Payer: PPO | Source: Ambulatory Visit | Attending: Cardiology | Admitting: Cardiology

## 2020-07-17 ENCOUNTER — Other Ambulatory Visit: Payer: Self-pay

## 2020-07-17 ENCOUNTER — Other Ambulatory Visit (HOSPITAL_COMMUNITY): Payer: Self-pay | Admitting: Cardiology

## 2020-07-17 ENCOUNTER — Other Ambulatory Visit: Payer: Self-pay | Admitting: Cardiology

## 2020-07-17 ENCOUNTER — Encounter (HOSPITAL_COMMUNITY): Payer: Self-pay | Admitting: Cardiology

## 2020-07-17 VITALS — BP 130/70 | HR 73 | Wt 137.8 lb

## 2020-07-17 DIAGNOSIS — N184 Chronic kidney disease, stage 4 (severe): Secondary | ICD-10-CM

## 2020-07-17 DIAGNOSIS — Z79899 Other long term (current) drug therapy: Secondary | ICD-10-CM | POA: Diagnosis not present

## 2020-07-17 DIAGNOSIS — Z7984 Long term (current) use of oral hypoglycemic drugs: Secondary | ICD-10-CM | POA: Insufficient documentation

## 2020-07-17 DIAGNOSIS — I5022 Chronic systolic (congestive) heart failure: Secondary | ICD-10-CM | POA: Diagnosis not present

## 2020-07-17 DIAGNOSIS — M109 Gout, unspecified: Secondary | ICD-10-CM | POA: Insufficient documentation

## 2020-07-17 DIAGNOSIS — Z7901 Long term (current) use of anticoagulants: Secondary | ICD-10-CM | POA: Insufficient documentation

## 2020-07-17 DIAGNOSIS — E1122 Type 2 diabetes mellitus with diabetic chronic kidney disease: Secondary | ICD-10-CM | POA: Insufficient documentation

## 2020-07-17 DIAGNOSIS — R0600 Dyspnea, unspecified: Secondary | ICD-10-CM | POA: Diagnosis not present

## 2020-07-17 DIAGNOSIS — I4821 Permanent atrial fibrillation: Secondary | ICD-10-CM

## 2020-07-17 DIAGNOSIS — Z8249 Family history of ischemic heart disease and other diseases of the circulatory system: Secondary | ICD-10-CM | POA: Diagnosis not present

## 2020-07-17 DIAGNOSIS — R7989 Other specified abnormal findings of blood chemistry: Secondary | ICD-10-CM

## 2020-07-17 DIAGNOSIS — H6122 Impacted cerumen, left ear: Secondary | ICD-10-CM

## 2020-07-17 DIAGNOSIS — Z794 Long term (current) use of insulin: Secondary | ICD-10-CM | POA: Insufficient documentation

## 2020-07-17 DIAGNOSIS — I428 Other cardiomyopathies: Secondary | ICD-10-CM | POA: Diagnosis not present

## 2020-07-17 DIAGNOSIS — Z5181 Encounter for therapeutic drug level monitoring: Secondary | ICD-10-CM

## 2020-07-17 LAB — BASIC METABOLIC PANEL
Anion gap: 14 (ref 5–15)
BUN: 27 mg/dL — ABNORMAL HIGH (ref 8–23)
CO2: 21 mmol/L — ABNORMAL LOW (ref 22–32)
Calcium: 8.2 mg/dL — ABNORMAL LOW (ref 8.9–10.3)
Chloride: 103 mmol/L (ref 98–111)
Creatinine, Ser: 2.67 mg/dL — ABNORMAL HIGH (ref 0.44–1.00)
GFR, Estimated: 19 mL/min — ABNORMAL LOW (ref >60)
Glucose, Bld: 287 mg/dL — ABNORMAL HIGH (ref 70–99)
Potassium: 4.9 mmol/L (ref 3.5–5.1)
Sodium: 138 mmol/L (ref 135–145)

## 2020-07-17 MED ORDER — METOPROLOL SUCCINATE ER 50 MG PO TB24
75.0000 mg | ORAL_TABLET | Freq: Every day | ORAL | 6 refills | Status: DC
Start: 2020-07-17 — End: 2020-12-07

## 2020-07-17 NOTE — Progress Notes (Signed)
ReDS Vest / Clip - 07/17/20 1200      ReDS Vest / Clip   Station Marker A    Ruler Value 28    ReDS Value Range Low volume    ReDS Actual Value 29

## 2020-07-17 NOTE — Progress Notes (Signed)
Amb ref to nephrology. Ezequiel Essex, MD

## 2020-07-17 NOTE — Patient Instructions (Signed)
Stop Potassium  Increase Metoprolol to 75 mg (1 & 1/2 tabs) Daily  Labs done today, we will call you for abnormal results  Your physician recommends that you schedule a follow-up appointment in: 3 months with echocardiogram  If you have any questions or concerns before your next appointment please send [place] a message through mychart or call our office at [phone].    TO LEAVE A MESSAGE FOR THE NURSE SELECT OPTION 2, PLEASE LEAVE A MESSAGE INCLUDING: . YOUR NAME . DATE OF BIRTH . CALL BACK NUMBER . REASON FOR CALL**this is important as we prioritize the call backs  [place] AS LONG AS YOU CALL BEFORE 4:00 PM  At the [facility], you and your health needs are our priority. As part of our continuing mission to provide you with exceptional heart care, we have created designated Provider Care Teams. These Care Teams include your primary Cardiologist (physician) and Advanced Practice Providers (APPs- Physician Assistants and Nurse Practitioners) who all work together to provide you with the care you need, when you need it.   You may see any of the following providers on your designated Care Team at your next follow up: Marland Kitchen Dr Glori Bickers . Dr Loralie Champagne . Dr Vickki Muff . Darrick Grinder, NP . Lyda Jester, [place] . Audry Riles, PharmD   Please be sure to bring in all your medications bottles to every appointment.

## 2020-07-19 NOTE — Progress Notes (Signed)
PCP: Ezequiel Essex, MD Cardiology: Dr. Domenic Polite HF Cardiology: Dr. Aundra Dubin  70 y.o. with history of CKD stage IV, permanent atrial fibrillation, and a long-standing nonischemic cardiomyopathy was referred by Dr. Domenic Polite for CHF clinic evaluation.  She first developed cardiac problems in 1988.  At that time, she was admitted with CHF and eventually underwent myocardial biopsy at [facility], per her report showing myocarditis.  Patient states that her EF has been low since then.  She had MDT CRT-D device implanted.  Echo in 7/21 showed EF 20-25%, moderate MR, mild AS. She is now in permanent atrial fibrillation and is s/p AV nodal ablation with BiV pacing. Elevated creatinine has limited medication management.    She returns now for followup of CHF.  Weight is stable.  She has stable dyspnea with moderate exertion like vacuuming (can do this but gets winded).  No dyspnea walking on flat ground.  No BRBPR/melena. She missed her CPX due to gout flare.   Labs (1/21): LDL 118 Labs (9/21): K 4.4, creatinine 1.93 Labs (12/21): K 4, creatinine 1.77 Labs (2/22): TSH normal Labs (5/22): K 5, creatinine 2.42  REDS clip 29%  Medtronic device interrogation: Chronic atrial fibrillation, stable thoracic impedance.   PMH: 1. Gout 2. Complete heart block: Medtronic CRT device.  3. CKD stage IV: Diabetic nephropathy.  4. Type 2 diabetes 5. Atrial fibrillation: Now permanent.   - AF ablation 4/18.  - AV nodal ablation in 2019 with MDT CRT-D.  6. Remote LV thrombus 7. Chronic systolic CHF: Nonischemic cardiomyopathy. Medtronic CRT-D device.  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  - LHC 2004: Nonobstructive mild CAD.  - Echo (8/19): EF 10-15%, mildly dilated LV, mild-moderately dilated RV with mild-moderately decreased systolic function.  - Echo (7/21): EF 20-25%, low normal RV function, moderate MR, mild AS.  8. H/o cholecystectomy 9. Hypothyroidism 10.  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  No smoking or ETOH.   Family History  Problem Relation Age of Onset  . Hip fracture Mother   . COPD Father   . CAD Sister   . Heart attack Sister    ROS: All systems reviewed and negative except as per HPI.   Current Outpatient Medications  Medication Sig Dispense Refill  . acetaminophen (TYLENOL) 500 MG tablet Take 1,000 mg by mouth every 6 (six) hours as needed for mild pain.    . Calcium Carb-Cholecalciferol (CALCIUM 600-D PO) Take 1 tablet by mouth daily.     . empagliflozin (JARDIANCE) 10 MG TABS tablet Take 1 tablet (10 mg total) by mouth daily before breakfast. 30 tablet 11  . furosemide (LASIX) 40 MG tablet Take 1.5 tablets (60 mg total) by mouth every morning. 225 tablet 3  . Glucosamine-Chondroitin (OSTEO BI-FLEX REGULAR STRENGTH PO) Take 1 tablet by mouth daily.    Marland Kitchen levothyroxine (SYNTHROID) 50 MCG tablet Take 1 tablet (50 mcg total) by mouth daily. Take once weekly on Wednesday. 30 tablet 11  . levothyroxine (SYNTHROID) 75 MCG tablet Take 1 tablet (75 mcg total) by mouth daily. Take daily except for Wednesday. 30 tablet 11  . Omega-3 Fatty Acids (FISH OIL PO) Take 1,000 mg by mouth daily.     Marland Kitchen omeprazole (PRILOSEC) 40 MG capsule TAKE 1 CAPSULE BY MOUTH ONCE DAILY. 30 capsule 0  . sacubitril-valsartan (ENTRESTO) 24-26 MG Take 1 tablet by mouth 2 (two) times daily. Needs doctor appointment for further refill. 120 tablet 0  .  simvastatin (ZOCOR) 20 MG tablet Take 1 tablet (20 mg total) by mouth daily. 30 tablet 6  . TOUJEO SOLOSTAR 300 UNIT/ML Solostar Pen INJECT 24 UNITS INTO THE SKIN DAILY. 4.5 mL 11  . metoprolol succinate (TOPROL-XL) 50 MG 24 hr tablet Take 1.5 tablets (75 mg total) by mouth daily. . 45 tablet 6  . potassium chloride SA (KLOR-CON) 20 MEQ tablet TAKE 1 TABLET BY MOUTH ONCE DAILY. 30 tablet 0  . warfarin (COUMADIN) 2 MG tablet TAKE 1 TABLET DAILY EXCEPT 2 TABLETS ON MONDAYS AND THURSDAYS.  40 tablet 3   No current facility-administered medications for this encounter.   BP 130/70   Pulse 73   Wt 62.5 kg (137 lb 12.8 oz)   SpO2 100%   BMI 24.41 kg/m  General: NAD Neck: No JVD, no thyromegaly or thyroid nodule.  Lungs: Clear to auscultation bilaterally with normal respiratory effort. CV: Nondisplaced PMI.  Heart regular S1/S2, no S3/S4, 1/6 SEM RUSB.  No peripheral edema.  No carotid bruit.  Normal pedal pulses.  Abdomen: Soft, nontender, no hepatosplenomegaly, no distention.  Skin: Intact without lesions or rashes.  Neurologic: Alert and oriented x 3.  Psych: Normal affect. Extremities: No clubbing or cyanosis.  HEENT: Normal.   Assessment/Plan: 1. Chronic systolic CHF: Nonischemic cardiomyopathy.  Low EF for years.  Per patient's report, she had an endomyocardial biopsy at [facility] in 1988 showing myocarditis.  Medication titration has been limited by CKD stage IV. MDT CRT-D device.   Echo in 7/21 showed EF 20-25%, moderate MR, mild AS.  On exam and by Optivol, she is not volume overloaded.  NYHA class II symptoms, stable.  Last creatinine elevated to 2.4.  - Continue Lasix 60 mg daily, BMET/BNP today.  - Continue Jardiance 10 mg daily.  - Continue Entresto 24/26 bid.  - Increase Toprol XL to 75 mg daily.  - Continue medication titration based on creatinine response.  - Needs to reschedule CPX.  - Stop KCl.  - Echo at followup appt.  2.  Atrial fibrillation: Permanent.  S/p AV nodal ablation with BiV pacing.   - Continue warfarin.  3. CKD: Stage IV.  Follow closely with medication adjustment.   Followup in 3 months with echo.   Loralie Champagne 07/19/2020

## 2020-07-20 ENCOUNTER — Other Ambulatory Visit (HOSPITAL_COMMUNITY): Payer: Self-pay | Admitting: Cardiology

## 2020-07-21 ENCOUNTER — Encounter (HOSPITAL_COMMUNITY): Payer: PPO

## 2020-07-29 ENCOUNTER — Telehealth (HOSPITAL_COMMUNITY): Payer: Self-pay | Admitting: *Deleted

## 2020-07-29 NOTE — Telephone Encounter (Signed)
Take extra Lasix 40 mg in the afternoon x 3 days.

## 2020-07-29 NOTE — Telephone Encounter (Signed)
Pt left vm stating her weight is now up to 139lbs her normal weight is 133lbs. Pt denies swelling, no increased shortness of breath, denies fatigue and chest pain.  Referral has been placed with [place] [facility]. Pt asked if she can take a fluid pill to get her weight down.   Routed to [place] for advice

## 2020-07-31 NOTE — Telephone Encounter (Signed)
Late entry patient aware and agreeable with plan. Pt notified yesterday 6/2.

## 2020-08-10 ENCOUNTER — Ambulatory Visit (INDEPENDENT_AMBULATORY_CARE_PROVIDER_SITE_OTHER): Payer: PPO

## 2020-08-10 DIAGNOSIS — I420 Dilated cardiomyopathy: Secondary | ICD-10-CM | POA: Diagnosis not present

## 2020-08-11 ENCOUNTER — Ambulatory Visit (INDEPENDENT_AMBULATORY_CARE_PROVIDER_SITE_OTHER): Payer: PPO

## 2020-08-11 DIAGNOSIS — I5022 Chronic systolic (congestive) heart failure: Secondary | ICD-10-CM

## 2020-08-11 DIAGNOSIS — Z95810 Presence of automatic (implantable) cardiac defibrillator: Secondary | ICD-10-CM

## 2020-08-11 LAB — CUP PACEART REMOTE DEVICE CHECK
Battery Remaining Longevity: 8 mo
Battery Voltage: 2.85 V
Brady Statistic RA Percent Paced: 0.01 %
Brady Statistic RV Percent Paced: 97.33 %
Date Time Interrogation Session: 20220612022604
HighPow Impedance: 68 Ohm
Implantable Lead Implant Date: 20050218
Implantable Lead Implant Date: 20050218
Implantable Lead Implant Date: 20050218
Implantable Lead Location: 753858
Implantable Lead Location: 753859
Implantable Lead Location: 753860
Implantable Lead Model: 4194
Implantable Lead Model: 5076
Implantable Lead Model: 6947
Implantable Pulse Generator Implant Date: 20160413
Lead Channel Impedance Value: 399 Ohm
Lead Channel Impedance Value: 418 Ohm
Lead Channel Impedance Value: 456 Ohm
Lead Channel Impedance Value: 589 Ohm
Lead Channel Impedance Value: 703 Ohm
Lead Channel Impedance Value: 988 Ohm
Lead Channel Pacing Threshold Amplitude: 0.75 V
Lead Channel Pacing Threshold Amplitude: 0.875 V
Lead Channel Pacing Threshold Amplitude: 1 V
Lead Channel Pacing Threshold Pulse Width: 0.4 ms
Lead Channel Pacing Threshold Pulse Width: 0.4 ms
Lead Channel Pacing Threshold Pulse Width: 0.4 ms
Lead Channel Sensing Intrinsic Amplitude: 0.5 mV
Lead Channel Sensing Intrinsic Amplitude: 0.5 mV
Lead Channel Sensing Intrinsic Amplitude: 5.125 mV
Lead Channel Sensing Intrinsic Amplitude: 5.125 mV
Lead Channel Setting Pacing Amplitude: 2 V
Lead Channel Setting Pacing Amplitude: 2.25 V
Lead Channel Setting Pacing Amplitude: 2.5 V
Lead Channel Setting Pacing Pulse Width: 0.4 ms
Lead Channel Setting Pacing Pulse Width: 0.4 ms
Lead Channel Setting Sensing Sensitivity: 0.3 mV

## 2020-08-14 NOTE — Progress Notes (Signed)
EPIC Encounter for ICM Monitoring  Patient Name: Emily [name] is a 70 y.o. female Date: 08/14/2020 Primary Care Physican: Ezequiel Essex, MD Primary Cardiologist: [place] Electrophysiologist: Santina Evans Pacing: 97.3%  06/30/2020 Weight: 134 lbs 08/14/2020 Weight: 137 lbs   Time in AT/AF 24.0 hr/day (100.0%) Longest AT/AF            34 months   Battery ERI:  8 months         Spoke with patient and reports feeling well at this time. Heart failure questions reviewed. Pt asymptomatic.  Patient notifiec HF clinic 6/1 of weight gain.          Optivol Thoracic impedance normal but was suggesting possible fluid accumulation from 5/9-6/1.   Per Dr Oleh Genin orders pt took Fursoemide 40 mg bid x days from 6/2-6/5.   Prescribed: Furosemide 40 mg Take 1.5 tablets (60 mg total) by mouth every morning. Potassium 20 mEq 1 tablet by mouth daily.    Labs: 07/17/2020 Creatinine 2.67, BUN 27, Potassium 4.9, Sodium 138 07/03/2020 Creatinine 2.42, BUN 32, Potassium 5.0, Sodium 137, GFR 21  06/26/2020 Creatinine 1.99, BUN 23, Potassium 5.5, Sodium 140, GFR 26  06/09/2020 Creatinine 2.41, BUN 31, Potassium 4.8, Sodium 139 05/26/2020 Creatinine 2.32, BUN 40, Potassium 4.8, Sodium 137, GFR 22  A complete set of results can be found in Results Review.   Recommendations:  No changes and encouraged to call if experiencing any fluid symptoms.   Follow-up plan: ICM clinic phone appointment on 09/21/2020.  91 day device clinic remote transmission 11/09/2020.      EP/Cardiology Office Visits:  10/28/2020 with Dr Aundra Dubin.  10/23/2020 with Dr Domenic Polite.  11/17/2020 with Dr Lovena Le.  Kidney specialist    Copy of ICM check sent to Dr. Lovena Le.     3 month ICM trend: 08/10/2020.    1 Year ICM trend:       [name], RN 08/14/2020 1:26 PM

## 2020-08-17 DIAGNOSIS — I428 Other cardiomyopathies: Secondary | ICD-10-CM | POA: Diagnosis not present

## 2020-08-17 DIAGNOSIS — M109 Gout, unspecified: Secondary | ICD-10-CM | POA: Diagnosis not present

## 2020-08-17 DIAGNOSIS — E1122 Type 2 diabetes mellitus with diabetic chronic kidney disease: Secondary | ICD-10-CM | POA: Diagnosis not present

## 2020-08-17 DIAGNOSIS — N184 Chronic kidney disease, stage 4 (severe): Secondary | ICD-10-CM | POA: Diagnosis not present

## 2020-08-18 ENCOUNTER — Other Ambulatory Visit (HOSPITAL_COMMUNITY): Payer: Self-pay | Admitting: Cardiology

## 2020-08-18 ENCOUNTER — Other Ambulatory Visit: Payer: Self-pay | Admitting: Cardiology

## 2020-08-25 ENCOUNTER — Ambulatory Visit (INDEPENDENT_AMBULATORY_CARE_PROVIDER_SITE_OTHER): Payer: PPO | Admitting: *Deleted

## 2020-08-25 DIAGNOSIS — Z5181 Encounter for therapeutic drug level monitoring: Secondary | ICD-10-CM

## 2020-08-25 DIAGNOSIS — I513 Intracardiac thrombosis, not elsewhere classified: Secondary | ICD-10-CM | POA: Diagnosis not present

## 2020-08-25 DIAGNOSIS — I4891 Unspecified atrial fibrillation: Secondary | ICD-10-CM

## 2020-08-25 LAB — POCT INR: INR: 2.4 (ref 2.0–3.0)

## 2020-08-25 NOTE — Patient Instructions (Signed)
Continue warfarin 1 tablet daily except 2 tablets on Mondays and Thursdays Eat greens/salad tonight Recheck in 6 weeks

## 2020-09-01 NOTE — Progress Notes (Signed)
Remote ICD transmission.   

## 2020-09-05 ENCOUNTER — Emergency Department (HOSPITAL_COMMUNITY): Payer: PPO

## 2020-09-05 ENCOUNTER — Other Ambulatory Visit: Payer: Self-pay

## 2020-09-05 ENCOUNTER — Encounter (HOSPITAL_COMMUNITY): Payer: Self-pay

## 2020-09-05 ENCOUNTER — Emergency Department (HOSPITAL_COMMUNITY)
Admission: EM | Admit: 2020-09-05 | Discharge: 2020-09-05 | Disposition: A | Discharge status: Home / Self Care | Payer: PPO | Attending: Emergency Medicine | Admitting: Emergency Medicine

## 2020-09-05 DIAGNOSIS — S6991XA Unspecified injury of right wrist, hand and finger(s), initial encounter: Secondary | ICD-10-CM | POA: Diagnosis present

## 2020-09-05 DIAGNOSIS — Z7901 Long term (current) use of anticoagulants: Secondary | ICD-10-CM | POA: Diagnosis not present

## 2020-09-05 DIAGNOSIS — I4891 Unspecified atrial fibrillation: Secondary | ICD-10-CM | POA: Diagnosis not present

## 2020-09-05 DIAGNOSIS — N184 Chronic kidney disease, stage 4 (severe): Secondary | ICD-10-CM | POA: Diagnosis not present

## 2020-09-05 DIAGNOSIS — I5023 Acute on chronic systolic (congestive) heart failure: Secondary | ICD-10-CM | POA: Diagnosis not present

## 2020-09-05 DIAGNOSIS — Z79899 Other long term (current) drug therapy: Secondary | ICD-10-CM | POA: Diagnosis not present

## 2020-09-05 DIAGNOSIS — I130 Hypertensive heart and chronic kidney disease with heart failure and stage 1 through stage 4 chronic kidney disease, or unspecified chronic kidney disease: Secondary | ICD-10-CM | POA: Insufficient documentation

## 2020-09-05 DIAGNOSIS — S63501A Unspecified sprain of right wrist, initial encounter: Secondary | ICD-10-CM

## 2020-09-05 DIAGNOSIS — X58XXXA Exposure to other specified factors, initial encounter: Secondary | ICD-10-CM | POA: Diagnosis not present

## 2020-09-05 DIAGNOSIS — I2510 Atherosclerotic heart disease of native coronary artery without angina pectoris: Secondary | ICD-10-CM | POA: Diagnosis not present

## 2020-09-05 DIAGNOSIS — Z95810 Presence of automatic (implantable) cardiac defibrillator: Secondary | ICD-10-CM | POA: Insufficient documentation

## 2020-09-05 DIAGNOSIS — Z7984 Long term (current) use of oral hypoglycemic drugs: Secondary | ICD-10-CM | POA: Insufficient documentation

## 2020-09-05 DIAGNOSIS — E1122 Type 2 diabetes mellitus with diabetic chronic kidney disease: Secondary | ICD-10-CM | POA: Diagnosis not present

## 2020-09-05 DIAGNOSIS — E039 Hypothyroidism, unspecified: Secondary | ICD-10-CM | POA: Insufficient documentation

## 2020-09-05 DIAGNOSIS — M19031 Primary osteoarthritis, right wrist: Secondary | ICD-10-CM | POA: Diagnosis not present

## 2020-09-05 MED ORDER — TRAMADOL HCL 50 MG PO TABS: 50.0000 mg | ORAL_TABLET | Freq: Two times a day (BID) | ORAL | 0 refills | Status: AC | PRN

## 2020-09-05 MED ORDER — TRAMADOL HCL 50 MG PO TABS: 50.0000 mg | ORAL_TABLET | Freq: Two times a day (BID) | ORAL | 0 refills | Status: DC | PRN

## 2020-09-05 NOTE — ED Provider Notes (Signed)
[facility] EMERGENCY DEPARTMENT Provider Note   CSN: [id number] Arrival date & time: 09/05/20  0751     History Chief Complaint  Patient presents with   Wrist Pain    Emily [name] is a 70 y.o. female.  Presents to ER with concern for wrist pain.  Wrist pain ongoing for the past couple days, started wearing a wrist brace but has not had any relief.  Pain worse with movement, improved with rest.  Has not noted any significant swelling, no redness, no fevers.  She does have history of gout that primarily affects her feet and toes.  No known injuries.  HPI     Past Medical History:  Diagnosis Date   Acute pain of right shoulder 03/22/2019   Acute renal failure ([place]) 04/15/2012   Last Assessment & Plan:  Superimposed on CK D stage III, Baseline Cr ~2-2.5, remains above baseline, felt to be due to Cardiorenal syndrome, creatinine trending down at time of discharge Negative urine eos and no urinary retention ACE inhibitor discontinued Follows with Dr Karen Chafe as outpatient, to have labs drawn 08/15/16 and results sent to [facility] office for review   Anemia 12/29/2016   Biventricular ICD (implantable cardioverter-defibrillator) in place    CHF (congestive heart failure) ([place])    CKD (chronic kidney disease), stage IV ([place])    Colitis 09/11/2012   Complete heart block ([place])    Essential hypertension    Hypomagnesemia 04/10/2016   Hyponatremia 04/10/2016   Last Assessment & Plan:  - improved after tolvaptan 6/29, chiefly driven by CHF, further improvement with ongoing IV Lasix - continue low Na and fluid restriction - compliance with sodium restriction a major barrier to keeping patient out of hospital and compensated, remains resistant to this philosophy while inpatient - follows with Dr Karen Chafe as outpatient, Neph consult not indicated at this ti   Localized macular rash 08/11/2016   Last Assessment & Plan:  Seems to trend with heart failure, though she has more findings behind bilateral  knees and up into her thigh/groin area than when I last saw her Would use steroid cream for now, consider alternative diagnosis if no improvement   Nonischemic cardiomyopathy ([place])    On amiodarone therapy 08/21/2017   Persistent atrial fibrillation ([place])    AV node ablation 2019   Restless leg syndrome 03/22/2019   Type 2 diabetes mellitus [facility])     Patient Active Problem List   Diagnosis Date Noted   Hearing loss of left ear due to cerumen impaction 12/31/2019   Phlegm in throat 12/31/2019   Chronic tension-type headache, intractable 03/22/2019   Atrial fibrillation with RVR ([place])    Atrial fibrillation (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   Coronary artery disease involving native coronary artery of native heart without angina pectoris 08/28/2016   CKD (chronic kidney disease) stage 4, GFR 15-29 ml/min ([place]) 08/26/2016   Hypothyroidism 04/11/2016   GERD (gastroesophageal reflux disease) 04/11/2016   Gastroenteritis 04/10/2016   Long-term insulin use in type 2 diabetes ([place]) 04/10/2016   Persistent atrial fibrillation ([place]) 04/05/2016   [place] thrombus of cardiac apex 12/18/2015   Warfarin anticoagulation 04/21/2014   ICD (implantable cardioverter-defibrillator) battery depletion 04/21/2014   Acute on chronic systolic congestive heart failure ([place])  05/02/2012   Dilated cardiomyopathy ([place]) 05/24/2011   HTN (hypertension), benign 05/24/2011   Hypercholesterolemia 05/24/2011   ICD (implantable cardioverter-defibrillator), biventricular, in situ 05/24/2011    Past Surgical History:  Procedure Laterality Date   ABLATION     AV NODE ABLATION N/A 08/08/2017   Procedure: AV NODE ABLATION;   Surgeon: Thompson Grayer, MD;  Location: [facility];  Service: Cardiovascular;  Laterality: N/A;   BREAST BIOPSY     CARDIAC DEFIBRILLATOR PLACEMENT  2005   MDT Viva XT CRT-D BiV ICD implanted by Dr Rosaland Lao in [place]       OB History   No obstetric history on file.     Family History  Problem Relation Age of Onset   Hip fracture Mother    COPD Father    CAD Sister    Heart attack Sister     Social History   Tobacco Use   Smoking status: Never   Smokeless tobacco: Never  Vaping Use   Vaping Use: Never used  Substance Use Topics   Alcohol use: No   Drug use: No    Home Medications Prior to Admission medications   Medication Sig Start Date End Date Taking? Authorizing Provider  acetaminophen (TYLENOL) 500 MG tablet Take 1,000 mg by mouth every 6 (six) hours as needed for mild pain.    [provider]  allopurinol (ZYLOPRIM) 100 MG tablet Take 100 mg by mouth daily.    [provider]  Calcium Carb-Cholecalciferol (CALCIUM 600-D PO) Take 1 tablet by mouth daily.     [provider]  empagliflozin (JARDIANCE) 10 MG TABS tablet Take 1 tablet (10 mg total) by mouth daily before breakfast. 05/26/20   Larey Dresser, MD  furosemide (LASIX) 40 MG tablet Take 1.5 tablets (60 mg total) by mouth every morning. 07/06/20   Larey Dresser, MD  Glucosamine-Chondroitin (OSTEO BI-FLEX REGULAR STRENGTH PO) Take 1 tablet by mouth daily.    [provider]  levothyroxine (SYNTHROID) 50 MCG tablet Take 1 tablet (50 mcg total) by mouth daily. Take once weekly on Wednesday. 05/12/20 05/12/21  Ezequiel Essex, MD  levothyroxine (SYNTHROID) 75 MCG tablet Take 1 tablet (75 mcg total) by mouth daily. Take daily except for Wednesday. 05/12/20 05/12/21  Ezequiel Essex, MD  metoprolol succinate (TOPROL-XL) 50 MG 24 hr tablet Take 1.5 tablets (75 mg  total) by mouth daily. . 07/17/20   Larey Dresser, MD  Omega-3 Fatty Acids (FISH OIL PO) Take 1,000 mg by mouth daily.     [provider]  omeprazole (PRILOSEC) 40 MG capsule TAKE 1 CAPSULE BY MOUTH ONCE DAILY. 08/18/20   Larey Dresser, MD  potassium chloride SA (KLOR-CON) 20 MEQ tablet TAKE 1 TABLET BY MOUTH ONCE DAILY. 07/17/20   Satira Sark, MD  sacubitril-valsartan (ENTRESTO) 24-26 MG Take 1 tablet by mouth 2 (two) times daily. Needs doctor appointment for further refill. 04/20/20   Larey Dresser, MD  simvastatin (ZOCOR) 20 MG tablet TAKE 1 TABLET EVERY DAY AT 6 PM 08/18/20   Satira Sark, MD  TOUJEO SOLOSTAR 300 UNIT/ML Solostar Pen INJECT 24 UNITS INTO THE SKIN DAILY. 06/10/19   Kathrene Alu, MD  traMADol (ULTRAM) 50 MG tablet Take 1 tablet (50 mg total) by mouth every 12 (twelve) hours as needed for up to 5 days for severe  pain. 09/05/20 09/10/20  Lucrezia Starch, MD  warfarin (COUMADIN) 2 MG tablet TAKE 1 TABLET DAILY EXCEPT 2 TABLETS ON MONDAYS AND THURSDAYS. 07/17/20   Larey Dresser, MD    Allergies    Amiodarone, Codeine, Other, and Iodine  Review of Systems   Review of Systems  Musculoskeletal:  Positive for arthralgias.  All other systems reviewed and are negative.  Physical Exam Updated Vital Signs BP 123/63 (BP Location: Left Arm)   Pulse 79   Temp 98 F (36.7 C) (Oral)   Resp 18   Ht '5\' 2"'$  (1.575 m)   Wt 61.7 kg   SpO2 100%   BMI 24.87 kg/m   Physical Exam Vitals and nursing note reviewed.  Constitutional:      General: She is not in acute distress.    Appearance: She is well-developed.  HENT:     Head: Normocephalic and atraumatic.  Eyes:     Conjunctiva/sclera: Conjunctivae normal.  Cardiovascular:     Rate and Rhythm: Normal rate.     Pulses: Normal pulses.  Pulmonary:     Effort: Pulmonary effort is normal. No respiratory distress.     Breath sounds: Normal breath sounds.  Musculoskeletal:     Cervical back: Neck  supple.     Comments: Right upper extremity: With some tenderness to the wrist, no joint effusion appreciated, no erythema, no warmth, normal ROM, normal radial pulse, normal sensation  Skin:    General: Skin is warm and dry.  Neurological:     Mental Status: She is alert.    ED Results / Procedures / Treatments   Labs (all labs ordered are listed, but only abnormal results are displayed) Labs Reviewed - No data to display  EKG None  Radiology No results found.  Procedures Procedures   Medications Ordered in ED Medications - No data to display  ED Course  I have reviewed the triage vital signs and the nursing notes.  Pertinent labs & imaging results that were available during my care of the patient were reviewed by me and considered in my medical decision making (see chart for details).    MDM Rules/Calculators/A&P                          70 year old lady presents to ER with concern for wrist pain.  Atraumatic.  No obvious deformity on exam, no swelling.  Did have some tenderness.  Has history of gout.  Suspect may have very early gout flare versus osteoarthritis versus strain.  Discussed medication treatment options, patient declines steroids or NSAIDs.  Will give very short Rx for tramadol for better symptom control, recommend follow-up with Ortho and discharged.  After the discussed management above, the patient was determined to be safe for discharge.  The patient was in agreement with this plan and all questions regarding their care were answered.  ED return precautions were discussed and the patient will return to the ED with any significant worsening of condition.  Final Clinical Impression(s) / ED Diagnoses Final diagnoses:  Sprain of right wrist, initial encounter    Rx / DC Orders ED Discharge Orders          Ordered    traMADol (ULTRAM) 50 MG tablet  Every 12 hours PRN,   Status:  Discontinued        09/05/20 0942    traMADol (ULTRAM) 50 MG tablet  Every  12 hours PRN  09/05/20 [id number]             Lucrezia Starch, MD 09/07/20 [phone]

## 2020-09-05 NOTE — Discharge Instructions (Addendum)
Continue Tylenol for pain control.  For breakthrough pain take the prescribed tramadol as needed.  Please note this can make you drowsy and should not be taken while driving or operating heavy machinery.  Recommend following up with your primary doctor and orthopedic specialist.  Come back to ER if you develop redness, swelling, fever or other new concerning symptom.

## 2020-09-05 NOTE — ED Triage Notes (Signed)
Pt presents to ED with complaints of right wrist pain x couple days. NKI. Pt had wrist brace at home and has been using it without relief.

## 2020-09-10 ENCOUNTER — Ambulatory Visit: Payer: PPO | Admitting: Orthopedic Surgery

## 2020-09-10 ENCOUNTER — Encounter: Payer: Self-pay | Admitting: Orthopedic Surgery

## 2020-09-10 ENCOUNTER — Other Ambulatory Visit: Payer: Self-pay

## 2020-09-10 VITALS — BP 127/56 | HR 93 | Ht 63.0 in | Wt 139.0 lb

## 2020-09-10 DIAGNOSIS — M1A0310 Idiopathic chronic gout, right wrist, without tophus (tophi): Secondary | ICD-10-CM | POA: Diagnosis not present

## 2020-09-10 MED ORDER — COLCHICINE 0.6 MG PO TABS: ORAL_TABLET | ORAL | 0 refills | Status: DC

## 2020-09-10 NOTE — Progress Notes (Signed)
NEW PROBLEM//OFFICE VISIT  Summary assessment and plan:   Is likely gout patient started on colchicine follow-up in 3 weeks  Meds ordered this encounter  Medications   colchicine 0.6 MG tablet    Sig: Take 1 every 6 hrs until pain subsides or you get diarrhea    Dispense:  60 tablet    Refill:  0     Chief Complaint  Patient presents with   Wrist Pain    Right wrist/no known injury/did start hurting after she picked up a tote bag of the washing machine   70 year old female history of gout pain swelling right wrist worsened when she picked up a heavy object but pain was present before x-rays were negative except for osteopenia and positive ulnar variance patient did not tolerate splint  Patient said her last gout attack of her small toe she was not allowed to take any medicine because of poor kidney function    MEDICAL DECISION MAKING  A.  Encounter Diagnosis  Name Primary?   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 DATA ANALYSED:   IMAGING: Interpretation of images: I reviewed the images from the hospital and she has ulnar positive variance severe osteopenia normal volar tilt normal radial inclination  No fracture   Orders: No new orders  Outside records reviewed: Emergency room record Emily [name] is a 70 y.o. female.  Presents to ER with concern for wrist pain.  Wrist pain ongoing for the past couple days, started wearing a wrist brace but has not had any relief.  Pain worse with movement, improved with rest.  Has not noted any significant swelling, no redness, no fevers.  She does have history of gout that primarily affects her feet and toes.  No known injuries.   C. MANAGEMENT   Medical management  Meds ordered this encounter  Medications   colchicine 0.6 MG tablet    Sig: Take 1 every 6 hrs until pain subsides or you get diarrhea    Dispense:  60 tablet    Refill:  0     BP (!) 127/56   Pulse 93   Ht '5\' 3"'$  (1.6 m)   Wt  139 lb (63 kg)   BMI 24.62 kg/m    General appearance: Well-developed well-nourished no gross deformities  Cardiovascular normal pulse and perfusion normal color without edema  Neurologically no sensation loss or deficits or pathologic reflexes  Psychological: Awake alert and oriented x3 mood and affect normal  Skin no lacerations or ulcerations no nodularity no palpable masses, no erythema or nodularity  Musculoskeletal: Patient is having tenderness over the ulna and radiating up the arm on the right wrist with swelling globally about the wrist with tenderness to touch and redness over the ulna     ROS no fever no numbness no tingling   Past Medical History:  Diagnosis Date   Acute pain of right shoulder 03/22/2019   Acute renal failure ([place]) 04/15/2012   Last Assessment & Plan:  Superimposed on CK D stage III, Baseline Cr ~2-2.5, remains above baseline, felt to be due to Cardiorenal syndrome, creatinine trending down at time of discharge Negative urine eos and no urinary retention ACE inhibitor discontinued Follows with Dr Karen Chafe as outpatient, to have labs drawn 08/15/16 and results sent to [facility] office for review   Anemia 12/29/2016   Biventricular ICD (implantable cardioverter-defibrillator) in place    CHF (congestive heart failure) ([place])    CKD (chronic kidney  disease), stage IV ([place])    Colitis 09/11/2012   Complete heart block ([facility])    Essential hypertension    Hypomagnesemia 04/10/2016   Hyponatremia 04/10/2016   Last Assessment & Plan:  - improved after tolvaptan 6/29, chiefly driven by CHF, further improvement with ongoing IV Lasix - continue low Na and fluid restriction - compliance with sodium restriction a major barrier to keeping patient out of hospital and compensated, remains resistant to this philosophy while inpatient - follows with Dr Karen Chafe as outpatient, Neph consult not indicated at this ti   Localized macular rash 08/11/2016   Last Assessment & Plan:   Seems to trend with heart failure, though she has more findings behind bilateral knees and up into her thigh/groin area than when I last saw her Would use steroid cream for now, consider alternative diagnosis if no improvement   Nonischemic cardiomyopathy ([place])    On amiodarone therapy 08/21/2017   Persistent atrial fibrillation ([place])    AV node ablation 2019   Restless leg syndrome 03/22/2019   Type 2 diabetes mellitus ([place])     Past Surgical History:  Procedure Laterality Date   ABLATION     AV NODE ABLATION N/A 08/08/2017   Procedure: AV NODE ABLATION;  Surgeon: Thompson Grayer, MD;  Location: [facility];  Service: Cardiovascular;  Laterality: N/A;   BREAST BIOPSY     CARDIAC DEFIBRILLATOR PLACEMENT  2005   MDT Viva XT CRT-D BiV ICD implanted by Dr Rosaland Lao in [place]   CATARACT EXTRACTION Bilateral    CHOLECYSTECTOMY     THYROIDECTOMY     TOTAL VAGINAL HYSTERECTOMY      Family History  Problem Relation Age of Onset   Hip fracture Mother    COPD Father    CAD Sister    Heart attack Sister    Social History   Tobacco Use   Smoking status: Never   Smokeless tobacco: Never  Vaping Use   Vaping Use: Never used  Substance Use Topics   Alcohol use: No   Drug use: No    Allergies  Allergen Reactions   Amiodarone Rash   Codeine Other (See Comments)    Euphoria, hallucinations   Other Palpitations    Other reaction(s): Other (See Comments) PT STATES SHE GETS REALLY HOT , DIZZY HEADED, HAS PASSED OUT TWICE , AND ITCHING WITH IV DYE Tumeric   Iodine Rash    topical    Current Meds  Medication Sig   acetaminophen (TYLENOL) 500 MG tablet Take 1,000 mg by mouth every 6 (six) hours as needed for mild pain.   allopurinol (ZYLOPRIM) 100 MG tablet Take 100 mg by mouth daily.   Calcium Carb-Cholecalciferol (CALCIUM 600-D PO) Take 1 tablet by mouth daily.    colchicine 0.6 MG tablet Take 1 every 6 hrs until pain subsides or you get diarrhea   empagliflozin  (JARDIANCE) 10 MG TABS tablet Take 1 tablet (10 mg total) by mouth daily before breakfast.   furosemide (LASIX) 40 MG tablet Take 1.5 tablets (60 mg total) by mouth every morning.   Glucosamine-Chondroitin (OSTEO BI-FLEX REGULAR STRENGTH PO) Take 1 tablet by mouth daily.   levothyroxine (SYNTHROID) 50 MCG tablet Take 1 tablet (50 mcg total) by mouth daily. Take once weekly on Wednesday.   levothyroxine (SYNTHROID) 75 MCG tablet Take 1 tablet (75 mcg total) by mouth daily. Take daily except for Wednesday.   metoprolol succinate (TOPROL-XL) 50 MG 24 hr tablet Take 1.5 tablets (75  mg total) by mouth daily. .   Omega-3 Fatty Acids (FISH OIL PO) Take 1,000 mg by mouth daily.    omeprazole (PRILOSEC) 40 MG capsule TAKE 1 CAPSULE BY MOUTH ONCE DAILY.   potassium chloride SA (KLOR-CON) 20 MEQ tablet TAKE 1 TABLET BY MOUTH ONCE DAILY.   sacubitril-valsartan (ENTRESTO) 24-26 MG Take 1 tablet by mouth 2 (two) times daily. Needs doctor appointment for further refill.   simvastatin (ZOCOR) 20 MG tablet TAKE 1 TABLET EVERY DAY AT 6 PM   TOUJEO SOLOSTAR 300 UNIT/ML Solostar Pen INJECT 24 UNITS INTO THE SKIN DAILY.   traMADol (ULTRAM) 50 MG tablet Take 1 tablet (50 mg total) by mouth every 12 (twelve) hours as needed for up to 5 days for severe pain.   warfarin (COUMADIN) 2 MG tablet TAKE 1 TABLET DAILY EXCEPT 2 TABLETS ON MONDAYS AND THURSDAYS.        Arther Abbott, MD  09/10/2020 3:05 PM

## 2020-09-10 NOTE — Patient Instructions (Signed)
ICE WRIST FOR SWELLING   START  Meds ordered this encounter  Medications   colchicine 0.6 MG tablet    Sig: Take 1 every 6 hrs until pain subsides or you get diarrhea    Dispense:  60 tablet    Refill:  0

## 2020-09-18 ENCOUNTER — Other Ambulatory Visit: Payer: Self-pay | Admitting: Family Medicine

## 2020-09-18 DIAGNOSIS — E039 Hypothyroidism, unspecified: Secondary | ICD-10-CM

## 2020-09-21 ENCOUNTER — Ambulatory Visit (INDEPENDENT_AMBULATORY_CARE_PROVIDER_SITE_OTHER): Payer: PPO

## 2020-09-21 DIAGNOSIS — Z95810 Presence of automatic (implantable) cardiac defibrillator: Secondary | ICD-10-CM | POA: Diagnosis not present

## 2020-09-21 DIAGNOSIS — I5022 Chronic systolic (congestive) heart failure: Secondary | ICD-10-CM | POA: Diagnosis not present

## 2020-09-22 NOTE — Progress Notes (Signed)
EPIC Encounter for ICM Monitoring  Patient Name: Emily [name] is a 70 y.o. female Date: 09/22/2020 Primary Care Physican: Ezequiel Essex, MD Primary Cardiologist: [facility] Electrophysiologist: Santina Evans Pacing: 98.9%  09/22/2020 Weight: 134 lbs   Time in AT/AF 24.0 hr/day (100.0%) Longest AT/AF            35 months   Battery ERI:  7 months         Spoke with patient and reports feeling well at this time. Heart failure questions reviewed. Pt asymptomatic for fluid symptoms.  She is getting over [place].            Optivol Thoracic impedance normal.   Prescribed: Furosemide 40 mg Take 1.5 tablets (60 mg total) by mouth every morning. Potassium 20 mEq 1 tablet by mouth daily.    Labs: 07/17/2020 Creatinine 2.67, BUN 27, Potassium 4.9, Sodium 138 07/03/2020 Creatinine 2.42, BUN 32, Potassium 5.0, Sodium 137, GFR 21 06/26/2020 Creatinine 1.99, BUN 23, Potassium 5.5, Sodium 140, GFR 26  06/09/2020 Creatinine 2.41, BUN 31, Potassium 4.8, Sodium 139 05/26/2020 Creatinine 2.32, BUN 40, Potassium 4.8, Sodium 137, GFR 22  A complete set of results can be found in Results Review.   Recommendations:  No changes and encouraged to call if experiencing any fluid symptoms.   Follow-up plan: ICM clinic phone appointment on 10/26/2020.  91 day device clinic remote transmission 11/09/2020.      EP/Cardiology Office Visits:  10/28/2020 with Dr Aundra Dubin.  10/23/2020 with Dr Domenic Polite.  11/17/2020 with Dr Lovena Le.  Kidney specialist   Copy of ICM check sent to Dr. Lovena Le.   3 month ICM trend: 09/21/2020.    1 Year ICM trend:       [name], RN 09/22/2020 2:34 PM

## 2020-09-25 ENCOUNTER — Telehealth: Payer: Self-pay | Admitting: Orthopedic Surgery

## 2020-09-25 NOTE — Telephone Encounter (Signed)
Patient states that her hands are hurting.  She said that she couldn't have the medication that Dr. Aline Brochure gave her.  She said it caused a rash on her legs.   She would like to have something else sent in to [facility] for her.  She does have an appointment this coming Thursday and says she will be here at that time  Thanks

## 2020-10-01 ENCOUNTER — Ambulatory Visit: Payer: PPO | Admitting: Orthopedic Surgery

## 2020-10-01 ENCOUNTER — Encounter: Payer: Self-pay | Admitting: Orthopedic Surgery

## 2020-10-01 ENCOUNTER — Other Ambulatory Visit: Payer: Self-pay

## 2020-10-01 ENCOUNTER — Other Ambulatory Visit (HOSPITAL_COMMUNITY): Payer: Self-pay | Admitting: Cardiology

## 2020-10-01 VITALS — BP 127/64 | HR 88 | Ht 63.0 in | Wt 139.0 lb

## 2020-10-01 DIAGNOSIS — M1A0310 Idiopathic chronic gout, right wrist, without tophus (tophi): Secondary | ICD-10-CM

## 2020-10-01 NOTE — Progress Notes (Signed)
FOLLOW UP   Encounter Diagnosis  Name Primary?   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  However she got a rash on her legs and arms and had to stop taking the medication  She was put on prednisone for 3 days she is going to take the last 1 today she reports improvement although not complete resolution of pain and swelling of the right wrist she still has some motion deficit there as well  She developed fluid on her legs which is concerning because she has a pacemaker defibrillator and has not had any fluid for several years  She does say that they can usually tell when there is fluid on her heart and will let her know  Exam shows decreased pronation supination pain in the wrist joint including the ulnar side improved MP joint flexion  This is a diagnostic therapeutic dilemma.  She had complications from the colchicine possibly from the prednisone cannot take anti-inflammatories and possibly has gotten some fluid on both lower extremities and the ankle area  She is getting better so I am going to let her finish the prednisone and then stop the medication and hopefully she will get another attack she is to continue her allopurinol as well   Acute uncomplicated, no new data, complications from the medication side effects

## 2020-10-02 ENCOUNTER — Other Ambulatory Visit: Payer: Self-pay | Admitting: Family Medicine

## 2020-10-06 ENCOUNTER — Ambulatory Visit (INDEPENDENT_AMBULATORY_CARE_PROVIDER_SITE_OTHER): Payer: PPO | Admitting: *Deleted

## 2020-10-06 ENCOUNTER — Other Ambulatory Visit: Payer: Self-pay

## 2020-10-06 DIAGNOSIS — I4891 Unspecified atrial fibrillation: Secondary | ICD-10-CM | POA: Diagnosis not present

## 2020-10-06 DIAGNOSIS — I513 Intracardiac thrombosis, not elsewhere classified: Secondary | ICD-10-CM

## 2020-10-06 DIAGNOSIS — Z5181 Encounter for therapeutic drug level monitoring: Secondary | ICD-10-CM | POA: Diagnosis not present

## 2020-10-06 LAB — POCT INR: INR: 3.1 — AB (ref 2.0–3.0)

## 2020-10-06 NOTE — Patient Instructions (Signed)
Continue warfarin 1 tablet daily except 2 tablets on Mondays and Thursdays Eat greens/salad tonight Recheck in 6 weeks

## 2020-10-23 ENCOUNTER — Other Ambulatory Visit: Payer: Self-pay

## 2020-10-23 ENCOUNTER — Ambulatory Visit: Payer: PPO | Admitting: Cardiology

## 2020-10-23 ENCOUNTER — Encounter: Payer: Self-pay | Admitting: Cardiology

## 2020-10-23 VITALS — BP 100/60 | HR 64 | Ht 63.0 in | Wt 136.2 lb

## 2020-10-23 DIAGNOSIS — N184 Chronic kidney disease, stage 4 (severe): Secondary | ICD-10-CM

## 2020-10-23 DIAGNOSIS — I428 Other cardiomyopathies: Secondary | ICD-10-CM | POA: Diagnosis not present

## 2020-10-23 DIAGNOSIS — I4821 Permanent atrial fibrillation: Secondary | ICD-10-CM | POA: Diagnosis not present

## 2020-10-23 DIAGNOSIS — I5022 Chronic systolic (congestive) heart failure: Secondary | ICD-10-CM

## 2020-10-23 NOTE — Patient Instructions (Addendum)
Medication Instructions:  Your physician recommends that you continue on your current medications as directed. Please refer to the Current Medication list given to you today.  Labwork: BMET-have done as soon as possible. This can be done at [facility].  Testing/Procedures: Keep echo appointment that is already scheduled  Follow-Up: Your physician recommends that you schedule a follow-up appointment in: 6 months  Any Other Special Instructions Will Be Listed Below (If Applicable).  If you need a refill on your cardiac medications before your next appointment, please call your pharmacy.

## 2020-10-23 NOTE — Progress Notes (Signed)
Cardiology Office Note  Date: 10/23/2020   ID: Emily [name], DOB 1950/05/09, MRN [id number]  PCP:  Ezequiel Essex, MD  Cardiologist:  Rozann Lesches, MD Electrophysiologist:  None   Chief Complaint  Patient presents with   Cardiac follow-up    History of Present Illness: Emily [name] is a 70 y.o. female last seen by Dr. Aundra Dubin in the heart failure clinic back in May, I reviewed the note.  We met back in September 2021, a former patient of Dr. Bronson Ing.  She is here for a routine visit, reports NYHA class II dyspnea, no chest pain, no palpitations or syncope.  Her weight is down 3 pounds, she has had no orthopnea or PND, no leg swelling.  Has a Medtronic CRT-D in place with follow-up by Dr. Lovena Le.  Recent thoracic impedance was normal.  She remains on Coumadin with follow-up in anticoagulation clinic, recent INR 3.1.  Follow-up echocardiogram is pending.  She was to have a CPX, but it sounds like she decided against it.  I reviewed her medications which are noted below.  Today's ECG shows a ventricular paced rhythm.  Past Medical History:  Diagnosis Date   Acute pain of right shoulder 03/22/2019   Acute renal failure ([place]) 04/15/2012   Last Assessment & Plan:  Superimposed on CK D stage III, Baseline Cr ~2-2.5, remains above baseline, felt to be due to Cardiorenal syndrome, creatinine trending down at time of discharge Negative urine eos and no urinary retention ACE inhibitor discontinued Follows with Dr Karen Chafe as outpatient, to have labs drawn 08/15/16 and results sent to [facility] office for review   Anemia 12/29/2016   Biventricular ICD (implantable cardioverter-defibrillator) in place    CHF (congestive heart failure) ([place])    CKD (chronic kidney disease), stage IV ([place])    Colitis 09/11/2012   Complete heart block ([place])    Essential hypertension    Hypomagnesemia 04/10/2016   Hyponatremia 04/10/2016   Last Assessment & Plan:  - improved after tolvaptan 6/29,  chiefly driven by CHF, further improvement with ongoing IV Lasix - continue low Na and fluid restriction - compliance with sodium restriction a major barrier to keeping patient out of hospital and compensated, remains resistant to this philosophy while inpatient - follows with Dr Karen Chafe as outpatient, Neph consult not indicated at this ti   Localized macular rash 08/11/2016   Last Assessment & Plan:  Seems to trend with heart failure, though she has more findings behind bilateral knees and up into her thigh/groin area than when I last saw her Would use steroid cream for now, consider alternative diagnosis if no improvement   Nonischemic cardiomyopathy ([place])    On amiodarone therapy 08/21/2017   Persistent atrial fibrillation ([place])    AV node ablation 2019   Restless leg syndrome 03/22/2019   Type 2 diabetes mellitus ([place])     Past Surgical History:  Procedure Laterality Date   ABLATION     AV NODE ABLATION N/A 08/08/2017   Procedure: AV NODE ABLATION;  Surgeon: Thompson Grayer, MD;  Location: [facility];  Service: Cardiovascular;  Laterality: N/A;   BREAST BIOPSY     CARDIAC DEFIBRILLATOR PLACEMENT  2005   MDT Viva XT CRT-D BiV ICD implanted by Dr Rosaland Lao in [place] Bilateral    CHOLECYSTECTOMY     THYROIDECTOMY     TOTAL VAGINAL HYSTERECTOMY      Current Outpatient Medications  Medication Sig Dispense Refill  acetaminophen (TYLENOL) 500 MG tablet Take 1,000 mg by mouth every 6 (six) hours as needed for mild pain.     allopurinol (ZYLOPRIM) 100 MG tablet Take 100 mg by mouth daily.     Calcium Carb-Cholecalciferol (CALCIUM 600-D PO) Take 1 tablet by mouth daily.      empagliflozin (JARDIANCE) 10 MG TABS tablet Take 1 tablet (10 mg total) by mouth daily before breakfast. 30 tablet 11   furosemide (LASIX) 40 MG tablet Take 1.5 tablets (60 mg total) by mouth every morning. 225 tablet 3   Glucosamine-Chondroitin (OSTEO BI-FLEX REGULAR STRENGTH PO) Take  1 tablet by mouth daily.     levothyroxine (SYNTHROID) 88 MCG tablet Take 88 mcg by mouth daily before breakfast.     metoprolol succinate (TOPROL-XL) 50 MG 24 hr tablet Take 1.5 tablets (75 mg total) by mouth daily. . 45 tablet 6   Omega-3 Fatty Acids (FISH OIL PO) Take 1,000 mg by mouth daily.      omeprazole (PRILOSEC) 40 MG capsule TAKE 1 CAPSULE BY MOUTH ONCE DAILY. 30 capsule 3   sacubitril-valsartan (ENTRESTO) 24-26 MG Take 1 tablet by mouth 2 (two) times daily. Needs doctor appointment for further refill. 120 tablet 0   simvastatin (ZOCOR) 20 MG tablet TAKE 1 TABLET EVERY DAY AT 6 PM 30 tablet 3   TOUJEO SOLOSTAR 300 UNIT/ML Solostar Pen INJECT 24 UNITS INTO THE SKIN EVERY DAY 4.5 mL 0   warfarin (COUMADIN) 2 MG tablet TAKE 1 TABLET DAILY EXCEPT 2 TABLETS ON MONDAYS AND THURSDAYS. 40 tablet 3   No current facility-administered medications for this visit.   Allergies:  Amiodarone, Codeine, Other, and Iodine   ROS: No orthopnea or PND.  Physical Exam: VS:  BP 100/60   Pulse 64   Ht _0  (1.6 m)   Wt 136 lb 3.2 oz (61.8 kg)   SpO2 97%   BMI 24.13 kg/m , BMI Body mass index is 24.13 kg/m.  Wt Readings from Last 3 Encounters:  10/23/20 136 lb 3.2 oz (61.8 kg)  10/01/20 139 lb (63 kg)  09/10/20 139 lb (63 kg)    General: Patient appears comfortable at rest. HEENT: Conjunctiva and lids normal, wearing a mask. Neck: Supple, no elevated JVP or carotid bruits, no thyromegaly. Lungs: Clear to auscultation, nonlabored breathing at rest. Cardiac: Regular rate and rhythm, no S3 or significant systolic murmur, no pericardial rub. Extremities: No pitting edema.  ECG:  An ECG dated 07/19/2019 was personally reviewed today and demonstrated:  Ventricular paced rhythm.  Recent Labwork: 04/15/2020: TSH 1.150 07/17/2020: BUN 27; Creatinine, Ser 2.67; Potassium 4.9; Sodium 138     Component Value Date/Time   CHOL 197 03/21/2019 1519   TRIG 193 (H) 03/21/2019 1519   HDL 45 03/21/2019  1519   CHOLHDL 4.4 03/21/2019 1519   LDLCALC 118 (H) 03/21/2019 1519    Other Studies Reviewed Today:  Echocardiogram 11/25/2019:  1. Left ventricular ejection fraction, by estimation, is 20 to 25%. The  left ventricle has severely decreased function. The left ventricle  demonstrates global hypokinesis. Left ventricular diastolic parameters are  indeterminate.   2. Right ventricular systolic function is low normal. The right  ventricular size is normal. There is normal pulmonary artery systolic  pressure.   3. Left atrial size was mildly dilated.   4. Right atrial size was mildly dilated.   5. The mitral valve is normal in structure. Moderate mitral valve  regurgitation. No evidence of mitral stenosis.   6. The  aortic valve has an indeterminant number of cusps. There is mild  calcification of the aortic valve. There is mild thickening of the aortic  valve. Aortic valve regurgitation is not visualized. Mild aortic valve  stenosis. Lower than expected  gradient due to decreased LVEF   7. The inferior vena cava is normal in size with greater than 50%  respiratory variability, suggesting right atrial pressure of 3 mmHg.   Assessment and Plan:  1.  Chronic systolic heart failure with history of nonischemic cardiomyopathy and previous myocarditis.  She has a Medtronic CRT-D in place, recent thoracic impedance normal.  Currently on Toprol-XL, Entresto, Jardiance, and Lasix.  Last creatinine 2.6, not on Aldactone.  Follow-up BMET.  Keep visit in the heart failure clinic.  She is also due for a follow-up echocardiogram.  2.  Permanent atrial fibrillation with CHA2DS2-VASc score of 4.  She has a history of AV node ablation and is asymptomatic.  She remains on Coumadin with follow-up in the anticoagulation clinic.  3.  CKD stage IV, follow-up BMET.  Medication Adjustments/Labs and Tests Ordered: Current medicines are reviewed at length with the patient today.  Concerns regarding medicines are  outlined above.   Tests Ordered: No orders of the defined types were placed in this encounter.   Medication Changes: No orders of the defined types were placed in this encounter.   Disposition:  Follow up  6 months.  Signed, Satira Sark, MD, [facility] 10/23/2020 4:14 PM    [facility] [place], [place], [place] [address] Phone: [phone]; Fax: [phone]

## 2020-10-26 ENCOUNTER — Other Ambulatory Visit: Payer: Self-pay | Admitting: Family Medicine

## 2020-10-26 ENCOUNTER — Ambulatory Visit (INDEPENDENT_AMBULATORY_CARE_PROVIDER_SITE_OTHER): Payer: PPO

## 2020-10-26 DIAGNOSIS — I5022 Chronic systolic (congestive) heart failure: Secondary | ICD-10-CM | POA: Diagnosis not present

## 2020-10-26 DIAGNOSIS — Z95810 Presence of automatic (implantable) cardiac defibrillator: Secondary | ICD-10-CM | POA: Diagnosis not present

## 2020-10-27 ENCOUNTER — Telehealth: Payer: Self-pay

## 2020-10-27 NOTE — Telephone Encounter (Signed)
Remote ICM transmission received.  Attempted call to patient regarding ICM remote transmission and spoke with husband.  Patient is out of town until next week.

## 2020-10-27 NOTE — Progress Notes (Signed)
EPIC Encounter for ICM Monitoring  Patient Name: Emily [name] is a 70 y.o. female Date: 10/27/2020 Primary Care Physican: Ezequiel Essex, MD Primary Cardiologist: [facility] Electrophysiologist: Santina Evans Pacing: 98.4%  09/22/2020 Weight: 134 lbs   Time in AT/AF 24.0 hr/day (100.0%) Longest AT/AF            35 months   Battery ERI:  7 months         Attempted call to patient and unable to reach.  Husband said patient is out of town until next week. Transmission reviewed.          Optivol Thoracic impedance suggesting possible fluid accumulation starting 7/25 with a few days close to baseline.   Prescribed: Furosemide 40 mg Take 1.5 tablets (60 mg total) by mouth every morning. Potassium 20 mEq 1 tablet by mouth daily.    Labs: 07/17/2020 Creatinine 2.67, BUN 27, Potassium 4.9, Sodium 138 07/03/2020 Creatinine 2.42, BUN 32, Potassium 5.0, Sodium 137, GFR 21 06/26/2020 Creatinine 1.99, BUN 23, Potassium 5.5, Sodium 140, GFR 26  06/09/2020 Creatinine 2.41, BUN 31, Potassium 4.8, Sodium 139 05/26/2020 Creatinine 2.32, BUN 40, Potassium 4.8, Sodium 137, GFR 22  A complete set of results can be found in Results Review.   Recommendations:  No changes and encouraged to call if experiencing any fluid symptoms.   Follow-up plan: ICM clinic phone appointment on 11/09/2020 to recheck fluid levels.  91 day device clinic remote transmission 11/09/2020.      EP/Cardiology Office Visits:  12/07/2020 with Dr Aundra Dubin.    11/17/2020 with Dr Lovena Le.  Kidney specialist   Copy of ICM check sent to Dr. Lovena Le.    3 month ICM trend: 10/26/2020.    1 Year ICM trend:       [name], RN 10/27/2020 4:56 PM

## 2020-10-28 ENCOUNTER — Ambulatory Visit (HOSPITAL_COMMUNITY): Payer: PPO

## 2020-10-28 ENCOUNTER — Encounter (HOSPITAL_COMMUNITY): Payer: PPO | Admitting: Cardiology

## 2020-11-02 ENCOUNTER — Other Ambulatory Visit: Payer: Self-pay | Admitting: Family Medicine

## 2020-11-05 NOTE — Progress Notes (Signed)
SUBJECTIVE:   CHIEF COMPLAINT / HPI:   Thyroid - no complaints - current regimen of levothyroxine 88 mcg daily - has issues with pharmacy having to call clinic for refill requests often  Gout - "a lot of gout" - big toe, little toe, wrist - insurance provider called [facility] came to house for evaluation and prescribed prednisone for wrist gout - ED doc wouldn't put her on allopurinol until she cleared her gout flare - has been on allopurinol BID for weeks now and feels great, no complaints  Diabetes - A1c 8.8 today, down from 9.9 10 months ago - has been taking cinnamon supplement - rare spells of hypoglycemia, last week was down to 61 - current regimen toujeo 24 units daily - previously PCP wanted to increase but unable to because of med cost  HLD - last lipid panel 03/21/19 demonstrated LDL 118 and trig 193 - current regimen includes simvastatin 20, tolerating well  - patient is in secondary prevention category given hx CAD (goals of LDL <70 and trig <150)   PERTINENT  PMH / PSH: T2DM, A. Fib s/p ICD placement, HFrEF (EF 20-25%), HTN, CKD stage 4, hypothyroidism, Gilbert's disease, HLD  OBJECTIVE:   BP 135/69   Pulse 84   Ht '5\' 3"'$  (1.6 m)   Wt 136 lb 12.8 oz (62.1 kg)   SpO2 100%   BMI 24.23 kg/m    PHQ-9:  Depression screen [facility] 2/9 11/06/2020 12/31/2019 03/21/2019  Decreased Interest 0 0 0  Down, Depressed, Hopeless 0 0 0  PHQ - 2 Score 0 0 0  Altered sleeping 0 0 -  Tired, decreased energy 2 3 -  Change in appetite 0 0 -  Feeling bad or failure about yourself  0 0 -  Trouble concentrating 0 0 -  Moving slowly or fidgety/restless 1 0 -  Suicidal thoughts 0 0 -  PHQ-9 Score 3 3 -  Difficult doing work/chores - Not difficult at all -     GAD-7: No flowsheet data found.   Physical Exam General: Awake, alert, oriented Cardiovascular: Regular rate and rhythm, S1 and S2 present, no murmurs auscultated Respiratory: Lung fields clear to auscultation  bilaterally Extremities: No bilateral lower extremity edema, palpable pedal and pretibial pulses bilaterally Neuro: Cranial nerves II through X grossly intact, able to move all extremities spontaneously   ASSESSMENT/PLAN:   Type 2 diabetes mellitus with diabetic chronic kidney disease ([place]) Currently on Toujeo 24 units daily. A1c today 8.8, down from 9.9 about 10 months ago. She has started taking a cinnamon supplement and believes her A1c is decreased due to this. Additional changes to medication regimen limited by med cost. Will send to clinical pharmacy team for help with med management and financial assistance for meds. Patient amenable to working with pharmacy team, as long as they don't ask her to disclose her husband's salary (she reports he is very protective of his personal information so she will disclose her own yearly income, but not that of her husband).   Hypothyroidism Stable on levothyroxine 88 mcg. No complaints. Will send 30 day script to pharmacy with 11 refills to avoid problems with the pharmacy contacting us for frequent refills.   Hypercholesterolemia Last lipid panel 03/21/19 demonstrated LDL 118 and trig 193. Current regimen of simvastatin 20. Patient requires secondary prevention due to CAD (goals of LDL <70 and trig <150). Patient not fasting at this time. Will send with [facility] order slip so she may have fasting labs  drawn at the [facility] in [place], much closer to her home.      Ezequiel Essex, MD [place]

## 2020-11-06 ENCOUNTER — Ambulatory Visit (INDEPENDENT_AMBULATORY_CARE_PROVIDER_SITE_OTHER): Payer: PPO | Admitting: Family Medicine

## 2020-11-06 ENCOUNTER — Encounter: Payer: Self-pay | Admitting: Family Medicine

## 2020-11-06 ENCOUNTER — Other Ambulatory Visit: Payer: Self-pay

## 2020-11-06 VITALS — BP 135/69 | HR 84 | Ht 63.0 in | Wt 136.8 lb

## 2020-11-06 DIAGNOSIS — Z794 Long term (current) use of insulin: Secondary | ICD-10-CM | POA: Diagnosis not present

## 2020-11-06 DIAGNOSIS — E039 Hypothyroidism, unspecified: Secondary | ICD-10-CM

## 2020-11-06 DIAGNOSIS — E1122 Type 2 diabetes mellitus with diabetic chronic kidney disease: Secondary | ICD-10-CM | POA: Diagnosis not present

## 2020-11-06 DIAGNOSIS — N184 Chronic kidney disease, stage 4 (severe): Secondary | ICD-10-CM | POA: Diagnosis not present

## 2020-11-06 DIAGNOSIS — I2510 Atherosclerotic heart disease of native coronary artery without angina pectoris: Secondary | ICD-10-CM

## 2020-11-06 DIAGNOSIS — E7800 Pure hypercholesterolemia, unspecified: Secondary | ICD-10-CM | POA: Diagnosis not present

## 2020-11-06 DIAGNOSIS — Z23 Encounter for immunization: Secondary | ICD-10-CM | POA: Diagnosis not present

## 2020-11-06 LAB — POCT GLYCOSYLATED HEMOGLOBIN (HGB A1C): HbA1c, POC (controlled diabetic range): 8.8 % — AB (ref 0.0–7.0)

## 2020-11-06 MED ORDER — LEVOTHYROXINE SODIUM 88 MCG PO TABS: 88.0000 ug | ORAL_TABLET | Freq: Every day | ORAL | 3 refills | Status: DC

## 2020-11-06 MED ORDER — LEVOTHYROXINE SODIUM 88 MCG PO TABS: 88.0000 ug | ORAL_TABLET | Freq: Every day | ORAL | 11 refills | Status: DC

## 2020-11-06 NOTE — Patient Instructions (Signed)
It was wonderful to see you today. Thank you for allowing me to be a part of your care. Below is a short summary of what we discussed at your visit today:  Thyroid I have sent a new prescription for your thyroid medicine.  Is a 30-day prescription with 11 refills.  This should help you avoid any difficulties with the pharmacy refilling your prescription.  Lab work Please take the printed lab slip to your [facility] in [place].  We will draw a couple labs, to check your cholesterol and also your kidney function.  For your cholesterol test, you will need to be fasting for at least 8 hours.  Diabetes Your A1c today was 8.8, down from 9.9 approximately 10 months ago.  While this is improving, it is still high.  I will reach out to our pharmacy team here and see if they can connect you with resources regarding medicine affordability.    Please bring all of your medications to every appointment!  If you have any questions or concerns, please do not hesitate to contact us via phone or MyChart message.   Ezequiel Essex, MD

## 2020-11-08 NOTE — Assessment & Plan Note (Signed)
Last lipid panel 03/21/19 demonstrated LDL 118 and trig 193. Current regimen of simvastatin 20. Patient requires secondary prevention due to CAD (goals of LDL <70 and trig <150). Patient not fasting at this time. Will send with [facility] order slip so she may have fasting labs drawn at the [facility] in [place], much closer to her home.

## 2020-11-08 NOTE — Assessment & Plan Note (Signed)
Stable on levothyroxine 88 mcg. No complaints. Will send 30 day script to pharmacy with 11 refills to avoid problems with the pharmacy contacting us for frequent refills.

## 2020-11-08 NOTE — Assessment & Plan Note (Signed)
Currently on Toujeo 24 units daily. A1c today 8.8, down from 9.9 about 10 months ago. She has started taking a cinnamon supplement and believes her A1c is decreased due to this. Additional changes to medication regimen limited by med cost. Will send to clinical pharmacy team for help with med management and financial assistance for meds. Patient amenable to working with pharmacy team, as long as they don't ask her to disclose her husband's salary (she reports he is very protective of his personal information so she will disclose her own yearly income, but not that of her husband).

## 2020-11-09 ENCOUNTER — Ambulatory Visit (INDEPENDENT_AMBULATORY_CARE_PROVIDER_SITE_OTHER): Payer: PPO

## 2020-11-09 ENCOUNTER — Telehealth: Payer: Self-pay

## 2020-11-09 DIAGNOSIS — I428 Other cardiomyopathies: Secondary | ICD-10-CM

## 2020-11-09 DIAGNOSIS — I5022 Chronic systolic (congestive) heart failure: Secondary | ICD-10-CM

## 2020-11-09 DIAGNOSIS — Z95810 Presence of automatic (implantable) cardiac defibrillator: Secondary | ICD-10-CM

## 2020-11-09 LAB — CUP PACEART REMOTE DEVICE CHECK
Battery Remaining Longevity: 5 mo
Battery Voltage: 2.82 V
Brady Statistic RA Percent Paced: 0.01 %
Brady Statistic RV Percent Paced: 98.5 %
Date Time Interrogation Session: 20220911044222
HighPow Impedance: 61 Ohm
Implantable Lead Implant Date: 20050218
Implantable Lead Implant Date: 20050218
Implantable Lead Implant Date: 20050218
Implantable Lead Location: 753858
Implantable Lead Location: 753859
Implantable Lead Location: 753860
Implantable Lead Model: 4194
Implantable Lead Model: 5076
Implantable Lead Model: 6947
Implantable Pulse Generator Implant Date: 20160413
Lead Channel Impedance Value: 361 Ohm
Lead Channel Impedance Value: 399 Ohm
Lead Channel Impedance Value: 399 Ohm
Lead Channel Impedance Value: 551 Ohm
Lead Channel Impedance Value: 608 Ohm
Lead Channel Impedance Value: 836 Ohm
Lead Channel Pacing Threshold Amplitude: 0.75 V
Lead Channel Pacing Threshold Amplitude: 0.875 V
Lead Channel Pacing Threshold Amplitude: 1.25 V
Lead Channel Pacing Threshold Pulse Width: 0.4 ms
Lead Channel Pacing Threshold Pulse Width: 0.4 ms
Lead Channel Pacing Threshold Pulse Width: 0.4 ms
Lead Channel Sensing Intrinsic Amplitude: 0.375 mV
Lead Channel Sensing Intrinsic Amplitude: 0.375 mV
Lead Channel Sensing Intrinsic Amplitude: 4.125 mV
Lead Channel Sensing Intrinsic Amplitude: 4.125 mV
Lead Channel Setting Pacing Amplitude: 2 V
Lead Channel Setting Pacing Amplitude: 2.25 V
Lead Channel Setting Pacing Amplitude: 2.75 V
Lead Channel Setting Pacing Pulse Width: 0.4 ms
Lead Channel Setting Pacing Pulse Width: 0.4 ms
Lead Channel Setting Sensing Sensitivity: 0.3 mV

## 2020-11-09 NOTE — Telephone Encounter (Signed)
Carelink alert received- Device is 5 months until ERI.  Spoke with pt.  Educated on tone that device will make when battery reached ERI, advised it is not an emergency as long as she is feeling well.  Give [place] a call.  Monthly battery checks established, next scheduled check 12/10/20.    Patient scheduled for OV with Dr. Lovena Le on 11/17/20.

## 2020-11-10 DIAGNOSIS — I2510 Atherosclerotic heart disease of native coronary artery without angina pectoris: Secondary | ICD-10-CM | POA: Diagnosis not present

## 2020-11-10 DIAGNOSIS — E1122 Type 2 diabetes mellitus with diabetic chronic kidney disease: Secondary | ICD-10-CM | POA: Diagnosis not present

## 2020-11-10 DIAGNOSIS — E7800 Pure hypercholesterolemia, unspecified: Secondary | ICD-10-CM | POA: Diagnosis not present

## 2020-11-10 DIAGNOSIS — Z794 Long term (current) use of insulin: Secondary | ICD-10-CM | POA: Diagnosis not present

## 2020-11-10 DIAGNOSIS — N184 Chronic kidney disease, stage 4 (severe): Secondary | ICD-10-CM | POA: Diagnosis not present

## 2020-11-11 ENCOUNTER — Ambulatory Visit: Payer: PPO | Admitting: Orthopedic Surgery

## 2020-11-11 LAB — COMPREHENSIVE METABOLIC PANEL
ALT: 8 IU/L (ref 0–32)
AST: 15 IU/L (ref 0–40)
Albumin/Globulin Ratio: 1.6 (ref 1.2–2.2)
Albumin: 4.3 g/dL (ref 3.7–4.7)
Alkaline Phosphatase: 80 IU/L (ref 44–121)
BUN/Creatinine Ratio: 14 (ref 12–28)
BUN: 24 mg/dL (ref 8–27)
Bilirubin Total: 0.7 mg/dL (ref 0.0–1.2)
CO2: 21 mmol/L (ref 20–29)
Calcium: 8.4 mg/dL — ABNORMAL LOW (ref 8.7–10.3)
Chloride: 99 mmol/L (ref 96–106)
Creatinine, Ser: 1.73 mg/dL — ABNORMAL HIGH (ref 0.57–1.00)
Globulin, Total: 2.7 g/dL (ref 1.5–4.5)
Glucose: 136 mg/dL — ABNORMAL HIGH (ref 65–99)
Potassium: 4.3 mmol/L (ref 3.5–5.2)
Sodium: 140 mmol/L (ref 134–144)
Total Protein: 7 g/dL (ref 6.0–8.5)
eGFR: 31 mL/min/{1.73_m2} — ABNORMAL LOW (ref >59)

## 2020-11-11 LAB — LIPID PANEL
Chol/HDL Ratio: 4.6 ratio — ABNORMAL HIGH (ref 0.0–4.4)
Cholesterol, Total: 156 mg/dL (ref 100–199)
HDL: 34 mg/dL — ABNORMAL LOW (ref >39)
LDL Chol Calc (NIH): 90 mg/dL (ref 0–99)
Triglycerides: 186 mg/dL — ABNORMAL HIGH (ref 0–149)
VLDL Cholesterol Cal: 32 mg/dL (ref 5–40)

## 2020-11-11 NOTE — Progress Notes (Signed)
EPIC Encounter for ICM Monitoring  Patient Name: Emily [name] is a 70 y.o. female Date: 11/11/2020 Primary Care Physican: Ezequiel Essex, MD Primary Cardiologist: [place] Electrophysiologist: Santina Evans Pacing: 97.2%  09/22/2020 Weight: 134 lbs   Time in AT/AF 24.0 hr/day (100.0%) Longest AT/AF            36 months   Battery ERI:  5 months         Spoke with patient and heart failure questions reviewed.  Pt asymptomatic for fluid accumulation and feeling well.         Optivol Thoracic impedance suggesting fluid returned to normal.   Prescribed: Furosemide 40 mg Take 1.5 tablets (60 mg total) by mouth every morning. Potassium 20 mEq 1 tablet by mouth daily.    Labs: 07/17/2020 Creatinine 2.67, BUN 27, Potassium 4.9, Sodium 138 07/03/2020 Creatinine 2.42, BUN 32, Potassium 5.0, Sodium 137, GFR 21 06/26/2020 Creatinine 1.99, BUN 23, Potassium 5.5, Sodium 140, GFR 26  06/09/2020 Creatinine 2.41, BUN 31, Potassium 4.8, Sodium 139 05/26/2020 Creatinine 2.32, BUN 40, Potassium 4.8, Sodium 137, GFR 22  A complete set of results can be found in Results Review.   Recommendations:  No changes and encouraged to call if experiencing any fluid symptoms.   Follow-up plan: ICM clinic phone appointment on 12/14/2020.  91 day device clinic remote transmission 02/08/2021.      EP/Cardiology Office Visits:  12/07/2020 with Dr Aundra Dubin.    11/17/2020 with Dr Lovena Le.     Copy of ICM check sent to Dr. Lovena Le.     3 month ICM trend: 11/09/2020.    1 Year ICM trend:       [name], RN 11/11/2020 3:27 PM

## 2020-11-16 ENCOUNTER — Other Ambulatory Visit (HOSPITAL_COMMUNITY): Payer: Self-pay | Admitting: Cardiology

## 2020-11-16 DIAGNOSIS — I4821 Permanent atrial fibrillation: Secondary | ICD-10-CM

## 2020-11-16 DIAGNOSIS — Z5181 Encounter for therapeutic drug level monitoring: Secondary | ICD-10-CM

## 2020-11-17 ENCOUNTER — Ambulatory Visit: Payer: PPO | Admitting: Internal Medicine

## 2020-11-17 ENCOUNTER — Encounter: Payer: Self-pay | Admitting: Internal Medicine

## 2020-11-17 ENCOUNTER — Other Ambulatory Visit: Payer: Self-pay

## 2020-11-17 VITALS — BP 110/78 | HR 88 | Ht 63.0 in | Wt 136.0 lb

## 2020-11-17 DIAGNOSIS — I5023 Acute on chronic systolic (congestive) heart failure: Secondary | ICD-10-CM

## 2020-11-17 LAB — CUP PACEART INCLINIC DEVICE CHECK
Battery Remaining Longevity: 5 mo
Battery Voltage: 2.82 V
Brady Statistic RA Percent Paced: 0.01 %
Brady Statistic RV Percent Paced: 98.23 %
Date Time Interrogation Session: 20220920100411
HighPow Impedance: 66 Ohm
Implantable Lead Implant Date: 20050218
Implantable Lead Implant Date: 20050218
Implantable Lead Implant Date: 20050218
Implantable Lead Location: 753858
Implantable Lead Location: 753859
Implantable Lead Location: 753860
Implantable Lead Model: 4194
Implantable Lead Model: 5076
Implantable Lead Model: 6947
Implantable Pulse Generator Implant Date: 20160413
Lead Channel Impedance Value: 399 Ohm
Lead Channel Impedance Value: 399 Ohm
Lead Channel Impedance Value: 418 Ohm
Lead Channel Impedance Value: 608 Ohm
Lead Channel Impedance Value: 703 Ohm
Lead Channel Impedance Value: 931 Ohm
Lead Channel Pacing Threshold Amplitude: 0.75 V
Lead Channel Pacing Threshold Amplitude: 0.875 V
Lead Channel Pacing Threshold Amplitude: 1.375 V
Lead Channel Pacing Threshold Pulse Width: 0.4 ms
Lead Channel Pacing Threshold Pulse Width: 0.4 ms
Lead Channel Pacing Threshold Pulse Width: 0.4 ms
Lead Channel Sensing Intrinsic Amplitude: 0.375 mV
Lead Channel Sensing Intrinsic Amplitude: 0.5 mV
Lead Channel Sensing Intrinsic Amplitude: 5 mV
Lead Channel Sensing Intrinsic Amplitude: 5 mV
Lead Channel Setting Pacing Amplitude: 2 V
Lead Channel Setting Pacing Amplitude: 2.25 V
Lead Channel Setting Pacing Amplitude: 2.75 V
Lead Channel Setting Pacing Pulse Width: 0.4 ms
Lead Channel Setting Pacing Pulse Width: 0.4 ms
Lead Channel Setting Sensing Sensitivity: 0.3 mV

## 2020-11-17 NOTE — Progress Notes (Signed)
HPI Emily [name] returns today for followup. She is a pleasant 70 yo woman with a h/o both atrial and ventricular arrhythmias. She has undergone AV node ablation. She has persistent atrial fib. She has a h/o dietary indiscretion and has undergone adjustments of her meds and dietary change. She has managed to avoid the hospital in the last year. She has unfortunately developed gout in her left big toe. Allergies  Allergen Reactions   Amiodarone Rash   Codeine Other (See Comments)    Euphoria, hallucinations   Other Palpitations    Other reaction(s): Other (See Comments) PT STATES SHE GETS REALLY HOT , DIZZY HEADED, HAS PASSED OUT TWICE , AND ITCHING WITH IV DYE Tumeric   Iodine Rash    topical     Current Outpatient Medications  Medication Sig Dispense Refill   acetaminophen (TYLENOL) 500 MG tablet Take 1,000 mg by mouth every 6 (six) hours as needed for mild pain.     allopurinol (ZYLOPRIM) 100 MG tablet Take 100 mg by mouth 2 (two) times daily.     Calcium Carb-Cholecalciferol (CALCIUM 600-D PO) Take 1 tablet by mouth daily.      empagliflozin (JARDIANCE) 10 MG TABS tablet Take 1 tablet (10 mg total) by mouth daily before breakfast. 30 tablet 11   furosemide (LASIX) 40 MG tablet Take 1.5 tablets (60 mg total) by mouth every morning. 225 tablet 3   Glucosamine-Chondroitin (OSTEO BI-FLEX REGULAR STRENGTH PO) Take 1 tablet by mouth daily.     levothyroxine (SYNTHROID) 88 MCG tablet Take 1 tablet (88 mcg total) by mouth daily before breakfast. 30 tablet 11   metoprolol succinate (TOPROL-XL) 50 MG 24 hr tablet Take 1.5 tablets (75 mg total) by mouth daily. . 45 tablet 6   Omega-3 Fatty Acids (FISH OIL PO) Take 1,000 mg by mouth daily.      omeprazole (PRILOSEC) 40 MG capsule TAKE 1 CAPSULE BY MOUTH ONCE DAILY. 30 capsule 3   predniSONE (DELTASONE) 20 MG tablet Take 20 mg by mouth 2 (two) times daily with a meal.     sacubitril-valsartan (ENTRESTO) 24-26 MG Take 1 tablet by mouth 2  (two) times daily. Needs doctor appointment for further refill. 120 tablet 0   simvastatin (ZOCOR) 20 MG tablet TAKE 1 TABLET EVERY DAY AT 6 PM 30 tablet 3   TOUJEO SOLOSTAR 300 UNIT/ML Solostar Pen INJECT 24 UNITS INTO THE SKIN EVERY DAY 4.5 mL 0   warfarin (COUMADIN) 2 MG tablet TAKE 1 TABLET DAILY EXCEPT 2 TABLETS ON MONDAYS AND THURSDAYS. 40 tablet 3   No current facility-administered medications for this visit.     Past Medical History:  Diagnosis Date   Acute pain of right shoulder 03/22/2019   Acute renal failure ([place]) 04/15/2012   Last Assessment & Plan:  Superimposed on CK D stage III, Baseline Cr ~2-2.5, remains above baseline, felt to be due to Cardiorenal syndrome, creatinine trending down at time of discharge Negative urine eos and no urinary retention ACE inhibitor discontinued Follows with Dr Karen Chafe as outpatient, to have labs drawn 08/15/16 and results sent to [facility] office for review   Anemia 12/29/2016   Biventricular ICD (implantable cardioverter-defibrillator) in place    CHF (congestive heart failure) ([place])    CKD (chronic kidney disease), stage IV ([place])    Colitis 09/11/2012   Complete heart block ([place])    Essential hypertension    Hypomagnesemia 04/10/2016   Hyponatremia 04/10/2016   Last Assessment & Plan:  -  improved after tolvaptan 6/29, chiefly driven by CHF, further improvement with ongoing IV Lasix - continue low Na and fluid restriction - compliance with sodium restriction a major barrier to keeping patient out of hospital and compensated, remains resistant to this philosophy while inpatient - follows with Dr Karen Chafe as outpatient, Neph consult not indicated at this ti   Localized macular rash 08/11/2016   Last Assessment & Plan:  Seems to trend with heart failure, though she has more findings behind bilateral knees and up into her thigh/groin area than when I last saw her Would use steroid cream for now, consider alternative diagnosis if no improvement    Nonischemic cardiomyopathy ([place])    On amiodarone therapy 08/21/2017   Persistent atrial fibrillation ([place])    AV node ablation 2019   Restless leg syndrome 03/22/2019   Type 2 diabetes mellitus ([place])     ROS:   All systems reviewed and negative except as noted in the HPI.   Past Surgical History:  Procedure Laterality Date   ABLATION     AV NODE ABLATION N/A 08/08/2017   Procedure: AV NODE ABLATION;  Surgeon: Thompson Grayer, MD;  Location: [facility];  Service: Cardiovascular;  Laterality: N/A;   BREAST BIOPSY     CARDIAC DEFIBRILLATOR PLACEMENT  2005   MDT Viva XT CRT-D BiV ICD implanted by Dr Rosaland Lao in [place]   CATARACT EXTRACTION Bilateral    CHOLECYSTECTOMY     THYROIDECTOMY     TOTAL VAGINAL HYSTERECTOMY       Family History  Problem Relation Age of Onset   Hip fracture Mother    COPD Father    CAD Sister    Heart attack Sister      Social History   Socioeconomic History   Marital status: Married    Spouse name: Not on file   Number of children: Not on file   Years of education: Not on file   Highest education level: Not on file  Occupational History   Not on file  Tobacco Use   Smoking status: Never   Smokeless tobacco: Never  Vaping Use   Vaping Use: Never used  Substance and Sexual Activity   Alcohol use: No   Drug use: No   Sexual activity: Not on file  Other Topics Concern   Not on file  Social History Narrative   Not on file   Social Determinants of Health   Financial Resource Strain: Not on file  Food Insecurity: Not on file  Transportation Needs: Not on file  Physical Activity: Not on file  Stress: Not on file  Social Connections: Not on file  Intimate Partner Violence: Not on file     BP 110/78   Pulse 88   Ht '5\' 3"'$  (1.6 m)   Wt 136 lb (61.7 kg)   SpO2 98%   BMI 24.09 kg/m   Physical Exam:  Well appearing NAD HEENT: Unremarkable Neck:  No JVD, no thyromegally Lymphatics:  No adenopathy Back:  No CVA  tenderness Lungs:  Clear HEART:  Regular rate rhythm, no murmurs, no rubs, no clicks Abd:  soft, positive bowel sounds, no organomegally, no rebound, no guarding Ext:  2 plus pulses, no edema, no cyanosis, no clubbing;left big toe is swollen and erythematous. Skin:  No rashes no nodules Neuro:  CN II through XII intact, motor grossly intact  DEVICE  Normal device function.  See PaceArt for details.   Assess/Plan:  1. Atrial fib -  her rates are well controlled. No change in meds. 2. Biv ICD - her medtronic device is working normally. We will recheck in several months. 3. Chronic systolic heart failure - her symptoms are class 2A. I encouraged her to avoid salty foods. 4. VT - she has not had any since her last check. 5. Gout - she is encouraged to avoid foods high in purines and we discussed. She is taking prednisone.     Carleene Overlie Ruble Buttler,MD

## 2020-11-17 NOTE — Patient Instructions (Signed)
Medication Instructions:  Your physician recommends that you continue on your current medications as directed. Please refer to the Current Medication list given to you today.  *If you need a refill on your cardiac medications before your next appointment, please call your pharmacy*   Lab Work: NONE   If you have labs (blood work) drawn today and your tests are completely normal, you will receive your results only by: [place] (if you have MyChart) OR A paper copy in the mail If you have any lab test that is abnormal or we need to change your treatment, we will call you to review the results.   Testing/Procedures: NONE    Follow-Up: At [facility], you and your health needs are our priority.  As part of our continuing mission to provide you with exceptional heart care, we have created designated Provider Care Teams.  These Care Teams include your primary Cardiologist (physician) and Advanced Practice Providers (APPs -  Physician Assistants and Nurse Practitioners) who all work together to provide you with the care you need, when you need it.  We recommend signing up for the patient portal called "MyChart".  Sign up information is provided on this After Visit Summary.  MyChart is used to connect with patients for Virtual Visits (Telemedicine).  Patients are able to view lab/test results, encounter notes, upcoming appointments, etc.  Non-urgent messages can be sent to your provider as well.   To learn more about what you can do with MyChart, go to [email or web address].    Your next appointment:   8 month(s)  The format for your next appointment:   In Person  Provider:   Cristopher Peru, MD   Other Instructions Thank you for choosing [facility]!

## 2020-11-18 NOTE — Progress Notes (Signed)
Remote ICD transmission.   

## 2020-11-23 ENCOUNTER — Ambulatory Visit: Payer: PPO | Admitting: Orthopedic Surgery

## 2020-11-23 ENCOUNTER — Ambulatory Visit (INDEPENDENT_AMBULATORY_CARE_PROVIDER_SITE_OTHER): Payer: PPO | Admitting: *Deleted

## 2020-11-23 DIAGNOSIS — I4891 Unspecified atrial fibrillation: Secondary | ICD-10-CM

## 2020-11-23 DIAGNOSIS — Z5181 Encounter for therapeutic drug level monitoring: Secondary | ICD-10-CM | POA: Diagnosis not present

## 2020-11-23 DIAGNOSIS — I513 Intracardiac thrombosis, not elsewhere classified: Secondary | ICD-10-CM | POA: Diagnosis not present

## 2020-11-23 LAB — POCT INR: INR: 3.5 — AB (ref 2.0–3.0)

## 2020-11-23 NOTE — Patient Instructions (Signed)
Hold warfarin tonight then resume 1 tablet daily except 2 tablets on Mondays and Thursdays Eat greens/salad tonight Recheck in 4 weeks

## 2020-11-25 ENCOUNTER — Ambulatory Visit: Payer: PPO | Admitting: Pharmacist

## 2020-11-30 ENCOUNTER — Other Ambulatory Visit (HOSPITAL_COMMUNITY): Payer: Self-pay | Admitting: Cardiology

## 2020-12-07 ENCOUNTER — Ambulatory Visit (HOSPITAL_BASED_OUTPATIENT_CLINIC_OR_DEPARTMENT_OTHER)
Admission: RE | Admit: 2020-12-07 | Discharge: 2020-12-07 | Disposition: A | Discharge status: Home / Self Care | Payer: PPO | Source: Ambulatory Visit | Attending: Cardiology | Admitting: Cardiology

## 2020-12-07 ENCOUNTER — Ambulatory Visit (HOSPITAL_COMMUNITY)
Admission: RE | Admit: 2020-12-07 | Discharge: 2020-12-07 | Disposition: A | Discharge status: Home / Self Care | Payer: PPO | Source: Ambulatory Visit | Attending: Cardiology | Admitting: Cardiology

## 2020-12-07 ENCOUNTER — Other Ambulatory Visit: Payer: Self-pay | Admitting: Family Medicine

## 2020-12-07 ENCOUNTER — Other Ambulatory Visit (HOSPITAL_COMMUNITY): Payer: Self-pay | Admitting: Cardiology

## 2020-12-07 ENCOUNTER — Other Ambulatory Visit: Payer: Self-pay

## 2020-12-07 VITALS — BP 140/62 | HR 94 | Ht 63.0 in | Wt 135.8 lb

## 2020-12-07 DIAGNOSIS — I083 Combined rheumatic disorders of mitral, aortic and tricuspid valves: Secondary | ICD-10-CM | POA: Insufficient documentation

## 2020-12-07 DIAGNOSIS — I5022 Chronic systolic (congestive) heart failure: Secondary | ICD-10-CM

## 2020-12-07 DIAGNOSIS — Z7901 Long term (current) use of anticoagulants: Secondary | ICD-10-CM | POA: Insufficient documentation

## 2020-12-07 DIAGNOSIS — Z8249 Family history of ischemic heart disease and other diseases of the circulatory system: Secondary | ICD-10-CM | POA: Insufficient documentation

## 2020-12-07 DIAGNOSIS — M109 Gout, unspecified: Secondary | ICD-10-CM | POA: Diagnosis not present

## 2020-12-07 DIAGNOSIS — I4821 Permanent atrial fibrillation: Secondary | ICD-10-CM | POA: Insufficient documentation

## 2020-12-07 DIAGNOSIS — Z794 Long term (current) use of insulin: Secondary | ICD-10-CM | POA: Insufficient documentation

## 2020-12-07 DIAGNOSIS — Z79899 Other long term (current) drug therapy: Secondary | ICD-10-CM | POA: Diagnosis not present

## 2020-12-07 DIAGNOSIS — Z79890 Hormone replacement therapy (postmenopausal): Secondary | ICD-10-CM | POA: Insufficient documentation

## 2020-12-07 DIAGNOSIS — I428 Other cardiomyopathies: Secondary | ICD-10-CM | POA: Diagnosis not present

## 2020-12-07 DIAGNOSIS — I130 Hypertensive heart and chronic kidney disease with heart failure and stage 1 through stage 4 chronic kidney disease, or unspecified chronic kidney disease: Secondary | ICD-10-CM | POA: Insufficient documentation

## 2020-12-07 DIAGNOSIS — E1122 Type 2 diabetes mellitus with diabetic chronic kidney disease: Secondary | ICD-10-CM | POA: Diagnosis not present

## 2020-12-07 DIAGNOSIS — I493 Ventricular premature depolarization: Secondary | ICD-10-CM | POA: Diagnosis not present

## 2020-12-07 DIAGNOSIS — N184 Chronic kidney disease, stage 4 (severe): Secondary | ICD-10-CM | POA: Insufficient documentation

## 2020-12-07 DIAGNOSIS — I4891 Unspecified atrial fibrillation: Secondary | ICD-10-CM

## 2020-12-07 DIAGNOSIS — M10042 Idiopathic gout, left hand: Secondary | ICD-10-CM | POA: Diagnosis not present

## 2020-12-07 LAB — ECHOCARDIOGRAM COMPLETE
AR max vel: 0.83 cm2
AV Area VTI: 0.76 cm2
AV Area mean vel: 0.8 cm2
AV Mean grad: 9 mmHg
AV Peak grad: 15.5 mmHg
Ao pk vel: 1.97 m/s
Calc EF: 40.5 %
MV M vel: 5.02 m/s
MV Peak grad: 100.8 mmHg
Radius: 0.3 cm
S' Lateral: 3.75 cm
Single Plane A2C EF: 44.8 %
Single Plane A4C EF: 37.7 %

## 2020-12-07 LAB — CBC
HCT: 38.6 % (ref 36.0–46.0)
Hemoglobin: 12.8 g/dL (ref 12.0–15.0)
MCH: 32.8 pg (ref 26.0–34.0)
MCHC: 33.2 g/dL (ref 30.0–36.0)
MCV: 99 fL (ref 80.0–100.0)
Platelets: 222 10*3/uL (ref 150–400)
RBC: 3.9 MIL/uL (ref 3.87–5.11)
RDW: 13.9 % (ref 11.5–15.5)
WBC: 8.8 10*3/uL (ref 4.0–10.5)
nRBC: 0 % (ref 0.0–0.2)

## 2020-12-07 LAB — BASIC METABOLIC PANEL
Anion gap: 15 (ref 5–15)
BUN: 23 mg/dL (ref 8–23)
CO2: 26 mmol/L (ref 22–32)
Calcium: 8.2 mg/dL — ABNORMAL LOW (ref 8.9–10.3)
Chloride: 98 mmol/L (ref 98–111)
Creatinine, Ser: 1.97 mg/dL — ABNORMAL HIGH (ref 0.44–1.00)
GFR, Estimated: 27 mL/min — ABNORMAL LOW (ref >60)
Glucose, Bld: 238 mg/dL — ABNORMAL HIGH (ref 70–99)
Potassium: 3.8 mmol/L (ref 3.5–5.1)
Sodium: 139 mmol/L (ref 135–145)

## 2020-12-07 LAB — BRAIN NATRIURETIC PEPTIDE: B Natriuretic Peptide: 462.1 pg/mL — ABNORMAL HIGH (ref 0.0–100.0)

## 2020-12-07 MED ORDER — METOPROLOL SUCCINATE ER 100 MG PO TB24: 100.0000 mg | ORAL_TABLET | Freq: Every day | ORAL | 3 refills | Status: DC

## 2020-12-07 MED ORDER — FUROSEMIDE 80 MG PO TABS: 80.0000 mg | ORAL_TABLET | Freq: Every morning | ORAL | 3 refills | Status: DC

## 2020-12-07 MED ORDER — PREDNISONE 20 MG PO TABS: 20.0000 mg | ORAL_TABLET | Freq: Every day | ORAL | 0 refills | Status: DC

## 2020-12-07 NOTE — H&P (View-Only) (Signed)
PCP: Ezequiel Essex, MD Cardiology: Dr. Domenic Polite HF Cardiology: Dr. Aundra Dubin  70 y.o. with history of CKD stage IV, permanent atrial fibrillation, and a long-standing nonischemic cardiomyopathy was referred by Dr. Domenic Polite for CHF clinic evaluation.  She first developed cardiac problems in 1988.  At that time, she was admitted with CHF and eventually underwent myocardial biopsy at [facility], per her report showing myocarditis.  Patient states that her EF has been low since then.  She had MDT CRT-D device implanted.  Echo in 7/21 showed EF 20-25%, moderate MR, mild AS. She is now in permanent atrial fibrillation and is s/p AV nodal ablation with BiV pacing. Elevated creatinine has limited medication management.    Echo was done today and reviewed, EF 25-30%, severe LV dilation, RV normal, moderate TR, mod-severe AS with mean gradient 9 mmHg but AVA 0.76 cm^2 (?low gradient severe AS), mild-moderate MR, left=>right shunt possible ASD.  She returns now for followup of CHF.  She was noted to have frequent PVCs (looking at telemetry with echo) today.  She has had frequent palpitations.  No lightheadedness.  She is short of breath with moderate activity such as cooking, washing, vacuuming, this has been worsening.  Main complaint currently is gout pain, severe in a finger on her left hand.  Allopurinol was recently increased. No chest pain.   Labs (1/21): LDL 118 Labs (9/21): K 4.4, creatinine 1.93 Labs (12/21): K 4, creatinine 1.77 Labs (2/22): TSH normal Labs (5/22): K 5, creatinine 2.42 Labs (9/22): K 4.3, creatinine 1.73, LDL 90  Medtronic device interrogation: Chronic atrial fibrillation, stable thoracic impedance, < 90% BiV pacing.   PMH: 1. Gout 2. Complete heart block: Medtronic CRT device.  3. CKD stage IV: Diabetic nephropathy.  4. Type 2 diabetes 5. Atrial fibrillation: Now permanent.   - AF ablation 4/18.  - AV nodal ablation in 2019 with MDT CRT-D.  6. Remote LV thrombus 7. Chronic  systolic CHF: Nonischemic cardiomyopathy. Medtronic CRT-D device.  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  - LHC 2004: Nonobstructive mild CAD.  - Echo (8/19): EF 10-15%, mildly dilated LV, mild-moderately dilated RV with mild-moderately decreased systolic function.  - Echo (7/21): EF 20-25%, low normal RV function, moderate MR, mild AS.  - Echo (10/22): EF 25-30%, severe LV dilation, RV normal, moderate TR, mod-severe AS with mean gradient 9 mmHg but AVA 0.76 cm^2 (?low gradient severe AS), mild-moderate MR, left=>right shunt possible ASD.  8. H/o cholecystectomy 9. Hypothyroidism 10. Hyperlipidemia 11. Gilbert's disease  SH: Married, lives in [place].  No smoking or ETOH.   Family History  Problem Relation Age of Onset   Hip fracture Mother    COPD Father    CAD Sister    Heart attack Sister    ROS: All systems reviewed and negative except as per HPI.   Current Outpatient Medications  Medication Sig Dispense Refill   acetaminophen (TYLENOL) 500 MG tablet Take 1,000 mg by mouth every 6 (six) hours as needed for mild pain.     allopurinol (ZYLOPRIM) 100 MG tablet Taking two tablets by mouth and 1 tablet in the evening     Calcium Carb-Cholecalciferol (CALCIUM 600-D PO) Take 1 tablet by mouth daily.      CINNAMON PO 500 mg. Taking one capsule by mouth daily     empagliflozin (JARDIANCE) 10 MG TABS tablet Take 1 tablet (10 mg total) by mouth daily before breakfast. 30 tablet 11   Glucosamine-Chondroitin (OSTEO BI-FLEX  REGULAR STRENGTH PO) Take 1 tablet by mouth daily.     levothyroxine (SYNTHROID) 88 MCG tablet Take 1 tablet (88 mcg total) by mouth daily before breakfast. 30 tablet 11   Omega-3 Fatty Acids (FISH OIL PO) Take 1,000 mg by mouth daily.      sacubitril-valsartan (ENTRESTO) 24-26 MG Take 1 tablet by mouth 2 (two) times daily. Needs doctor appointment for further refill. 120 tablet 0   simvastatin (ZOCOR) 20 MG tablet TAKE 1 TABLET EVERY  DAY AT 6 PM 30 tablet 3   TOUJEO SOLOSTAR 300 UNIT/ML Solostar Pen INJECT 24 UNITS INTO THE SKIN EVERY DAY 4.5 mL 0   warfarin (COUMADIN) 2 MG tablet Take 1 to 2 tablets once daily as directed by anticoagulation clinic 40 tablet 1   furosemide (LASIX) 80 MG tablet Take 1 tablet (80 mg total) by mouth every morning. 90 tablet 3   metoprolol succinate (TOPROL-XL) 100 MG 24 hr tablet Take 1 tablet (100 mg total) by mouth daily. . 90 tablet 3   omeprazole (PRILOSEC) 40 MG capsule TAKE 1 CAPSULE BY MOUTH ONCE DAILY. 30 capsule 2   predniSONE (DELTASONE) 20 MG tablet Take 1 tablet (20 mg total) by mouth daily with breakfast for 7 days. 7 tablet 0   No current facility-administered medications for this encounter.   BP 140/62   Pulse 94   Ht '5\' 3"'$  (1.6 m)   Wt 61.6 kg (135 lb 12.8 oz)   SpO2 99%   BMI 24.06 kg/m  General: NAD Neck: JVP 8 cm, no thyromegaly or thyroid nodule.  Lungs: Clear to auscultation bilaterally with normal respiratory effort. CV: Nondisplaced PMI.  Heart regular S1/S2, no S3/S4, 2/6 SEM RUSB with soft S2.  No peripheral edema.  No carotid bruit.  Normal pedal pulses.  Abdomen: Soft, nontender, no hepatosplenomegaly, no distention.  Skin: Intact without lesions or rashes.  Neurologic: Alert and oriented x 3.  Psych: Normal affect. Extremities: No clubbing or cyanosis.  HEENT: Normal.   Assessment/Plan: 1. Chronic systolic CHF: Nonischemic cardiomyopathy.  Low EF for years.  Per patient's report, she had an endomyocardial biopsy at [facility] in 1988 showing myocarditis.  Medication titration has been limited by CKD stage IV. MDT CRT-D device.   Echo in 7/21 showed EF 20-25%, moderate MR, mild AS.  Echo today showed EF 25-30%, severe LV dilation, RV normal, moderate TR, mod-severe AS with mean gradient 9 mmHg but AVA 0.76 cm^2 (?low gradient severe AS), mild-moderate MR, left=>right shunt possible ASD.  NYHA class III symptoms, worsening.  Though she is not volume overloaded by  Optivol, she does look like she has some volume overload on exam. She is BiV pacing < 90% of the time.  - I will increase Lasix to 80 mg daily.  BMET/BNP today and BMET in 10 days.   - Continue Jardiance 10 mg daily.  - Continue Entresto 24/26 bid.  - Increase Toprol XL to 100 mg daily.  - Frequent PVCs are likely the cause of decreased BiV pacing.  I will place Zio monitor x 7 days to quantify and increasing Toprol XL as above.  - Workup for aortic stenosis as below.  2.  Atrial fibrillation: Permanent.  S/p AV nodal ablation with BiV pacing.   - Continue warfarin.  3. CKD: Stage IV.  BMET today.  4. PVCs: Frequent.  Decreasing BiV pacing percentage.  As above, will get Zio monitor to quantify and increasing Toprol XL.  5. Aortic stenosis: Moderate-severe AS on  echo, I am concerned for low gradient severe AS.  She will need further workup.  - I will arrange for TEE to more closely assess valve. Discussed risks/benefits with patient and she agreed to procedure.  - I will arrange for RHC/LHC, will need pre-TAVR if stenosis is indeed severe. Discussed risks/benefits with patient and she agrees to procedure.  6. 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   - Shunt run on the above RHC to quantify.  7. Gout flare: In left hand.   - Cannot take colchicine due to rash.  - I will give her a 1 week course of prednisone 20 mg daily.   Followup with APP in 3 wks.   Loralie Champagne 12/07/2020

## 2020-12-07 NOTE — Progress Notes (Signed)
  Echocardiogram 2D Echocardiogram has been performed.  Emily [name] 12/07/2020, 11:52 AM

## 2020-12-07 NOTE — Progress Notes (Signed)
PCP: Ezequiel Essex, MD Cardiology: Dr. Domenic Polite HF Cardiology: Dr. Aundra Dubin  70 y.o. with history of CKD stage IV, permanent atrial fibrillation, and a long-standing nonischemic cardiomyopathy was referred by Dr. Domenic Polite for CHF clinic evaluation.  She first developed cardiac problems in 1988.  At that time, she was admitted with CHF and eventually underwent myocardial biopsy at [facility], per her report showing myocarditis.  Patient states that her EF has been low since then.  She had MDT CRT-D device implanted.  Echo in 7/21 showed EF 20-25%, moderate MR, mild AS. She is now in permanent atrial fibrillation and is s/p AV nodal ablation with BiV pacing. Elevated creatinine has limited medication management.    Echo was done today and reviewed, EF 25-30%, severe LV dilation, RV normal, moderate TR, mod-severe AS with mean gradient 9 mmHg but AVA 0.76 cm^2 (?low gradient severe AS), mild-moderate MR, left=>right shunt possible ASD.  She returns now for followup of CHF.  She was noted to have frequent PVCs (looking at telemetry with echo) today.  She has had frequent palpitations.  No lightheadedness.  She is short of breath with moderate activity such as cooking, washing, vacuuming, this has been worsening.  Main complaint currently is gout pain, severe in a finger on her left hand.  Allopurinol was recently increased. No chest pain.   Labs (1/21): LDL 118 Labs (9/21): K 4.4, creatinine 1.93 Labs (12/21): K 4, creatinine 1.77 Labs (2/22): TSH normal Labs (5/22): K 5, creatinine 2.42 Labs (9/22): K 4.3, creatinine 1.73, LDL 90  Medtronic device interrogation: Chronic atrial fibrillation, stable thoracic impedance, < 90% BiV pacing.   PMH: 1. Gout 2. Complete heart block: Medtronic CRT device.  3. CKD stage IV: Diabetic nephropathy.  4. Type 2 diabetes 5. Atrial fibrillation: Now permanent.   - AF ablation 4/18.  - AV nodal ablation in 2019 with MDT CRT-D.  6. Remote LV thrombus 7. Chronic  systolic CHF: Nonischemic cardiomyopathy. Medtronic CRT-D device.  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  - LHC 2004: Nonobstructive mild CAD.  - Echo (8/19): EF 10-15%, mildly dilated LV, mild-moderately dilated RV with mild-moderately decreased systolic function.  - Echo (7/21): EF 20-25%, low normal RV function, moderate MR, mild AS.  - Echo (10/22): EF 25-30%, severe LV dilation, RV normal, moderate TR, mod-severe AS with mean gradient 9 mmHg but AVA 0.76 cm^2 (?low gradient severe AS), mild-moderate MR, left=>right shunt possible ASD.  8. H/o cholecystectomy 9. Hypothyroidism 10. 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  No smoking or ETOH.   Family History  Problem Relation Age of Onset   Hip fracture Mother    COPD Father    CAD Sister    Heart attack Sister    ROS: All systems reviewed and negative except as per HPI.   Current Outpatient Medications  Medication Sig Dispense Refill   acetaminophen (TYLENOL) 500 MG tablet Take 1,000 mg by mouth every 6 (six) hours as needed for mild pain.     allopurinol (ZYLOPRIM) 100 MG tablet Taking two tablets by mouth and 1 tablet in the evening     Calcium Carb-Cholecalciferol (CALCIUM 600-D PO) Take 1 tablet by mouth daily.      CINNAMON PO 500 mg. Taking one capsule by mouth daily     empagliflozin (JARDIANCE) 10 MG TABS tablet Take 1 tablet (10 mg total) by mouth daily before breakfast. 30 tablet 11   Glucosamine-Chondroitin (OSTEO BI-FLEX  REGULAR STRENGTH PO) Take 1 tablet by mouth daily.     levothyroxine (SYNTHROID) 88 MCG tablet Take 1 tablet (88 mcg total) by mouth daily before breakfast. 30 tablet 11   Omega-3 Fatty Acids (FISH OIL PO) Take 1,000 mg by mouth daily.      sacubitril-valsartan (ENTRESTO) 24-26 MG Take 1 tablet by mouth 2 (two) times daily. Needs doctor appointment for further refill. 120 tablet 0   simvastatin (ZOCOR) 20 MG tablet TAKE 1 TABLET EVERY  DAY AT 6 PM 30 tablet 3   TOUJEO SOLOSTAR 300 UNIT/ML Solostar Pen INJECT 24 UNITS INTO THE SKIN EVERY DAY 4.5 mL 0   warfarin (COUMADIN) 2 MG tablet Take 1 to 2 tablets once daily as directed by anticoagulation clinic 40 tablet 1   furosemide (LASIX) 80 MG tablet Take 1 tablet (80 mg total) by mouth every morning. 90 tablet 3   metoprolol succinate (TOPROL-XL) 100 MG 24 hr tablet Take 1 tablet (100 mg total) by mouth daily. . 90 tablet 3   omeprazole (PRILOSEC) 40 MG capsule TAKE 1 CAPSULE BY MOUTH ONCE DAILY. 30 capsule 2   predniSONE (DELTASONE) 20 MG tablet Take 1 tablet (20 mg total) by mouth daily with breakfast for 7 days. 7 tablet 0   No current facility-administered medications for this encounter.   BP 140/62   Pulse 94   Ht '5\' 3"'$  (1.6 m)   Wt 61.6 kg (135 lb 12.8 oz)   SpO2 99%   BMI 24.06 kg/m  General: NAD Neck: JVP 8 cm, no thyromegaly or thyroid nodule.  Lungs: Clear to auscultation bilaterally with normal respiratory effort. CV: Nondisplaced PMI.  Heart regular S1/S2, no S3/S4, 2/6 SEM RUSB with soft S2.  No peripheral edema.  No carotid bruit.  Normal pedal pulses.  Abdomen: Soft, nontender, no hepatosplenomegaly, no distention.  Skin: Intact without lesions or rashes.  Neurologic: Alert and oriented x 3.  Psych: Normal affect. Extremities: No clubbing or cyanosis.  HEENT: Normal.   Assessment/Plan: 1. Chronic systolic CHF: Nonischemic cardiomyopathy.  Low EF for years.  Per patient's report, she had an endomyocardial biopsy at [facility] in 1988 showing myocarditis.  Medication titration has been limited by CKD stage IV. MDT CRT-D device.   Echo in 7/21 showed EF 20-25%, moderate MR, mild AS.  Echo today showed EF 25-30%, severe LV dilation, RV normal, moderate TR, mod-severe AS with mean gradient 9 mmHg but AVA 0.76 cm^2 (?low gradient severe AS), mild-moderate MR, left=>right shunt possible ASD.  NYHA class III symptoms, worsening.  Though she is not volume overloaded by  Optivol, she does look like she has some volume overload on exam. She is BiV pacing < 90% of the time.  - I will increase Lasix to 80 mg daily.  BMET/BNP today and BMET in 10 days.   - Continue Jardiance 10 mg daily.  - Continue Entresto 24/26 bid.  - Increase Toprol XL to 100 mg daily.  - Frequent PVCs are likely the cause of decreased BiV pacing.  I will place Zio monitor x 7 days to quantify and increasing Toprol XL as above.  - Workup for aortic stenosis as below.  2.  Atrial fibrillation: Permanent.  S/p AV nodal ablation with BiV pacing.   - Continue warfarin.  3. CKD: Stage IV.  BMET today.  4. PVCs: Frequent.  Decreasing BiV pacing percentage.  As above, will get Zio monitor to quantify and increasing Toprol XL.  5. Aortic stenosis: Moderate-severe AS on  echo, I am concerned for low gradient severe AS.  She will need further workup.  - I will arrange for TEE to more closely assess valve. Discussed risks/benefits with patient and she agreed to procedure.  - I will arrange for RHC/LHC, will need pre-TAVR if stenosis is indeed severe. Discussed risks/benefits with patient and she agrees to procedure.  6. 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   - Shunt run on the above RHC to quantify.  7. Gout flare: In left hand.   - Cannot take colchicine due to rash.  - I will give her a 1 week course of prednisone 20 mg daily.   Followup with APP in 3 wks.   Loralie Champagne 12/07/2020

## 2020-12-07 NOTE — Patient Instructions (Addendum)
Labs done today. We will contact you only if your labs are abnormal.  INCREASE Lasix to '80mg'$  (1 tablet) by mouth daily.   INCREASE Metoprolol to '100mg'$  (1 tablet) by mouth daily.   START Prednisone '20mg'$  (1 tablet) by mouth daily for 7 days.   No other medication changes were made. Please continue all current medications as prescribed.  Your provider has recommended that  you wear a Zio Patch for 7 days days.  This monitor will record your heart rhythm for our review.  IF you have any symptoms while wearing the monitor please press the button.  If you have any issues with the patch or you notice a red or orange light on it please call the company at [phone].  Once you remove the patch please mail it back to the company as soon as possible so we can get the results.  Your physician recommends that you schedule a follow-up appointment in: 3 weeks with our APP Clinic here in our office.  If you have any questions or concerns before your next appointment please send [place] a message through [place] or call our office at [phone].    TO LEAVE A MESSAGE FOR THE NURSE SELECT OPTION 2, PLEASE LEAVE A MESSAGE INCLUDING: YOUR NAME DATE OF BIRTH CALL BACK NUMBER REASON FOR CALL**this is important as we prioritize the call backs  YOU WILL RECEIVE A CALL BACK THE SAME DAY AS LONG AS YOU CALL BEFORE 4:00 PM   Do the following things EVERYDAY: Weigh yourself in the morning before breakfast. Write it down and keep it in a log. Take your medicines as prescribed Eat low salt foods--Limit salt (sodium) to 2000 mg per day.  Stay as active as you can everyday Limit all fluids for the day to less than 2 liters   At the [facility], you and your health needs are our priority. As part of our continuing mission to provide you with exceptional heart care, we have created designated Provider Care Teams. These Care Teams include your primary Cardiologist (physician) and Advanced Practice  Providers (APPs- Physician Assistants and Nurse Practitioners) who all work together to provide you with the care you need, when you need it.   You may see any of the following providers on your designated Care Team at your next follow up: Dr Glori Bickers Dr Haynes Kerns, NP Lyda Jester, [place] Audry Riles, PharmD   Please be sure to bring in all your medications bottles to every appointment.   You are scheduled for a TEE & Right and Left Cardiac Catheterization on Thursday, October 20 with Dr. Loralie Champagne.  1. Please arrive at the [facility] (Main Entrance A) at [facility]: [address] [place],  [address] at 7:30 AM (This time is two hours before your procedure to ensure your preparation). Free valet parking service is available.   Special note: Every effort is made to have your procedure done on time. Please understand that emergencies sometimes delay scheduled procedures.  2. Diet: Do not eat solid foods after midnight.  The patient may have clear liquids until 5am upon the day of the procedure.   4. Medication instructions in preparation for your procedure:  The Coumadin Clinic will be in contact with you regarding your Warfarin  ON THE MORNING OF YOUR PROCEDURE DO NOT TAKE YOUR LASIX OR TOUJEO.  On the morning of your procedure, take your any morning medicines NOT listed above.  You may use  sips of water.  5. Plan for one night stay--bring personal belongings. 6. Bring a current list of your medications and current insurance cards. 7. You MUST have a responsible person to drive you home. 8. Someone MUST be with you the first 24 hours after you arrive home or your discharge will be delayed. 9. Please wear clothes that are easy to get on and off and wear slip-on shoes.  Thank you for allowing [place] to care for you!   -- [facility]

## 2020-12-14 ENCOUNTER — Ambulatory Visit (INDEPENDENT_AMBULATORY_CARE_PROVIDER_SITE_OTHER): Payer: PPO

## 2020-12-14 DIAGNOSIS — I5022 Chronic systolic (congestive) heart failure: Secondary | ICD-10-CM

## 2020-12-14 DIAGNOSIS — Z95810 Presence of automatic (implantable) cardiac defibrillator: Secondary | ICD-10-CM

## 2020-12-14 NOTE — Progress Notes (Signed)
EPIC Encounter for ICM Monitoring  Patient Name: Emily [name] is a 70 y.o. female Date: 12/14/2020 Primary Care Physican: Ezequiel Essex, MD Primary Cardiologist: [place] Electrophysiologist: Santina Evans Pacing: 88.5%  09/22/2020 Weight: 134 lbs   Time in AT/AF 24.0 hr/day (100.0%) Longest AT/AF            38 months   Battery ERI:  5 months         Spoke with patient and heart failure questions reviewed.  Pt asymptomatic for fluid accumulation and feeling well.   She has been on Prednisone for gout.  She is having cardiac cath 10/20 to look at heart valve.           Optivol Thoracic impedance suggesting possible fluid accumulation starting 12/12/2020.  She discussed fluid retention Dr Aundra Dubin 10/10 and the Prednisone may be contributing to fluid accumulation.  Dr Aundra Dubin aware and increased Furosemide on 10/10 OV.     Prescribed: Furosemide 80 mg Take 1 tablet (80 mg total) by mouth every morning.   Labs: 12/07/2020 Creatinine 1.97, BUN 23, Potassium 3.8, Sodium 139 11/10/2020 Creatinine 1.73, BUN 24, Potassium 4.3, Sodium 140, GFR 31 07/17/2020 Creatinine 2.67, BUN 27, Potassium 4.9, Sodium 138 07/03/2020 Creatinine 2.42, BUN 32, Potassium 5.0, Sodium 137, GFR 21 06/26/2020 Creatinine 1.99, BUN 23, Potassium 5.5, Sodium 140, GFR 26  06/09/2020 Creatinine 2.41, BUN 31, Potassium 4.8, Sodium 139 05/26/2020 Creatinine 2.32, BUN 40, Potassium 4.8, Sodium 137, GFR 22  A complete set of results can be found in Results Review.   Recommendations:  No changes and encouraged to call if experiencing any fluid symptoms.   Follow-up plan: ICM clinic phone appointment on 12/28/2020 to recheck fluid levels.  91 day device clinic remote transmission 02/08/2021.      EP/Cardiology Office Visits:  12/28/2020 with Dr Aundra Dubin.       Copy of ICM check sent to Dr. Lovena Le.      3 month ICM trend: 12/14/2020.    1 Year ICM trend:       [name], RN 12/14/2020 4:30 PM

## 2020-12-15 ENCOUNTER — Other Ambulatory Visit (HOSPITAL_COMMUNITY): Payer: Self-pay | Admitting: Cardiology

## 2020-12-17 ENCOUNTER — Encounter (HOSPITAL_COMMUNITY)
Admission: RE | Disposition: A | Discharge status: Home / Self Care | Payer: Self-pay | Source: Home / Self Care | Attending: Cardiology

## 2020-12-17 ENCOUNTER — Ambulatory Visit (HOSPITAL_COMMUNITY): Payer: PPO | Admitting: Certified Registered Nurse Anesthetist

## 2020-12-17 ENCOUNTER — Ambulatory Visit (HOSPITAL_COMMUNITY)
Admission: RE | Admit: 2020-12-17 | Discharge: 2020-12-17 | Disposition: A | Discharge status: Home / Self Care | Payer: PPO | Attending: Cardiology | Admitting: Cardiology

## 2020-12-17 ENCOUNTER — Ambulatory Visit (HOSPITAL_BASED_OUTPATIENT_CLINIC_OR_DEPARTMENT_OTHER): Payer: PPO

## 2020-12-17 ENCOUNTER — Encounter (HOSPITAL_COMMUNITY): Payer: Self-pay | Admitting: Cardiology

## 2020-12-17 ENCOUNTER — Other Ambulatory Visit: Payer: Self-pay

## 2020-12-17 DIAGNOSIS — Q211 Atrial septal defect, unspecified: Secondary | ICD-10-CM | POA: Diagnosis not present

## 2020-12-17 DIAGNOSIS — I080 Rheumatic disorders of both mitral and aortic valves: Secondary | ICD-10-CM | POA: Insufficient documentation

## 2020-12-17 DIAGNOSIS — N184 Chronic kidney disease, stage 4 (severe): Secondary | ICD-10-CM | POA: Diagnosis not present

## 2020-12-17 DIAGNOSIS — Z794 Long term (current) use of insulin: Secondary | ICD-10-CM | POA: Diagnosis not present

## 2020-12-17 DIAGNOSIS — I5022 Chronic systolic (congestive) heart failure: Secondary | ICD-10-CM | POA: Insufficient documentation

## 2020-12-17 DIAGNOSIS — Z79890 Hormone replacement therapy (postmenopausal): Secondary | ICD-10-CM | POA: Diagnosis not present

## 2020-12-17 DIAGNOSIS — Z7984 Long term (current) use of oral hypoglycemic drugs: Secondary | ICD-10-CM | POA: Diagnosis not present

## 2020-12-17 DIAGNOSIS — I4821 Permanent atrial fibrillation: Secondary | ICD-10-CM | POA: Diagnosis not present

## 2020-12-17 DIAGNOSIS — I272 Pulmonary hypertension, unspecified: Secondary | ICD-10-CM | POA: Insufficient documentation

## 2020-12-17 DIAGNOSIS — I428 Other cardiomyopathies: Secondary | ICD-10-CM | POA: Insufficient documentation

## 2020-12-17 DIAGNOSIS — I340 Nonrheumatic mitral (valve) insufficiency: Secondary | ICD-10-CM

## 2020-12-17 DIAGNOSIS — E1122 Type 2 diabetes mellitus with diabetic chronic kidney disease: Secondary | ICD-10-CM | POA: Diagnosis not present

## 2020-12-17 DIAGNOSIS — I2510 Atherosclerotic heart disease of native coronary artery without angina pectoris: Secondary | ICD-10-CM

## 2020-12-17 DIAGNOSIS — Z95810 Presence of automatic (implantable) cardiac defibrillator: Secondary | ICD-10-CM | POA: Insufficient documentation

## 2020-12-17 DIAGNOSIS — M109 Gout, unspecified: Secondary | ICD-10-CM | POA: Diagnosis not present

## 2020-12-17 DIAGNOSIS — Q2111 Secundum atrial septal defect: Secondary | ICD-10-CM | POA: Insufficient documentation

## 2020-12-17 DIAGNOSIS — I509 Heart failure, unspecified: Secondary | ICD-10-CM

## 2020-12-17 DIAGNOSIS — Z79899 Other long term (current) drug therapy: Secondary | ICD-10-CM | POA: Insufficient documentation

## 2020-12-17 DIAGNOSIS — Z7901 Long term (current) use of anticoagulants: Secondary | ICD-10-CM | POA: Insufficient documentation

## 2020-12-17 DIAGNOSIS — I493 Ventricular premature depolarization: Secondary | ICD-10-CM | POA: Diagnosis not present

## 2020-12-17 DIAGNOSIS — I350 Nonrheumatic aortic (valve) stenosis: Secondary | ICD-10-CM

## 2020-12-17 DIAGNOSIS — E7800 Pure hypercholesterolemia, unspecified: Secondary | ICD-10-CM | POA: Diagnosis not present

## 2020-12-17 DIAGNOSIS — I088 Other rheumatic multiple valve diseases: Secondary | ICD-10-CM | POA: Diagnosis not present

## 2020-12-17 HISTORY — PX: TEE WITHOUT CARDIOVERSION: SHX5443

## 2020-12-17 HISTORY — PX: RIGHT HEART CATH AND CORONARY ANGIOGRAPHY: CATH118264

## 2020-12-17 LAB — ECHO TEE
AR max vel: 1.17 cm2
AV Area VTI: 1.23 cm2
AV Area mean vel: 1.09 cm2
AV Mean grad: 8 mmHg
AV Peak grad: 13.3 mmHg
Ao pk vel: 1.83 m/s
MV M vel: 5.04 m/s
MV Peak grad: 101.6 mmHg
Radius: 0.4 cm

## 2020-12-17 LAB — POCT I-STAT EG7
Acid-Base Excess: 4 mmol/L — ABNORMAL HIGH (ref 0.0–2.0)
Acid-Base Excess: 4 mmol/L — ABNORMAL HIGH (ref 0.0–2.0)
Acid-Base Excess: 4 mmol/L — ABNORMAL HIGH (ref 0.0–2.0)
Acid-Base Excess: 5 mmol/L — ABNORMAL HIGH (ref 0.0–2.0)
Acid-Base Excess: 5 mmol/L — ABNORMAL HIGH (ref 0.0–2.0)
Bicarbonate: 28.6 mmol/L — ABNORMAL HIGH (ref 20.0–28.0)
Bicarbonate: 28.6 mmol/L — ABNORMAL HIGH (ref 20.0–28.0)
Bicarbonate: 29.9 mmol/L — ABNORMAL HIGH (ref 20.0–28.0)
Bicarbonate: 30.4 mmol/L — ABNORMAL HIGH (ref 20.0–28.0)
Bicarbonate: 25.6 mmol/L (ref 20.0–28.0)
Calcium, Ion: 1.01 mmol/L — ABNORMAL LOW (ref 1.15–1.40)
Calcium, Ion: 1.01 mmol/L — ABNORMAL LOW (ref 1.15–1.40)
Calcium, Ion: 1.02 mmol/L — ABNORMAL LOW (ref 1.15–1.40)
Calcium, Ion: 1.03 mmol/L — ABNORMAL LOW (ref 1.15–1.40)
Calcium, Ion: 0.94 mmol/L — ABNORMAL LOW (ref 1.15–1.40)
HCT: 34 % — ABNORMAL LOW (ref 36.0–46.0)
HCT: 33 % — ABNORMAL LOW (ref 36.0–46.0)
HCT: 34 % — ABNORMAL LOW (ref 36.0–46.0)
HCT: 35 % — ABNORMAL LOW (ref 36.0–46.0)
HCT: 31 % — ABNORMAL LOW (ref 36.0–46.0)
Hemoglobin: 11.6 g/dL — ABNORMAL LOW (ref 12.0–15.0)
Hemoglobin: 11.2 g/dL — ABNORMAL LOW (ref 12.0–15.0)
Hemoglobin: 11.6 g/dL — ABNORMAL LOW (ref 12.0–15.0)
Hemoglobin: 11.9 g/dL — ABNORMAL LOW (ref 12.0–15.0)
Hemoglobin: 10.5 g/dL — ABNORMAL LOW (ref 12.0–15.0)
O2 Saturation: 70 %
O2 Saturation: 52 %
O2 Saturation: 60 %
O2 Saturation: 75 %
O2 Saturation: 98 %
Potassium: 3.3 mmol/L — ABNORMAL LOW (ref 3.5–5.1)
Potassium: 3.2 mmol/L — ABNORMAL LOW (ref 3.5–5.1)
Potassium: 3.3 mmol/L — ABNORMAL LOW (ref 3.5–5.1)
Potassium: 3.3 mmol/L — ABNORMAL LOW (ref 3.5–5.1)
Potassium: 3.3 mmol/L — ABNORMAL LOW (ref 3.5–5.1)
Sodium: 140 mmol/L (ref 135–145)
Sodium: 140 mmol/L (ref 135–145)
Sodium: 141 mmol/L (ref 135–145)
Sodium: 141 mmol/L (ref 135–145)
Sodium: 140 mmol/L (ref 135–145)
TCO2: 30 mmol/L (ref 22–32)
TCO2: 30 mmol/L (ref 22–32)
TCO2: 31 mmol/L (ref 22–32)
TCO2: 32 mmol/L (ref 22–32)
TCO2: 26 mmol/L (ref 22–32)
pCO2, Ven: 44.1 mmHg (ref 44.0–60.0)
pCO2, Ven: 42.7 mmHg — ABNORMAL LOW (ref 44.0–60.0)
pCO2, Ven: 47.4 mmHg (ref 44.0–60.0)
pCO2, Ven: 48.8 mmHg (ref 44.0–60.0)
pCO2, Ven: 25.7 mmHg — ABNORMAL LOW (ref 44.0–60.0)
pH, Ven: 7.42 (ref 7.250–7.430)
pH, Ven: 7.402 (ref 7.250–7.430)
pH, Ven: 7.408 (ref 7.250–7.430)
pH, Ven: 7.434 — ABNORMAL HIGH (ref 7.250–7.430)
pH, Ven: 7.607 (ref 7.250–7.430)
pO2, Ven: 36 mmHg (ref 32.0–45.0)
pO2, Ven: 28 mmHg — CL (ref 32.0–45.0)
pO2, Ven: 32 mmHg (ref 32.0–45.0)
pO2, Ven: 39 mmHg (ref 32.0–45.0)
pO2, Ven: 81 mmHg — ABNORMAL HIGH (ref 32.0–45.0)

## 2020-12-17 LAB — BASIC METABOLIC PANEL
Anion gap: 12 (ref 5–15)
BUN: 25 mg/dL — ABNORMAL HIGH (ref 8–23)
CO2: 28 mmol/L (ref 22–32)
Calcium: 7.9 mg/dL — ABNORMAL LOW (ref 8.9–10.3)
Chloride: 97 mmol/L — ABNORMAL LOW (ref 98–111)
Creatinine, Ser: 2.07 mg/dL — ABNORMAL HIGH (ref 0.44–1.00)
GFR, Estimated: 25 mL/min — ABNORMAL LOW (ref >60)
Glucose, Bld: 265 mg/dL — ABNORMAL HIGH (ref 70–99)
Potassium: 3.2 mmol/L — ABNORMAL LOW (ref 3.5–5.1)
Sodium: 137 mmol/L (ref 135–145)

## 2020-12-17 LAB — CBC
HCT: 35.2 % — ABNORMAL LOW (ref 36.0–46.0)
Hemoglobin: 11.5 g/dL — ABNORMAL LOW (ref 12.0–15.0)
MCH: 32.1 pg (ref 26.0–34.0)
MCHC: 32.7 g/dL (ref 30.0–36.0)
MCV: 98.3 fL (ref 80.0–100.0)
Platelets: 274 10*3/uL (ref 150–400)
RBC: 3.58 MIL/uL — ABNORMAL LOW (ref 3.87–5.11)
RDW: 13.3 % (ref 11.5–15.5)
WBC: 7.3 10*3/uL (ref 4.0–10.5)
nRBC: 0 % (ref 0.0–0.2)

## 2020-12-17 LAB — GLUCOSE, CAPILLARY
Glucose-Capillary: 242 mg/dL — ABNORMAL HIGH (ref 70–99)
Glucose-Capillary: 210 mg/dL — ABNORMAL HIGH (ref 70–99)
Glucose-Capillary: 102 mg/dL — ABNORMAL HIGH (ref 70–99)

## 2020-12-17 LAB — PROTIME-INR
INR: 1.2 (ref 0.8–1.2)
Prothrombin Time: 15.4 seconds — ABNORMAL HIGH (ref 11.4–15.2)

## 2020-12-17 LAB — SEDIMENTATION RATE: Sed Rate: 20 mm/hr (ref 0–22)

## 2020-12-17 SURGERY — ECHOCARDIOGRAM, TRANSESOPHAGEAL
Anesthesia: Monitor Anesthesia Care

## 2020-12-17 SURGERY — RIGHT HEART CATH AND CORONARY ANGIOGRAPHY
Anesthesia: LOCAL

## 2020-12-17 MED ORDER — SODIUM CHLORIDE 0.9% FLUSH: 3.0000 mL | Freq: Two times a day (BID) | INTRAVENOUS | Status: DC

## 2020-12-17 MED ORDER — LIDOCAINE HCL (PF) 1 % IJ SOLN
INTRAMUSCULAR | Status: AC
  Filled 2020-12-17: qty 30

## 2020-12-17 MED ORDER — HYDRALAZINE HCL 20 MG/ML IJ SOLN: 10.0000 mg | INTRAMUSCULAR | Status: DC | PRN

## 2020-12-17 MED ORDER — LIDOCAINE 2% (20 MG/ML) 5 ML SYRINGE
INTRAMUSCULAR | Status: DC | PRN
  Administered 2020-12-17: 60 mg via INTRAVENOUS

## 2020-12-17 MED ORDER — PROPOFOL 500 MG/50ML IV EMUL
INTRAVENOUS | Status: DC | PRN
  Administered 2020-12-17: 75 ug/kg/min via INTRAVENOUS

## 2020-12-17 MED ORDER — MIDAZOLAM HCL 2 MG/2ML IJ SOLN
INTRAMUSCULAR | Status: AC
  Filled 2020-12-17: qty 2

## 2020-12-17 MED ORDER — SODIUM CHLORIDE 0.9% FLUSH: 3.0000 mL | INTRAVENOUS | Status: DC | PRN

## 2020-12-17 MED ORDER — SODIUM CHLORIDE 0.9 % IV SOLN: INTRAVENOUS | Status: AC

## 2020-12-17 MED ORDER — ONDANSETRON HCL 4 MG/2ML IJ SOLN: 4.0000 mg | Freq: Four times a day (QID) | INTRAMUSCULAR | Status: DC | PRN

## 2020-12-17 MED ORDER — VERAPAMIL HCL 2.5 MG/ML IV SOLN
INTRAVENOUS | Status: AC
  Filled 2020-12-17: qty 2

## 2020-12-17 MED ORDER — FENTANYL CITRATE (PF) 100 MCG/2ML IJ SOLN
INTRAMUSCULAR | Status: AC
  Filled 2020-12-17: qty 2

## 2020-12-17 MED ORDER — SODIUM CHLORIDE 0.9 % IV SOLN: 250.0000 mL | INTRAVENOUS | Status: DC | PRN

## 2020-12-17 MED ORDER — BUTAMBEN-TETRACAINE-BENZOCAINE 2-2-14 % EX AERO
INHALATION_SPRAY | CUTANEOUS | Status: DC | PRN
  Administered 2020-12-17: 2 via TOPICAL

## 2020-12-17 MED ORDER — ASPIRIN 81 MG PO CHEW: 81.0000 mg | CHEWABLE_TABLET | ORAL | Status: DC

## 2020-12-17 MED ORDER — ACETAMINOPHEN 325 MG PO TABS: 650.0000 mg | ORAL_TABLET | ORAL | Status: DC | PRN

## 2020-12-17 MED ORDER — LIDOCAINE HCL (PF) 1 % IJ SOLN
INTRAMUSCULAR | Status: DC | PRN
  Administered 2020-12-17: 2 mL via INTRADERMAL
  Administered 2020-12-17: 15 mL via INTRADERMAL
  Administered 2020-12-17 (×2): 2 mL via INTRADERMAL

## 2020-12-17 MED ORDER — DIPHENHYDRAMINE HCL 50 MG/ML IJ SOLN
25.0000 mg | Freq: Once | INTRAMUSCULAR | Status: AC
  Administered 2020-12-17: 25 mg via INTRAVENOUS

## 2020-12-17 MED ORDER — SODIUM CHLORIDE 0.9 % IV SOLN: INTRAVENOUS | Status: DC

## 2020-12-17 MED ORDER — HEPARIN (PORCINE) IN NACL 1000-0.9 UT/500ML-% IV SOLN
INTRAVENOUS | Status: AC
  Filled 2020-12-17: qty 2000

## 2020-12-17 MED ORDER — INSULIN ASPART 100 UNIT/ML IJ SOLN
INTRAMUSCULAR | Status: AC
  Filled 2020-12-17: qty 1

## 2020-12-17 MED ORDER — LABETALOL HCL 5 MG/ML IV SOLN: 10.0000 mg | INTRAVENOUS | Status: DC | PRN

## 2020-12-17 MED ORDER — HEPARIN (PORCINE) IN NACL 1000-0.9 UT/500ML-% IV SOLN
INTRAVENOUS | Status: DC | PRN
  Administered 2020-12-17 (×2): 500 mL

## 2020-12-17 MED ORDER — FENTANYL CITRATE (PF) 100 MCG/2ML IJ SOLN
INTRAMUSCULAR | Status: DC | PRN
  Administered 2020-12-17: 25 ug via INTRAVENOUS

## 2020-12-17 MED ORDER — POTASSIUM CHLORIDE CRYS ER 20 MEQ PO TBCR
40.0000 meq | EXTENDED_RELEASE_TABLET | Freq: Once | ORAL | Status: AC
  Administered 2020-12-17: 40 meq via ORAL
  Filled 2020-12-17: qty 2

## 2020-12-17 MED ORDER — MIDAZOLAM HCL 2 MG/2ML IJ SOLN
INTRAMUSCULAR | Status: DC | PRN
  Administered 2020-12-17: 1 mg via INTRAVENOUS

## 2020-12-17 MED ORDER — HEPARIN SODIUM (PORCINE) 1000 UNIT/ML IJ SOLN
INTRAMUSCULAR | Status: AC
  Filled 2020-12-17: qty 1

## 2020-12-17 MED ORDER — INSULIN ASPART 100 UNIT/ML IJ SOLN
6.0000 [IU] | Freq: Once | INTRAMUSCULAR | Status: AC
  Administered 2020-12-17: 6 [IU] via SUBCUTANEOUS
  Filled 2020-12-17: qty 0.06

## 2020-12-17 SURGICAL SUPPLY — 14 items
CATH BALLN WEDGE 5F 110CM (CATHETERS) ×1 IMPLANT
CATH INFINITI 5FR MULTPACK ANG (CATHETERS) ×1 IMPLANT
GLIDESHEATH SLEND SS 6F .021 (SHEATH) ×1 IMPLANT
GUIDEWIRE .025 260CM (WIRE) ×1 IMPLANT
GUIDEWIRE INQWIRE 1.5J.035X260 (WIRE) IMPLANT
INQWIRE 1.5J .035X260CM (WIRE) ×2
KIT HEART LEFT (KITS) ×4 IMPLANT
PACK CARDIAC CATHETERIZATION (CUSTOM PROCEDURE TRAY) ×4 IMPLANT
SHEATH GLIDE SLENDER 4/5FR (SHEATH) ×2 IMPLANT
SHEATH PINNACLE 5F 10CM (SHEATH) ×1 IMPLANT
SHEATH PROBE COVER 6X72 (BAG) ×1 IMPLANT
TRANSDUCER W/STOPCOCK (MISCELLANEOUS) ×4 IMPLANT
WIRE EMERALD 3MM-J .025X260CM (WIRE) ×2 IMPLANT
WIRE EMERALD 3MM-J .035X150CM (WIRE) ×1 IMPLANT

## 2020-12-17 NOTE — Interval H&P Note (Signed)
History and Physical Interval Note:  12/17/2020 11:34 AM  Emily [name]  has presented today for surgery, with the diagnosis of heart failure.  The various methods of treatment have been discussed with the patient and family. After consideration of risks, benefits and other options for treatment, the patient has consented to  Procedure(s): RIGHT/LEFT HEART CATH AND CORONARY ANGIOGRAPHY (N/A) as a surgical intervention.  The patient's history has been reviewed, patient examined, no change in status, stable for surgery.  I have reviewed the patient's chart and labs.  Questions were answered to the patient's satisfaction.     Elicia Lui [employer]

## 2020-12-17 NOTE — Anesthesia Postprocedure Evaluation (Signed)
Anesthesia Post Note  Patient: HONG TIMM  Procedure(s) Performed: TRANSESOPHAGEAL ECHOCARDIOGRAM (TEE)     Patient location during evaluation: Endoscopy Anesthesia Type: MAC Level of consciousness: awake and alert Pain management: pain level controlled Vital Signs Assessment: post-procedure vital signs reviewed and stable Respiratory status: spontaneous breathing, nonlabored ventilation, respiratory function stable and patient connected to nasal cannula oxygen Cardiovascular status: stable and blood pressure returned to baseline Postop Assessment: no apparent nausea or vomiting Anesthetic complications: no   No notable events documented.  Last Vitals:  Vitals:   12/17/20 1700 12/17/20 1745  BP: 119/65 (!) 126/49  Pulse: (!) 59 60  Resp:    Temp:    SpO2: 100% 99%    Last Pain:  Vitals:   12/17/20 1408  TempSrc:   PainSc: 0-No pain                 Josie Mesa

## 2020-12-17 NOTE — Interval H&P Note (Signed)
History and Physical Interval Note:  12/17/2020 9:21 AM  Emily [name]  has presented today for surgery, with the diagnosis of AORTIC STENOSIS.  The various methods of treatment have been discussed with the patient and family. After consideration of risks, benefits and other options for treatment, the patient has consented to  Procedure(s): TRANSESOPHAGEAL ECHOCARDIOGRAM (TEE) (N/A) as a surgical intervention.  The patient's history has been reviewed, patient examined, no change in status, stable for surgery.  I have reviewed the patient's chart and labs.  Questions were answered to the patient's satisfaction.     Joely Losier [employer]

## 2020-12-17 NOTE — Transfer of Care (Signed)
Immediate Anesthesia Transfer of Care Note  Patient: Emily [name]  Procedure(s) Performed: TRANSESOPHAGEAL ECHOCARDIOGRAM (TEE)  Patient Location: PACU  Anesthesia Type:MAC  Level of Consciousness: awake, alert  and oriented  Airway & Oxygen Therapy: Patient Spontanous Breathing and Patient connected to nasal cannula oxygen  Post-op Assessment: Report given to RN and Post -op Vital signs reviewed and stable  Post vital signs: Reviewed and stable  Last Vitals:  Vitals Value Taken Time  BP 113/70 12/17/20 0955  Temp    Pulse 62 12/17/20 0956  Resp 17 12/17/20 0956  SpO2 98 % 12/17/20 0956  Vitals shown include unvalidated device data.  Last Pain:  Vitals:   12/17/20 0757  TempSrc: Temporal  PainSc: 8          Complications: No notable events documented.

## 2020-12-17 NOTE — Progress Notes (Signed)
Pt ambulated without difficulty or bleeding.   Discharged home with her daughter who will drive and stay with pt x 24 hrs. 

## 2020-12-17 NOTE — Anesthesia Preprocedure Evaluation (Signed)
Anesthesia Evaluation  Patient identified by MRN, date of birth, ID band Patient awake    Reviewed: Allergy & Precautions, NPO status , Patient's Chart, lab work & pertinent test results  History of Anesthesia Complications Negative for: history of anesthetic complications  Airway Mallampati: I  TM Distance: >3 FB Neck ROM: Full    Dental  (+) Dental Advisory Given, Teeth Intact   Pulmonary neg shortness of breath, neg sleep apnea, neg COPD, neg recent URI,  Covid-19 Nucleic Acid Test Results No results found for: SARSCOV2NAA, SARSCOV2    breath sounds clear to auscultation       Cardiovascular hypertension, + CAD and +CHF  + dysrhythmias Atrial Fibrillation + Cardiac Defibrillator + Valvular Problems/Murmurs AS and MR  Rhythm:Regular  1. Left ventricular ejection fraction, by estimation, is 25 to 30%. The  left ventricle has severely decreased function. The left ventricle  demonstrates global hypokinesis. The left ventricular internal cavity size  was severely dilated. Left ventricular  diastolic parameters are indeterminate.  2. Pacing wires in RA/RV . Right ventricular systolic function is normal.  The right ventricular size is normal. There is normal pulmonary artery  systolic pressure.  3. Left atrial size was mildly dilated.  4. Fairly large left to right sund across secundum area of septum by  color flow . Agitated saline contrast bubble study was positive with  shunting observed within 3-6 cardiac cycles suggestive of interatrial  shunt.  5. Right atrial size was moderately dilated.  6. The mitral valve is abnormal. Mild to moderate mitral valve  regurgitation. No evidence of mitral stenosis. Moderate mitral annular  calcification.  7. Tricuspid valve regurgitation is moderate.  8. Gradient similar to prior echo Likely moderate to severe low flow AS.  DVI 0.3 AVA 0.83 Morphologically valve is heavily calcified  with  restricted leaflet motion . The aortic valve is calcified. There is severe  calcifcation of the aortic valve. There  is severe thickening of the aortic valve. Aortic valve regurgitation is  trivial. Moderate to severe aortic valve stenosis.  9. The inferior vena cava is dilated in size with <50% respiratory  variability, suggesting right atrial pressure of 15 mmHg.   Neuro/Psych  Headaches, negative psych ROS   GI/Hepatic Neg liver ROS, GERD  ,  Endo/Other  diabetesHypothyroidism   Renal/GU CRFRenal diseaseLab Results      Component                Value               Date                      CREATININE               2.07 (H)            12/17/2020           Lab Results      Component                Value               Date                      K                        3.2 (L)             12/17/2020  Musculoskeletal   Abdominal   Peds  Hematology Lab Results      Component                Value               Date                      WBC                      8.8                 12/07/2020                HGB                      12.8                12/07/2020                HCT                      38.6                12/07/2020                MCV                      99.0                12/07/2020                PLT                      222                 12/07/2020              Anesthesia Other Findings   Reproductive/Obstetrics                             Anesthesia Physical Anesthesia Plan  ASA: 3  Anesthesia Plan: MAC   Post-op Pain Management:    Induction: Intravenous  PONV Risk Score and Plan: 2 and Propofol infusion and Treatment may vary due to age or medical condition  Airway Management Planned: Nasal Cannula  Additional Equipment: None  Intra-op Plan:   Post-operative Plan:   Informed Consent: I have reviewed the patients History and Physical, chart, labs and discussed the procedure including the  risks, benefits and alternatives for the proposed anesthesia with the patient or authorized representative who has indicated his/her understanding and acceptance.     Dental advisory given  Plan Discussed with: CRNA and Anesthesiologist  Anesthesia Plan Comments:         Anesthesia Quick Evaluation

## 2020-12-17 NOTE — Discharge Instructions (Addendum)
Restart warfarin tonight.

## 2020-12-17 NOTE — CV Procedure (Addendum)
Procedure: TEE  Sedation: Per anesthesiology  Indication: Aortic stenosis, ASD  Findings: Please see echo section for full report.  Moderately dilated LV with normal wall thickness.  EF 30%, diffuse hypokinesis.  Normal right ventricular size and systolic function.  Mildly dilated left atrium and right atrium.  Pacemaker leads in the right heart, there is a mobile 0.38 cm vegetation attached to a pacemaker lead in the right atrium, appears partially calcified.   There was a relatively small secundum ASD present with left to right flow, 0.46 cm.  Trivial PI. Mild TR, peak RV-RA gradient 23 mmHg.  The aortic valve was calcified and tricuspid, mean gradient 9 mmHg with AVA 1.2 cm^2 by VTI and 1.06 cm^2 by planimetry.  Low flow/low gradient moderate aortic stenosis with trivial aortic insufficiency.  There was mild to moderate mitral regurgitation (ERO by PISA 0.08 cm^2).  No reversal of pulmonary vein systolic doppler flow signal. Normal caliber thoracic aorta with grade 3 plaque in the descending thoracic aorta.   Impression:  1.  Low flow/low gradient moderate aortic stenosis.  2.  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  3.  Small partially calcified vegetation on device lead in the left atrium.   Will need to send blood cultures, will need to ask ID about further steps.   Emily [name] 12/17/2020 10:01 AM

## 2020-12-18 ENCOUNTER — Encounter (HOSPITAL_COMMUNITY): Payer: Self-pay | Admitting: Cardiology

## 2020-12-18 LAB — RHEUMATOID FACTOR: Rheumatoid fact SerPl-aCnc: <10 IU/mL (ref <14.0)

## 2020-12-22 ENCOUNTER — Ambulatory Visit (INDEPENDENT_AMBULATORY_CARE_PROVIDER_SITE_OTHER): Payer: PPO | Admitting: *Deleted

## 2020-12-22 DIAGNOSIS — I4891 Unspecified atrial fibrillation: Secondary | ICD-10-CM | POA: Diagnosis not present

## 2020-12-22 DIAGNOSIS — Z5181 Encounter for therapeutic drug level monitoring: Secondary | ICD-10-CM | POA: Diagnosis not present

## 2020-12-22 DIAGNOSIS — I513 Intracardiac thrombosis, not elsewhere classified: Secondary | ICD-10-CM

## 2020-12-22 DIAGNOSIS — I493 Ventricular premature depolarization: Secondary | ICD-10-CM | POA: Diagnosis not present

## 2020-12-22 LAB — CULTURE, BLOOD (ROUTINE X 2)
Culture: NO GROWTH
Culture: NO GROWTH
Special Requests: ADEQUATE

## 2020-12-22 LAB — POCT INR: INR: 1.7 — AB (ref 2.0–3.0)

## 2020-12-22 NOTE — Patient Instructions (Addendum)
Description   -Take 1.5 tablets of warfarin today -Then continue same dose of warfarin  1 tablet daily except 2 tablets on Mondays and Thursdays.  -Call [phone] if started on any new medications.

## 2020-12-23 ENCOUNTER — Encounter: Payer: Self-pay | Admitting: Internal Medicine

## 2020-12-23 ENCOUNTER — Other Ambulatory Visit: Payer: Self-pay

## 2020-12-23 ENCOUNTER — Ambulatory Visit: Payer: PPO | Admitting: Internal Medicine

## 2020-12-23 VITALS — BP 127/81 | HR 86 | Temp 97.6°F | Wt 136.0 lb

## 2020-12-23 DIAGNOSIS — T829XXA Unspecified complication of cardiac and vascular prosthetic device, implant and graft, initial encounter: Secondary | ICD-10-CM

## 2020-12-23 NOTE — Patient Instructions (Signed)
At this time, it doesn't appear you have a pace maker/icd lead infection based on your history   A large percentage of pace maker lead have what we call a thrombus stuck to it.    Since you have potentially future valve surgery planned, we will order a pet ct to definitively give you an answer   Please also have cardiology team repeat 2 more sets of blood culture on your next visit   Avoid any antibiotics unless given by our clinic.    If you have fever, chill, go ahead and get blood cultures within that time frame   See [place] 1 week after pet ct is done (hopefully insurance will approve it). If it is not approved, will continue to clinically monitor you for several more weeks. If your clinical status remains the same (no fever, chill, malaise, new progression in your heart failure) and repeat blood cultures remain negative, will say with more certainty this is not infection

## 2020-12-23 NOTE — Progress Notes (Signed)
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/29/2016   H/O viral myocarditis 12/29/2016   Hypertension, well controlled 12/29/2016   Primary idiopathic dilated cardiomyopathy ([place]) 12/29/2016   Coronary artery disease involving native coronary artery of native heart without angina pectoris 08/28/2016   CKD (chronic kidney disease) stage 4, GFR 15-29 ml/min (HCC) 08/26/2016   Hypothyroidism 04/11/2016   GERD (gastroesophageal reflux disease) 04/11/2016   Gastroenteritis 04/10/2016   Long-term insulin use in type 2 diabetes ([place]) 04/10/2016   Persistent atrial fibrillation ([place]) 04/05/2016   Mural thrombus of cardiac apex 12/18/2015   Warfarin anticoagulation 04/21/2014   ICD (implantable cardioverter-defibrillator) battery depletion 04/21/2014   Acute on chronic systolic congestive heart failure ([place]) 05/02/2012   Dilated cardiomyopathy ([place]) 05/24/2011   HTN (hypertension), benign 05/24/2011   Hypercholesterolemia 05/24/2011   ICD (implantable cardioverter-defibrillator), biventricular, in situ 05/24/2011      HPI: Emily [name] is a 70 y.o. female chf stage c nyhc 3-4, s/p icd 2005 (2010 new wire placed, 2016 battery changed), ckd4, dm2, referred here from cardiology heart failure team for tee pacemaker lead vegetation  No fever, chill, weight loss, persistent joint pain, swelling, nightsweat, rash, decreased appetite, malaise  With regard to her  heart failure she has no increased exercise intolerance, new uncontrolled orthopnea/pnd, edema  No recent febrile illness where she needs to took antibiotics. She had covid about 3 months ago; this was mild and patient was ambulatory.   Patient always have to clear her throat sometimes with forced cough. No allergic rhinitis.   Patient has aortic stenosis and is being evaluated by chf team for valve surgery. On 10/20 she had a tee as part of surgery planning and a small mobile vegetation was found on the lead so referred to ID. She had blood cultures on the same day that had remained negative.  Her blood glucose at home has been stable just above 100 fasting  Patient has gout attack recently and still have pain/swelling in her left 1st mtp joint  Review of Systems: ROS All other ros negative      Past Medical History:  Diagnosis Date   Acute pain of right shoulder 03/22/2019   Acute renal failure ([place]) 04/15/2012   Last Assessment & Plan:  Superimposed on CK D stage III, Baseline Cr ~2-2.5, remains above baseline, felt to be due to Cardiorenal syndrome, creatinine trending down at time of discharge Negative urine eos and no urinary retention ACE inhibitor discontinued Follows with Dr Karen Chafe as outpatient, to have labs drawn 08/15/16 and results sent to [facility] office for review   Anemia 12/29/2016   Biventricular ICD (implantable cardioverter-defibrillator) in place    CHF (congestive heart failure) ([place])    CKD (chronic kidney disease), stage IV ([place])    Colitis 09/11/2012   Complete heart block ([place])    Essential hypertension    Hypomagnesemia 04/10/2016   Hyponatremia 04/10/2016   Last Assessment & Plan:  - improved after tolvaptan 6/29,  chiefly driven by CHF, further improvement with ongoing IV Lasix - continue low Na and fluid restriction - compliance with sodium restriction a major barrier to keeping patient out of hospital and compensated, remains resistant to this philosophy  while inpatient - follows with Dr Karen Chafe as outpatient, Neph consult not indicated at this ti   Localized macular rash 08/11/2016   Last Assessment & Plan:  Seems to trend with heart failure, though she has more findings behind bilateral knees and up into her thigh/groin area than when I last saw her Would use steroid cream for now, consider alternative diagnosis if no improvement   Nonischemic cardiomyopathy ([place])    On amiodarone therapy 08/21/2017   Persistent atrial fibrillation ([place])    AV node ablation 2019   Restless leg syndrome 03/22/2019   Type 2 diabetes mellitus ([place])     Social History   Tobacco Use   Smoking status: Never   Smokeless tobacco: Never  Vaping Use   Vaping Use: Never used  Substance Use Topics   Alcohol use: No   Drug use: No    Family History  Problem Relation Age of Onset   Hip fracture Mother    COPD Father    CAD Sister    Heart attack Sister     Allergies  Allergen Reactions   Amiodarone Rash   Codeine Other (See Comments)    Euphoria, hallucinations   Other Palpitations    Other reaction(s): Other (See Comments) PT STATES SHE GETS REALLY HOT , DIZZY HEADED, HAS PASSED OUT TWICE , AND ITCHING WITH IV DYE Tumeric   Colchicine Rash   Iodine Rash    topical    OBJECTIVE: Vitals:   12/23/20 0856  BP: 127/81  Pulse: 86  Temp: 97.6 F (36.4 C)  TempSrc: Oral  Weight: 136 lb (61.7 kg)   Body mass index is 24.09 kg/m.   Physical Exam General/constitutional: no distress, pleasant HEENT: Normocephalic, PER, Conj Clear, EOMI, Oropharynx clear Neck supple CV: rrr no mrg -- pocket site of icd nontender/nonerythematous/nonfluctuent Lungs: clear to auscultation, normal respiratory effort Abd: Soft, Nontender Ext: no edema Skin: No Rash Neuro: nonfocal MSK: no peripheral joint swelling/tenderness/warmth; back spines nontender   Central line presence: none  Lab: Lab Results  Component Value Date   WBC 7.3 12/17/2020   HGB  11.2 (L) 12/17/2020   HCT 33.0 (L) 12/17/2020   MCV 98.3 12/17/2020   PLT 274 51/70/0174   Last metabolic panel Lab Results  Component Value Date   GLUCOSE 265 (H) 12/17/2020   NA 141 12/17/2020   K 3.3 (L) 12/17/2020   CL 97 (L) 12/17/2020   CO2 28 12/17/2020   BUN 25 (H) 12/17/2020   CREATININE 2.07 (H) 12/17/2020   GFRNONAA 25 (L) 12/17/2020   CALCIUM 7.9 (L) 12/17/2020   PROT 7.0 11/10/2020   ALBUMIN 4.3 11/10/2020   LABGLOB 2.7 11/10/2020   AGRATIO 1.6 11/10/2020   BILITOT 0.7 11/10/2020   ALKPHOS 80 11/10/2020   AST 15 11/10/2020   ALT 8 11/10/2020   ANIONGAP 12 12/17/2020    Microbiology: 10/20 blood culture negative  Serology:  Imaging:   Assessment/plan: Problem List Items Addressed This Visit   None Visit Diagnoses     Pacemaker complications, initial encounter    -  Primary   Relevant Orders   NM PET Image Initial (PI) Skull Base To Thigh (F-18 FDG)         Lead small vegetation that appeared calcified Clinically  no sign of sepsis  Discussed with patient the prevalence of uninfected thrombus on lead can be substantial, and sometimes difficult to tell if infected. At least right now it doesn't appear she is infected. She has no recent antibiotics and blood cx remains negative  She has recent gout flare and still recovering so I am deferring getting crp at this time   We can get a pet ct to assess for uptake of labeled glucose which could suggest infected clot and decide. If she can't get pet ct then surveillance repeat blood cultures and clinical evaluation and time for infection (if present) to declare itself   -repeat 2 set of blood culture next week when follow up with cardiology -no antibiotics for now -pet ct ordered -follow up with me 4 weeks or sooner if fever, chill, new rash, joint/back pain, dyspnea/worsening cough/chf sx -discussed with Dr Aundra Dubin of cardiology       Follow-up: Return in about 4 weeks (around  01/20/2021).  Jabier Mutton, [place] for [place] [phone] pager   [phone] cell 12/23/2020, 9:09 AM

## 2020-12-27 NOTE — Progress Notes (Signed)
PCP: Ezequiel Essex, MD Cardiology: Dr. Domenic Polite HF Cardiology: Dr. Aundra Dubin  70 y.o. with history of CKD stage IV, permanent atrial fibrillation, and a long-standing nonischemic cardiomyopathy was referred by Dr. Domenic Polite for CHF clinic evaluation.  She first developed cardiac problems in 1988.  At that time, she was admitted with CHF and eventually underwent myocardial biopsy at [facility], per her report showing myocarditis.  Patient states that her EF has been low since then.  She had MDT CRT-D device implanted.  Echo in 7/21 showed EF 20-25%, moderate MR, mild AS. She is now in permanent atrial fibrillation and is s/p AV nodal ablation with BiV pacing. Elevated creatinine has limited medication management.    Echo reviewed, EF 25-30%, severe LV dilation, RV normal, moderate TR, mod-severe AS with mean gradient 9 mmHg but AVA 0.76 cm^2 (?low gradient severe AS), mild-moderate MR, left=>right shunt possible ASD.  12/17/20 LHC/RHC   Ost Cx lesion is 30% stenosed.  Ramus lesion is 30% stenosed.  Ost LAD lesion is 30% stenosed.  Prox LAD lesion is 40% stenosed.  1. Mildly elevated PCWP with prominent v-wave in the pressure tracing.  2. Mild pulmonary venous hypertension.  3. Normal RA pressure.  4. Left => right atrial level shunt with Qp/Qs 1.43.  5. Nonobstructive CAD (nonischemic cardiomyopathy).   Zio Patch 12/07/2020- Frequent PVCs with decreased BIV pacing percentage. Recommend suppress with amio but is allergic--rash.    Today she returns for HF follow up with her daughter. Overall feeling fine. SOB with exertion but says this is her baseline.  Denies PND/Orthopnea. Appetite ok. No fever or chills. Weight at home 134-135 pounds. Taking all medications. Having a hard time paying for jardiance.   Labs (1/21): LDL 118 Labs (9/21): K 4.4, creatinine 1.93 Labs (12/21): K 4, creatinine 1.77 Labs (2/22): TSH normal Labs (5/22): K 5, creatinine 2.42 Labs (9/22): K 4.3, creatinine 1.73, LDL  90  Medtronic device interrogation: Bi V pacing ~ 75%. Fluid index low. Impedance up. Activity ~ 4 hours.   PMH: 1. Gout 2. Complete heart block: Medtronic CRT device.  3. CKD stage IV: Diabetic nephropathy.  4. Type 2 diabetes 5. Atrial fibrillation: Now permanent.   - AF ablation 4/18.  - AV nodal ablation in 2019 with MDT CRT-D.  6. Remote LV thrombus 7. Chronic systolic CHF: Nonischemic cardiomyopathy. Medtronic CRT-D device.  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  - LHC 2004: Nonobstructive mild CAD.  - Echo (8/19): EF 10-15%, mildly dilated LV, mild-moderately dilated RV with mild-moderately decreased systolic function.  - Echo (7/21): EF 20-25%, low normal RV function, moderate MR, mild AS.  - Echo (10/22): EF 25-30%, severe LV dilation, RV normal, moderate TR, mod-severe AS with mean gradient 9 mmHg but AVA 0.76 cm^2 (?low gradient severe AS), mild-moderate MR, left=>right shunt possible ASD.  8. H/o cholecystectomy 9. Hypothyroidism 10. Hyperlipidemia 11. Gilbert's disease  SH: Married, lives in [place].  No smoking or ETOH.   Family History  Problem Relation Age of Onset   Hip fracture Mother    COPD Father    CAD Sister    Heart attack Sister    ROS: All systems reviewed and negative except as per HPI.   Current Outpatient Medications  Medication Sig Dispense Refill   acetaminophen (TYLENOL) 500 MG tablet Take 1,000 mg by mouth every 6 (six) hours as needed for mild pain.     allopurinol (ZYLOPRIM) 100 MG tablet Take 100-200 mg  by mouth See admin instructions. Take 200 mg in the morning 100 mg at bedtime     Cinnamon 500 MG capsule Take 500 mg by mouth daily.     empagliflozin (JARDIANCE) 10 MG TABS tablet Take 1 tablet (10 mg total) by mouth daily before breakfast. 30 tablet 11   furosemide (LASIX) 80 MG tablet Take 1 tablet (80 mg total) by mouth every morning. 90 tablet 3   Glucosamine-Chondroitin (OSTEO BI-FLEX REGULAR  STRENGTH PO) Take 1 tablet by mouth daily.     levothyroxine (SYNTHROID) 88 MCG tablet Take 1 tablet (88 mcg total) by mouth daily before breakfast. 30 tablet 11   loratadine (CLARITIN) 10 MG tablet Take 10 mg by mouth daily.     metoprolol succinate (TOPROL-XL) 100 MG 24 hr tablet Take 1 tablet (100 mg total) by mouth daily. . 90 tablet 3   Omega-3 Fatty Acids (FISH OIL) 1200 MG CPDR Take 1,200 mg by mouth daily.     omeprazole (PRILOSEC) 40 MG capsule TAKE 1 CAPSULE BY MOUTH ONCE DAILY. 30 capsule 2   sacubitril-valsartan (ENTRESTO) 24-26 MG Take 1 tablet by mouth 2 (two) times daily. Needs doctor appointment for further refill. 120 tablet 0   simvastatin (ZOCOR) 20 MG tablet TAKE 1 TABLET EVERY DAY AT 6 PM 30 tablet 3   TOUJEO SOLOSTAR 300 UNIT/ML Solostar Pen INJECT 24 UNITS INTO THE SKIN EVERY DAY 4.5 mL 0   warfarin (COUMADIN) 2 MG tablet Take 1 to 2 tablets once daily as directed by anticoagulation clinic (Patient taking differently: Take 2-4 mg by mouth See admin instructions. Take 4 mg on Monday and Thursday all the other day 2 mg after supper  directed by anticoagulation clinic) 40 tablet 1   No current facility-administered medications for this encounter.   BP 90/64   Pulse 81   Wt 60.7 kg (133 lb 12.8 oz)   SpO2 98%   BMI 23.70 kg/m  Wt Readings from Last 3 Encounters:  12/28/20 60.7 kg (133 lb 12.8 oz)  12/23/20 61.7 kg (136 lb)  12/17/20 60.8 kg (134 lb)    General:  Walked in the clinic.  No resp difficulty HEENT: normal Neck: supple. no JVD. Carotids 2+ bilat; no bruits. No lymphadenopathy or thryomegaly appreciated. Cor: PMI nondisplaced. Irregular rate & rhythm. No rubs, gallops . AS 2/6 murmurs. Lungs: clear Abdomen: soft, nontender, nondistended. No hepatosplenomegaly. No bruits or masses. Good bowel sounds. Extremities: no cyanosis, clubbing, rash, edema Neuro: alert & orientedx3, cranial nerves grossly intact. moves all 4 extremities w/o difficulty. Affect  pleasant  EKG: V paced with frequent PVC 88 bpm     Assessment/Plan: 1. Chronic systolic CHF: Nonischemic cardiomyopathy.  Low EF for years.  Per patient's report, she had an endomyocardial biopsy at [facility] in 1988 showing myocarditis.  Medication titration has been limited by CKD stage IV. MDT CRT-D device.   Echo in 7/21 showed EF 20-25%, moderate MR, mild AS.  Echo 12/17/20 showed EF 25-30%, severe LV dilation, RV normal, moderate TR, mod-severe AS with mean gradient 9 mmHg but AVA 0.76 cm^2  - TEE with small ASD and moderate aortic stenosis.  -NYHA III symptoms, worsening.  She is BiV pacing < 80% of the time.  - Volume status stable. Continue to 80 mg daily. - Continue Jardiance 10 mg daily. She is having a hard time paying for jardiance and was offered assistance but she does not want to provide financial information. Continue for now.  - Continue Delene Loll  24/26 bid.  - Continue  Toprol XL to 100 mg daily.  - Check BMET  2.  Atrial fibrillation: Permanent.  S/p AV nodal ablation with BiV pacing.   - Continue warfarin.   4. PVCs: Frequent.  Decreasing BiV pacing percentage.   Zio with 12.6% PVCs .  - Continue Toprol XL.  - Allergic to amio so started mexilitene 150 mg three times a day.   5. Aortic stenosis: Moderate-severe AS on echo, - 10/20 /22 TEE low flow/low gradient moderate aortic stenosis.  Milas Kocher discussed with Dr Burt Knack. No indication for TAVR at this time.  6. 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   - Small ASD on TEE . Dr Aundra Dubin discussed with Dr Burt Knack. No indication for repair.  7. Gout: Stable.  8. Vegetation on lead.  -Blood Cx negative.  -Saw ID with recommendations for Pet CT to assess if unable to get Pet will need repeat blood culutres.  - No antibiotics for now.  - She has follow up with ID.   Follow up 3 weeks and will need EKG. Possibly Zio to quantify PVC. Hopefully with addition of mexilitene will suppress PVC burden.    Jaggar Benko  NP-C 12/28/2020

## 2020-12-28 ENCOUNTER — Other Ambulatory Visit (HOSPITAL_COMMUNITY): Payer: Self-pay

## 2020-12-28 ENCOUNTER — Encounter (HOSPITAL_COMMUNITY): Payer: Self-pay

## 2020-12-28 ENCOUNTER — Other Ambulatory Visit: Payer: Self-pay

## 2020-12-28 ENCOUNTER — Ambulatory Visit (INDEPENDENT_AMBULATORY_CARE_PROVIDER_SITE_OTHER): Payer: PPO

## 2020-12-28 ENCOUNTER — Ambulatory Visit (HOSPITAL_COMMUNITY)
Admission: RE | Admit: 2020-12-28 | Discharge: 2020-12-28 | Disposition: A | Discharge status: Home / Self Care | Payer: PPO | Source: Ambulatory Visit | Attending: Adult Health | Admitting: Adult Health

## 2020-12-28 VITALS — BP 90/64 | HR 81 | Wt 133.8 lb

## 2020-12-28 DIAGNOSIS — Q211 Atrial septal defect, unspecified: Secondary | ICD-10-CM | POA: Insufficient documentation

## 2020-12-28 DIAGNOSIS — I5022 Chronic systolic (congestive) heart failure: Secondary | ICD-10-CM | POA: Diagnosis not present

## 2020-12-28 DIAGNOSIS — E1122 Type 2 diabetes mellitus with diabetic chronic kidney disease: Secondary | ICD-10-CM | POA: Insufficient documentation

## 2020-12-28 DIAGNOSIS — Z79899 Other long term (current) drug therapy: Secondary | ICD-10-CM | POA: Insufficient documentation

## 2020-12-28 DIAGNOSIS — Z794 Long term (current) use of insulin: Secondary | ICD-10-CM | POA: Diagnosis not present

## 2020-12-28 DIAGNOSIS — Z95810 Presence of automatic (implantable) cardiac defibrillator: Secondary | ICD-10-CM

## 2020-12-28 DIAGNOSIS — I4819 Other persistent atrial fibrillation: Secondary | ICD-10-CM | POA: Diagnosis not present

## 2020-12-28 DIAGNOSIS — I493 Ventricular premature depolarization: Secondary | ICD-10-CM | POA: Diagnosis not present

## 2020-12-28 DIAGNOSIS — Z8249 Family history of ischemic heart disease and other diseases of the circulatory system: Secondary | ICD-10-CM | POA: Insufficient documentation

## 2020-12-28 DIAGNOSIS — I350 Nonrheumatic aortic (valve) stenosis: Secondary | ICD-10-CM | POA: Insufficient documentation

## 2020-12-28 DIAGNOSIS — R0602 Shortness of breath: Secondary | ICD-10-CM | POA: Insufficient documentation

## 2020-12-28 DIAGNOSIS — I4821 Permanent atrial fibrillation: Secondary | ICD-10-CM | POA: Diagnosis not present

## 2020-12-28 DIAGNOSIS — N184 Chronic kidney disease, stage 4 (severe): Secondary | ICD-10-CM | POA: Diagnosis not present

## 2020-12-28 DIAGNOSIS — I428 Other cardiomyopathies: Secondary | ICD-10-CM | POA: Diagnosis not present

## 2020-12-28 DIAGNOSIS — Z7984 Long term (current) use of oral hypoglycemic drugs: Secondary | ICD-10-CM | POA: Insufficient documentation

## 2020-12-28 DIAGNOSIS — Z7901 Long term (current) use of anticoagulants: Secondary | ICD-10-CM | POA: Insufficient documentation

## 2020-12-28 DIAGNOSIS — M109 Gout, unspecified: Secondary | ICD-10-CM | POA: Diagnosis not present

## 2020-12-28 LAB — BASIC METABOLIC PANEL
Anion gap: 13 (ref 5–15)
BUN: 25 mg/dL — ABNORMAL HIGH (ref 8–23)
CO2: 26 mmol/L (ref 22–32)
Calcium: 8.5 mg/dL — ABNORMAL LOW (ref 8.9–10.3)
Chloride: 97 mmol/L — ABNORMAL LOW (ref 98–111)
Creatinine, Ser: 2.18 mg/dL — ABNORMAL HIGH (ref 0.44–1.00)
GFR, Estimated: 23 mL/min — ABNORMAL LOW (ref >60)
Glucose, Bld: 212 mg/dL — ABNORMAL HIGH (ref 70–99)
Potassium: 4.2 mmol/L (ref 3.5–5.1)
Sodium: 136 mmol/L (ref 135–145)

## 2020-12-28 MED ORDER — MEXILETINE HCL 150 MG PO CAPS: 150.0000 mg | ORAL_CAPSULE | Freq: Three times a day (TID) | ORAL | 4 refills | Status: DC

## 2020-12-28 NOTE — Patient Instructions (Addendum)
EKG was done today  Labs were done today, if any labs are abnormal the clinic will call you  Start Mexiletine 150 mg 1 tablet 3 times daily  Entresto prescription you were approved for Novartis assistance through 02/27/2021,  you would need to call [phone] to get a refill  Your physician recommends that you schedule a follow-up appointment in: 3 weeks and then in 6 weeks  At the [facility], you and your health needs are our priority. As part of our continuing mission to provide you with exceptional heart care, we have created designated Provider Care Teams. These Care Teams include your primary Cardiologist (physician) and Advanced Practice Providers (APPs- Physician Assistants and Nurse Practitioners) who all work together to provide you with the care you need, when you need it.   You may see any of the following providers on your designated Care Team at your next follow up: Dr Glori Bickers Dr Haynes Kerns, NP Lyda Jester, [place] [facility] [place], [place] Audry Riles, PharmD   Please be sure to bring in all your medications bottles to every appointment.   If you have any questions or concerns before your next appointment please send [place] a message through [place] or call our office at [phone].    TO LEAVE A MESSAGE FOR THE NURSE SELECT OPTION 2, PLEASE LEAVE A MESSAGE INCLUDING: YOUR NAME DATE OF BIRTH CALL BACK NUMBER REASON FOR CALL**this is important as we prioritize the call backs  YOU WILL RECEIVE A CALL BACK THE SAME DAY AS LONG AS YOU CALL BEFORE 4:00 PM

## 2020-12-29 NOTE — Progress Notes (Signed)
EPIC Encounter for ICM Monitoring  Patient Name: Emily [name] is a 70 y.o. female Date: 12/29/2020 Primary Care Physican: Ezequiel Essex, MD Primary Cardiologist: [place] Electrophysiologist: Santina Evans Pacing: 87.1%  12/28/2020 Office Weight: 133 lbs   Time in AT/AF 24.0 hr/day (100.0%) Longest AT/AF            39 months   Battery ERI:  4 months         Spoke with patient and heart failure questions reviewed.  Pt asymptomatic for fluid accumulation and feeling well.            Optivol Thoracic impedance suggesting fluid levels returned to normal.   Prescribed: Furosemide 80 mg Take 1 tablet (80 mg total) by mouth every morning.   Labs: 12/07/2020 Creatinine 1.97, BUN 23, Potassium 3.8, Sodium 139 11/10/2020 Creatinine 1.73, BUN 24, Potassium 4.3, Sodium 140, GFR 31 07/17/2020 Creatinine 2.67, BUN 27, Potassium 4.9, Sodium 138 07/03/2020 Creatinine 2.42, BUN 32, Potassium 5.0, Sodium 137, GFR 21 06/26/2020 Creatinine 1.99, BUN 23, Potassium 5.5, Sodium 140, GFR 26  06/09/2020 Creatinine 2.41, BUN 31, Potassium 4.8, Sodium 139 05/26/2020 Creatinine 2.32, BUN 40, Potassium 4.8, Sodium 137, GFR 22  A complete set of results can be found in Results Review.   Recommendations:  No changes and encouraged to call if experiencing any fluid symptoms.   Follow-up plan: ICM clinic phone appointment on 02/01/2021.  91 day device clinic remote transmission 02/08/2021.      EP/Cardiology Office Visits:  01/25/2021 with Dr Aundra Dubin.       Copy of ICM check sent to Dr. Lovena Le.       3 month ICM trend: 12/28/2020.    1 Year ICM trend:       [name], RN 12/29/2020 5:17 PM

## 2020-12-31 ENCOUNTER — Other Ambulatory Visit: Payer: Self-pay

## 2020-12-31 ENCOUNTER — Encounter (HOSPITAL_COMMUNITY)
Admission: RE | Admit: 2020-12-31 | Discharge: 2020-12-31 | Disposition: A | Discharge status: Home / Self Care | Payer: PPO | Source: Ambulatory Visit | Attending: Internal Medicine | Admitting: Internal Medicine

## 2020-12-31 DIAGNOSIS — T827XXA Infection and inflammatory reaction due to other cardiac and vascular devices, implants and grafts, initial encounter: Secondary | ICD-10-CM | POA: Diagnosis not present

## 2020-12-31 DIAGNOSIS — T829XXA Unspecified complication of cardiac and vascular prosthetic device, implant and graft, initial encounter: Secondary | ICD-10-CM | POA: Diagnosis not present

## 2020-12-31 DIAGNOSIS — I700 Atherosclerosis of aorta: Secondary | ICD-10-CM | POA: Diagnosis not present

## 2020-12-31 DIAGNOSIS — Z950 Presence of cardiac pacemaker: Secondary | ICD-10-CM | POA: Diagnosis not present

## 2020-12-31 MED ORDER — FLUDEOXYGLUCOSE F - 18 (FDG) INJECTION
7.0570 | Freq: Once | INTRAVENOUS | Status: AC | PRN
  Administered 2020-12-31: 7.057 via INTRAVENOUS

## 2021-01-01 ENCOUNTER — Telehealth: Payer: Self-pay

## 2021-01-01 NOTE — Telephone Encounter (Signed)
-----   Message from Jabier Mutton, MD sent at 01/01/2021 12:39 PM EDT ----- Dr Aundra Dubin  This patient's pet ct is not suggestive of lead infection. Coupled with clinical history and negative blood culture, i would call this a lead thrombus   We can move forward just observing her off abx   Team [facility], could you let patient know her Pet ct doesnt suggest infection. We can see her in around another 4 weeks just to see how she does and close the loop.   Thank you [place]

## 2021-01-01 NOTE — Telephone Encounter (Signed)
Patient notified of scan results. Reminded her of follow up with Dr. Gale Journey on 11/21.   Emily Sibert Lorita Officer, RN

## 2021-01-04 ENCOUNTER — Telehealth (HOSPITAL_COMMUNITY): Payer: Self-pay | Admitting: *Deleted

## 2021-01-04 NOTE — Telephone Encounter (Signed)
Pt called stating she cant take mexiletine she hasnt slept in four days. Pt said medication made her weak and nervous and she has "roaring" in her ears/head.    Routed to [place]

## 2021-01-04 NOTE — Telephone Encounter (Signed)
Try dropping mexiletine to bid instead of tid.  If this does not help, may have to stop.

## 2021-01-05 NOTE — Telephone Encounter (Signed)
Pt said she stopped medication already and feels better she will not restart. Pt has f/u 11/28.

## 2021-01-07 ENCOUNTER — Ambulatory Visit (INDEPENDENT_AMBULATORY_CARE_PROVIDER_SITE_OTHER): Payer: PPO | Admitting: *Deleted

## 2021-01-07 ENCOUNTER — Other Ambulatory Visit: Payer: Self-pay

## 2021-01-07 DIAGNOSIS — Z5181 Encounter for therapeutic drug level monitoring: Secondary | ICD-10-CM | POA: Diagnosis not present

## 2021-01-07 DIAGNOSIS — I513 Intracardiac thrombosis, not elsewhere classified: Secondary | ICD-10-CM

## 2021-01-07 DIAGNOSIS — I4891 Unspecified atrial fibrillation: Secondary | ICD-10-CM | POA: Diagnosis not present

## 2021-01-07 DIAGNOSIS — I420 Dilated cardiomyopathy: Secondary | ICD-10-CM | POA: Diagnosis not present

## 2021-01-07 LAB — POCT INR: INR: 2.1 (ref 2.0–3.0)

## 2021-01-07 NOTE — Patient Instructions (Signed)
Continue warfarin 1 tablet daily except 2 tablets on Mondays and Thursdays.  Recheck in 4 weeks -Call [phone] if started on any new medications.

## 2021-01-11 ENCOUNTER — Ambulatory Visit (INDEPENDENT_AMBULATORY_CARE_PROVIDER_SITE_OTHER): Payer: PPO

## 2021-01-11 DIAGNOSIS — I420 Dilated cardiomyopathy: Secondary | ICD-10-CM

## 2021-01-12 LAB — CUP PACEART REMOTE DEVICE CHECK
Battery Remaining Longevity: 4 mo
Battery Voltage: 2.77 V
Brady Statistic RA Percent Paced: 0.04 %
Brady Statistic RV Percent Paced: 85.43 %
Date Time Interrogation Session: 20221114043824
HighPow Impedance: 65 Ohm
Implantable Lead Implant Date: 20050218
Implantable Lead Implant Date: 20050218
Implantable Lead Implant Date: 20050218
Implantable Lead Location: 753858
Implantable Lead Location: 753859
Implantable Lead Location: 753860
Implantable Lead Model: 4194
Implantable Lead Model: 5076
Implantable Lead Model: 6947
Implantable Pulse Generator Implant Date: 20160413
Lead Channel Impedance Value: 342 Ohm
Lead Channel Impedance Value: 399 Ohm
Lead Channel Impedance Value: 399 Ohm
Lead Channel Impedance Value: 551 Ohm
Lead Channel Impedance Value: 646 Ohm
Lead Channel Impedance Value: 893 Ohm
Lead Channel Pacing Threshold Amplitude: 0.75 V
Lead Channel Pacing Threshold Amplitude: 0.875 V
Lead Channel Pacing Threshold Amplitude: 1.125 V
Lead Channel Pacing Threshold Pulse Width: 0.4 ms
Lead Channel Pacing Threshold Pulse Width: 0.4 ms
Lead Channel Pacing Threshold Pulse Width: 0.4 ms
Lead Channel Sensing Intrinsic Amplitude: 0.625 mV
Lead Channel Sensing Intrinsic Amplitude: 0.625 mV
Lead Channel Sensing Intrinsic Amplitude: 3.875 mV
Lead Channel Sensing Intrinsic Amplitude: 3.875 mV
Lead Channel Setting Pacing Amplitude: 2 V
Lead Channel Setting Pacing Amplitude: 2.25 V
Lead Channel Setting Pacing Amplitude: 2.5 V
Lead Channel Setting Pacing Pulse Width: 0.4 ms
Lead Channel Setting Pacing Pulse Width: 0.4 ms
Lead Channel Setting Sensing Sensitivity: 0.3 mV

## 2021-01-15 ENCOUNTER — Other Ambulatory Visit: Payer: Self-pay | Admitting: Cardiology

## 2021-01-15 DIAGNOSIS — Z5181 Encounter for therapeutic drug level monitoring: Secondary | ICD-10-CM

## 2021-01-15 DIAGNOSIS — I4821 Permanent atrial fibrillation: Secondary | ICD-10-CM

## 2021-01-15 NOTE — Telephone Encounter (Signed)
Prescription refill request received for warfarin Lov: Lovena Le, 11/17/2020 Next INR check: 12/8  Warfarin tablet strength: 2mg 

## 2021-01-18 ENCOUNTER — Ambulatory Visit: Payer: PPO | Admitting: Internal Medicine

## 2021-01-19 NOTE — Addendum Note (Signed)
Addended by: Cheri Kearns A on: 01/19/2021 03:25 PM   Modules accepted: Level of Service

## 2021-01-19 NOTE — Progress Notes (Signed)
Remote ICD transmission.   

## 2021-01-25 ENCOUNTER — Encounter (HOSPITAL_COMMUNITY): Payer: PPO

## 2021-01-28 ENCOUNTER — Other Ambulatory Visit: Payer: Self-pay | Admitting: Family Medicine

## 2021-02-01 ENCOUNTER — Ambulatory Visit (INDEPENDENT_AMBULATORY_CARE_PROVIDER_SITE_OTHER): Payer: PPO

## 2021-02-01 DIAGNOSIS — I5022 Chronic systolic (congestive) heart failure: Secondary | ICD-10-CM

## 2021-02-01 DIAGNOSIS — Z95810 Presence of automatic (implantable) cardiac defibrillator: Secondary | ICD-10-CM

## 2021-02-02 NOTE — Progress Notes (Signed)
EPIC Encounter for ICM Monitoring  Patient Name: Emily [name] is a 70 y.o. female Date: 02/02/2021 Primary Care Physican: Ezequiel Essex, MD Primary Cardiologist: [place]/[facility] Electrophysiologist: Santina Evans Pacing: 85.7%  12/28/2020 Office Weight: 133 lbs   Time in AT/AF 24.0 hr/day (100.0%) Longest AT/AF            40 months   Battery ERI:  3 months         Spoke with patient and heart failure questions reviewed.  Pt reports minimal swelling of feet.   She has been taking Prednisone for the past week which may contribute to decreased impedance.          Optivol Thoracic impedance suggesting possible fluid accumulation starting 01/27/2021 but trending back toward baseline.   Prescribed: Furosemide 80 mg Take 1 tablet (80 mg total) by mouth every morning.   Labs: 12/28/2020 Creatinine 2.18, BUN 25, Potassium 4.2, Sodium 136, GFR 23 12/07/2020 Creatinine 2.07, BUN 25, Potassium 3.2, Sodium 137, GFR 25 12/07/2020 Creatinine 1.97, BUN 23, Potassium 3.8, Sodium 139 11/10/2020 Creatinine 1.73, BUN 24, Potassium 4.3, Sodium 140, GFR 31 07/17/2020 Creatinine 2.67, BUN 27, Potassium 4.9, Sodium 138 07/03/2020 Creatinine 2.42, BUN 32, Potassium 5.0, Sodium 137, GFR 21 06/26/2020 Creatinine 1.99, BUN 23, Potassium 5.5, Sodium 140, GFR 26  06/09/2020 Creatinine 2.41, BUN 31, Potassium 4.8, Sodium 139 05/26/2020 Creatinine 2.32, BUN 40, Potassium 4.8, Sodium 137, GFR 22  A complete set of results can be found in Results Review.   Recommendations:  She self adjusted Furosemide and took extra tablet yesterday and will take extra 0.5 tablet this afternoon.  Prednisone prescription has been completed.   Follow-up plan: ICM clinic phone appointment on 03/08/2021.  91 day device clinic remote transmission 02/08/2021.      EP/Cardiology Office Visits:  03/08/2021 with Dr Aundra Dubin.       Copy of ICM check sent to Dr. Lovena Le.       3 month ICM trend: 02/01/2021.    12-14 Month ICM trend:        [name], RN 02/02/2021 3:23 PM

## 2021-02-04 ENCOUNTER — Ambulatory Visit (INDEPENDENT_AMBULATORY_CARE_PROVIDER_SITE_OTHER): Payer: PPO | Admitting: *Deleted

## 2021-02-04 ENCOUNTER — Other Ambulatory Visit: Payer: Self-pay

## 2021-02-04 DIAGNOSIS — I4891 Unspecified atrial fibrillation: Secondary | ICD-10-CM

## 2021-02-04 DIAGNOSIS — Z5181 Encounter for therapeutic drug level monitoring: Secondary | ICD-10-CM

## 2021-02-04 DIAGNOSIS — I513 Intracardiac thrombosis, not elsewhere classified: Secondary | ICD-10-CM

## 2021-02-04 LAB — POCT INR: INR: 2.5 (ref 2.0–3.0)

## 2021-02-04 NOTE — Patient Instructions (Signed)
Continue warfarin 1 tablet daily except 2 tablets on Mondays and Thursdays.  Recheck in 4 weeks -Call [phone] if started on any new medications.

## 2021-02-08 ENCOUNTER — Ambulatory Visit (INDEPENDENT_AMBULATORY_CARE_PROVIDER_SITE_OTHER): Payer: PPO

## 2021-02-08 DIAGNOSIS — I420 Dilated cardiomyopathy: Secondary | ICD-10-CM | POA: Diagnosis not present

## 2021-02-09 LAB — CUP PACEART REMOTE DEVICE CHECK
Battery Remaining Longevity: 3 mo
Battery Voltage: 2.78 V
Brady Statistic RA Percent Paced: 0.01 %
Brady Statistic RV Percent Paced: 90 %
Date Time Interrogation Session: 20221211043824
HighPow Impedance: 67 Ohm
Implantable Lead Implant Date: 20050218
Implantable Lead Implant Date: 20050218
Implantable Lead Implant Date: 20050218
Implantable Lead Location: 753858
Implantable Lead Location: 753859
Implantable Lead Location: 753860
Implantable Lead Model: 4194
Implantable Lead Model: 5076
Implantable Lead Model: 6947
Implantable Pulse Generator Implant Date: 20160413
Lead Channel Impedance Value: 399 Ohm
Lead Channel Impedance Value: 399 Ohm
Lead Channel Impedance Value: 418 Ohm
Lead Channel Impedance Value: 608 Ohm
Lead Channel Impedance Value: 703 Ohm
Lead Channel Impedance Value: 931 Ohm
Lead Channel Pacing Threshold Amplitude: 0.75 V
Lead Channel Pacing Threshold Amplitude: 0.875 V
Lead Channel Pacing Threshold Amplitude: 1.25 V
Lead Channel Pacing Threshold Pulse Width: 0.4 ms
Lead Channel Pacing Threshold Pulse Width: 0.4 ms
Lead Channel Pacing Threshold Pulse Width: 0.4 ms
Lead Channel Sensing Intrinsic Amplitude: 0.5 mV
Lead Channel Sensing Intrinsic Amplitude: 0.5 mV
Lead Channel Sensing Intrinsic Amplitude: 3.375 mV
Lead Channel Sensing Intrinsic Amplitude: 3.375 mV
Lead Channel Setting Pacing Amplitude: 2 V
Lead Channel Setting Pacing Amplitude: 2.25 V
Lead Channel Setting Pacing Amplitude: 2.5 V
Lead Channel Setting Pacing Pulse Width: 0.4 ms
Lead Channel Setting Pacing Pulse Width: 0.4 ms
Lead Channel Setting Sensing Sensitivity: 0.3 mV

## 2021-02-15 ENCOUNTER — Other Ambulatory Visit: Payer: Self-pay | Admitting: Family Medicine

## 2021-02-18 NOTE — Progress Notes (Signed)
Remote ICD transmission.   

## 2021-02-19 ENCOUNTER — Telehealth (HOSPITAL_COMMUNITY): Payer: Self-pay | Admitting: Pharmacy Technician

## 2021-02-19 NOTE — Telephone Encounter (Signed)
Advanced Heart Failure Patient Advocate Encounter  Received communication from [employer] they are missing the prescriber's portion of the patient's application along with POI. Called and spoke with the patient. She submitted the application herself. When I reminded her about the POI, she stated that her husband would not allow her to disclose financial documents to anyone. She is aware that she is denied for assistance for Novartis and BI Cares due to this.   Spoke with patient at length about importance of staying on [place]. We cannot supply her with samples for these medications if they are not willing to provide financial documentation. She is understanding of all this and has a month and a half of Entresto left at this point. Would need to change her medications after she runs out. She is aware to call when that occurs. Will route note to provider as well.  Charlann Boxer, CPhT

## 2021-03-02 ENCOUNTER — Other Ambulatory Visit: Payer: Self-pay | Admitting: Cardiology

## 2021-03-02 DIAGNOSIS — I4821 Permanent atrial fibrillation: Secondary | ICD-10-CM

## 2021-03-02 DIAGNOSIS — Z5181 Encounter for therapeutic drug level monitoring: Secondary | ICD-10-CM

## 2021-03-02 NOTE — Telephone Encounter (Signed)
Warfarin refill: LOV 12/07/20  Einar Crow MD Last INR 2.5 on 02/04/21  Refill approved.

## 2021-03-04 ENCOUNTER — Ambulatory Visit (INDEPENDENT_AMBULATORY_CARE_PROVIDER_SITE_OTHER): Payer: PPO | Admitting: *Deleted

## 2021-03-04 DIAGNOSIS — Z5181 Encounter for therapeutic drug level monitoring: Secondary | ICD-10-CM

## 2021-03-04 DIAGNOSIS — I513 Intracardiac thrombosis, not elsewhere classified: Secondary | ICD-10-CM | POA: Diagnosis not present

## 2021-03-04 DIAGNOSIS — I4891 Unspecified atrial fibrillation: Secondary | ICD-10-CM

## 2021-03-04 LAB — POCT INR: INR: 2.6 (ref 2.0–3.0)

## 2021-03-04 NOTE — Patient Instructions (Signed)
Continue warfarin 1 tablet daily except 2 tablets on Mondays and Thursdays.  Recheck in 6 weeks -Call [phone] if started on any new medications.

## 2021-03-08 ENCOUNTER — Ambulatory Visit (INDEPENDENT_AMBULATORY_CARE_PROVIDER_SITE_OTHER): Payer: PPO

## 2021-03-08 ENCOUNTER — Encounter (HOSPITAL_COMMUNITY): Payer: PPO | Admitting: Cardiology

## 2021-03-08 DIAGNOSIS — Z95810 Presence of automatic (implantable) cardiac defibrillator: Secondary | ICD-10-CM | POA: Diagnosis not present

## 2021-03-08 DIAGNOSIS — I5022 Chronic systolic (congestive) heart failure: Secondary | ICD-10-CM | POA: Diagnosis not present

## 2021-03-08 NOTE — Progress Notes (Signed)
EPIC Encounter for ICM Monitoring  Patient Name: Emily [name] is a 71 y.o. female Date: 03/08/2021 Primary Care Physican: Ezequiel Essex, MD Primary Cardiologist: [facility] Electrophysiologist: Santina Evans Pacing: 86.6%  03/05/2021 Weight: 133 -134 lbs   Time in AT/AF 24.0 hr/day (100.0%) Longest AT/AF            41 months   Battery ERI:  3 months         Spoke with patient and heart failure questions reviewed.  Pt is asymptomatic for fluid symptoms.         Optivol Thoracic impedance suggesting possible fluid accumulation starting 03/01/2021.   Prescribed: Furosemide 80 mg Take 1 tablet (80 mg total) by mouth every morning.   Labs: 12/28/2020 Creatinine 2.18, BUN 25, Potassium 4.2, Sodium 136, GFR 23 12/07/2020 Creatinine 2.07, BUN 25, Potassium 3.2, Sodium 137, GFR 25 12/07/2020 Creatinine 1.97, BUN 23, Potassium 3.8, Sodium 139 11/10/2020 Creatinine 1.73, BUN 24, Potassium 4.3, Sodium 140, GFR 31 07/17/2020 Creatinine 2.67, BUN 27, Potassium 4.9, Sodium 138 07/03/2020 Creatinine 2.42, BUN 32, Potassium 5.0, Sodium 137, GFR 21 06/26/2020 Creatinine 1.99, BUN 23, Potassium 5.5, Sodium 140, GFR 26  06/09/2020 Creatinine 2.41, BUN 31, Potassium 4.8, Sodium 139 05/26/2020 Creatinine 2.32, BUN 40, Potassium 4.8, Sodium 137, GFR 22  A complete set of results can be found in Results Review.   Recommendations:  She will self adjust Furosemide and take extra 0.5 tablet (40 mg total)  tomorrow.      Follow-up plan: ICM clinic phone appointment on 03/15/2021 to recheck fluid levels.  91 day device clinic remote transmission 05/09/2021.      EP/Cardiology Office Visits:  03/29/2021 with Dr Aundra Dubin.       Copy of ICM check sent to Dr. Lovena Le.    3 month ICM trend: 03/08/2021.    12-14 Month ICM trend:     [name], RN 03/08/2021 5:04 PM

## 2021-03-12 ENCOUNTER — Ambulatory Visit: Payer: PPO | Admitting: Podiatry

## 2021-03-12 ENCOUNTER — Encounter: Payer: Self-pay | Admitting: Podiatry

## 2021-03-12 ENCOUNTER — Ambulatory Visit (INDEPENDENT_AMBULATORY_CARE_PROVIDER_SITE_OTHER): Payer: PPO

## 2021-03-12 ENCOUNTER — Telehealth: Payer: Self-pay | Admitting: *Deleted

## 2021-03-12 ENCOUNTER — Other Ambulatory Visit: Payer: Self-pay

## 2021-03-12 DIAGNOSIS — M7731 Calcaneal spur, right foot: Secondary | ICD-10-CM | POA: Diagnosis not present

## 2021-03-12 DIAGNOSIS — M722 Plantar fascial fibromatosis: Secondary | ICD-10-CM | POA: Diagnosis not present

## 2021-03-12 DIAGNOSIS — M79671 Pain in right foot: Secondary | ICD-10-CM

## 2021-03-12 NOTE — Telephone Encounter (Signed)
Patient is requesting to be seen today, problems with foot.Please contact to schedule.

## 2021-03-15 ENCOUNTER — Ambulatory Visit (INDEPENDENT_AMBULATORY_CARE_PROVIDER_SITE_OTHER): Payer: PPO

## 2021-03-15 DIAGNOSIS — I5022 Chronic systolic (congestive) heart failure: Secondary | ICD-10-CM

## 2021-03-15 LAB — CUP PACEART REMOTE DEVICE CHECK
Battery Remaining Longevity: 3 mo
Battery Voltage: 2.76 V
Brady Statistic RA Percent Paced: 0.01 %
Brady Statistic RV Percent Paced: 84.75 %
Date Time Interrogation Session: 20230116033524
HighPow Impedance: 70 Ohm
Implantable Lead Implant Date: 20050218
Implantable Lead Implant Date: 20050218
Implantable Lead Implant Date: 20050218
Implantable Lead Location: 753858
Implantable Lead Location: 753859
Implantable Lead Location: 753860
Implantable Lead Model: 4194
Implantable Lead Model: 5076
Implantable Lead Model: 6947
Implantable Pulse Generator Implant Date: 20160413
Lead Channel Impedance Value: 399 Ohm
Lead Channel Impedance Value: 399 Ohm
Lead Channel Impedance Value: 418 Ohm
Lead Channel Impedance Value: 608 Ohm
Lead Channel Impedance Value: 703 Ohm
Lead Channel Impedance Value: 931 Ohm
Lead Channel Pacing Threshold Amplitude: 0.75 V
Lead Channel Pacing Threshold Amplitude: 0.875 V
Lead Channel Pacing Threshold Amplitude: 1.125 V
Lead Channel Pacing Threshold Pulse Width: 0.4 ms
Lead Channel Pacing Threshold Pulse Width: 0.4 ms
Lead Channel Pacing Threshold Pulse Width: 0.4 ms
Lead Channel Sensing Intrinsic Amplitude: 0.5 mV
Lead Channel Sensing Intrinsic Amplitude: 0.5 mV
Lead Channel Sensing Intrinsic Amplitude: 3 mV
Lead Channel Sensing Intrinsic Amplitude: 3 mV
Lead Channel Setting Pacing Amplitude: 2 V
Lead Channel Setting Pacing Amplitude: 2.25 V
Lead Channel Setting Pacing Amplitude: 2.5 V
Lead Channel Setting Pacing Pulse Width: 0.4 ms
Lead Channel Setting Pacing Pulse Width: 0.4 ms
Lead Channel Setting Sensing Sensitivity: 0.3 mV

## 2021-03-16 ENCOUNTER — Ambulatory Visit (INDEPENDENT_AMBULATORY_CARE_PROVIDER_SITE_OTHER): Payer: PPO

## 2021-03-16 DIAGNOSIS — Z95810 Presence of automatic (implantable) cardiac defibrillator: Secondary | ICD-10-CM

## 2021-03-16 DIAGNOSIS — I5022 Chronic systolic (congestive) heart failure: Secondary | ICD-10-CM

## 2021-03-16 NOTE — Progress Notes (Signed)
Subjective:  Patient ID: Emily [name], female    DOB: 10/12/1950,  MRN: [id number]  Chief Complaint  Patient presents with   Foot Pain    Right foot pain for about 9 days  Unable to put weight down on the heel     71 y.o. female presents with the above complaint.  Patient presents with new complaint of right heel pain that has been there for about 9 days has progressed gotten worse.  She is a diabetic with last A1c of 8.8.  She is able to put any weight down on the foot.  Swollen causing her to start with every step.  She has not seen anyone else prior to seeing me.  She would like to discuss treatment options for this.  She has not had any history of Planter fasciitis that she is aware of.   Review of Systems: Negative except as noted in the HPI. Denies N/V/F/Ch.  Past Medical History:  Diagnosis Date   Acute pain of right shoulder 03/22/2019   Acute renal failure ([place]) 04/15/2012   Last Assessment & Plan:  Superimposed on CK D stage III, Baseline Cr ~2-2.5, remains above baseline, felt to be due to Cardiorenal syndrome, creatinine trending down at time of discharge Negative urine eos and no urinary retention ACE inhibitor discontinued Follows with Dr Karen Chafe as outpatient, to have labs drawn 08/15/16 and results sent to [facility] office for review   Anemia 12/29/2016   Biventricular ICD (implantable cardioverter-defibrillator) in place    CHF (congestive heart failure) ([place])    CKD (chronic kidney disease), stage IV ([place])    Colitis 09/11/2012   Complete heart block ([place])    Essential hypertension    Hypomagnesemia 04/10/2016   Hyponatremia 04/10/2016   Last Assessment & Plan:  - improved after tolvaptan 6/29, chiefly driven by CHF, further improvement with ongoing IV Lasix - continue low Na and fluid restriction - compliance with sodium restriction a major barrier to keeping patient out of hospital and compensated, remains resistant to this philosophy while inpatient - follows with  Dr Karen Chafe as outpatient, Neph consult not indicated at this ti   Localized macular rash 08/11/2016   Last Assessment & Plan:  Seems to trend with heart failure, though she has more findings behind bilateral knees and up into her thigh/groin area than when I last saw her Would use steroid cream for now, consider alternative diagnosis if no improvement   Nonischemic cardiomyopathy ([place])    On amiodarone therapy 08/21/2017   Persistent atrial fibrillation ([place])    AV node ablation 2019   Restless leg syndrome 03/22/2019   Type 2 diabetes mellitus ([place])     Current Outpatient Medications:    omeprazole (PRILOSEC) 40 MG capsule, TAKE 1 CAPSULE BY MOUTH ONCE DAILY., Disp: 30 capsule, Rfl: 0   acetaminophen (TYLENOL) 500 MG tablet, Take 1,000 mg by mouth every 6 (six) hours as needed for mild pain., Disp: , Rfl:    allopurinol (ZYLOPRIM) 100 MG tablet, Take 100-200 mg by mouth See admin instructions. Take 200 mg in the morning 100 mg at bedtime, Disp: , Rfl:    Cinnamon 500 MG capsule, Take 500 mg by mouth daily., Disp: , Rfl:    empagliflozin (JARDIANCE) 10 MG TABS tablet, Take 1 tablet (10 mg total) by mouth daily before breakfast., Disp: 30 tablet, Rfl: 11   furosemide (LASIX) 80 MG tablet, Take 1 tablet (80 mg total) by mouth every morning., Disp: 90 tablet, Rfl: 3  Glucosamine-Chondroitin (OSTEO BI-FLEX REGULAR STRENGTH PO), Take 1 tablet by mouth daily., Disp: , Rfl:    levothyroxine (SYNTHROID) 88 MCG tablet, Take 1 tablet (88 mcg total) by mouth daily before breakfast., Disp: 30 tablet, Rfl: 11   loratadine (CLARITIN) 10 MG tablet, Take 10 mg by mouth daily., Disp: , Rfl:    metoprolol succinate (TOPROL-XL) 100 MG 24 hr tablet, Take 1 tablet (100 mg total) by mouth daily. ., Disp: 90 tablet, Rfl: 3   Omega-3 Fatty Acids (FISH OIL) 1200 MG CPDR, Take 1,200 mg by mouth daily., Disp: , Rfl:    sacubitril-valsartan (ENTRESTO) 24-26 MG, Take 1 tablet by mouth 2 (two) times daily. Needs doctor  appointment for further refill., Disp: 120 tablet, Rfl: 0   simvastatin (ZOCOR) 20 MG tablet, TAKE 1 TABLET EVERY DAY AT 6 PM, Disp: 90 tablet, Rfl: 3   TOUJEO SOLOSTAR 300 UNIT/ML Solostar Pen, INJECT 24 UNITS INTO THE SKIN EVERY DAY, Disp: 4.5 mL, Rfl: 3   warfarin (COUMADIN) 2 MG tablet, TAKE 1 TO 2 TABLETS ONCE A DAY AS DIRECTED BY ANTICOAGULATION., Disp: 40 tablet, Rfl: 5  Social History   Tobacco Use  Smoking Status Never  Smokeless Tobacco Never    Allergies  Allergen Reactions   Amiodarone Rash   Codeine Other (See Comments)    Euphoria, hallucinations   Other Palpitations    Other reaction(s): Other (See Comments) PT STATES SHE GETS REALLY HOT , DIZZY HEADED, HAS PASSED OUT TWICE , AND ITCHING WITH IV DYE Tumeric   Colchicine Rash   Iodine Rash    topical   Objective:  There were no vitals filed for this visit. There is no height or weight on file to calculate BMI. Constitutional Well developed. Well nourished.  Vascular Dorsalis pedis pulses palpable bilaterally. Posterior tibial pulses palpable bilaterally. Capillary refill normal to all digits.  No cyanosis or clubbing noted. Pedal hair growth normal.  Neurologic Normal speech. Oriented to person, place, and time. Epicritic sensation to light touch grossly present bilaterally.  Dermatologic Nails well groomed and normal in appearance. No open wounds. No skin lesions.  Orthopedic: Normal joint ROM without pain or crepitus bilaterally. No visible deformities. Tender to palpation at the calcaneal tuber right. No pain with calcaneal squeeze right. Ankle ROM diminished range of motion right. Silfverskiold Test: positive right.   Radiographs: Taken and reviewed. No acute fractures or dislocations. No evidence of stress fracture.  Plantar heel spur present. Posterior heel spur present.  Pes cavus foot structure  Assessment:   1. Plantar fasciitis of right foot   2. Heel spur, right    Plan:  Patient was  evaluated and treated and all questions answered.  Plantar Fasciitis, right with underlying heel spur - XR reviewed as above.  - Educated on icing and stretching. Instructions given.  - Injection delivered to the plantar fascia as below. - DME: Plantar fascial brace dispensed to support the medial longitudinal arch of the foot and offload pressure from the heel and prevent arch collapse during weightbearing - Pharmacologic management: None  Procedure: Injection Tendon/Ligament Location: Right plantar fascia at the glabrous junction; medial approach. Skin Prep: alcohol Injectate: 0.5 cc 0.5% marcaine plain, 0.5 cc of 1% Lidocaine, 0.5 cc kenalog 10. Disposition: Patient tolerated procedure well. Injection site dressed with a band-aid.  Return in about 4 weeks (around 04/09/2021) for wagoner .

## 2021-03-17 NOTE — Progress Notes (Signed)
EPIC Encounter for ICM Monitoring  Patient Name: Emily [name] is a 71 y.o. female Date: 03/17/2021 Primary Care Physican: Ezequiel Essex, MD Electrophysiologist: Santina Evans Pacing: 84.7%  03/05/2021 Weight: 133 -134 lbs 03/17/2021 Weight: 132 lbs   Time in AT/AF 24.0 hr/day (100.0%) Longest AT/AF            41 months   Battery ERI:  3 months         Spoke with patient and heart failure questions reviewed.  Pt is asymptomatic for fluid symptoms.         Optivol Thoracic impedance suggesting fluid levels returned normal.    Prescribed: Furosemide 80 mg Take 1 tablet (80 mg total) by mouth every morning.   Labs: 12/28/2020 Creatinine 2.18, BUN 25, Potassium 4.2, Sodium 136, GFR 23 12/07/2020 Creatinine 2.07, BUN 25, Potassium 3.2, Sodium 137, GFR 25 12/07/2020 Creatinine 1.97, BUN 23, Potassium 3.8, Sodium 139 11/10/2020 Creatinine 1.73, BUN 24, Potassium 4.3, Sodium 140, GFR 31 07/17/2020 Creatinine 2.67, BUN 27, Potassium 4.9, Sodium 138 07/03/2020 Creatinine 2.42, BUN 32, Potassium 5.0, Sodium 137, GFR 21 06/26/2020 Creatinine 1.99, BUN 23, Potassium 5.5, Sodium 140, GFR 26  06/09/2020 Creatinine 2.41, BUN 31, Potassium 4.8, Sodium 139 05/26/2020 Creatinine 2.32, BUN 40, Potassium 4.8, Sodium 137, GFR 22  A complete set of results can be found in Results Review.   Recommendations:  No changes and encouraged to call if experiencing any fluid symptoms.    Follow-up plan: ICM clinic phone appointment on 04/08/2021.  91 day device clinic remote transmission 05/09/2021.      EP/Cardiology Office Visits:  03/29/2021 with Dr Aundra Dubin.       Copy of ICM check sent to Dr. Lovena Le.    3 month ICM trend: 03/15/2021.    12-14 Month ICM trend:     [name], RN 03/17/2021 4:00 PM

## 2021-03-23 ENCOUNTER — Encounter (HOSPITAL_COMMUNITY): Payer: PPO

## 2021-03-26 NOTE — Progress Notes (Signed)
Remote ICD transmission.   

## 2021-03-29 ENCOUNTER — Encounter (HOSPITAL_COMMUNITY): Payer: PPO | Admitting: Cardiology

## 2021-04-08 ENCOUNTER — Ambulatory Visit (INDEPENDENT_AMBULATORY_CARE_PROVIDER_SITE_OTHER): Payer: PPO

## 2021-04-08 DIAGNOSIS — I5022 Chronic systolic (congestive) heart failure: Secondary | ICD-10-CM | POA: Diagnosis not present

## 2021-04-08 DIAGNOSIS — Z95810 Presence of automatic (implantable) cardiac defibrillator: Secondary | ICD-10-CM

## 2021-04-09 ENCOUNTER — Ambulatory Visit: Payer: PPO | Admitting: Podiatry

## 2021-04-09 NOTE — Progress Notes (Signed)
EPIC Encounter for ICM Monitoring  Patient Name: Emily [name] is a 71 y.o. female Date: 04/09/2021 Primary Care Physican: Ezequiel Essex, MD Primary Cardiologist: [facility] Electrophysiologist: Santina Evans Pacing: 82.8%  04/09/2021 Weight: 132 lbs   Time in AT/AF 24.0 hr/day (100.0%) Longest AT/AF            42 months   Battery ERI:  2 months         Spoke with patient and heart failure questions reviewed.  Pt asymptomatic for fluid accumulation.  Reports feeling well at this time and voices no complaints.          Optivol Thoracic impedance suggesting possible fluid accumulation starting 2/5.    Prescribed: Furosemide 80 mg Take 1 tablet (80 mg total) by mouth every morning.   Labs: 12/28/2020 Creatinine 2.18, BUN 25, Potassium 4.2, Sodium 136, GFR 23 12/07/2020 Creatinine 2.07, BUN 25, Potassium 3.2, Sodium 137, GFR 25 12/07/2020 Creatinine 1.97, BUN 23, Potassium 3.8, Sodium 139 11/10/2020 Creatinine 1.73, BUN 24, Potassium 4.3, Sodium 140, GFR 31 07/17/2020 Creatinine 2.67, BUN 27, Potassium 4.9, Sodium 138 07/03/2020 Creatinine 2.42, BUN 32, Potassium 5.0, Sodium 137, GFR 21 06/26/2020 Creatinine 1.99, BUN 23, Potassium 5.5, Sodium 140, GFR 26  06/09/2020 Creatinine 2.41, BUN 31, Potassium 4.8, Sodium 139 05/26/2020 Creatinine 2.32, BUN 40, Potassium 4.8, Sodium 137, GFR 22  A complete set of results can be found in Results Review.   Recommendations:  Pt self adjusts Furosemide as needed and will take extra 0.5 tablet today or tomorrow.   Follow-up plan: ICM clinic phone appointment on 04/14/2021 to recheck fluid levels.  91 day device clinic remote transmission 05/10/2021.      EP/Cardiology Office Visits:  04/14/2021 with Dr Aundra Dubin.       Copy of ICM check sent to Dr. Lovena Le.    3 month ICM trend: 04/08/2021.    12-14 Month ICM trend:     [name], RN 04/09/2021 8:39 AM

## 2021-04-10 ENCOUNTER — Other Ambulatory Visit: Payer: Self-pay | Admitting: Family Medicine

## 2021-04-12 ENCOUNTER — Ambulatory Visit (INDEPENDENT_AMBULATORY_CARE_PROVIDER_SITE_OTHER): Payer: PPO

## 2021-04-12 ENCOUNTER — Other Ambulatory Visit: Payer: Self-pay | Admitting: Family Medicine

## 2021-04-12 ENCOUNTER — Other Ambulatory Visit (HOSPITAL_COMMUNITY): Payer: Self-pay | Admitting: Cardiology

## 2021-04-12 ENCOUNTER — Telehealth: Payer: Self-pay

## 2021-04-12 DIAGNOSIS — I5022 Chronic systolic (congestive) heart failure: Secondary | ICD-10-CM

## 2021-04-12 DIAGNOSIS — Z95810 Presence of automatic (implantable) cardiac defibrillator: Secondary | ICD-10-CM

## 2021-04-12 DIAGNOSIS — K219 Gastro-esophageal reflux disease without esophagitis: Secondary | ICD-10-CM

## 2021-04-12 MED ORDER — OMEPRAZOLE 40 MG PO CPDR: 40.0000 mg | DELAYED_RELEASE_CAPSULE | Freq: Every day | ORAL | 1 refills | Status: DC

## 2021-04-12 NOTE — Progress Notes (Signed)
EPIC Encounter for ICM Monitoring  Patient Name: Emily [name] is a 71 y.o. female Date: 04/12/2021 Primary Care Physican: Ezequiel Essex, MD Primary Cardiologist: [facility] Electrophysiologist: Santina Evans Pacing: 82.8%  04/09/2021 Weight: 132 lbs   Time in AT/AF 24.0 hr/day (100.0%) Longest AT/AF            42 months   Battery ERI reached 2/13 and pt has called office to get battery replacement.         Spoke with patient and heart failure questions reviewed.  Pt asymptomatic for fluid accumulation.  Reports feeling well at this time and voices no complaints.          Optivol Thoracic impedance suggesting fluid levels returned to normal after taking extra Furosemide.    Prescribed: Furosemide 80 mg Take 1 tablet (80 mg total) by mouth every morning.  She self adjusts Furosemide as needed.    Labs: 12/28/2020 Creatinine 2.18, BUN 25, Potassium 4.2, Sodium 136, GFR 23 12/07/2020 Creatinine 2.07, BUN 25, Potassium 3.2, Sodium 137, GFR 25 12/07/2020 Creatinine 1.97, BUN 23, Potassium 3.8, Sodium 139 11/10/2020 Creatinine 1.73, BUN 24, Potassium 4.3, Sodium 140, GFR 31 07/17/2020 Creatinine 2.67, BUN 27, Potassium 4.9, Sodium 138 A complete set of results can be found in Results Review.   Recommendations:  No changes and encouraged to call if experiencing any fluid symptoms.   Follow-up plan: ICM clinic phone appointment on 05/11/2021.  91 day device clinic remote transmission 05/10/2021.      EP/Cardiology Office Visits:  04/14/2021 with Dr Aundra Dubin.       Copy of ICM check sent to Dr. Lovena Le.   3 month ICM trend: 04/12/2021.    12-14 Month ICM trend:     [name], RN 04/12/2021 4:10 PM

## 2021-04-12 NOTE — Telephone Encounter (Signed)
The patient called because her ICD was playing music. I let her know that as of today her battery reached RRT 04/12/2021. I told her the scheduler will give her a call to make an appointment with Dr. Lovena Le or the PA.

## 2021-04-14 ENCOUNTER — Encounter (HOSPITAL_COMMUNITY): Payer: Self-pay | Admitting: Cardiology

## 2021-04-14 ENCOUNTER — Other Ambulatory Visit: Payer: Self-pay

## 2021-04-14 ENCOUNTER — Ambulatory Visit (HOSPITAL_COMMUNITY)
Admission: RE | Admit: 2021-04-14 | Discharge: 2021-04-14 | Disposition: A | Discharge status: Home / Self Care | Payer: PPO | Source: Ambulatory Visit | Attending: Cardiology | Admitting: Cardiology

## 2021-04-14 ENCOUNTER — Other Ambulatory Visit (HOSPITAL_COMMUNITY): Payer: Self-pay | Admitting: Cardiology

## 2021-04-14 VITALS — BP 94/50 | HR 81 | Wt 135.2 lb

## 2021-04-14 DIAGNOSIS — Z7984 Long term (current) use of oral hypoglycemic drugs: Secondary | ICD-10-CM | POA: Diagnosis not present

## 2021-04-14 DIAGNOSIS — Z79899 Other long term (current) drug therapy: Secondary | ICD-10-CM | POA: Diagnosis not present

## 2021-04-14 DIAGNOSIS — I5022 Chronic systolic (congestive) heart failure: Secondary | ICD-10-CM | POA: Diagnosis present

## 2021-04-14 DIAGNOSIS — I4891 Unspecified atrial fibrillation: Secondary | ICD-10-CM

## 2021-04-14 DIAGNOSIS — I4821 Permanent atrial fibrillation: Secondary | ICD-10-CM | POA: Diagnosis not present

## 2021-04-14 DIAGNOSIS — I428 Other cardiomyopathies: Secondary | ICD-10-CM | POA: Insufficient documentation

## 2021-04-14 DIAGNOSIS — E1122 Type 2 diabetes mellitus with diabetic chronic kidney disease: Secondary | ICD-10-CM | POA: Diagnosis not present

## 2021-04-14 DIAGNOSIS — I493 Ventricular premature depolarization: Secondary | ICD-10-CM

## 2021-04-14 DIAGNOSIS — M109 Gout, unspecified: Secondary | ICD-10-CM | POA: Diagnosis not present

## 2021-04-14 DIAGNOSIS — N184 Chronic kidney disease, stage 4 (severe): Secondary | ICD-10-CM | POA: Insufficient documentation

## 2021-04-14 DIAGNOSIS — I350 Nonrheumatic aortic (valve) stenosis: Secondary | ICD-10-CM | POA: Diagnosis not present

## 2021-04-14 DIAGNOSIS — I5023 Acute on chronic systolic (congestive) heart failure: Secondary | ICD-10-CM | POA: Diagnosis not present

## 2021-04-14 DIAGNOSIS — Z7901 Long term (current) use of anticoagulants: Secondary | ICD-10-CM | POA: Diagnosis not present

## 2021-04-14 LAB — CBC
HCT: 37 % (ref 36.0–46.0)
Hemoglobin: 12.3 g/dL (ref 12.0–15.0)
MCH: 31.7 pg (ref 26.0–34.0)
MCHC: 33.2 g/dL (ref 30.0–36.0)
MCV: 95.4 fL (ref 80.0–100.0)
Platelets: 211 10*3/uL (ref 150–400)
RBC: 3.88 MIL/uL (ref 3.87–5.11)
RDW: 14.5 % (ref 11.5–15.5)
WBC: 8.6 10*3/uL (ref 4.0–10.5)
nRBC: 0 % (ref 0.0–0.2)

## 2021-04-14 LAB — BRAIN NATRIURETIC PEPTIDE: B Natriuretic Peptide: 414.2 pg/mL — ABNORMAL HIGH (ref 0.0–100.0)

## 2021-04-14 LAB — BASIC METABOLIC PANEL
Anion gap: 12 (ref 5–15)
BUN: 33 mg/dL — ABNORMAL HIGH (ref 8–23)
CO2: 25 mmol/L (ref 22–32)
Calcium: 8.5 mg/dL — ABNORMAL LOW (ref 8.9–10.3)
Chloride: 102 mmol/L (ref 98–111)
Creatinine, Ser: 2.02 mg/dL — ABNORMAL HIGH (ref 0.44–1.00)
GFR, Estimated: 26 mL/min — ABNORMAL LOW (ref >60)
Glucose, Bld: 196 mg/dL — ABNORMAL HIGH (ref 70–99)
Potassium: 3.6 mmol/L (ref 3.5–5.1)
Sodium: 139 mmol/L (ref 135–145)

## 2021-04-14 MED ORDER — METOPROLOL SUCCINATE ER 100 MG PO TB24: ORAL_TABLET | ORAL | 5 refills | Status: DC

## 2021-04-14 NOTE — Patient Instructions (Signed)
Medication Changes:  Change your Toprol Xl to 100mg  in the morning and 50 mg in the evening  Lab Work:  Labs done today, your results will be available in MyChart, we will contact you for abnormal readings.   Testing/Procedures:  none  Referrals:  none  Special Instructions // Education:  none  Follow-Up in: 3 months  At the [facility], you and your health needs are our priority. We have a designated team specialized in the treatment of Heart Failure. This Care Team includes your primary Heart Failure Specialized Cardiologist (physician), Advanced Practice Providers (APPs- Physician Assistants and Nurse Practitioners), and Pharmacist who all work together to provide you with the care you need, when you need it.   You may see any of the following providers on your designated Care Team at your next follow up:  Dr Glori Bickers Dr Haynes Kerns, NP Lyda Jester, [place] [facility], [place] Audry Riles, PharmD   Please be sure to bring in all your medications bottles to every appointment.   Need to Contact us:  If you have any questions or concerns before your next appointment please send [place] a message through [place] or call our office at [phone].    TO LEAVE A MESSAGE FOR THE NURSE SELECT OPTION 2, PLEASE LEAVE A MESSAGE INCLUDING: YOUR NAME DATE OF BIRTH CALL BACK NUMBER REASON FOR CALL**this is important as we prioritize the call backs  YOU WILL RECEIVE A CALL BACK THE SAME DAY AS LONG AS YOU CALL BEFORE 4:00 PM

## 2021-04-15 ENCOUNTER — Ambulatory Visit (INDEPENDENT_AMBULATORY_CARE_PROVIDER_SITE_OTHER): Payer: PPO | Admitting: *Deleted

## 2021-04-15 ENCOUNTER — Ambulatory Visit (INDEPENDENT_AMBULATORY_CARE_PROVIDER_SITE_OTHER): Payer: PPO

## 2021-04-15 DIAGNOSIS — Z5181 Encounter for therapeutic drug level monitoring: Secondary | ICD-10-CM

## 2021-04-15 DIAGNOSIS — I4891 Unspecified atrial fibrillation: Secondary | ICD-10-CM | POA: Diagnosis not present

## 2021-04-15 DIAGNOSIS — I513 Intracardiac thrombosis, not elsewhere classified: Secondary | ICD-10-CM | POA: Diagnosis not present

## 2021-04-15 DIAGNOSIS — I5022 Chronic systolic (congestive) heart failure: Secondary | ICD-10-CM

## 2021-04-15 LAB — CUP PACEART REMOTE DEVICE CHECK
Battery Remaining Longevity: 2 mo
Battery Voltage: 2.71 V
Brady Statistic RA Percent Paced: 0.01 %
Brady Statistic RV Percent Paced: 91.01 %
Date Time Interrogation Session: 20230216021506
HighPow Impedance: 65 Ohm
Implantable Lead Implant Date: 20050218
Implantable Lead Implant Date: 20050218
Implantable Lead Implant Date: 20050218
Implantable Lead Location: 753858
Implantable Lead Location: 753859
Implantable Lead Location: 753860
Implantable Lead Model: 4194
Implantable Lead Model: 5076
Implantable Lead Model: 6947
Implantable Pulse Generator Implant Date: 20160413
Lead Channel Impedance Value: 361 Ohm
Lead Channel Impedance Value: 399 Ohm
Lead Channel Impedance Value: 418 Ohm
Lead Channel Impedance Value: 589 Ohm
Lead Channel Impedance Value: 703 Ohm
Lead Channel Impedance Value: 931 Ohm
Lead Channel Pacing Threshold Amplitude: 0.75 V
Lead Channel Pacing Threshold Amplitude: 0.875 V
Lead Channel Pacing Threshold Amplitude: 1.375 V
Lead Channel Pacing Threshold Pulse Width: 0.4 ms
Lead Channel Pacing Threshold Pulse Width: 0.4 ms
Lead Channel Pacing Threshold Pulse Width: 0.4 ms
Lead Channel Sensing Intrinsic Amplitude: 0.25 mV
Lead Channel Sensing Intrinsic Amplitude: 0.25 mV
Lead Channel Sensing Intrinsic Amplitude: 2.125 mV
Lead Channel Sensing Intrinsic Amplitude: 2.125 mV
Lead Channel Setting Pacing Amplitude: 2 V
Lead Channel Setting Pacing Amplitude: 2.25 V
Lead Channel Setting Pacing Amplitude: 2.75 V
Lead Channel Setting Pacing Pulse Width: 0.4 ms
Lead Channel Setting Pacing Pulse Width: 0.4 ms
Lead Channel Setting Sensing Sensitivity: 0.3 mV

## 2021-04-15 LAB — POCT INR: INR: 3.1 — AB (ref 2.0–3.0)

## 2021-04-15 NOTE — Progress Notes (Signed)
PCP: Ezequiel Essex, MD Cardiology: Dr. Domenic Polite HF Cardiology: Dr. Aundra Dubin  71 y.o. with history of CKD stage IV, permanent atrial fibrillation, and a long-standing nonischemic cardiomyopathy was referred by Dr. Domenic Polite for CHF clinic evaluation.  She first developed cardiac problems in 1988.  At that time, she was admitted with CHF and eventually underwent myocardial biopsy at [facility], per her report showing myocarditis.  Patient states that her EF has been low since then.  She had MDT CRT-D device implanted.  Echo in 7/21 showed EF 20-25%, moderate MR, mild AS. She is now in permanent atrial fibrillation and is s/p AV nodal ablation with BiV pacing. Elevated creatinine has limited medication management.    Echo in 10/22 showed EF 25-30%, severe LV dilation, RV normal, moderate TR, mod-severe AS with mean gradient 9 mmHg but AVA 0.76 cm^2 (?low gradient severe AS), mild-moderate MR, left=>right shunt possible ASD.  TEE done in 10/22 to more closely assess AS.  This showed EF 30%, moderate LV dilation, normal RV, possible calcified vegetation on the ICD lead, small secundum ASD, peak RV-RA gradient 23 mmHg, low flow/low gradient moderate AS, mild-moderate MR. RHC/LHC was done, showing mild nonobstructive CAD, left to right atrial level shunt with Qp/Qs 1.43.  I reviewed TEE and cath report with Dr. Burt Knack.  We decided that AS was not severe enough yet for TAVR and that the ASD was not significant enough to close percutaneously. With possible calcified vegetation on ICD, we drew blood cultures which were negative.  She saw ID and had a PET-CT, which did not suggest active endocarditis.   Patient was found on Zio patch in 10/22 to have 12.6% PVCs, this was thought to be limiting her BiV pacing.  She has a history of intolerance of amiodarone so was started on mexiletine.  She says that this caused a rash so she stopped it and rash resolved.   Today she returns for HF followup.  She still periodically gets  short of breath with activities such as doing the laundry and cooking.  No dyspnea walking on flat ground.  No chest pain.  No lightheadedness.  No orthopnea/PND.    Labs (1/21): LDL 118 Labs (9/21): K 4.4, creatinine 1.93 Labs (12/21): K 4, creatinine 1.77 Labs (2/22): TSH normal Labs (5/22): K 5, creatinine 2.42 Labs (9/22): K 4.3, creatinine 1.73, LDL 90 Labs (10/22): K 4.2, creatinine 2.18  Medtronic device interrogation: Bi V pacing 86.5%. Stable thoracic impedance.   PMH: 1. Gout 2. Complete heart block: Medtronic CRT device.  3. CKD stage IV: Diabetic nephropathy.  4. Type 2 diabetes 5. Atrial fibrillation: Now permanent.   - AF ablation 4/18.  - AV nodal ablation in 2019 with MDT CRT-D.  6. Remote LV thrombus 7. Chronic systolic CHF: Nonischemic cardiomyopathy. Medtronic CRT-D device.  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  - LHC 2004: Nonobstructive mild CAD.  - Echo (8/19): EF 10-15%, mildly dilated LV, mild-moderately dilated RV with mild-moderately decreased systolic function.  - Echo (7/21): EF 20-25%, low normal RV function, moderate MR, mild AS.  - Echo (10/22): EF 25-30%, severe LV dilation, RV normal, moderate TR, mod-severe AS with mean gradient 9 mmHg but AVA 0.76 cm^2 (?low gradient severe AS), mild-moderate MR, left=>right shunt possible ASD.  - RHC/LHC (10/22): mild nonobstructive CAD, left to right atrial level shunt with Qp/Qs 1.43.  8. H/o cholecystectomy 9. Hypothyroidism 10. 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 Aortic stenosis: TEE (10/22) showed EF  30%, moderate LV dilation, normal RV, possible calcified vegetation on the ICD lead, small secundum ASD, peak RV-RA gradient 23 mmHg, low flow/low gradient moderate AS, mild-moderate MR.  13. 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  89. 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  - RHC in 10/22 showed  atrial level shunt with Qp/Qs 1.43.  15. PVCs: Zio monitor (10/22) with 12.6% PVCs. - Intolerant of both amiodarone and mexiletine.   SH: Married, lives in [name].  No smoking or ETOH.   Family History  Problem Relation Age of Onset   Hip fracture Mother    COPD Father    CAD Sister    Heart attack Sister    ROS: All systems reviewed and negative except as per HPI.   Current Outpatient Medications  Medication Sig Dispense Refill   acetaminophen (TYLENOL) 500 MG tablet Take 1,000 mg by mouth every 6 (six) hours as needed for mild pain.     allopurinol (ZYLOPRIM) 100 MG tablet Take 100-200 mg by mouth See admin instructions. Take 200 mg in the morning 100 mg at bedtime     Cinnamon 500 MG capsule Take 500 mg by mouth daily.     ENTRESTO 24-26 MG TAKE (1) TABLET TWICE DAILY. 60 tablet 0   furosemide (LASIX) 80 MG tablet Take 1 tablet (80 mg total) by mouth every morning. 90 tablet 3   Glucosamine-Chondroitin (OSTEO BI-FLEX REGULAR STRENGTH PO) Take 1 tablet by mouth daily.     levothyroxine (SYNTHROID) 88 MCG tablet Take 1 tablet (88 mcg total) by mouth daily before breakfast. 30 tablet 11   loratadine (CLARITIN) 10 MG tablet Take 10 mg by mouth daily.     Omega-3 Fatty Acids (FISH OIL) 1200 MG CPDR Take 1,200 mg by mouth daily.     omeprazole (PRILOSEC) 40 MG capsule TAKE 1 CAPSULE BY MOUTH ONCE DAILY. 90 capsule 3   simvastatin (ZOCOR) 20 MG tablet TAKE 1 TABLET EVERY DAY AT 6 PM 90 tablet 3   TOUJEO SOLOSTAR 300 UNIT/ML Solostar Pen INJECT 24 UNITS INTO THE SKIN EVERY DAY 4.5 mL 3   warfarin (COUMADIN) 2 MG tablet TAKE 1 TO 2 TABLETS ONCE A DAY AS DIRECTED BY ANTICOAGULATION. 40 tablet 5   JARDIANCE 10 MG TABS tablet TAKE 1 TABLET ONCE A DAY BEFORE BREAKFAST. 30 tablet 11   metoprolol succinate (TOPROL-XL) 100 MG 24 hr tablet Take 1 tablet (100 mg total) by mouth every morning AND 0.5 tablets (50 mg total) every evening. .. 90 tablet 5   No current facility-administered medications  for this encounter.   BP (!) 94/50    Pulse 81    Wt 61.3 kg (135 lb 3.2 oz)    SpO2 98%    BMI 23.95 kg/m  Wt Readings from Last 3 Encounters:  04/14/21 61.3 kg (135 lb 3.2 oz)  12/28/20 60.7 kg (133 lb 12.8 oz)  12/23/20 61.7 kg (136 lb)   General: NAD Neck: No JVD, no thyromegaly or thyroid nodule.  Lungs: Clear to auscultation bilaterally with normal respiratory effort. CV: Nondisplaced PMI.  Heart regular S1/S2, no S3/S4, 1/6 SEM RUSB.  No peripheral edema.  No carotid bruit.  Normal pedal pulses.  Abdomen: Soft, nontender, no hepatosplenomegaly, no distention.  Skin: Intact without lesions or rashes.  Neurologic: Alert and oriented x 3.  Psych: Normal affect. Extremities: No clubbing or cyanosis.  HEENT: Normal.   Assessment/Plan: 1. Chronic systolic CHF:  Nonischemic cardiomyopathy.  Low EF for years.  Per patient's report, she had an endomyocardial biopsy at [facility] in 1988 showing myocarditis.  Medication titration has been limited by CKD stage IV. MDT CRT-D device.   Echo in 7/21 showed EF 20-25%, moderate MR, mild AS.  Echo 12/17/20 showed EF 25-30%, severe LV dilation, RV normal, moderate TR, mod-severe AS with mean gradient 9 mmHg but AVA 0.76 cm^2.  TEE in 10/22 with EF 30%.  LHC in 10/22 with mild nonobstructive CAD. She is only BiV pacing 86.5% of the time today, likely due to frequent PVCs.  By exam and Optivol, she is not volume overloaded. NYHA class II-III symptoms.   - Continue Lasix 80 mg daily.  BMET/BNP today.  - Continue Jardiance 10 mg daily.  - Continue Entresto 24/26 bid.  - With frequent PVCs, increase Toprol XL to 100 qam/50 qpm.   - If creatinine remains stable, add spironolactone next.  2.  Atrial fibrillation: Permanent.  S/p AV nodal ablation with BiV pacing.   - Continue warfarin.   4. PVCs: Frequent.  Decreasing BiV pacing percentage (86.5% today).  Zio 10/22 with 12.6% PVCs.  - Increasing Toprol XL as above.  - Unable to tolerate amiodarone in the past.   - She had a rash with mexiletine and stopped it.  - She will see Dr. Lovena Le soon, will see if he has further ideas for decreasing PVCs/increasing BiV pacing.  5. Aortic stenosis: TEE in 10/22 showed low flow/low gradient moderate aortic stenosis.  Discussed with Dr. Burt Knack, not at point where she needs TAVR yet.  - Repeat echo around 10/23.  6. ASD: Patient has an ASD by echo with L=>R shunting, small.  Qp/Qs by RHC was 1.43. Reviewed with Dr Burt Knack, do not think repair indicated at this point.  7. Gout: Stable.  8. Calcified device lead vegetation: Blood cultures negative, PET-CT in 11/22 not suggestive of active endocarditis.   Followup in 3 months.   Loralie Champagne 04/15/2021

## 2021-04-15 NOTE — Patient Instructions (Signed)
Take warfarin 1 tablet tonight then resume 1 tablet daily except 2 tablets on Mondays and Thursdays.  Recheck in 6 weeks -Call [phone] if started on any new medications.

## 2021-04-20 ENCOUNTER — Telehealth (HOSPITAL_COMMUNITY): Payer: Self-pay | Admitting: Cardiology

## 2021-04-20 MED ORDER — METOPROLOL SUCCINATE ER 50 MG PO TB24: 50.0000 mg | ORAL_TABLET | Freq: Every evening | ORAL | 3 refills | Status: DC

## 2021-04-20 MED ORDER — METOPROLOL SUCCINATE ER 100 MG PO TB24: 100.0000 mg | ORAL_TABLET | Freq: Every morning | ORAL | 5 refills | Status: DC

## 2021-04-20 NOTE — Progress Notes (Signed)
Remote ICD transmission.   

## 2021-04-20 NOTE — Telephone Encounter (Signed)
Pt called to request 50 mg tablet for PM dose  Pt take 100 mg in the AM and 50 mg in the PM  Script split as patient requested

## 2021-04-27 ENCOUNTER — Other Ambulatory Visit (HOSPITAL_COMMUNITY)
Admission: RE | Admit: 2021-04-27 | Discharge: 2021-04-27 | Disposition: A | Discharge status: Home / Self Care | Payer: PPO | Source: Ambulatory Visit | Attending: Internal Medicine | Admitting: Internal Medicine

## 2021-04-27 ENCOUNTER — Ambulatory Visit (INDEPENDENT_AMBULATORY_CARE_PROVIDER_SITE_OTHER): Payer: PPO | Admitting: Internal Medicine

## 2021-04-27 ENCOUNTER — Encounter: Payer: Self-pay | Admitting: Internal Medicine

## 2021-04-27 ENCOUNTER — Telehealth: Payer: Self-pay | Admitting: Internal Medicine

## 2021-04-27 ENCOUNTER — Other Ambulatory Visit: Payer: Self-pay

## 2021-04-27 ENCOUNTER — Encounter: Payer: Self-pay | Admitting: *Deleted

## 2021-04-27 VITALS — BP 124/78 | HR 84 | Ht 62.0 in | Wt 133.0 lb

## 2021-04-27 DIAGNOSIS — I5023 Acute on chronic systolic (congestive) heart failure: Secondary | ICD-10-CM

## 2021-04-27 LAB — CBC
HCT: 39.4 % (ref 36.0–46.0)
Hemoglobin: 12.7 g/dL (ref 12.0–15.0)
MCH: 31.4 pg (ref 26.0–34.0)
MCHC: 32.2 g/dL (ref 30.0–36.0)
MCV: 97.5 fL (ref 80.0–100.0)
Platelets: 274 10*3/uL (ref 150–400)
RBC: 4.04 MIL/uL (ref 3.87–5.11)
RDW: 14.3 % (ref 11.5–15.5)
WBC: 8.8 10*3/uL (ref 4.0–10.5)
nRBC: 0 % (ref 0.0–0.2)

## 2021-04-27 LAB — BASIC METABOLIC PANEL
Anion gap: 13 (ref 5–15)
BUN: 47 mg/dL — ABNORMAL HIGH (ref 8–23)
CO2: 25 mmol/L (ref 22–32)
Calcium: 8.7 mg/dL — ABNORMAL LOW (ref 8.9–10.3)
Chloride: 97 mmol/L — ABNORMAL LOW (ref 98–111)
Creatinine, Ser: 2.42 mg/dL — ABNORMAL HIGH (ref 0.44–1.00)
GFR, Estimated: 21 mL/min — ABNORMAL LOW (ref >60)
Glucose, Bld: 261 mg/dL — ABNORMAL HIGH (ref 70–99)
Potassium: 3.8 mmol/L (ref 3.5–5.1)
Sodium: 135 mmol/L (ref 135–145)

## 2021-04-27 NOTE — Telephone Encounter (Signed)
PERCERT:  Gen change on March 13   Dr. Lovena Le

## 2021-04-27 NOTE — H&P (View-Only) (Signed)
HPI [name] returns today for followup. She is a pleasant 71 yo woman with a h/o both atrial and ventricular arrhythmias. She has undergone AV node ablation. She has persistent atrial fib. She has a h/o dietary indiscretion and has undergone adjustments of her meds and dietary change. She has managed to avoid the hospital in the last year. She has reached ERI on her device.  Allergies  Allergen Reactions   Amiodarone Rash   Codeine Other (See Comments)    Euphoria, hallucinations   Other Palpitations    Other reaction(s): Other (See Comments) PT STATES SHE GETS REALLY HOT , DIZZY HEADED, HAS PASSED OUT TWICE , AND ITCHING WITH IV DYE Tumeric   Colchicine Rash   Iodine Rash    topical     Current Outpatient Medications  Medication Sig Dispense Refill   acetaminophen (TYLENOL) 500 MG tablet Take 1,000 mg by mouth every 6 (six) hours as needed for mild pain.     allopurinol (ZYLOPRIM) 100 MG tablet Take 100-200 mg by mouth See admin instructions. Take 200 mg in the morning 100 mg at bedtime     Cinnamon 500 MG capsule Take 500 mg by mouth daily.     ENTRESTO 24-26 MG TAKE (1) TABLET TWICE DAILY. 60 tablet 0   furosemide (LASIX) 80 MG tablet Take 1 tablet (80 mg total) by mouth every morning. 90 tablet 3   Glucosamine-Chondroitin (OSTEO BI-FLEX REGULAR STRENGTH PO) Take 1 tablet by mouth daily.     JARDIANCE 10 MG TABS tablet TAKE 1 TABLET ONCE A DAY BEFORE BREAKFAST. 30 tablet 11   levothyroxine (SYNTHROID) 88 MCG tablet Take 1 tablet (88 mcg total) by mouth daily before breakfast. 30 tablet 11   loratadine (CLARITIN) 10 MG tablet Take 10 mg by mouth daily.     metoprolol succinate (TOPROL-XL) 100 MG 24 hr tablet Take 1 tablet (100 mg total) by mouth every morning. . 90 tablet 5   metoprolol succinate (TOPROL-XL) 50 MG 24 hr tablet Take 1 tablet (50 mg total) by mouth every evening. Take with or immediately following a meal. 90 tablet 3   Omega-3 Fatty Acids (FISH OIL) 1200  MG CPDR Take 1,200 mg by mouth daily.     omeprazole (PRILOSEC) 40 MG capsule TAKE 1 CAPSULE BY MOUTH ONCE DAILY. 90 capsule 3   simvastatin (ZOCOR) 20 MG tablet TAKE 1 TABLET EVERY DAY AT 6 PM 90 tablet 3   TOUJEO SOLOSTAR 300 UNIT/ML Solostar Pen INJECT 24 UNITS INTO THE SKIN EVERY DAY 4.5 mL 3   warfarin (COUMADIN) 2 MG tablet TAKE 1 TO 2 TABLETS ONCE A DAY AS DIRECTED BY ANTICOAGULATION. 40 tablet 5   No current facility-administered medications for this visit.     Past Medical History:  Diagnosis Date   Acute pain of right shoulder 03/22/2019   Acute renal failure ([place]) 04/15/2012   Last Assessment & Plan:  Superimposed on CK D stage III, Baseline Cr ~2-2.5, remains above baseline, felt to be due to Cardiorenal syndrome, creatinine trending down at time of discharge Negative urine eos and no urinary retention ACE inhibitor discontinued Follows with Dr Karen Chafe as outpatient, to have labs drawn 08/15/16 and results sent to [facility]phrology office for review   Anemia 12/29/2016   Biventricular ICD (implantable cardioverter-defibrillator) in place    CHF (congestive heart failure) ([place])    CKD (chronic kidney disease), stage IV ([place])    Colitis 09/11/2012   Complete heart block ([place])  Essential hypertension    Hypomagnesemia 04/10/2016   Hyponatremia 04/10/2016   Last Assessment & Plan:  - improved after tolvaptan 6/29, chiefly driven by CHF, further improvement with ongoing IV Lasix - continue low Na and fluid restriction - compliance with sodium restriction a major barrier to keeping patient out of hospital and compensated, remains resistant to this philosophy while inpatient - follows with Dr Karen Chafe as outpatient, Neph consult not indicated at this ti   Localized macular rash 08/11/2016   Last Assessment & Plan:  Seems to trend with heart failure, though she has more findings behind bilateral knees and up into her thigh/groin area than when I last saw her Would use steroid cream for now,  consider alternative diagnosis if no improvement   Nonischemic cardiomyopathy ([place])    On amiodarone therapy 08/21/2017   Persistent atrial fibrillation ([place])    AV node ablation 2019   Restless leg syndrome 03/22/2019   Type 2 diabetes mellitus ([place])     ROS:   All systems reviewed and negative except as noted in the HPI.   Past Surgical History:  Procedure Laterality Date   ABLATION     AV NODE ABLATION N/A 08/08/2017   Procedure: AV NODE ABLATION;  Surgeon: Thompson Grayer, MD;  Location: [place] [facility];  Service: Cardiovascular;  Laterality: N/A;   BREAST BIOPSY     CARDIAC DEFIBRILLATOR PLACEMENT  2005   MDT Viva XT CRT-D BiV ICD implanted by Dr Rosaland Lao in [place] Bilateral    [place] CATH AND CORONARY ANGIOGRAPHY N/A 12/17/2020   Procedure: RIGHT HEART CATH AND CORONARY ANGIOGRAPHY;  Surgeon: Larey Dresser, MD;  Location: [place] [facility];  Service: Cardiovascular;  Laterality: N/A;   TEE WITHOUT CARDIOVERSION N/A 12/17/2020   Procedure: TRANSESOPHAGEAL ECHOCARDIOGRAM (TEE);  Surgeon: Larey Dresser, MD;  Location: [facility] ENDOSCOPY;  Service: Cardiovascular;  Laterality: N/A;   THYROIDECTOMY     TOTAL VAGINAL HYSTERECTOMY       Family History  Problem Relation Age of Onset   Hip fracture Mother    COPD Father    CAD Sister    Heart attack Sister      Social History   Socioeconomic History   Marital status: Married    Spouse name: Not on file   Number of children: Not on file   Years of education: Not on file   Highest education level: Not on file  Occupational History   Not on file  Tobacco Use   Smoking status: Never   Smokeless tobacco: Never  Vaping Use   Vaping Use: Never used  Substance and Sexual Activity   Alcohol use: No   Drug use: No   Sexual activity: Not on file  Other Topics Concern   Not on file  Social History Narrative   Not on file   Social Determinants of Health    Financial Resource Strain: Not on file  Food Insecurity: Not on file  Transportation Needs: Not on file  Physical Activity: Not on file  Stress: Not on file  Social Connections: Not on file  Intimate Partner Violence: Not on file     BP 124/78    Pulse 84    Ht 5\' 2"  (1.575 m)    Wt 133 lb (60.3 kg)    SpO2 98%    BMI 24.33 kg/m   Physical Exam:  Well appearing NAD HEENT: Unremarkable Neck:  No  JVD, no thyromegally Lymphatics:  No adenopathy Back:  No CVA tenderness Lungs:  Clear with no wheezes HEART:  Regular rate rhythm, no murmurs, no rubs, no clicks Abd:  soft, positive bowel sounds, no organomegally, no rebound, no guarding Ext:  2 plus pulses, no edema, no cyanosis, no clubbing Skin:  No rashes no nodules Neuro:  CN II through XII intact, motor grossly intact   DEVICE  Normal device function.  See PaceArt for details.   Assess/Plan:  1. Atrial fib - her rates are well controlled. No change in meds. 2. Biv ICD - her medtronic device is working normally. She has reached ERI. We will plan ICD gen change out. 3. Chronic systolic heart failure - her symptoms are class 2A. I encouraged her to avoid salty foods. 4. VT - she has not had any since her last check.  Carleene Overlie [place],MD

## 2021-04-27 NOTE — Progress Notes (Signed)
HPI [name] returns today for followup. She is a pleasant 71 yo woman with a h/o both atrial and ventricular arrhythmias. She has undergone AV node ablation. She has persistent atrial fib. She has a h/o dietary indiscretion and has undergone adjustments of her meds and dietary change. She has managed to avoid the hospital in the last year. She has reached ERI on her device.  Allergies  Allergen Reactions   Amiodarone Rash   Codeine Other (See Comments)    Euphoria, hallucinations   Other Palpitations    Other reaction(s): Other (See Comments) PT STATES SHE GETS REALLY HOT , DIZZY HEADED, HAS PASSED OUT TWICE , AND ITCHING WITH IV DYE Tumeric   Colchicine Rash   Iodine Rash    topical     Current Outpatient Medications  Medication Sig Dispense Refill   acetaminophen (TYLENOL) 500 MG tablet Take 1,000 mg by mouth every 6 (six) hours as needed for mild pain.     allopurinol (ZYLOPRIM) 100 MG tablet Take 100-200 mg by mouth See admin instructions. Take 200 mg in the morning 100 mg at bedtime     Cinnamon 500 MG capsule Take 500 mg by mouth daily.     ENTRESTO 24-26 MG TAKE (1) TABLET TWICE DAILY. 60 tablet 0   furosemide (LASIX) 80 MG tablet Take 1 tablet (80 mg total) by mouth every morning. 90 tablet 3   Glucosamine-Chondroitin (OSTEO BI-FLEX REGULAR STRENGTH PO) Take 1 tablet by mouth daily.     JARDIANCE 10 MG TABS tablet TAKE 1 TABLET ONCE A DAY BEFORE BREAKFAST. 30 tablet 11   levothyroxine (SYNTHROID) 88 MCG tablet Take 1 tablet (88 mcg total) by mouth daily before breakfast. 30 tablet 11   loratadine (CLARITIN) 10 MG tablet Take 10 mg by mouth daily.     metoprolol succinate (TOPROL-XL) 100 MG 24 hr tablet Take 1 tablet (100 mg total) by mouth every morning. . 90 tablet 5   metoprolol succinate (TOPROL-XL) 50 MG 24 hr tablet Take 1 tablet (50 mg total) by mouth every evening. Take with or immediately following a meal. 90 tablet 3   Omega-3 Fatty Acids (FISH OIL) 1200  MG CPDR Take 1,200 mg by mouth daily.     omeprazole (PRILOSEC) 40 MG capsule TAKE 1 CAPSULE BY MOUTH ONCE DAILY. 90 capsule 3   simvastatin (ZOCOR) 20 MG tablet TAKE 1 TABLET EVERY DAY AT 6 PM 90 tablet 3   TOUJEO SOLOSTAR 300 UNIT/ML Solostar Pen INJECT 24 UNITS INTO THE SKIN EVERY DAY 4.5 mL 3   warfarin (COUMADIN) 2 MG tablet TAKE 1 TO 2 TABLETS ONCE A DAY AS DIRECTED BY ANTICOAGULATION. 40 tablet 5   No current facility-administered medications for this visit.     Past Medical History:  Diagnosis Date   Acute pain of right shoulder 03/22/2019   Acute renal failure ([place]) 04/15/2012   Last Assessment & Plan:  Superimposed on CK D stage III, Baseline Cr ~2-2.5, remains above baseline, felt to be due to Cardiorenal syndrome, creatinine trending down at time of discharge Negative urine eos and no urinary retention ACE inhibitor discontinued Follows with Dr Karen Chafe as outpatient, to have labs drawn 08/15/16 and results sent to [facility]rology office for review   Anemia 12/29/2016   Biventricular ICD (implantable cardioverter-defibrillator) in place    CHF (congestive heart failure) ([place])    CKD (chronic kidney disease), stage IV ([place])    Colitis 09/11/2012   Complete heart block ([place])  Essential hypertension    Hypomagnesemia 04/10/2016   Hyponatremia 04/10/2016   Last Assessment & Plan:  - improved after tolvaptan 6/29, chiefly driven by CHF, further improvement with ongoing IV Lasix - continue low Na and fluid restriction - compliance with sodium restriction a major barrier to keeping patient out of hospital and compensated, remains resistant to this philosophy while inpatient - follows with Dr Karen Chafe as outpatient, Neph consult not indicated at this ti   Localized macular rash 08/11/2016   Last Assessment & Plan:  Seems to trend with heart failure, though she has more findings behind bilateral knees and up into her thigh/groin area than when I last saw her Would use steroid cream for now,  consider alternative diagnosis if no improvement   Nonischemic cardiomyopathy ([place])    On amiodarone therapy 08/21/2017   Persistent atrial fibrillation ([place])    AV node ablation 2019   Restless leg syndrome 03/22/2019   Type 2 diabetes mellitus ([place])     ROS:   All systems reviewed and negative except as noted in the HPI.   Past Surgical History:  Procedure Laterality Date   ABLATION     AV NODE ABLATION N/A 08/08/2017   Procedure: AV NODE ABLATION;  Surgeon: Thompson Grayer, MD;  Location: [place] [facility] LAB;  Service: Cardiovascular;  Laterality: N/A;   BREAST BIOPSY     CARDIAC DEFIBRILLATOR PLACEMENT  2005   MDT Viva XT CRT-D BiV ICD implanted by Dr Rosaland Lao in [place] Bilateral    [place] CATH AND CORONARY ANGIOGRAPHY N/A 12/17/2020   Procedure: RIGHT HEART CATH AND CORONARY ANGIOGRAPHY;  Surgeon: Larey Dresser, MD;  Location: [place] [facility] LAB;  Service: Cardiovascular;  Laterality: N/A;   TEE WITHOUT CARDIOVERSION N/A 12/17/2020   Procedure: TRANSESOPHAGEAL ECHOCARDIOGRAM (TEE);  Surgeon: Larey Dresser, MD;  Location: [facility] ENDOSCOPY;  Service: Cardiovascular;  Laterality: N/A;   THYROIDECTOMY     TOTAL VAGINAL HYSTERECTOMY       Family History  Problem Relation Age of Onset   Hip fracture Mother    COPD Father    CAD Sister    Heart attack Sister      Social History   Socioeconomic History   Marital status: Married    Spouse name: Not on file   Number of children: Not on file   Years of education: Not on file   Highest education level: Not on file  Occupational History   Not on file  Tobacco Use   Smoking status: Never   Smokeless tobacco: Never  Vaping Use   Vaping Use: Never used  Substance and Sexual Activity   Alcohol use: No   Drug use: No   Sexual activity: Not on file  Other Topics Concern   Not on file  Social History Narrative   Not on file   Social Determinants of Health    Financial Resource Strain: Not on file  Food Insecurity: Not on file  Transportation Needs: Not on file  Physical Activity: Not on file  Stress: Not on file  Social Connections: Not on file  Intimate Partner Violence: Not on file     BP 124/78    Pulse 84    Ht 5\' 2"  (1.575 m)    Wt 133 lb (60.3 kg)    SpO2 98%    BMI 24.33 kg/m   Physical Exam:  Well appearing NAD HEENT: Unremarkable Neck:  No  JVD, no thyromegally Lymphatics:  No adenopathy Back:  No CVA tenderness Lungs:  Clear with no wheezes HEART:  Regular rate rhythm, no murmurs, no rubs, no clicks Abd:  soft, positive bowel sounds, no organomegally, no rebound, no guarding Ext:  2 plus pulses, no edema, no cyanosis, no clubbing Skin:  No rashes no nodules Neuro:  CN II through XII intact, motor grossly intact   DEVICE  Normal device function.  See PaceArt for details.   Assess/Plan:  1. Atrial fib - her rates are well controlled. No change in meds. 2. Biv ICD - her medtronic device is working normally. She has reached ERI. We will plan ICD gen change out. 3. Chronic systolic heart failure - her symptoms are class 2A. I encouraged her to avoid salty foods. 4. VT - she has not had any since her last check.  Carleene Overlie [place],MD

## 2021-04-27 NOTE — Patient Instructions (Signed)
Medication Instructions:  Hold Coumadin 2 day prior to procedure  Your physician recommends that you continue on your current medications as directed. Please refer to the Current Medication list given to you today.  *If you need a refill on your cardiac medications before your next appointment, please call your pharmacy*   Lab Work: Your physician recommends that you return for lab work in: Today   If you have labs (blood work) drawn today and your tests are completely normal, you will receive your results only by: MyChart Message (if you have MyChart) OR A paper copy in the mail If you have any lab test that is abnormal or we need to change your treatment, we will call you to review the results.   Testing/Procedures:    Follow-Up: At [facility], you and your health needs are our priority.  As part of our continuing mission to provide you with exceptional heart care, we have created designated Provider Care Teams.  These Care Teams include your primary Cardiologist (physician) and Advanced Practice Providers (APPs -  Physician Assistants and Nurse Practitioners) who all work together to provide you with the care you need, when you need it.  We recommend signing up for the patient portal called "MyChart".  Sign up information is provided on this After Visit Summary.  MyChart is used to connect with patients for Virtual Visits (Telemedicine).  Patients are able to view lab/test results, encounter notes, upcoming appointments, etc.  Non-urgent messages can be sent to your provider as well.   To learn more about what you can do with MyChart, go to [email or web address].    Your next appointment:   3 month(s)  The format for your next appointment:   In Person  Provider:   Cristopher Peru, MD    Other Instructions Thank you for choosing [facility]!

## 2021-05-07 NOTE — Pre-Procedure Instructions (Signed)
Instructed patient on the following items: ?Arrival time 0930 ?Nothing to eat or drink after midnight ?No meds AM of procedure ?Responsible person to drive you home and stay with you for 24 hrs ?Wash with special soap night before and morning of procedure ?If on anti-coagulant drug instructions Coumadin- last dose tonight  ?

## 2021-05-09 LAB — CUP PACEART REMOTE DEVICE CHECK
Battery Remaining Longevity: 1 mo
Battery Voltage: 2.71 V
Brady Statistic RA Percent Paced: 0.01 %
Brady Statistic RV Percent Paced: 89.26 %
Date Time Interrogation Session: 20230312033623
HighPow Impedance: 58 Ohm
Implantable Pulse Generator Implant Date: 20160413
Lead Channel Impedance Value: 342 Ohm
Lead Channel Impedance Value: 399 Ohm
Lead Channel Impedance Value: 399 Ohm
Lead Channel Impedance Value: 551 Ohm
Lead Channel Impedance Value: 589 Ohm
Lead Channel Impedance Value: 836 Ohm
Lead Channel Pacing Threshold Amplitude: 0.75 V
Lead Channel Pacing Threshold Amplitude: 0.875 V
Lead Channel Pacing Threshold Amplitude: 1.375 V
Lead Channel Pacing Threshold Pulse Width: 0.4 ms
Lead Channel Pacing Threshold Pulse Width: 0.4 ms
Lead Channel Pacing Threshold Pulse Width: 0.4 ms
Lead Channel Sensing Intrinsic Amplitude: 0.625 mV
Lead Channel Sensing Intrinsic Amplitude: 0.625 mV
Lead Channel Sensing Intrinsic Amplitude: 3.5 mV
Lead Channel Sensing Intrinsic Amplitude: 3.5 mV
Lead Channel Setting Pacing Amplitude: 2 V
Lead Channel Setting Pacing Amplitude: 2.25 V
Lead Channel Setting Pacing Amplitude: 2.75 V
Lead Channel Setting Pacing Pulse Width: 0.4 ms
Lead Channel Setting Pacing Pulse Width: 0.4 ms
Lead Channel Setting Sensing Sensitivity: 0.3 mV

## 2021-05-10 ENCOUNTER — Ambulatory Visit (HOSPITAL_COMMUNITY)
Admission: RE | Disposition: A | Discharge status: Home / Self Care | Payer: Self-pay | Source: Home / Self Care | Attending: Internal Medicine

## 2021-05-10 ENCOUNTER — Encounter (HOSPITAL_COMMUNITY): Payer: Self-pay | Admitting: Internal Medicine

## 2021-05-10 ENCOUNTER — Ambulatory Visit (HOSPITAL_COMMUNITY)
Admission: RE | Admit: 2021-05-10 | Discharge: 2021-05-10 | Disposition: A | Discharge status: Home / Self Care | Payer: PPO | Attending: Internal Medicine | Admitting: Internal Medicine

## 2021-05-10 ENCOUNTER — Other Ambulatory Visit: Payer: Self-pay

## 2021-05-10 ENCOUNTER — Ambulatory Visit: Payer: PPO

## 2021-05-10 DIAGNOSIS — I4819 Other persistent atrial fibrillation: Secondary | ICD-10-CM | POA: Diagnosis not present

## 2021-05-10 DIAGNOSIS — Z79890 Hormone replacement therapy (postmenopausal): Secondary | ICD-10-CM | POA: Insufficient documentation

## 2021-05-10 DIAGNOSIS — I5022 Chronic systolic (congestive) heart failure: Secondary | ICD-10-CM | POA: Insufficient documentation

## 2021-05-10 DIAGNOSIS — N184 Chronic kidney disease, stage 4 (severe): Secondary | ICD-10-CM | POA: Insufficient documentation

## 2021-05-10 DIAGNOSIS — Z79899 Other long term (current) drug therapy: Secondary | ICD-10-CM | POA: Diagnosis not present

## 2021-05-10 DIAGNOSIS — Z7901 Long term (current) use of anticoagulants: Secondary | ICD-10-CM | POA: Insufficient documentation

## 2021-05-10 DIAGNOSIS — I130 Hypertensive heart and chronic kidney disease with heart failure and stage 1 through stage 4 chronic kidney disease, or unspecified chronic kidney disease: Secondary | ICD-10-CM | POA: Diagnosis not present

## 2021-05-10 DIAGNOSIS — I442 Atrioventricular block, complete: Secondary | ICD-10-CM | POA: Insufficient documentation

## 2021-05-10 DIAGNOSIS — Z7984 Long term (current) use of oral hypoglycemic drugs: Secondary | ICD-10-CM | POA: Insufficient documentation

## 2021-05-10 DIAGNOSIS — Z794 Long term (current) use of insulin: Secondary | ICD-10-CM | POA: Diagnosis not present

## 2021-05-10 DIAGNOSIS — Z4502 Encounter for adjustment and management of automatic implantable cardiac defibrillator: Secondary | ICD-10-CM | POA: Insufficient documentation

## 2021-05-10 DIAGNOSIS — E1122 Type 2 diabetes mellitus with diabetic chronic kidney disease: Secondary | ICD-10-CM | POA: Diagnosis not present

## 2021-05-10 DIAGNOSIS — I472 Ventricular tachycardia, unspecified: Secondary | ICD-10-CM | POA: Insufficient documentation

## 2021-05-10 DIAGNOSIS — I428 Other cardiomyopathies: Secondary | ICD-10-CM | POA: Insufficient documentation

## 2021-05-10 HISTORY — PX: ICD GENERATOR CHANGEOUT: EP1231

## 2021-05-10 LAB — GLUCOSE, CAPILLARY: Glucose-Capillary: 171 mg/dL — ABNORMAL HIGH (ref 70–99)

## 2021-05-10 LAB — PROTIME-INR
INR: 1.9 — ABNORMAL HIGH (ref 0.8–1.2)
Prothrombin Time: 21.3 seconds — ABNORMAL HIGH (ref 11.4–15.2)

## 2021-05-10 SURGERY — ICD GENERATOR CHANGEOUT

## 2021-05-10 MED ORDER — FENTANYL CITRATE (PF) 100 MCG/2ML IJ SOLN
INTRAMUSCULAR | Status: AC
  Filled 2021-05-10: qty 2

## 2021-05-10 MED ORDER — ACETAMINOPHEN 325 MG PO TABS: 325.0000 mg | ORAL_TABLET | ORAL | Status: DC | PRN

## 2021-05-10 MED ORDER — CHLORHEXIDINE GLUCONATE 4 % EX LIQD
4.0000 | Freq: Once | CUTANEOUS | Status: DC
Start: 2021-05-10 — End: 2021-05-10
  Filled 2021-05-10: qty 60

## 2021-05-10 MED ORDER — ONDANSETRON HCL 4 MG/2ML IJ SOLN: 4.0000 mg | Freq: Four times a day (QID) | INTRAMUSCULAR | Status: DC | PRN

## 2021-05-10 MED ORDER — SODIUM CHLORIDE 0.9 % IV SOLN: INTRAVENOUS | Status: DC

## 2021-05-10 MED ORDER — SODIUM CHLORIDE 0.9 % IV SOLN
INTRAVENOUS | Status: AC
  Filled 2021-05-10: qty 2

## 2021-05-10 MED ORDER — SODIUM CHLORIDE 0.9 % IV SOLN
80.0000 mg | INTRAVENOUS | Status: AC
  Administered 2021-05-10: 80 mg

## 2021-05-10 MED ORDER — MIDAZOLAM HCL 5 MG/5ML IJ SOLN
INTRAMUSCULAR | Status: DC | PRN
  Administered 2021-05-10 (×2): 2 mg via INTRAVENOUS
  Administered 2021-05-10: 1 mg via INTRAVENOUS

## 2021-05-10 MED ORDER — FENTANYL CITRATE (PF) 100 MCG/2ML IJ SOLN
INTRAMUSCULAR | Status: DC | PRN
  Administered 2021-05-10: 25 ug via INTRAVENOUS
  Administered 2021-05-10: 12.5 ug via INTRAVENOUS
  Administered 2021-05-10: 25 ug via INTRAVENOUS

## 2021-05-10 MED ORDER — LIDOCAINE HCL 1 % IJ SOLN
INTRAMUSCULAR | Status: AC
  Filled 2021-05-10: qty 60

## 2021-05-10 MED ORDER — MIDAZOLAM HCL 5 MG/5ML IJ SOLN
INTRAMUSCULAR | Status: AC
  Filled 2021-05-10: qty 5

## 2021-05-10 MED ORDER — ONDANSETRON HCL 4 MG/2ML IJ SOLN
INTRAMUSCULAR | Status: DC | PRN
  Administered 2021-05-10: 2 mg via INTRAVENOUS

## 2021-05-10 MED ORDER — ONDANSETRON HCL 4 MG/2ML IJ SOLN
INTRAMUSCULAR | Status: AC
  Filled 2021-05-10: qty 2

## 2021-05-10 MED ORDER — CEFAZOLIN SODIUM-DEXTROSE 2-4 GM/100ML-% IV SOLN
INTRAVENOUS | Status: AC
  Filled 2021-05-10: qty 100

## 2021-05-10 MED ORDER — CEFAZOLIN SODIUM-DEXTROSE 2-4 GM/100ML-% IV SOLN
2.0000 g | INTRAVENOUS | Status: AC
  Administered 2021-05-10: 2 g via INTRAVENOUS

## 2021-05-10 SURGICAL SUPPLY — 4 items
CABLE SURGICAL S-101-97-12 (CABLE) ×3 IMPLANT
ICD CLARIA MRI DTMA1D1 (ICD Generator) ×2 IMPLANT
PAD DEFIB RADIO PHYSIO CONN (PAD) ×3 IMPLANT
TRAY PACEMAKER INSERTION (PACKS) ×3 IMPLANT

## 2021-05-10 NOTE — Discharge Instructions (Signed)

## 2021-05-10 NOTE — Interval H&P Note (Signed)
History and Physical Interval Note: ? ?05/10/2021 ?12:47 PM ? ?Emily [name]  has presented today for surgery, with the diagnosis of eri.  The various methods of treatment have been discussed with the patient and family. After consideration of risks, benefits and other options for treatment, the patient has consented to  Procedure(s): ?ICD [place] (N/A) as a surgical intervention.  The patient's history has been reviewed, patient examined, no change in status, stable for surgery.  I have reviewed the patient's chart and labs.  Questions were answered to the patient's satisfaction.   ? ? ?Cristopher Peru ? ? ?

## 2021-05-11 MED FILL — Lidocaine HCl Local Preservative Free (PF) Inj 1%: INTRAMUSCULAR | Qty: 30 | Status: AC

## 2021-05-12 ENCOUNTER — Other Ambulatory Visit: Payer: Self-pay | Admitting: Cardiology

## 2021-05-13 ENCOUNTER — Other Ambulatory Visit: Payer: Self-pay | Admitting: Internal Medicine

## 2021-05-17 ENCOUNTER — Telehealth (HOSPITAL_COMMUNITY): Payer: Self-pay | Admitting: *Deleted

## 2021-05-17 NOTE — Telephone Encounter (Signed)
Pt called stating evening metoprolol is giving her terrible nightmares and causing her not to sleep. Pt said she stopped the PM metoprolol and the nightmares stopped. Pt asked if she should increase her morning dose or if there was another medication she could take . ? ?Routed to [place]  ?

## 2021-05-17 NOTE — Telephone Encounter (Signed)
Spoke with pt she is aware and agreeable.  

## 2021-05-17 NOTE — Telephone Encounter (Signed)
She can try taking 150 mg all at once in the morning.  ?

## 2021-05-20 ENCOUNTER — Ambulatory Visit (INDEPENDENT_AMBULATORY_CARE_PROVIDER_SITE_OTHER): Payer: PPO

## 2021-05-20 ENCOUNTER — Other Ambulatory Visit: Payer: Self-pay

## 2021-05-20 DIAGNOSIS — I4821 Permanent atrial fibrillation: Secondary | ICD-10-CM | POA: Diagnosis not present

## 2021-05-20 DIAGNOSIS — Z95810 Presence of automatic (implantable) cardiac defibrillator: Secondary | ICD-10-CM | POA: Diagnosis not present

## 2021-05-20 LAB — CUP PACEART INCLINIC DEVICE CHECK
Battery Remaining Longevity: 108 mo
Battery Voltage: 3.14 V
Brady Statistic AP VP Percent: 0 %
Brady Statistic AP VS Percent: 0 %
Brady Statistic AS VP Percent: 0 %
Brady Statistic AS VS Percent: 0 %
Brady Statistic RA Percent Paced: 0 %
Brady Statistic RV Percent Paced: 98.29 %
Date Time Interrogation Session: 20230323170401
HighPow Impedance: 63 Ohm
Implantable Lead Implant Date: 20050218
Implantable Lead Implant Date: 20050218
Implantable Lead Implant Date: 20050218
Implantable Lead Location: 753858
Implantable Lead Location: 753859
Implantable Lead Location: 753860
Implantable Lead Model: 4194
Implantable Lead Model: 5076
Implantable Lead Model: 6947
Implantable Pulse Generator Implant Date: 20230313
Lead Channel Impedance Value: 380 Ohm
Lead Channel Impedance Value: 399 Ohm
Lead Channel Impedance Value: 399 Ohm
Lead Channel Impedance Value: 589 Ohm
Lead Channel Impedance Value: 665 Ohm
Lead Channel Impedance Value: 912 Ohm
Lead Channel Pacing Threshold Amplitude: 1.25 V
Lead Channel Pacing Threshold Amplitude: 1.375 V
Lead Channel Pacing Threshold Pulse Width: 0.4 ms
Lead Channel Pacing Threshold Pulse Width: 0.4 ms
Lead Channel Sensing Intrinsic Amplitude: 1 mV
Lead Channel Sensing Intrinsic Amplitude: 4.625 mV
Lead Channel Sensing Intrinsic Amplitude: 4.625 mV
Lead Channel Setting Pacing Amplitude: 2.25 V
Lead Channel Setting Pacing Amplitude: 2.5 V
Lead Channel Setting Pacing Pulse Width: 0.4 ms
Lead Channel Setting Pacing Pulse Width: 0.4 ms
Lead Channel Setting Sensing Sensitivity: 0.3 mV

## 2021-05-20 NOTE — Progress Notes (Signed)
Wound check appointment. Steri-strips removed. Wound without redness or edema. Incision edges approximated with exception of distal end of incision. Steri-strips reapplied to distal end. wound recheck scheduled 05/27/21. Wound care education completed. Normal device function. Thresholds, sensing, and impedances consistent with implant measurements. Device programmed at 3.5V for extra safety margin until 3 month visit. Histogram distribution appropriate for patient and level of activity. No mode switches or ventricular arrhythmias noted. Patient educated about wound care, arm mobility, shock plan. ROV in 3 months with implanting physician. ?

## 2021-05-20 NOTE — Patient Instructions (Addendum)
? ?  After Your ICD ?(Implantable Cardiac Defibrillator) ? ? ? ?Monitor your defibrillator site for redness, swelling, and drainage. Call the device clinic at [phone] if you experience these symptoms or fever/chills. ? ?Your incision was closed with steri-strips. Please do not wet the incision until we have checked you wound on 05/27/21. ? ?You may use a hot tub or a pool after your wound check appointment if the incision is completely closed. ? ?Your ICD is designed to protect you from life threatening heart rhythms. Because of this, you may receive a shock.  ? ?1 shock with no symptoms:  Call the office during business hours. ?1 shock with symptoms (chest pain, chest pressure, dizziness, lightheadedness, shortness of breath, overall feeling unwell):  Call 911. ?If you experience 2 or more shocks in 24 hours:  Call 911. ?If you receive a shock, you should not drive.  ?[place] DMV - no driving for 6 months if you receive appropriate therapy from your ICD.  ? ?ICD Alerts:  Some alerts are vibratory and others beep. These are NOT emergencies. Please call our office to let us know. If this occurs at night or on weekends, it can wait until the next business day. Send a remote transmission. ? ?If your device is capable of reading fluid status (for heart failure), you will be offered monthly monitoring to review this with you.  ? ?Remote monitoring is used to monitor your ICD from home. This monitoring is scheduled every 91 days by our office. It allows [place] to keep an eye on the functioning of your device to ensure it is working properly. You will routinely see your Electrophysiologist annually (more often if necessary).  ?

## 2021-05-27 ENCOUNTER — Ambulatory Visit (INDEPENDENT_AMBULATORY_CARE_PROVIDER_SITE_OTHER): Payer: PPO

## 2021-05-27 DIAGNOSIS — I420 Dilated cardiomyopathy: Secondary | ICD-10-CM

## 2021-05-27 NOTE — Progress Notes (Signed)
Patient seen in DC today for wound recheck. No redness, swelling, bleeding, or drainage noted to incision. Wound care education provided to patient with verbal understanding.  ?

## 2021-05-27 NOTE — Addendum Note (Signed)
Addended by: Roma Kayser T on: 05/27/2021 10:36 AM ? ? Modules accepted: Level of Service ? ?

## 2021-05-27 NOTE — Patient Instructions (Signed)
? ?  After Your ICD ?(Implantable Cardiac Defibrillator) ? ? ? ?Monitor your defibrillator site for redness, swelling, and drainage. Call the device clinic at [phone] if you experience these symptoms or fever/chills. ? ?Your incision was closed with Steri-strips or staples:  You may shower 7 days after your procedure and wash your incision with soap and water. Avoid lotions, ointments, or perfumes over your incision until it is well-healed. ? ? ?

## 2021-05-31 ENCOUNTER — Ambulatory Visit (INDEPENDENT_AMBULATORY_CARE_PROVIDER_SITE_OTHER): Payer: PPO | Admitting: *Deleted

## 2021-05-31 DIAGNOSIS — Z5181 Encounter for therapeutic drug level monitoring: Secondary | ICD-10-CM | POA: Diagnosis not present

## 2021-05-31 DIAGNOSIS — I4891 Unspecified atrial fibrillation: Secondary | ICD-10-CM

## 2021-05-31 DIAGNOSIS — I513 Intracardiac thrombosis, not elsewhere classified: Secondary | ICD-10-CM

## 2021-05-31 LAB — POCT INR: INR: 2.2 (ref 2.0–3.0)

## 2021-05-31 NOTE — Patient Instructions (Signed)
Continue warfarin 1 tablet daily except 2 tablets on Mondays and Thursdays.  ?Recheck in 6 weeks ?-Call [phone] if started on any new medications.  ?

## 2021-06-16 ENCOUNTER — Other Ambulatory Visit (HOSPITAL_COMMUNITY): Payer: Self-pay | Admitting: Cardiology

## 2021-06-29 ENCOUNTER — Ambulatory Visit (INDEPENDENT_AMBULATORY_CARE_PROVIDER_SITE_OTHER): Payer: PPO

## 2021-06-29 DIAGNOSIS — Z95810 Presence of automatic (implantable) cardiac defibrillator: Secondary | ICD-10-CM | POA: Diagnosis not present

## 2021-06-29 DIAGNOSIS — I5022 Chronic systolic (congestive) heart failure: Secondary | ICD-10-CM

## 2021-07-01 ENCOUNTER — Other Ambulatory Visit (HOSPITAL_COMMUNITY): Payer: Self-pay

## 2021-07-01 ENCOUNTER — Encounter: Payer: Self-pay | Admitting: Family Medicine

## 2021-07-01 ENCOUNTER — Ambulatory Visit (INDEPENDENT_AMBULATORY_CARE_PROVIDER_SITE_OTHER): Payer: PPO | Admitting: Family Medicine

## 2021-07-01 VITALS — BP 144/60 | HR 74 | Ht 62.0 in | Wt 135.4 lb

## 2021-07-01 DIAGNOSIS — Z794 Long term (current) use of insulin: Secondary | ICD-10-CM | POA: Diagnosis not present

## 2021-07-01 DIAGNOSIS — N184 Chronic kidney disease, stage 4 (severe): Secondary | ICD-10-CM

## 2021-07-01 DIAGNOSIS — E1122 Type 2 diabetes mellitus with diabetic chronic kidney disease: Secondary | ICD-10-CM

## 2021-07-01 DIAGNOSIS — M722 Plantar fascial fibromatosis: Secondary | ICD-10-CM

## 2021-07-01 LAB — POCT GLYCOSYLATED HEMOGLOBIN (HGB A1C): HbA1c, POC (controlled diabetic range): 9.9 % — AB (ref 0.0–7.0)

## 2021-07-01 MED ORDER — DICLOFENAC SODIUM 1 % EX GEL: 4.0000 g | Freq: Four times a day (QID) | CUTANEOUS | 1 refills | Status: DC

## 2021-07-01 NOTE — Patient Instructions (Addendum)
It was wonderful to see you today. ? ?Please bring ALL of your medications with you to every visit.  ? ?Today we talked about: ? ?- [facility] or another eye doctor for a diabetic eye exam- we recommend this yearly ?- I would increase your Troujeo to 26 units and check your sugar in the morning, every time BG is > 160 can increase another 2 units ?- Use the voltaren gel up to 4 times a day on your feet for your plantar fascitis and do your exercises (attached) ? ?-you can go to the pharmacy to get your Tetanus booster(Tdap) ? ? ?Thank you for choosing [facility].  ? ?Please call [phone] with any questions about today's appointment. ? ?Please be sure to schedule follow up at the front  desk before you leave today.  ? ?Please arrive at least 15 minutes prior to your scheduled appointments. ?  ?If you had blood work today, I will send you a MyChart message or a letter if results are normal. Otherwise, I will give you a call. ?  ?If you had a referral placed, they will call you to set up an appointment. Please give [place] a call if you don't hear back in the next 2 weeks. ?  ?If you need additional refills before your next appointment, please call your pharmacy first.  ? ?Yehuda Savannah, MD  ?Family Medicine   ?

## 2021-07-01 NOTE — Assessment & Plan Note (Signed)
-   not meeting criteria for X-ray, no skin breakdown, due to her CHF and CKD unable to use NSAIDs, discussed with poor diabetes control I would not recommend steroids further as she just completed 3 day course, will use Volatern gel, discsused apporpriate footwear as she is wearing sandals today, and given exercises to do ?- consider injection if conservative measures not improving pain control ?

## 2021-07-01 NOTE — Assessment & Plan Note (Signed)
-   A1c not at goal, discussed uptitration of Toujeo by 2 units if FPG >160, she is reluctant due to cost, continue home Jardiance, will check with pharmacy about medication assistance for her medications ?

## 2021-07-01 NOTE — Progress Notes (Signed)
? ? ?  SUBJECTIVE:  ? ?CHIEF COMPLAINT / HPI:  ? ?Feet problems- diagnosed with plantar fascitis, wearing sandals today. Today is finishing a three day course of prednisone. Does exercises at home. Tried both heat and ice. Has tried an injection before which was not helpful. ? ?T2DM- taking Jardiance 10mg  daily, Toujeo 24 units daily. Sometimes feels dizzy if blood sugar is in the 90s or 100s, this only happens once every few months. A1c is 9.9 today. Has not had an eye exam in several years. ? ?PERTINENT  PMH / PSH: HTN, cardiomyopathy s/p ICD, HFrEF, Afib, CAD, GERD, T2DM, hypothyroidism, CKD 4, HLD ? ?OBJECTIVE:  ? ?BP (!) 144/60   Pulse 74   Ht 5\' 2"  (1.575 m)   Wt 135 lb 6.4 oz (61.4 kg)   SpO2 100%   BMI 24.76 kg/m?   ?General: A&O, NAD ?HEENT: No sign of trauma, EOM grossly intact ?Cardiac: RRR, no m/r/g ?Respiratory: CTAB, normal WOB, no w/c/r ?GI: Soft, NTTP, non-distended  ?Extremities: NTTP, no peripheral edema. ?Neuro: Normal gait, moves all four extremities appropriately. ?Psych: Appropriate mood and affect ? ?Feet: bilateral plantar surfaces at heel TTP, no Achilles tender TTP, no tenderness of medial or lateral malleoli, no tenderness of base of fifth metatarsal, dorsalis pedis pulses 2+ bilaterally, no skin breakdown or ulcers or callouses appreciated, no hammer toes, nails without onchomycosis or thickness, no erythema or warmth of joints ? ? ?ASSESSMENT/PLAN:  ? ?Plantar fasciitis, bilateral ?- not meeting criteria for X-ray, no skin breakdown, due to her CHF and CKD unable to use NSAIDs, discussed with poor diabetes control I would not recommend steroids further as she just completed 3 day course, will use Volatern gel, discsused apporpriate footwear as she is wearing sandals today, and given exercises to do ?- consider injection if conservative measures not improving pain control ? ?Long-term insulin use in type 2 diabetes ([place]) ?- A1c not at goal, discussed uptitration of Toujeo by 2 units  if FPG >160, she is reluctant due to cost, continue home Jardiance, will check with pharmacy about medication assistance for her medications ?  ?Declined pneumonia shot. Discussed Tdap at pharmacy. ?Recommended yearly eye exam. ? ?Lenoria Chime, MD ?[place]  ? ?

## 2021-07-02 ENCOUNTER — Telehealth: Payer: Self-pay

## 2021-07-02 NOTE — Progress Notes (Signed)
EPIC Encounter for ICM Monitoring ? ?Patient Name: Emily [name] is a 71 y.o. female ?Date: 07/02/2021 ?Primary Care Physican: Ezequiel Essex, MD ?Primary Cardiologist: [facility] ?Electrophysiologist: Lovena Le ?Bi-V Pacing: 95.2%  ?04/09/2021 Weight: 132 lbs ?  ?  ?      Spoke with patient and heart failure questions reviewed.  Pt asymptomatic for fluid accumulation.  She has plantar fascitis in both feet.   ?        ?Optivol Thoracic impedance suggesting normal fluid levels.  ?  ?Prescribed: ?Furosemide 80 mg Take 1 tablet (80 mg total) by mouth every morning.  She self adjusts Furosemide as needed.  ?  ?Labs: ?06/14/2021 Creatinine 1.78, BUN 23, Potassium 4.1, Sodium 142, GFR 30 ?A complete set of results can be found in Results Review. ?  ?Recommendations:  No changes and encouraged to call if experiencing any fluid symptoms. ?  ?Follow-up plan: ICM clinic phone appointment on 08/02/2021.  91 day device clinic remote transmission 08/10/2021.    ?  ?EP/Cardiology Office Visits:  07/15/2021 with Dr Aundra Dubin.     ?  ?Copy of ICM check sent to Dr. Lovena Le.  ? ?3 month ICM trend: 06/29/2021. ? ? ? ?12-14 Month ICM trend:  ? ? ? ?[name], RN ?07/02/2021 ?3:43 PM ? ?

## 2021-07-02 NOTE — Telephone Encounter (Signed)
Called & spoke to pr regarding possible assistance on medications Delene Loll, [place], and [place]).  ? ?Informed pt of applying for Low Income Subsidy/Extra help from [place]. Pt let me know her husband is not comfortable disclosing their income. Assured her that since the application is through [facility] they should already know their income. Pt agreed to give it a try. Told pt to call me back at office with any questions moving forward & understands that if shes denied help "both our hands are tied" with assistance programs that require proof of income.  ?

## 2021-07-05 ENCOUNTER — Telehealth: Payer: Self-pay

## 2021-07-05 NOTE — Telephone Encounter (Signed)
Patient calls nurse line reporting continued left foot pain.  ? ?Patient reports all the creams and exercises have not worked.  ? ?Patient advised per note the next step would be an injection. Patient reported, "those do not work for me and I told her that."  ? ?Patient reports she is unable to ambulate today due to the pain.  ? ?Apt offered, however patient reports I was just there.  ? ?Will forward to PCP.  ?

## 2021-07-13 ENCOUNTER — Ambulatory Visit: Payer: PPO | Admitting: *Deleted

## 2021-07-13 DIAGNOSIS — I513 Intracardiac thrombosis, not elsewhere classified: Secondary | ICD-10-CM | POA: Diagnosis not present

## 2021-07-13 DIAGNOSIS — Z5181 Encounter for therapeutic drug level monitoring: Secondary | ICD-10-CM | POA: Diagnosis not present

## 2021-07-13 DIAGNOSIS — I4891 Unspecified atrial fibrillation: Secondary | ICD-10-CM | POA: Diagnosis not present

## 2021-07-13 LAB — POCT INR: INR: 4.1 — AB (ref 2.0–3.0)

## 2021-07-13 NOTE — Patient Instructions (Signed)
Hold warfarin tonight then resume 1 tablet daily except 2 tablets on Mondays and Thursdays.  ?Eat extra greens today ?Recheck in 3 weeks ?-Call [phone] if started on any new medications.  ?

## 2021-07-15 ENCOUNTER — Encounter (HOSPITAL_COMMUNITY): Payer: Self-pay | Admitting: Cardiology

## 2021-07-15 ENCOUNTER — Ambulatory Visit (HOSPITAL_COMMUNITY)
Admission: RE | Admit: 2021-07-15 | Discharge: 2021-07-15 | Disposition: A | Discharge status: Home / Self Care | Payer: PPO | Source: Ambulatory Visit | Attending: Cardiology | Admitting: Cardiology

## 2021-07-15 ENCOUNTER — Other Ambulatory Visit (HOSPITAL_COMMUNITY): Payer: Self-pay | Admitting: Cardiology

## 2021-07-15 ENCOUNTER — Other Ambulatory Visit: Payer: Self-pay | Admitting: Family Medicine

## 2021-07-15 VITALS — BP 102/60 | HR 64 | Wt 137.4 lb

## 2021-07-15 DIAGNOSIS — I2510 Atherosclerotic heart disease of native coronary artery without angina pectoris: Secondary | ICD-10-CM | POA: Diagnosis not present

## 2021-07-15 DIAGNOSIS — Z79899 Other long term (current) drug therapy: Secondary | ICD-10-CM | POA: Diagnosis not present

## 2021-07-15 DIAGNOSIS — Z8679 Personal history of other diseases of the circulatory system: Secondary | ICD-10-CM | POA: Insufficient documentation

## 2021-07-15 DIAGNOSIS — I4821 Permanent atrial fibrillation: Secondary | ICD-10-CM | POA: Insufficient documentation

## 2021-07-15 DIAGNOSIS — N184 Chronic kidney disease, stage 4 (severe): Secondary | ICD-10-CM | POA: Insufficient documentation

## 2021-07-15 DIAGNOSIS — M109 Gout, unspecified: Secondary | ICD-10-CM | POA: Diagnosis not present

## 2021-07-15 DIAGNOSIS — E039 Hypothyroidism, unspecified: Secondary | ICD-10-CM

## 2021-07-15 DIAGNOSIS — Q211 Atrial septal defect, unspecified: Secondary | ICD-10-CM | POA: Diagnosis not present

## 2021-07-15 DIAGNOSIS — I5022 Chronic systolic (congestive) heart failure: Secondary | ICD-10-CM | POA: Diagnosis present

## 2021-07-15 DIAGNOSIS — Z7901 Long term (current) use of anticoagulants: Secondary | ICD-10-CM | POA: Diagnosis not present

## 2021-07-15 DIAGNOSIS — Z7984 Long term (current) use of oral hypoglycemic drugs: Secondary | ICD-10-CM | POA: Insufficient documentation

## 2021-07-15 DIAGNOSIS — I493 Ventricular premature depolarization: Secondary | ICD-10-CM | POA: Insufficient documentation

## 2021-07-15 DIAGNOSIS — E1122 Type 2 diabetes mellitus with diabetic chronic kidney disease: Secondary | ICD-10-CM | POA: Insufficient documentation

## 2021-07-15 DIAGNOSIS — I350 Nonrheumatic aortic (valve) stenosis: Secondary | ICD-10-CM | POA: Diagnosis not present

## 2021-07-15 DIAGNOSIS — I428 Other cardiomyopathies: Secondary | ICD-10-CM | POA: Insufficient documentation

## 2021-07-15 DIAGNOSIS — R21 Rash and other nonspecific skin eruption: Secondary | ICD-10-CM | POA: Insufficient documentation

## 2021-07-15 LAB — BASIC METABOLIC PANEL
Anion gap: 10 (ref 5–15)
BUN: 17 mg/dL (ref 8–23)
CO2: 27 mmol/L (ref 22–32)
Calcium: 8 mg/dL — ABNORMAL LOW (ref 8.9–10.3)
Chloride: 103 mmol/L (ref 98–111)
Creatinine, Ser: 1.64 mg/dL — ABNORMAL HIGH (ref 0.44–1.00)
GFR, Estimated: 33 mL/min — ABNORMAL LOW (ref >60)
Glucose, Bld: 178 mg/dL — ABNORMAL HIGH (ref 70–99)
Potassium: 3.3 mmol/L — ABNORMAL LOW (ref 3.5–5.1)
Sodium: 140 mmol/L (ref 135–145)

## 2021-07-15 LAB — BRAIN NATRIURETIC PEPTIDE: B Natriuretic Peptide: 514.7 pg/mL — ABNORMAL HIGH (ref 0.0–100.0)

## 2021-07-15 MED ORDER — SPIRONOLACTONE 25 MG PO TABS: 12.5000 mg | ORAL_TABLET | Freq: Every day | ORAL | 3 refills | Status: DC

## 2021-07-15 NOTE — Progress Notes (Signed)
PCP: Ezequiel Essex, MD Cardiology: Dr. Domenic Polite HF Cardiology: Dr. Aundra Dubin  71 y.o. with history of CKD stage IV, permanent atrial fibrillation, and a long-standing nonischemic cardiomyopathy was referred by Dr. Domenic Polite for CHF clinic evaluation.  She first developed cardiac problems in 1988.  At that time, she was admitted with CHF and eventually underwent myocardial biopsy at [facility], per her report showing myocarditis.  Patient states that her EF has been low since then.  She had MDT CRT-D device implanted.  Echo in 7/21 showed EF 20-25%, moderate MR, mild AS. She is now in permanent atrial fibrillation and is s/p AV nodal ablation with BiV pacing. Elevated creatinine has limited medication management.    Echo in 10/22 showed EF 25-30%, severe LV dilation, RV normal, moderate TR, mod-severe AS with mean gradient 9 mmHg but AVA 0.76 cm^2 (?low gradient severe AS), mild-moderate MR, left=>right shunt possible ASD.  TEE done in 10/22 to more closely assess AS.  This showed EF 30%, moderate LV dilation, normal RV, possible calcified vegetation on the ICD lead, small secundum ASD, peak RV-RA gradient 23 mmHg, low flow/low gradient moderate AS, mild-moderate MR. RHC/LHC was done, showing mild nonobstructive CAD, left to right atrial level shunt with Qp/Qs 1.43.  I reviewed TEE and cath report with Dr. Burt Knack.  We decided that AS was not severe enough yet for TAVR and that the ASD was not significant enough to close percutaneously. With possible calcified vegetation on ICD, we drew blood cultures which were negative.  She saw ID and had a PET-CT, which did not suggest active endocarditis.   Patient was found on Zio patch in 10/22 to have 12.6% PVCs, this was thought to be limiting her BiV pacing.  She has a history of intolerance of amiodarone so was started on mexiletine.  She says that this caused a rash so she stopped it and rash resolved.   Today she returns for HF followup.  She has dyspnea only with  heavier exertion.  No problems with ADLs.  No chest pain or lightheadedness.  No orthopnea/PND.  She decreased Toprol XL to 100 mg daily due to nightmares when taking 150 mg daily.  She continues to follow with Dr. Moshe Cipro for nephrology. Weight stable.     Labs (1/21): LDL 118 Labs (9/21): K 4.4, creatinine 1.93 Labs (12/21): K 4, creatinine 1.77 Labs (2/22): TSH normal Labs (5/22): K 5, creatinine 2.42 Labs (9/22): K 4.3, creatinine 1.73, LDL 90 Labs (10/22): K 4.2, creatinine 2.18 Labs (2/23): K 3.8, creatinine 2.42  Medtronic device interrogation: Bi V pacing 96%. Fluid index < threshold   PMH: 1. Gout 2. Complete heart block: Medtronic CRT device.  3. CKD stage IV: Diabetic nephropathy.  4. Type 2 diabetes 5. Atrial fibrillation: Now permanent.   - AF ablation 4/18.  - AV nodal ablation in 2019 with MDT CRT-D.  6. Remote LV thrombus 7. Chronic systolic CHF: Nonischemic cardiomyopathy. Medtronic CRT-D device.  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  - LHC 2004: Nonobstructive mild CAD.  - Echo (8/19): EF 10-15%, mildly dilated LV, mild-moderately dilated RV with mild-moderately decreased systolic function.  - Echo (7/21): EF 20-25%, low normal RV function, moderate MR, mild AS.  - Echo (10/22): EF 25-30%, severe LV dilation, RV normal, moderate TR, mod-severe AS with mean gradient 9 mmHg but AVA 0.76 cm^2 (?low gradient severe AS), mild-moderate MR, left=>right shunt possible ASD.  - RHC/LHC (10/22): mild nonobstructive CAD, left to  right atrial level shunt with Qp/Qs 1.43.  8. H/o cholecystectomy 9. Hypothyroidism 10. 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 Aortic stenosis: TEE (10/22) showed EF 30%, moderate LV dilation, normal RV, possible calcified vegetation on the ICD lead, small secundum ASD, peak RV-RA gradient 23 mmHg, low flow/low gradient moderate AS, mild-moderate MR.  13. 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  12. 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  - RHC in 10/22 showed atrial level shunt with Qp/Qs 1.43.  15. PVCs: Zio monitor (10/22) with 12.6% PVCs. - Intolerant of both amiodarone and mexiletine.   SH: Married, lives in [place].  No smoking or ETOH.   Family History  Problem Relation Age of Onset   Hip fracture Mother    COPD Father    CAD Sister    Heart attack Sister    ROS: All systems reviewed and negative except as per HPI.   Current Outpatient Medications  Medication Sig Dispense Refill   acetaminophen (TYLENOL) 500 MG tablet Take 1,000 mg by mouth every 6 (six) hours as needed for mild pain.     allopurinol (ZYLOPRIM) 100 MG tablet Take 100-200 mg by mouth See admin instructions. Take 200 mg in the morning 100 mg at bedtime     Cinnamon 500 MG capsule Take 500 mg by mouth daily.     diclofenac Sodium (VOLTAREN) 1 % GEL Apply 4 g topically 4 (four) times daily. 350 g 1   furosemide (LASIX) 80 MG tablet Take 1 tablet (80 mg total) by mouth every morning. 90 tablet 3   Glucosamine-Chondroitin (OSTEO BI-FLEX REGULAR STRENGTH PO) Take 1 tablet by mouth daily.     JARDIANCE 10 MG TABS tablet TAKE 1 TABLET ONCE A DAY BEFORE BREAKFAST. 30 tablet 11   levothyroxine (SYNTHROID) 88 MCG tablet Take 1 tablet (88 mcg total) by mouth daily before breakfast. 30 tablet 11   loratadine (CLARITIN) 10 MG tablet Take 10 mg by mouth daily.     metoprolol succinate (TOPROL-XL) 100 MG 24 hr tablet Take 100 mg by mouth every other day. Take with or immediately following a meal. Alternating with 75 mg     Omega-3 Fatty Acids (FISH OIL) 1200 MG CPDR Take 1,200 mg by mouth daily.     omeprazole (PRILOSEC) 40 MG capsule TAKE 1 CAPSULE BY MOUTH ONCE DAILY. 90 capsule 3   simvastatin (ZOCOR) 20 MG tablet TAKE 1 TABLET EVERY DAY AT 6 PM 90 tablet 3   spironolactone (ALDACTONE) 25 MG tablet Take 0.5 tablets (12.5 mg total) by mouth daily. 45  tablet 3   TOUJEO SOLOSTAR 300 UNIT/ML Solostar Pen INJECT 24 UNITS INTO THE SKIN EVERY DAY 4.5 mL 3   warfarin (COUMADIN) 2 MG tablet TAKE 1 TO 2 TABLETS ONCE A DAY AS DIRECTED BY ANTICOAGULATION. (Patient taking differently: Take 2-4 mg by mouth See admin instructions. TAKE 1 TO 2 TABLETS ONCE A DAY AS DIRECTED BY ANTICOAGULATION;  4 mg on Mon and Thurs, 2 mg all other days) 40 tablet 5   ENTRESTO 24-26 MG TAKE (1) TABLET TWICE DAILY. 60 tablet 0   No current facility-administered medications for this encounter.   BP 102/60   Pulse 64   Wt 62.3 kg (137 lb 6.4 oz)   SpO2 98%   BMI 25.13 kg/m  Wt Readings from Last 3 Encounters:  07/15/21 62.3 kg (137 lb 6.4 oz)  07/01/21 61.4 kg (135 lb 6.4 oz)  05/10/21 60.3 kg (133 lb)   General: NAD Neck: No JVD, no thyromegaly or thyroid nodule.  Lungs: Clear to auscultation bilaterally with normal respiratory effort. CV: Nondisplaced PMI.  Soft heart sounds. Heart regular S1/S2, no S3/S4, 2/6 SEM RUSB.  No peripheral edema.  No carotid bruit.  Normal pedal pulses.  Abdomen: Soft, nontender, no hepatosplenomegaly, no distention.  Skin: Intact without lesions or rashes.  Neurologic: Alert and oriented x 3.  Psych: Normal affect. Extremities: No clubbing or cyanosis.  HEENT: Normal.   Assessment/Plan: 1. Chronic systolic CHF: Nonischemic cardiomyopathy.  Low EF for years.  Per patient's report, she had an endomyocardial biopsy at [facility] in 1988 showing myocarditis.  Medication titration has been limited by CKD stage IV. MDT CRT-D device.   Echo in 7/21 showed EF 20-25%, moderate MR, mild AS.  Echo 12/17/20 showed EF 25-30%, severe LV dilation, RV normal, moderate TR, mod-severe AS with mean gradient 9 mmHg but AVA 0.76 cm^2.  TEE in 10/22 with EF 30%.  LHC in 10/22 with mild nonobstructive CAD. She is BiV pacing 96% of the time.  By exam and Optivol, she is not volume overloaded. NYHA class II symptoms.   - Continue Lasix 80 mg daily.   - Start  spironolactone 12.5 mg daily, BMET/BNP today and BMET in 10 days.  - Continue Jardiance 10 mg daily.  - Continue Entresto 24/26 bid.  - Continue Toprol XL 100 mg daily, cannot tolerate higher dose.   - Echo in 10/23 2.  Atrial fibrillation: Permanent.  S/p AV nodal ablation with BiV pacing.   - Continue warfarin.   4. PVCs: Zio 10/22 with 12.6% PVCs. Suspect fewer now as BiV pacing percentage up to 96%.  - Continue Toprol XL 100 mg daily.  - Unable to tolerate amiodarone in the past.  - She had a rash with mexiletine and stopped it.  5. Aortic stenosis: TEE in 10/22 showed low flow/low gradient moderate aortic stenosis.  Discussed with Dr. Burt Knack, not at point where she needs TAVR yet.  - Repeat echo around 10/23.  6. ASD: Patient has an ASD by echo with L=>R shunting, small.  Qp/Qs by RHC was 1.43. Reviewed with Dr Burt Knack, do not think repair indicated at this point.  7. Gout: Stable.  8. Calcified device lead vegetation: Blood cultures negative, PET-CT in 11/22 not suggestive of active endocarditis.   Followup in 3 months with APP.   Loralie Champagne 07/15/2021

## 2021-07-15 NOTE — Patient Instructions (Addendum)
Continue to gradually increase your Toprol XL to 100mg  Daily.  Start Spironolactone 12.5mg  (1/2 Tab) daily  Labs done today, your results will be available in MyChart, we will contact you for abnormal readings.  Repeat blood work in 10 days.  Your physician recommends that you schedule a follow-up appointment in: 3 months.  If you have any questions or concerns before your next appointment please send [place] a message through [place] or call our office at [phone].    TO LEAVE A MESSAGE FOR THE NURSE SELECT OPTION 2, PLEASE LEAVE A MESSAGE INCLUDING: YOUR NAME DATE OF BIRTH CALL BACK NUMBER REASON FOR CALL**this is important as we prioritize the call backs  YOU WILL RECEIVE A CALL BACK THE SAME DAY AS LONG AS YOU CALL BEFORE 4:00 PM  At the [facility], you and your health needs are our priority. As part of our continuing mission to provide you with exceptional heart care, we have created designated Provider Care Teams. These Care Teams include your primary Cardiologist (physician) and Advanced Practice Providers (APPs- Physician Assistants and Nurse Practitioners) who all work together to provide you with the care you need, when you need it.   You may see any of the following providers on your designated Care Team at your next follow up: Dr Glori Bickers Dr Haynes Kerns, NP Lyda Jester, [place] [facility] [place], [place] Audry Riles, PharmD   Please be sure to bring in all your medications bottles to every appointment.

## 2021-07-16 ENCOUNTER — Other Ambulatory Visit: Payer: Self-pay | Admitting: Family Medicine

## 2021-07-16 DIAGNOSIS — E039 Hypothyroidism, unspecified: Secondary | ICD-10-CM

## 2021-07-16 NOTE — Progress Notes (Signed)
Future TSH for yearly thyroid check.   Ezequiel Essex, MD

## 2021-07-26 ENCOUNTER — Other Ambulatory Visit (HOSPITAL_COMMUNITY): Payer: Self-pay | Admitting: Cardiology

## 2021-07-29 ENCOUNTER — Other Ambulatory Visit: Payer: Self-pay | Admitting: Cardiology

## 2021-07-29 ENCOUNTER — Other Ambulatory Visit: Payer: Self-pay | Admitting: Family Medicine

## 2021-07-30 LAB — BASIC METABOLIC PANEL
BUN/Creatinine Ratio: 14 (ref 12–28)
BUN: 25 mg/dL (ref 8–27)
CO2: 24 mmol/L (ref 20–29)
Calcium: 8.2 mg/dL — ABNORMAL LOW (ref 8.7–10.3)
Chloride: 99 mmol/L (ref 96–106)
Creatinine, Ser: 1.8 mg/dL — ABNORMAL HIGH (ref 0.57–1.00)
Glucose: 180 mg/dL — ABNORMAL HIGH (ref 70–99)
Potassium: 4.2 mmol/L (ref 3.5–5.2)
Sodium: 140 mmol/L (ref 134–144)
eGFR: 30 mL/min/{1.73_m2} — ABNORMAL LOW (ref >59)

## 2021-07-30 LAB — TSH: TSH: 0.456 u[IU]/mL (ref 0.450–4.500)

## 2021-08-02 ENCOUNTER — Other Ambulatory Visit (HOSPITAL_COMMUNITY): Payer: Self-pay

## 2021-08-02 ENCOUNTER — Telehealth (HOSPITAL_COMMUNITY): Payer: Self-pay | Admitting: Pharmacy Technician

## 2021-08-02 ENCOUNTER — Ambulatory Visit (INDEPENDENT_AMBULATORY_CARE_PROVIDER_SITE_OTHER): Payer: PPO

## 2021-08-02 DIAGNOSIS — Z95810 Presence of automatic (implantable) cardiac defibrillator: Secondary | ICD-10-CM | POA: Diagnosis not present

## 2021-08-02 DIAGNOSIS — I5022 Chronic systolic (congestive) heart failure: Secondary | ICD-10-CM

## 2021-08-02 MED ORDER — ENTRESTO 24-26 MG PO TABS: 1.0000 | ORAL_TABLET | Freq: Two times a day (BID) | ORAL | 3 refills | Status: DC

## 2021-08-02 NOTE — Telephone Encounter (Signed)
Advanced Heart Failure Patient Advocate Encounter  Patient called stating that she would like assistance for Entresto. She submitted her portion of the application and is willing to provide POI now.   Will submit provider's portion and physical RX.

## 2021-08-03 ENCOUNTER — Ambulatory Visit: Payer: PPO | Admitting: *Deleted

## 2021-08-03 DIAGNOSIS — I4891 Unspecified atrial fibrillation: Secondary | ICD-10-CM | POA: Diagnosis not present

## 2021-08-03 DIAGNOSIS — Z5181 Encounter for therapeutic drug level monitoring: Secondary | ICD-10-CM

## 2021-08-03 DIAGNOSIS — I513 Intracardiac thrombosis, not elsewhere classified: Secondary | ICD-10-CM | POA: Diagnosis not present

## 2021-08-03 LAB — POCT INR: INR: 2.2 (ref 2.0–3.0)

## 2021-08-03 NOTE — Telephone Encounter (Signed)
Sent in via fax. Document scanned to chart.

## 2021-08-03 NOTE — Patient Instructions (Signed)
Continue 1 tablet daily except 2 tablets on Mondays and Thursdays.  Continue greens Recheck in 4 weeks -Call [phone] if started on any new medications.

## 2021-08-05 ENCOUNTER — Telehealth: Payer: Self-pay

## 2021-08-05 NOTE — Progress Notes (Signed)
EPIC Encounter for ICM Monitoring  Patient Name: Emily [name] is a 71 y.o. female Date: 08/05/2021 Primary Care Physican: Ezequiel Essex, MD Primary Cardiologist: [facility] Electrophysiologist: Santina Evans Pacing: 98.8%  07/15/2021 Weight: 137 lbs           Attempted call to patient and unable to reach. Transmission reviewed.            Optivol Thoracic impedance suggesting normal fluid levels.    Prescribed: Furosemide 80 mg Take 1 tablet (80 mg total) by mouth every morning.  She self adjusts Furosemide as needed.    Labs: 07/29/2021 Creatinine 1.80, BUN 25, Potassium 4.2, Sodium 140, GFR 30 07/15/2021 Creatinine 1.64, BUN 17, Potassium 3.3, Sodium 140  A complete set of results can be found in Results Review.   Recommendations:  Unable to reach.     Follow-up plan: ICM clinic phone appointment on 09/06/2021.  91 day device clinic remote transmission 11/09/2021.      EP/Cardiology Office Visits:   08/10/2021 with Dr Lovena Le.  10/19/2021 with HF clinic.       Copy of ICM check sent to Dr. Lovena Le.   3 month ICM trend: 08/02/2021.    12-14 Month ICM trend:     [name], RN 08/05/2021 12:42 PM

## 2021-08-05 NOTE — Telephone Encounter (Signed)
Remote ICM transmission received.  Attempted call to patient regarding ICM remote transmission and no answer or voice mail option.  

## 2021-08-09 NOTE — Telephone Encounter (Addendum)
Advanced Heart Failure Patient Advocate Encounter   Patient was approved to receive Entresto from [employer]  Effective dates: 08/09/21 through 02/27/22  Document scanned to chart.   Charlann Boxer, CPhT

## 2021-08-10 ENCOUNTER — Ambulatory Visit (INDEPENDENT_AMBULATORY_CARE_PROVIDER_SITE_OTHER): Payer: PPO

## 2021-08-10 ENCOUNTER — Encounter: Payer: Self-pay | Admitting: Internal Medicine

## 2021-08-10 ENCOUNTER — Ambulatory Visit (INDEPENDENT_AMBULATORY_CARE_PROVIDER_SITE_OTHER): Payer: PPO | Admitting: Internal Medicine

## 2021-08-10 VITALS — BP 126/74 | HR 85 | Ht 62.0 in | Wt 135.4 lb

## 2021-08-10 DIAGNOSIS — I4821 Permanent atrial fibrillation: Secondary | ICD-10-CM | POA: Diagnosis not present

## 2021-08-10 DIAGNOSIS — I5022 Chronic systolic (congestive) heart failure: Secondary | ICD-10-CM | POA: Diagnosis not present

## 2021-08-10 DIAGNOSIS — I420 Dilated cardiomyopathy: Secondary | ICD-10-CM

## 2021-08-10 LAB — CUP PACEART REMOTE DEVICE CHECK
Battery Remaining Longevity: 104 mo
Battery Voltage: 3.11 V
Brady Statistic AP VP Percent: 0 %
Brady Statistic AP VS Percent: 0 %
Brady Statistic AS VP Percent: 0 %
Brady Statistic AS VS Percent: 0 %
Brady Statistic RA Percent Paced: 0 %
Brady Statistic RV Percent Paced: 97.69 %
Date Time Interrogation Session: 20230613022824
HighPow Impedance: 62 Ohm
Implantable Lead Implant Date: 20050218
Implantable Lead Implant Date: 20050218
Implantable Lead Implant Date: 20050218
Implantable Lead Location: 753858
Implantable Lead Location: 753859
Implantable Lead Location: 753860
Implantable Lead Model: 4194
Implantable Lead Model: 5076
Implantable Lead Model: 6947
Implantable Pulse Generator Implant Date: 20230313
Lead Channel Impedance Value: 342 Ohm
Lead Channel Impedance Value: 342 Ohm
Lead Channel Impedance Value: 399 Ohm
Lead Channel Impedance Value: 570 Ohm
Lead Channel Impedance Value: 627 Ohm
Lead Channel Impedance Value: 893 Ohm
Lead Channel Pacing Threshold Amplitude: 1.25 V
Lead Channel Pacing Threshold Amplitude: 1.5 V
Lead Channel Pacing Threshold Pulse Width: 0.4 ms
Lead Channel Pacing Threshold Pulse Width: 0.4 ms
Lead Channel Sensing Intrinsic Amplitude: 1 mV
Lead Channel Sensing Intrinsic Amplitude: 3.375 mV
Lead Channel Sensing Intrinsic Amplitude: 3.375 mV
Lead Channel Setting Pacing Amplitude: 2.25 V
Lead Channel Setting Pacing Amplitude: 2.5 V
Lead Channel Setting Pacing Pulse Width: 0.4 ms
Lead Channel Setting Pacing Pulse Width: 0.4 ms
Lead Channel Setting Sensing Sensitivity: 0.3 mV

## 2021-08-10 NOTE — Patient Instructions (Signed)
Medication Instructions:  Your physician recommends that you continue on your current medications as directed. Please refer to the Current Medication list given to you today.  *If you need a refill on your cardiac medications before your next appointment, please call your pharmacy*   Lab Work: NONE   If you have labs (blood work) drawn today and your tests are completely normal, you will receive your results only by: MyChart Message (if you have MyChart) OR A paper copy in the mail If you have any lab test that is abnormal or we need to change your treatment, we will call you to review the results.   Testing/Procedures: NONE    Follow-Up: At [facility], you and your health needs are our priority.  As part of our continuing mission to provide you with exceptional heart care, we have created designated Provider Care Teams.  These Care Teams include your primary Cardiologist (physician) and Advanced Practice Providers (APPs -  Physician Assistants and Nurse Practitioners) who all work together to provide you with the care you need, when you need it.  We recommend signing up for the patient portal called "MyChart".  Sign up information is provided on this After Visit Summary.  MyChart is used to connect with patients for Virtual Visits (Telemedicine).  Patients are able to view lab/test results, encounter notes, upcoming appointments, etc.  Non-urgent messages can be sent to your provider as well.   To learn more about what you can do with MyChart, go to [email or web address].com.    Your next appointment:   1 year(s)  The format for your next appointment:   In Person  Provider:   Gregg Taylor, MD    Other Instructions Thank you for choosing [facility]!    Important Information About Sugar       

## 2021-08-10 NOTE — Progress Notes (Signed)
HPI Emily [name] returns today for followup. She is a pleasant 71 yo woman with a h/o both atrial and ventricular arrhythmias. She has undergone AV node ablation. She has persistent atrial fib. She has a h/o dietary indiscretion and has undergone adjustments of her meds and dietary change. She has managed to avoid the hospital in the last year. No ICD therapies. Allergies  Allergen Reactions   Amiodarone Rash   Codeine Other (See Comments)    Euphoria, hallucinations   Other Palpitations    PT STATES SHE GETS REALLY HOT , DIZZY HEADED, HAS PASSED OUT TWICE , AND ITCHING WITH IV DYE   Turmeric     Pt went into AFIB    Colchicine Rash   Iodine Rash    topical   Mexitil [Mexiletine] Rash     Current Outpatient Medications  Medication Sig Dispense Refill   acetaminophen (TYLENOL) 500 MG tablet Take 1,000 mg by mouth every 6 (six) hours as needed for mild pain.     allopurinol (ZYLOPRIM) 100 MG tablet Take 100-200 mg by mouth See admin instructions. Take 200 mg in the morning 100 mg at bedtime     Cinnamon 500 MG capsule Take 500 mg by mouth daily.     diclofenac Sodium (VOLTAREN) 1 % GEL Apply 4 g topically 4 (four) times daily. 350 g 1   furosemide (LASIX) 80 MG tablet Take 1 tablet (80 mg total) by mouth every morning. 90 tablet 3   Glucosamine-Chondroitin (OSTEO BI-FLEX REGULAR STRENGTH PO) Take 1 tablet by mouth daily.     JARDIANCE 10 MG TABS tablet TAKE 1 TABLET ONCE A DAY BEFORE BREAKFAST. 30 tablet 11   levothyroxine (SYNTHROID) 88 MCG tablet TAKE 1 TABLET BY MOUTH DAILY BEFORE BREAKFAST. 30 tablet 11   loratadine (CLARITIN) 10 MG tablet Take 10 mg by mouth daily.     metoprolol succinate (TOPROL-XL) 100 MG 24 hr tablet Take 100 mg by mouth daily. Take with or immediately following a meal. Alternating with 75 mg     Omega-3 Fatty Acids (FISH OIL) 1200 MG CPDR Take 1,200 mg by mouth daily.     omeprazole (PRILOSEC) 40 MG capsule TAKE 1 CAPSULE BY MOUTH ONCE DAILY. 90  capsule 3   sacubitril-valsartan (ENTRESTO) 24-26 MG Take 1 tablet by mouth 2 (two) times daily. 180 tablet 3   simvastatin (ZOCOR) 20 MG tablet TAKE 1 TABLET EVERY DAY AT 6 PM 90 tablet 3   spironolactone (ALDACTONE) 25 MG tablet Take 0.5 tablets (12.5 mg total) by mouth daily. 45 tablet 3   TOUJEO SOLOSTAR 300 UNIT/ML Solostar Pen INJECT 24 UNITS INTO THE SKIN EVERY DAY 4.5 mL 3   warfarin (COUMADIN) 2 MG tablet TAKE 1 TO 2 TABLETS ONCE A DAY AS DIRECTED BY ANTICOAGULATION. (Patient taking differently: Take 2-4 mg by mouth See admin instructions. TAKE 1 TO 2 TABLETS ONCE A DAY AS DIRECTED BY ANTICOAGULATION;  4 mg on Mon and Thurs, 2 mg all other days) 40 tablet 5   No current facility-administered medications for this visit.     Past Medical History:  Diagnosis Date   Acute pain of right shoulder 03/22/2019   Acute renal failure ([place]) 04/15/2012   Last Assessment & Plan:  Superimposed on CK D stage III, Baseline Cr ~2-2.5, remains above baseline, felt to be due to Cardiorenal syndrome, creatinine trending down at time of discharge Negative urine eos and no urinary retention ACE inhibitor discontinued Follows with Dr Karen Chafe  as outpatient, to have labs drawn 08/15/16 and results sent to [facility] office for review   Anemia 12/29/2016   Biventricular ICD (implantable cardioverter-defibrillator) in place    CHF (congestive heart failure) ([place])    CKD (chronic kidney disease), stage IV ([place])    Colitis 09/11/2012   Complete heart block (H[facility])    Essential hypertension    Hypomagnesemia 04/10/2016   Hyponatremia 04/10/2016   Last Assessment & Plan:  - improved after tolvaptan 6/29, chiefly driven by CHF, further improvement with ongoing IV Lasix - continue low Na and fluid restriction - compliance with sodium restriction a major barrier to keeping patient out of hospital and compensated, remains resistant to this philosophy while inpatient - follows with Dr Karen Chafe as outpatient, Neph consult not  indicated at this ti   Localized macular rash 08/11/2016   Last Assessment & Plan:  Seems to trend with heart failure, though she has more findings behind bilateral knees and up into her thigh/groin area than when I last saw her Would use steroid cream for now, consider alternative diagnosis if no improvement   Nonischemic cardiomyopathy ([place])    On amiodarone therapy 08/21/2017   Persistent atrial fibrillation ([place])    AV node ablation 2019   Restless leg syndrome 03/22/2019   Type 2 diabetes mellitus ([place])     ROS:   All systems reviewed and negative except as noted in the HPI.   Past Surgical History:  Procedure Laterality Date   ABLATION     AV NODE ABLATION N/A 08/08/2017   Procedure: AV NODE ABLATION;  Surgeon: Thompson Grayer, MD;  Location: [facility];  Service: Cardiovascular;  Laterality: N/A;   BREAST BIOPSY     CARDIAC DEFIBRILLATOR PLACEMENT  2005   MDT Viva XT CRT-D BiV ICD implanted by Dr Rosaland Lao in [place] Bilateral    [place] N/A 05/10/2021   Procedure: [place];  Surgeon: Evans Lance, MD;  Location: [facility];  Service: Cardiovascular;  Laterality: N/A;   RIGHT HEART CATH AND CORONARY ANGIOGRAPHY N/A 12/17/2020   Procedure: RIGHT HEART CATH AND CORONARY ANGIOGRAPHY;  Surgeon: Larey Dresser, MD;  Location: [facility];  Service: Cardiovascular;  Laterality: N/A;   TEE WITHOUT CARDIOVERSION N/A 12/17/2020   Procedure: TRANSESOPHAGEAL ECHOCARDIOGRAM (TEE);  Surgeon: Larey Dresser, MD;  Location: [facility];  Service: Cardiovascular;  Laterality: N/A;   THYROIDECTOMY     TOTAL VAGINAL HYSTERECTOMY       Family History  Problem Relation Age of Onset   Hip fracture Mother    COPD Father    CAD Sister    Heart attack Sister      Social History   Socioeconomic History   Marital status: Married    Spouse name: Not on file   Number of children: Not  on file   Years of education: Not on file   Highest education level: Not on file  Occupational History   Not on file  Tobacco Use   Smoking status: Never   Smokeless tobacco: Never  Vaping Use   Vaping Use: Never used  Substance and Sexual Activity   Alcohol use: No   Drug use: No   Sexual activity: Not on file  Other Topics Concern   Not on file  Social History Narrative   Not on file   Social Determinants of Health   Financial Resource Strain:  Not on file  Food Insecurity: Not on file  Transportation Needs: Not on file  Physical Activity: Not on file  Stress: Not on file  Social Connections: Not on file  Intimate Partner Violence: Not on file     BP 126/74   Pulse 85   Ht 5\' 2"  (1.575 m)   Wt 135 lb 6.4 oz (61.4 kg)   SpO2 99%   BMI 24.76 kg/m   Physical Exam:  Well appearing NAD HEENT: Unremarkable Neck:  No JVD, no thyromegally Lymphatics:  No adenopathy Back:  No CVA tenderness Lungs:  Clear with no wheezes HEART:  Regular rate rhythm, no murmurs, no rubs, no clicks Abd:  soft, positive bowel sounds, no organomegally, no rebound, no guarding Ext:  2 plus pulses, no edema, no cyanosis, no clubbing Skin:  No rashes no nodules Neuro:  CN II through XII intact, motor grossly intact  DEVICE  Normal device function.  See PaceArt for details.   Assess/Plan:   1. Atrial fib - her rates are well controlled s/p AV node ablation. No change in meds. 2. Biv ICD - her medtronic device is working normally. She is s/p gen change out.  3. Chronic systolic heart failure - her symptoms are class 2A. I encouraged her to avoid salty foods. 4. VT - she has not had any sustained but has had some non-sustained since her last check.   Carleene Overlie Carmellia Kreisler,MD

## 2021-08-13 ENCOUNTER — Other Ambulatory Visit: Payer: Self-pay | Admitting: Cardiology

## 2021-08-13 DIAGNOSIS — Z5181 Encounter for therapeutic drug level monitoring: Secondary | ICD-10-CM

## 2021-08-13 DIAGNOSIS — I4821 Permanent atrial fibrillation: Secondary | ICD-10-CM

## 2021-08-13 NOTE — Telephone Encounter (Signed)
Prescription refill request received for warfarin Lov: taylor 08/10/2021 Next INR check: 7/6 Warfarin tablet strength:  2mg 

## 2021-08-25 NOTE — Progress Notes (Signed)
Remote ICD transmission.   

## 2021-09-02 ENCOUNTER — Ambulatory Visit: Payer: PPO | Admitting: *Deleted

## 2021-09-02 DIAGNOSIS — Z5181 Encounter for therapeutic drug level monitoring: Secondary | ICD-10-CM

## 2021-09-02 DIAGNOSIS — I4891 Unspecified atrial fibrillation: Secondary | ICD-10-CM

## 2021-09-02 DIAGNOSIS — I513 Intracardiac thrombosis, not elsewhere classified: Secondary | ICD-10-CM | POA: Diagnosis not present

## 2021-09-02 LAB — POCT INR: INR: 2.5 (ref 2.0–3.0)

## 2021-09-02 NOTE — Patient Instructions (Signed)
Continue 1 tablet daily except 2 tablets on Mondays and Thursdays.  Continue greens Recheck in 5 weeks -Call [phone] if started on any new medications.

## 2021-09-06 ENCOUNTER — Ambulatory Visit (INDEPENDENT_AMBULATORY_CARE_PROVIDER_SITE_OTHER): Payer: PPO

## 2021-09-06 DIAGNOSIS — Z95810 Presence of automatic (implantable) cardiac defibrillator: Secondary | ICD-10-CM

## 2021-09-06 DIAGNOSIS — I5022 Chronic systolic (congestive) heart failure: Secondary | ICD-10-CM

## 2021-09-07 ENCOUNTER — Telehealth: Payer: Self-pay | Admitting: Internal Medicine

## 2021-09-07 NOTE — Telephone Encounter (Signed)
Patient is calling wanting to know if it is okay for her to take mucinex with her heart condition and medications she is on.

## 2021-09-07 NOTE — Telephone Encounter (Signed)
I advise patient she may take plain Mucinex

## 2021-09-09 NOTE — Progress Notes (Signed)
EPIC Encounter for ICM Monitoring  Patient Name: Emily [name] is a 71 y.o. female Date: 09/09/2021 Primary Care Physican: Ezequiel Essex, MD Primary Cardiologist: [facility] Electrophysiologist: Santina Evans Pacing: 97.6%  09/09/2021 Weight: 133-134 lbs           Spoke with patient and heart failure questions reviewed.  Pt asymptomatic for fluid accumulation.  Reports feeling well at this time and voices no complaints.           Optivol Thoracic impedance suggesting normal fluid levels.    Prescribed: Furosemide 80 mg Take 1 tablet (80 mg total) by mouth every morning.  She self adjusts Furosemide as needed.    Labs: 07/29/2021 Creatinine 1.80, BUN 25, Potassium 4.2, Sodium 140, GFR 30 07/15/2021 Creatinine 1.64, BUN 17, Potassium 3.3, Sodium 140  A complete set of results can be found in Results Review.   Recommendations:  No changes and encouraged to call if experiencing any fluid symptoms.   Follow-up plan: ICM clinic phone appointment on 10/11/2021.  91 day device clinic remote transmission 11/09/2021.      EP/Cardiology Office Visits:  Recall 08/05/2022 with Dr Lovena Le.  10/19/2021 with HF clinic.       Copy of ICM check sent to Dr. Lovena Le.   3 month ICM trend: 09/06/2021.    12-14 Month ICM trend:     [name], RN 09/09/2021 9:33 AM

## 2021-09-14 ENCOUNTER — Other Ambulatory Visit: Payer: Self-pay | Admitting: Family Medicine

## 2021-09-20 IMAGING — DX DG WRIST COMPLETE 3+V*R*
3 series · 3 of 3 positions shown · non-contrast
Comparison: None.

CLINICAL DATA: Wrist pain that is atraumatic and present for 2 days

EXAM:
RIGHT WRIST - COMPLETE 3+ VIEW

[wrist ap]
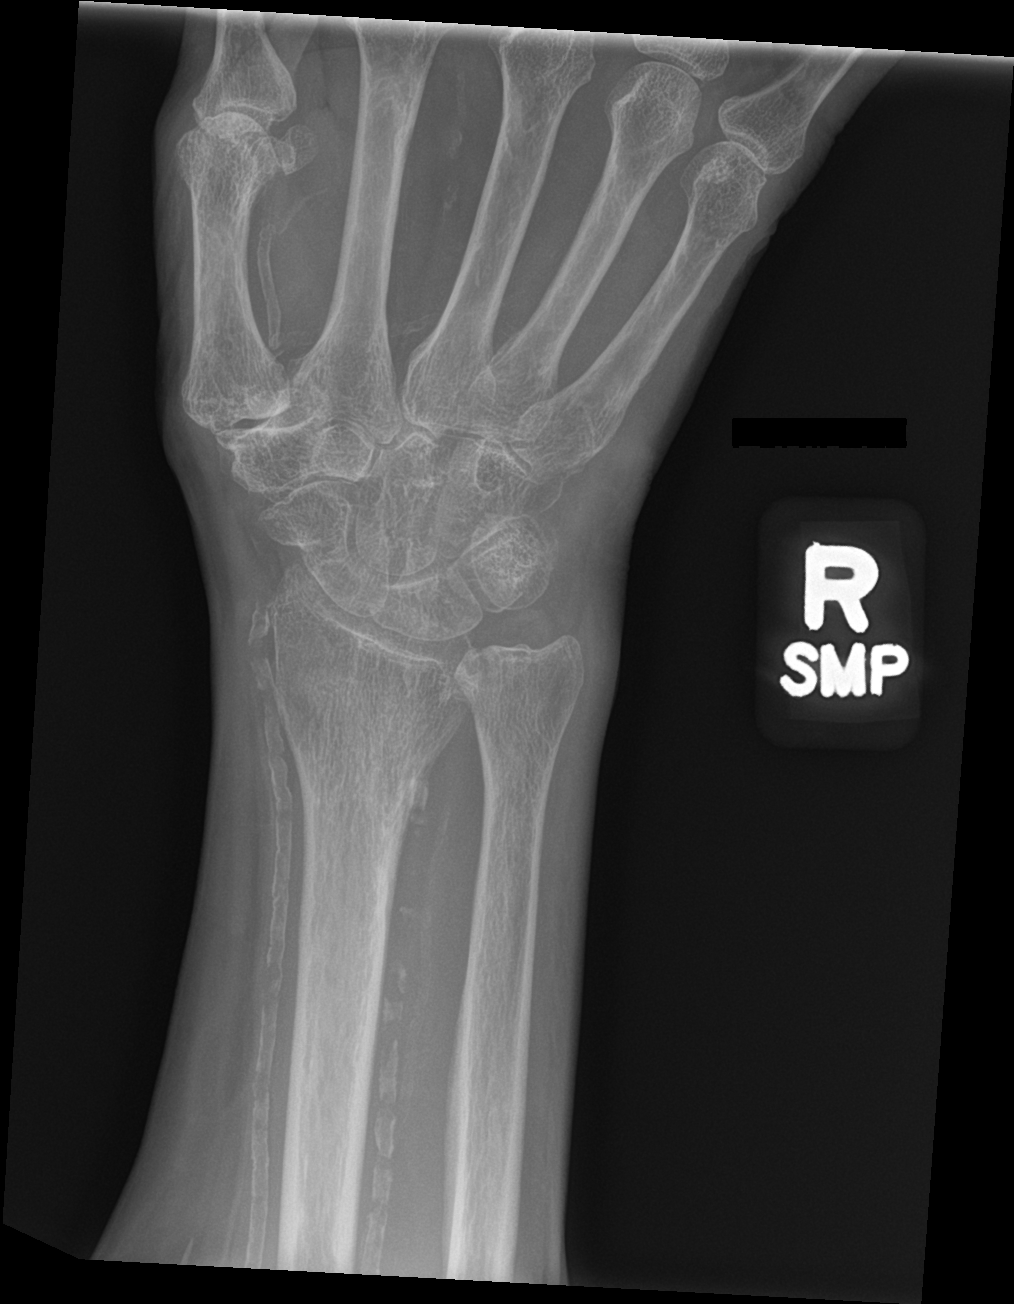

[wrist obl]
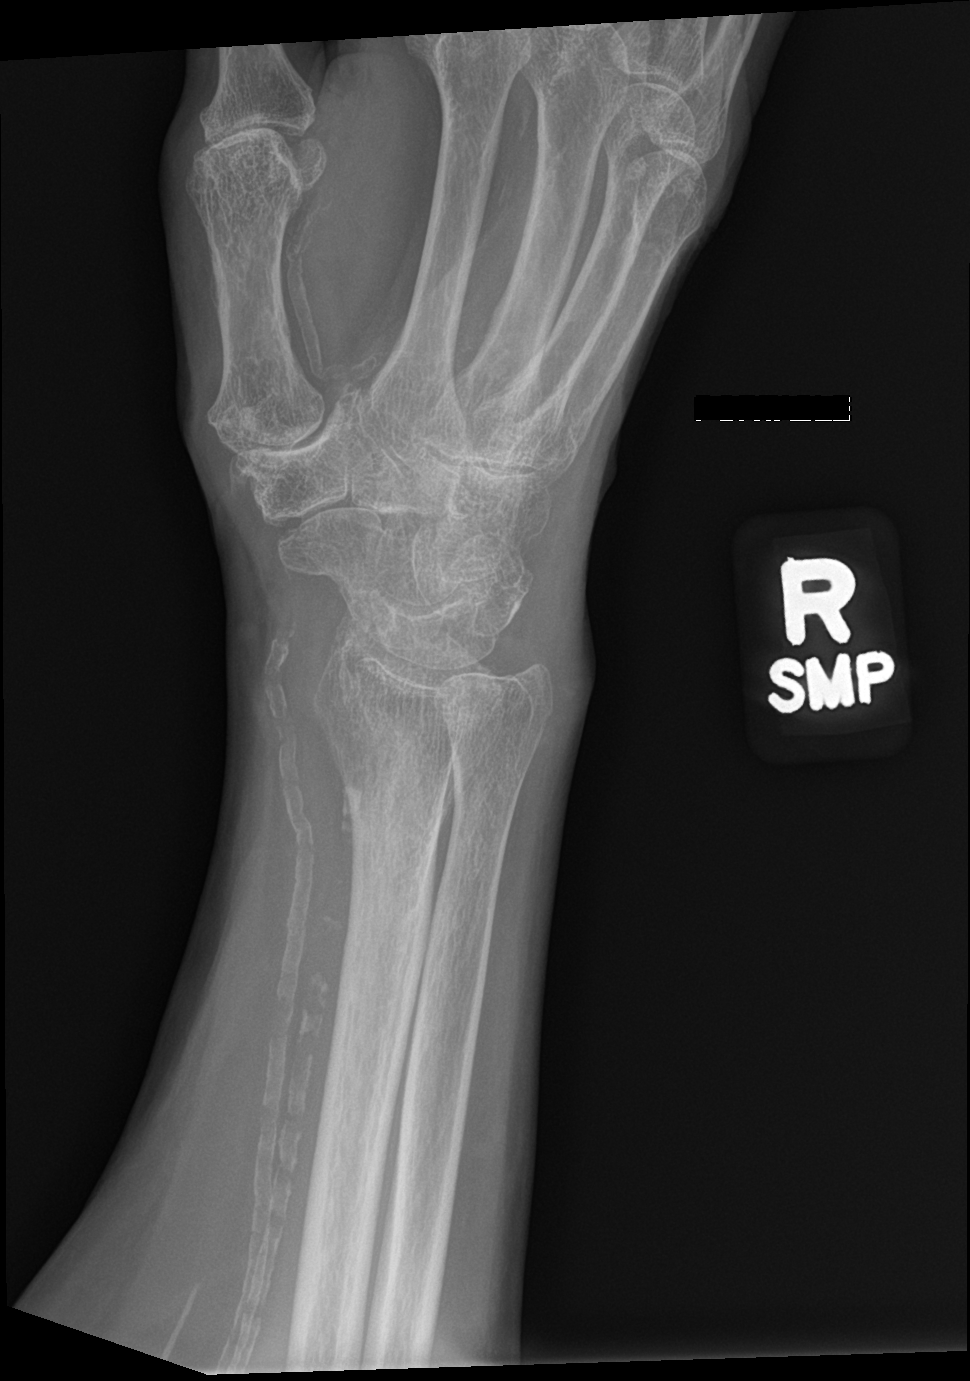

[wrist lat]
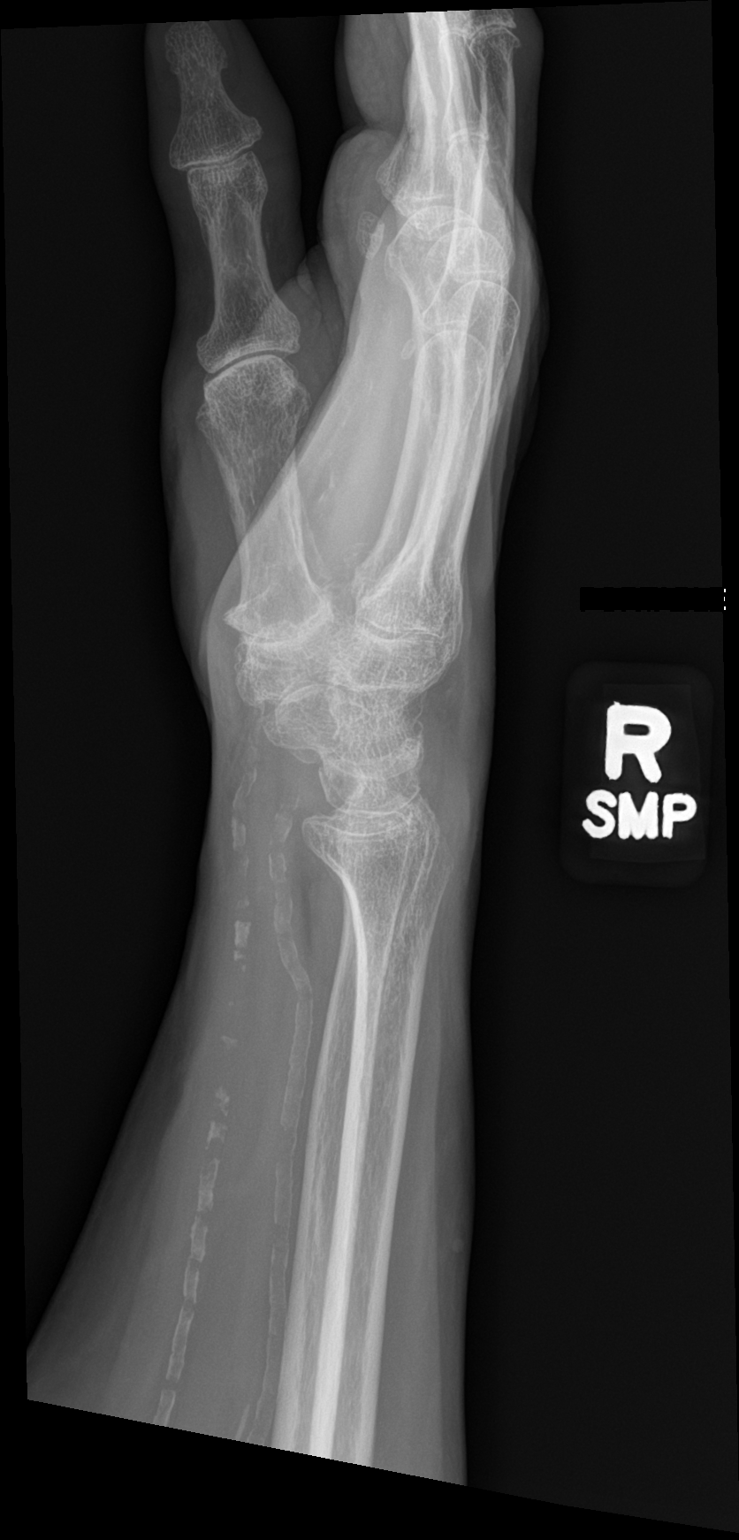

[3 of 3 positions shown; findings below may reference images not displayed]

FINDINGS: Prominent osteopenia and arterial calcification. No fracture,
subluxation, or visible erosion. Degenerative spurring mainly seen
at the base of thumb.
IMPRESSION: No acute or focal finding.

## 2021-10-04 ENCOUNTER — Telehealth: Payer: Self-pay | Admitting: Internal Medicine

## 2021-10-04 NOTE — Telephone Encounter (Signed)
Wrong pt, wife called for husbands refill

## 2021-10-04 NOTE — Telephone Encounter (Signed)
*  STAT* If patient is at the pharmacy, call can be transferred to refill team.   1. Which medications need to be refilled? (please list name of each medication and dose if known)  Pravastatin  2. Which pharmacy/location (including street and city if local pharmacy) is medication to be sent to? [address] [place], [place] [address]  3. Do they need a 30 day or 90 day supply?  90 day supply

## 2021-10-11 ENCOUNTER — Ambulatory Visit (INDEPENDENT_AMBULATORY_CARE_PROVIDER_SITE_OTHER): Payer: PPO

## 2021-10-11 DIAGNOSIS — I5022 Chronic systolic (congestive) heart failure: Secondary | ICD-10-CM | POA: Diagnosis not present

## 2021-10-11 DIAGNOSIS — Z95810 Presence of automatic (implantable) cardiac defibrillator: Secondary | ICD-10-CM

## 2021-10-14 ENCOUNTER — Telehealth: Payer: Self-pay

## 2021-10-14 ENCOUNTER — Other Ambulatory Visit: Payer: Self-pay | Admitting: Cardiology

## 2021-10-14 DIAGNOSIS — Z5181 Encounter for therapeutic drug level monitoring: Secondary | ICD-10-CM

## 2021-10-14 DIAGNOSIS — I4821 Permanent atrial fibrillation: Secondary | ICD-10-CM

## 2021-10-14 NOTE — Progress Notes (Signed)
EPIC Encounter for ICM Monitoring  Patient Name: Emily [name] is a 71 y.o. female Date: 10/14/2021 Primary Care Physican: Ezequiel Essex, MD Primary Cardiologist: [facility] Electrophysiologist: Santina Evans Pacing: 99.5%  09/09/2021 Weight: 133-134 lbs           Attempted call to patient and unable to reach.   Transmission reviewed.          Optivol Thoracic impedance suggesting normal fluid levels.    Prescribed: Furosemide 80 mg Take 1 tablet (80 mg total) by mouth every morning.  She self adjusts Furosemide as needed.    Labs: 07/29/2021 Creatinine 1.80, BUN 25, Potassium 4.2, Sodium 140, GFR 30 07/15/2021 Creatinine 1.64, BUN 17, Potassium 3.3, Sodium 140  A complete set of results can be found in Results Review.   Recommendations:  Unable to reach.     Follow-up plan: ICM clinic phone appointment on 11/15/2021.  91 day device clinic remote transmission 11/09/2021.      EP/Cardiology Office Visits:  Recall 08/05/2022 with Dr Lovena Le.  11/11/2021 with HF clinic.       Copy of ICM check sent to Dr. Lovena Le.   3 month ICM trend: 10/11/2021.    12-14 Month ICM trend:     [name], RN 10/14/2021 9:34 AM

## 2021-10-14 NOTE — Telephone Encounter (Signed)
Remote ICM transmission received.  Attempted call to patient regarding ICM remote transmission and no answer or voice mail option.  

## 2021-10-14 NOTE — Telephone Encounter (Signed)
Prescription refill request received for warfarin Lov: Lovena Le, 08/10/2021 Next INR check: 8/10 Warfarin tablet strength: 2 mg

## 2021-10-19 ENCOUNTER — Encounter (HOSPITAL_COMMUNITY): Payer: PPO

## 2021-10-21 ENCOUNTER — Ambulatory Visit: Payer: PPO | Admitting: *Deleted

## 2021-10-21 DIAGNOSIS — Z5181 Encounter for therapeutic drug level monitoring: Secondary | ICD-10-CM | POA: Diagnosis not present

## 2021-10-21 DIAGNOSIS — I4891 Unspecified atrial fibrillation: Secondary | ICD-10-CM | POA: Diagnosis not present

## 2021-10-21 DIAGNOSIS — I513 Intracardiac thrombosis, not elsewhere classified: Secondary | ICD-10-CM

## 2021-10-21 LAB — POCT INR: INR: 4.7 — AB (ref 2.0–3.0)

## 2021-10-21 NOTE — Patient Instructions (Signed)
How warfarin tonight and tomorrow night then resume 1 tablet daily except 2 tablets on Mondays and Thursdays.  Continue greens Recheck in 3 weeks -Call [phone] if started on any new medications.

## 2021-11-09 ENCOUNTER — Ambulatory Visit (INDEPENDENT_AMBULATORY_CARE_PROVIDER_SITE_OTHER): Payer: PPO

## 2021-11-09 DIAGNOSIS — I5022 Chronic systolic (congestive) heart failure: Secondary | ICD-10-CM

## 2021-11-10 ENCOUNTER — Ambulatory Visit: Payer: PPO | Attending: Cardiology | Admitting: *Deleted

## 2021-11-10 ENCOUNTER — Other Ambulatory Visit: Payer: Self-pay | Admitting: Cardiology

## 2021-11-10 ENCOUNTER — Other Ambulatory Visit (HOSPITAL_COMMUNITY): Payer: Self-pay | Admitting: Cardiology

## 2021-11-10 DIAGNOSIS — I4821 Permanent atrial fibrillation: Secondary | ICD-10-CM

## 2021-11-10 DIAGNOSIS — Z5181 Encounter for therapeutic drug level monitoring: Secondary | ICD-10-CM

## 2021-11-10 DIAGNOSIS — I513 Intracardiac thrombosis, not elsewhere classified: Secondary | ICD-10-CM

## 2021-11-10 DIAGNOSIS — I4891 Unspecified atrial fibrillation: Secondary | ICD-10-CM

## 2021-11-10 LAB — POCT INR: INR: 4.4 — AB (ref 2.0–3.0)

## 2021-11-10 NOTE — Patient Instructions (Signed)
How warfarin tonight then decrease dose to 1 tablet daily except 2 tablets on Mondays Continue greens Recheck in 3 weeks -Call [phone] if started on any new medications.

## 2021-11-11 ENCOUNTER — Ambulatory Visit (HOSPITAL_COMMUNITY)
Admission: RE | Admit: 2021-11-11 | Discharge: 2021-11-11 | Disposition: A | Discharge status: Home / Self Care | Payer: PPO | Source: Ambulatory Visit | Attending: Cardiology | Admitting: Cardiology

## 2021-11-11 ENCOUNTER — Encounter (HOSPITAL_COMMUNITY): Payer: PPO

## 2021-11-11 ENCOUNTER — Encounter (HOSPITAL_COMMUNITY): Payer: Self-pay

## 2021-11-11 VITALS — BP 138/70 | HR 86 | Wt 132.0 lb

## 2021-11-11 DIAGNOSIS — I428 Other cardiomyopathies: Secondary | ICD-10-CM | POA: Insufficient documentation

## 2021-11-11 DIAGNOSIS — N184 Chronic kidney disease, stage 4 (severe): Secondary | ICD-10-CM | POA: Insufficient documentation

## 2021-11-11 DIAGNOSIS — E1122 Type 2 diabetes mellitus with diabetic chronic kidney disease: Secondary | ICD-10-CM | POA: Insufficient documentation

## 2021-11-11 DIAGNOSIS — Z7901 Long term (current) use of anticoagulants: Secondary | ICD-10-CM | POA: Diagnosis not present

## 2021-11-11 DIAGNOSIS — Z79899 Other long term (current) drug therapy: Secondary | ICD-10-CM | POA: Diagnosis not present

## 2021-11-11 DIAGNOSIS — I5022 Chronic systolic (congestive) heart failure: Secondary | ICD-10-CM | POA: Insufficient documentation

## 2021-11-11 DIAGNOSIS — M109 Gout, unspecified: Secondary | ICD-10-CM | POA: Insufficient documentation

## 2021-11-11 DIAGNOSIS — Z8679 Personal history of other diseases of the circulatory system: Secondary | ICD-10-CM | POA: Insufficient documentation

## 2021-11-11 DIAGNOSIS — R42 Dizziness and giddiness: Secondary | ICD-10-CM | POA: Insufficient documentation

## 2021-11-11 DIAGNOSIS — I4821 Permanent atrial fibrillation: Secondary | ICD-10-CM | POA: Diagnosis not present

## 2021-11-11 DIAGNOSIS — I493 Ventricular premature depolarization: Secondary | ICD-10-CM | POA: Insufficient documentation

## 2021-11-11 DIAGNOSIS — I350 Nonrheumatic aortic (valve) stenosis: Secondary | ICD-10-CM | POA: Insufficient documentation

## 2021-11-11 DIAGNOSIS — I2510 Atherosclerotic heart disease of native coronary artery without angina pectoris: Secondary | ICD-10-CM | POA: Insufficient documentation

## 2021-11-11 DIAGNOSIS — Z7984 Long term (current) use of oral hypoglycemic drugs: Secondary | ICD-10-CM | POA: Diagnosis not present

## 2021-11-11 DIAGNOSIS — Q211 Atrial septal defect, unspecified: Secondary | ICD-10-CM | POA: Insufficient documentation

## 2021-11-11 NOTE — Progress Notes (Signed)
PCP: Ezequiel Essex, MD Cardiology: Dr. Domenic Polite HF Cardiology: Dr. Aundra Dubin EP: Dr. Lovena Le   Reason for Visit: 3 month f/u for chronic systolic heart failure and aortic stenosis   71 y.o. with history of CKD stage IV, permanent atrial fibrillation, and a long-standing nonischemic cardiomyopathy was referred by Dr. Domenic Polite for CHF clinic evaluation.  She first developed cardiac problems in 1988.  At that time, she was admitted with CHF and eventually underwent myocardial biopsy at [facility], per her report showing myocarditis.  Patient states that her EF has been low since then.  She had MDT CRT-D device implanted.  Echo in 7/21 showed EF 20-25%, moderate MR, mild AS. She is now in permanent atrial fibrillation and is s/p AV nodal ablation with BiV pacing. Elevated creatinine has limited medication management.    Echo in 10/22 showed EF 25-30%, severe LV dilation, RV normal, moderate TR, mod-severe AS with mean gradient 9 mmHg but AVA 0.76 cm^2 (?low gradient severe AS), mild-moderate MR, left=>right shunt possible ASD.  TEE done in 10/22 to more closely assess AS.  This showed EF 30%, moderate LV dilation, normal RV, possible calcified vegetation on the ICD lead, small secundum ASD, peak RV-RA gradient 23 mmHg, low flow/low gradient moderate AS, mild-moderate MR. RHC/LHC was done, showing mild nonobstructive CAD, left to right atrial level shunt with Qp/Qs 1.43.  I reviewed TEE and cath report with Dr. Burt Knack.  We decided that AS was not severe enough yet for TAVR and that the ASD was not significant enough to close percutaneously. With possible calcified vegetation on ICD, we drew blood cultures which were negative.  She saw ID and had a PET-CT, which did not suggest active endocarditis.   Patient was found on Zio patch in 10/22 to have 12.6% PVCs, this was thought to be limiting her BiV pacing.  She has a history of intolerance of amiodarone so was started on mexiletine.  She says that this caused a rash  so she stopped it and rash resolved. She also decreased Toprol XL to 100 mg daily due to nightmares when taking 150 mg daily.  She presents today for 3 month f/u. Reports doing well. Denies CP. Stable NYHA class II symptoms. Reports full med compliance. Notes positional dizziness first thing in the mornings, but improves. Denies syncope/near syncope. Denies abnormal bleeding w/ coumadin. Device interrogation shows stable thoracic impedence. No fluid accumulation. 98% BiV pacing. No VT/VF. She continues to have nightmares, which she attributes to metoprolol. Recently self reduced dose to 75 mg daily.   She continues to follow with Dr. Moshe Cipro for nephrology. Says she recently had labs drawn at her office.   Labs (1/21): LDL 118 Labs (9/21): K 4.4, creatinine 1.93 Labs (12/21): K 4, creatinine 1.77 Labs (2/22): TSH normal Labs (5/22): K 5, creatinine 2.42 Labs (9/22): K 4.3, creatinine 1.73, LDL 90 Labs (10/22): K 4.2, creatinine 2.18 Labs (2/23): K 3.8, creatinine 2.42  Medtronic device interrogation: Bi V pacing 98%. Fluid index < threshold   PMH: 1. Gout 2. Complete heart block: Medtronic CRT device.  3. CKD stage IV: Diabetic nephropathy.  4. Type 2 diabetes 5. Atrial fibrillation: Now permanent.   - AF ablation 4/18.  - AV nodal ablation in 2019 with MDT CRT-D.  6. Remote LV thrombus 7. Chronic systolic CHF: Nonischemic cardiomyopathy. Medtronic CRT-D device.  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  - LHC 2004: Nonobstructive mild CAD.  - Echo (8/19): EF 10-15%,  mildly dilated LV, mild-moderately dilated RV with mild-moderately decreased systolic function.  - Echo (7/21): EF 20-25%, low normal RV function, moderate MR, mild AS.  - Echo (10/22): EF 25-30%, severe LV dilation, RV normal, moderate TR, mod-severe AS with mean gradient 9 mmHg but AVA 0.76 cm^2 (?low gradient severe AS), mild-moderate MR, left=>right shunt possible ASD.  - RHC/LHC  (10/22): mild nonobstructive CAD, left to right atrial level shunt with Qp/Qs 1.43.  8. H/o cholecystectomy 9. Hypothyroidism 10. 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 Aortic stenosis: TEE (10/22) showed EF 30%, moderate LV dilation, normal RV, possible calcified vegetation on the ICD lead, small secundum ASD, peak RV-RA gradient 23 mmHg, low flow/low gradient moderate AS, mild-moderate MR.  13. 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  34. 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  - RHC in 10/22 showed atrial level shunt with Qp/Qs 1.43.  15. PVCs: Zio monitor (10/22) with 12.6% PVCs. - Intolerant of both amiodarone and mexiletine.   SH: Married, lives in [place].  No smoking or ETOH.   Family History  Problem Relation Age of Onset   Hip fracture Mother    COPD Father    CAD Sister    Heart attack Sister    ROS: All systems reviewed and negative except as per HPI.   Current Outpatient Medications  Medication Sig Dispense Refill   acetaminophen (TYLENOL) 500 MG tablet Take 1,000 mg by mouth every 6 (six) hours as needed for mild pain.     allopurinol (ZYLOPRIM) 100 MG tablet Take 100-200 mg by mouth See admin instructions. Take 200 mg in the morning 100 mg at bedtime     Cinnamon 500 MG capsule Take 500 mg by mouth daily.     diclofenac Sodium (VOLTAREN) 1 % GEL Apply 4 g topically 4 (four) times daily. 350 g 1   ENTRESTO 24-26 MG TAKE (1) TABLET TWICE DAILY. 60 tablet 6   furosemide (LASIX) 80 MG tablet TAKE 1 TABLET BY MOUTH EVERY MORNING. 90 tablet 3   Glucosamine-Chondroitin (OSTEO BI-FLEX REGULAR STRENGTH PO) Take 1 tablet by mouth daily.     JARDIANCE 10 MG TABS tablet TAKE 1 TABLET ONCE A DAY BEFORE BREAKFAST. 30 tablet 11   levothyroxine (SYNTHROID) 88 MCG tablet TAKE 1 TABLET BY MOUTH DAILY BEFORE BREAKFAST. 30 tablet 11   loratadine (CLARITIN) 10 MG tablet Take 10 mg by mouth daily.     metoprolol  succinate (TOPROL-XL) 100 MG 24 hr tablet Take 100 mg by mouth daily. Take with or immediately following a meal. Alternating with 75 mg     Omega-3 Fatty Acids (FISH OIL) 1200 MG CPDR Take 1,200 mg by mouth daily.     omeprazole (PRILOSEC) 40 MG capsule TAKE 1 CAPSULE BY MOUTH ONCE DAILY. 90 capsule 3   simvastatin (ZOCOR) 20 MG tablet TAKE 1 TABLET EVERY DAY AT 6 PM 90 tablet 3   spironolactone (ALDACTONE) 25 MG tablet Take 0.5 tablets (12.5 mg total) by mouth daily. 45 tablet 3   TOUJEO SOLOSTAR 300 UNIT/ML Solostar Pen INJECT 24 UNITS INTO THE SKIN EVERY DAY 4.5 mL 5   warfarin (COUMADIN) 2 MG tablet TAKE 1 TO 2 TABLETS ONCE A DAY AS DIRECTED BY ANTICOAGULATION. 45 tablet 0   No current facility-administered medications for this visit.   There were no vitals taken for this visit. Wt Readings from Last 3 Encounters:  08/10/21 61.4 kg (135 lb 6.4 oz)  07/15/21 62.3 kg (137 lb 6.4 oz)  07/01/21 61.4 kg (135 lb 6.4 oz)   PHYSICAL EXAM: General:  Well appearing. No respiratory difficulty HEENT: normal Neck: supple. no JVD. Carotids 2+ bilat; no bruits. No lymphadenopathy or thyromegaly appreciated. Cor: PMI nondisplaced. Regular rate & rhythm. 2/6 AS murmur RUSB  Lungs: clear Abdomen: soft, nontender, nondistended. No hepatosplenomegaly. No bruits or masses. Good bowel sounds. Extremities: no cyanosis, clubbing, rash, edema Neuro: alert & oriented x 3, cranial nerves grossly intact. moves all 4 extremities w/o difficulty. Affect pleasant.   Assessment/Plan: 1. Chronic systolic CHF: Nonischemic cardiomyopathy.  Low EF for years.  Per patient's report, she had an endomyocardial biopsy at [facility] in 1988 showing myocarditis.  Medication titration has been limited by CKD stage IV. Has MDT CRT-D device. Echo in 7/21 showed EF 20-25%, moderate MR, mild AS.  Echo 12/17/20 showed EF 25-30%, severe LV dilation, RV normal, moderate TR, mod-severe AS with mean gradient 9 mmHg but AVA 0.76 cm^2.  TEE in  10/22 with EF 30%.  LHC in 10/22 with mild nonobstructive CAD. She is BiV pacing 98% of the time.  By exam and Optivol, she is not volume overloaded. No VT. Stable NYHA class II symptoms.   - Continue Lasix 80 mg daily.   - Continue Spiro 12.5 mg daily. - Continue Jardiance 10 mg daily.  - Continue Entresto 24/26 bid. No dose titration given positional dizziness  - Continue Toprol XL 75 mg daily, cannot tolerate higher dose due to nightmares   - Update 2D Echo  2.  Atrial fibrillation: Permanent.  S/p AV nodal ablation with BiV pacing.   - Continue warfarin.  INRs followed by [facility]. Denies abnormal bleeding  4. PVCs: Zio 10/22 with 12.6% PVCs. Suspect fewer now as BiV pacing percentage up to 98%.  - Continue Toprol XL 75 mg daily.  - Unable to tolerate amiodarone in the past.  - She had a rash with mexiletine and stopped it.  5. Aortic stenosis: TEE in 10/22 showed low flow/low gradient moderate aortic stenosis.  Discussed with Dr. Burt Knack, at that point was not felt to need TAVR at the time  - Will repeat Echo to assess for interval change in severity  6. ASD: Patient has an ASD by echo with L=>R shunting, small.  Qp/Qs by RHC was 1.43. Reviewed with Dr Burt Knack, do not think repair indicated at this point.  7. Gout: Stable.  8. Calcified device lead vegetation: Blood cultures negative, PET-CT in 11/22 not suggestive of active endocarditis.   Pt reports had recent BMP done at nephrology office. Will contact them to fax results.    Followup in 3 months with Dr. Aundra Dubin.    Lyda Jester, PA-C  11/11/2021

## 2021-11-11 NOTE — Patient Instructions (Signed)
No Labs done today.  No medication changes were made. Please continue all current medications as prescribed.  Your physician recommends that you schedule a follow-up appointment in: 3 month follow up with an echo prior to your exam.  Your physician has requested that you have an echocardiogram. Echocardiography is a painless test that uses sound waves to create images of your heart. It provides your doctor with information about the size and shape of your heart and how well your heart's chambers and valves are working. This procedure takes approximately one hour. There are no restrictions for this procedure.  If you have any questions or concerns before your next appointment please send [place] a message through [place] or call our office at [phone].    TO LEAVE A MESSAGE FOR THE NURSE SELECT OPTION 2, PLEASE LEAVE A MESSAGE INCLUDING: YOUR NAME DATE OF BIRTH CALL BACK NUMBER REASON FOR CALL**this is important as we prioritize the call backs  YOU WILL RECEIVE A CALL BACK THE SAME DAY AS LONG AS YOU CALL BEFORE 4:00 PM   Do the following things EVERYDAY: Weigh yourself in the morning before breakfast. Write it down and keep it in a log. Take your medicines as prescribed Eat low salt foods--Limit salt (sodium) to 2000 mg per day.  Stay as active as you can everyday Limit all fluids for the day to less than 2 liters   At the [facility], you and your health needs are our priority. As part of our continuing mission to provide you with exceptional heart care, we have created designated Provider Care Teams. These Care Teams include your primary Cardiologist (physician) and Advanced Practice Providers (APPs- Physician Assistants and Nurse Practitioners) who all work together to provide you with the care you need, when you need it.   You may see any of the following providers on your designated Care Team at your next follow up: Dr Glori Bickers Dr Haynes Kerns,  NP Lyda Jester, [place] Audry Riles, PharmD   Please be sure to bring in all your medications bottles to every appointment.

## 2021-11-12 ENCOUNTER — Encounter (HOSPITAL_COMMUNITY): Payer: PPO

## 2021-11-12 LAB — CUP PACEART REMOTE DEVICE CHECK
Battery Remaining Longevity: 103 mo
Battery Voltage: 3.05 V
Brady Statistic AP VP Percent: 0 %
Brady Statistic AP VS Percent: 0 %
Brady Statistic AS VP Percent: 0 %
Brady Statistic AS VS Percent: 0 %
Brady Statistic RA Percent Paced: 0 %
Brady Statistic RV Percent Paced: 98.79 %
Date Time Interrogation Session: 20230912033424
HighPow Impedance: 65 Ohm
Implantable Lead Implant Date: 20050218
Implantable Lead Implant Date: 20050218
Implantable Lead Implant Date: 20050218
Implantable Lead Location: 753858
Implantable Lead Location: 753859
Implantable Lead Location: 753860
Implantable Lead Model: 4194
Implantable Lead Model: 5076
Implantable Lead Model: 6947
Implantable Pulse Generator Implant Date: 20230313
Lead Channel Impedance Value: 342 Ohm
Lead Channel Impedance Value: 342 Ohm
Lead Channel Impedance Value: 399 Ohm
Lead Channel Impedance Value: 589 Ohm
Lead Channel Impedance Value: 646 Ohm
Lead Channel Impedance Value: 855 Ohm
Lead Channel Pacing Threshold Amplitude: 1.125 V
Lead Channel Pacing Threshold Amplitude: 2.125 V
Lead Channel Pacing Threshold Pulse Width: 0.4 ms
Lead Channel Pacing Threshold Pulse Width: 0.4 ms
Lead Channel Sensing Intrinsic Amplitude: 1 mV
Lead Channel Sensing Intrinsic Amplitude: 5 mV
Lead Channel Sensing Intrinsic Amplitude: 5 mV
Lead Channel Setting Pacing Amplitude: 2.25 V
Lead Channel Setting Pacing Amplitude: 2.25 V
Lead Channel Setting Pacing Pulse Width: 0.4 ms
Lead Channel Setting Pacing Pulse Width: 0.4 ms
Lead Channel Setting Sensing Sensitivity: 0.3 mV

## 2021-11-15 ENCOUNTER — Ambulatory Visit (INDEPENDENT_AMBULATORY_CARE_PROVIDER_SITE_OTHER): Payer: PPO

## 2021-11-15 DIAGNOSIS — I5022 Chronic systolic (congestive) heart failure: Secondary | ICD-10-CM

## 2021-11-15 DIAGNOSIS — Z95810 Presence of automatic (implantable) cardiac defibrillator: Secondary | ICD-10-CM

## 2021-11-17 NOTE — Progress Notes (Signed)
EPIC Encounter for ICM Monitoring  Patient Name: Emily [name] is a 71 y.o. female Date: 11/17/2021 Primary Care Physican: Ezequiel Essex, MD Primary Cardiologist: [place] Electrophysiologist: Santina Evans Pacing: 97.5%  09/09/2021 Weight: 133-134 lbs 11/17/2021 Weight:  132 lbs  VT-NS (>4 beats, >150 bpm) 1           ASpoke with patient and heart failure questions reviewed.  Pt asymptomatic for fluid accumulation.  Reports feeling well at this time and voices no complaints.          Optivol Thoracic impedance suggesting normal fluid levels.    Prescribed: Furosemide 80 mg Take 1 tablet (80 mg total) by mouth every morning.  She self adjusts Furosemide as needed.    Labs: 07/29/2021 Creatinine 1.80, BUN 25, Potassium 4.2, Sodium 140, GFR 30 07/15/2021 Creatinine 1.64, BUN 17, Potassium 3.3, Sodium 140  A complete set of results can be found in Results Review.   Recommendations:  No changes and encouraged to call if experiencing any fluid symptoms.   Follow-up plan: ICM clinic phone appointment on 12/20/2021.  91 day device clinic remote transmission 02/08/2022.      EP/Cardiology Office Visits:  Recall 08/05/2022 with Dr Lovena Le.  02/10/2022 with Dr Aundra Dubin.       Copy of ICM check sent to Dr. Lovena Le.   3 month ICM trend: 11/17/2021.    12-14 Month ICM trend:     [name], RN 11/17/2021 3:37 PM

## 2021-11-25 NOTE — Progress Notes (Signed)
Remote ICD transmission.   

## 2021-12-01 ENCOUNTER — Ambulatory Visit: Payer: PPO | Attending: Cardiology | Admitting: *Deleted

## 2021-12-01 DIAGNOSIS — I4891 Unspecified atrial fibrillation: Secondary | ICD-10-CM | POA: Diagnosis not present

## 2021-12-01 DIAGNOSIS — I513 Intracardiac thrombosis, not elsewhere classified: Secondary | ICD-10-CM | POA: Diagnosis not present

## 2021-12-01 DIAGNOSIS — Z5181 Encounter for therapeutic drug level monitoring: Secondary | ICD-10-CM

## 2021-12-01 LAB — POCT INR: INR: 3.5 — AB (ref 2.0–3.0)

## 2021-12-01 NOTE — Patient Instructions (Signed)
How warfarin tonight then decrease dose to 1 tablet daily  Continue greens Recheck in 3 weeks -Call [phone] if started on any new medications.

## 2021-12-13 NOTE — Progress Notes (Deleted)
    SUBJECTIVE:   CHIEF COMPLAINT / HPI:   *** T2DM  Lab Results  Component Value Date   HGBA1C 9.9 (A) 07/01/2021   HGBA1C 8.8 (A) 11/06/2020   HGBA1C 9.9 (A) 12/31/2019   Lab Results  Component Value Date   MICROALBUR 10 12/01/2017   LDLCALC 90 11/10/2020   CREATININE 1.80 (H) 07/29/2021    BP  BP Readings from Last 3 Encounters:  11/11/21 138/70  08/10/21 126/74  07/15/21 102/60    HLD   Lab Results  Component Value Date   CHOL 156 11/10/2020   HDL 34 (L) 11/10/2020   LDLCALC 90 11/10/2020   TRIG 186 (H) 11/10/2020   CHOLHDL 4.6 (H) 11/10/2020   Thyroid Last TSH 0.456 in June   Health Maintenance Due for flu vaccine, pneumonia, shingles, Tdap diabetes Last ophthalmology exam in 2020  PERTINENT  PMH / PSH: ***  OBJECTIVE:   There were no vitals taken for this visit.  ***  ASSESSMENT/PLAN:   No problem-specific Assessment & Plan notes found for this encounter.     Ezequiel Essex, MD [employer]

## 2021-12-14 ENCOUNTER — Other Ambulatory Visit: Payer: Self-pay

## 2021-12-14 ENCOUNTER — Ambulatory Visit: Payer: PPO | Admitting: Family Medicine

## 2021-12-14 ENCOUNTER — Emergency Department (HOSPITAL_COMMUNITY)
Admission: EM | Admit: 2021-12-14 | Discharge: 2021-12-15 | Discharge status: Against Medical Advice | Payer: PPO | Attending: Emergency Medicine | Admitting: Emergency Medicine

## 2021-12-14 ENCOUNTER — Other Ambulatory Visit: Payer: Self-pay | Admitting: Cardiology

## 2021-12-14 ENCOUNTER — Emergency Department (HOSPITAL_COMMUNITY): Payer: PPO

## 2021-12-14 ENCOUNTER — Encounter (HOSPITAL_COMMUNITY): Payer: Self-pay | Admitting: Emergency Medicine

## 2021-12-14 DIAGNOSIS — R0789 Other chest pain: Secondary | ICD-10-CM | POA: Diagnosis present

## 2021-12-14 DIAGNOSIS — Z1159 Encounter for screening for other viral diseases: Secondary | ICD-10-CM

## 2021-12-14 DIAGNOSIS — Z950 Presence of cardiac pacemaker: Secondary | ICD-10-CM | POA: Diagnosis not present

## 2021-12-14 DIAGNOSIS — R0602 Shortness of breath: Secondary | ICD-10-CM | POA: Diagnosis not present

## 2021-12-14 DIAGNOSIS — E7800 Pure hypercholesterolemia, unspecified: Secondary | ICD-10-CM

## 2021-12-14 DIAGNOSIS — Z5321 Procedure and treatment not carried out due to patient leaving prior to being seen by health care provider: Secondary | ICD-10-CM | POA: Insufficient documentation

## 2021-12-14 DIAGNOSIS — I509 Heart failure, unspecified: Secondary | ICD-10-CM | POA: Insufficient documentation

## 2021-12-14 DIAGNOSIS — N184 Chronic kidney disease, stage 4 (severe): Secondary | ICD-10-CM

## 2021-12-14 LAB — BASIC METABOLIC PANEL
Anion gap: 17 — ABNORMAL HIGH (ref 5–15)
BUN: 33 mg/dL — ABNORMAL HIGH (ref 8–23)
CO2: 21 mmol/L — ABNORMAL LOW (ref 22–32)
Calcium: 8.2 mg/dL — ABNORMAL LOW (ref 8.9–10.3)
Chloride: 93 mmol/L — ABNORMAL LOW (ref 98–111)
Creatinine, Ser: 2.11 mg/dL — ABNORMAL HIGH (ref 0.44–1.00)
GFR, Estimated: 24 mL/min — ABNORMAL LOW (ref >60)
Glucose, Bld: 502 mg/dL (ref 70–99)
Potassium: 3.9 mmol/L (ref 3.5–5.1)
Sodium: 131 mmol/L — ABNORMAL LOW (ref 135–145)

## 2021-12-14 LAB — CBC
HCT: 35.8 % — ABNORMAL LOW (ref 36.0–46.0)
Hemoglobin: 12.4 g/dL (ref 12.0–15.0)
MCH: 32.3 pg (ref 26.0–34.0)
MCHC: 34.6 g/dL (ref 30.0–36.0)
MCV: 93.2 fL (ref 80.0–100.0)
Platelets: 248 10*3/uL (ref 150–400)
RBC: 3.84 MIL/uL — ABNORMAL LOW (ref 3.87–5.11)
RDW: 15.7 % — ABNORMAL HIGH (ref 11.5–15.5)
WBC: 10.2 10*3/uL (ref 4.0–10.5)
nRBC: 0 % (ref 0.0–0.2)

## 2021-12-14 LAB — TROPONIN I (HIGH SENSITIVITY)
Troponin I (High Sensitivity): 14 ng/L (ref <18)
Troponin I (High Sensitivity): 16 ng/L (ref <18)

## 2021-12-14 NOTE — ED Triage Notes (Signed)
Patient reports central chest pain with SOB onset Sunday this week with SOB , no emesis nor diaphoresis , history of CHF with pacemaker/defibrillator , her cardiologist is Dr. Trey Paula .

## 2021-12-14 NOTE — ED Provider Triage Note (Signed)
Emergency Medicine Provider Triage Evaluation Note  Emily [name] , a 71 y.o. female  was evaluated in triage.  Pt complains of central chest pain. Started Sunday morning, eased off, then came back again this afternoon. Hurts to breathe. Wasn't doing anything strenuous when it came on. Hx of CHF, afib s/p ablation and ICD placement  Review of Systems  Positive: CP, chills Negative: SOB, cough, fever, leg pain/swelling  Physical Exam  BP 136/86 (BP Location: Right Arm)   Pulse 64   Temp 98.6 F (37 C) (Oral)   Resp 20   SpO2 100%  Gen:   Awake, no distress   Resp:  Normal effort  MSK:   Moves extremities without difficulty  Other:    Medical Decision Making  Medically screening exam initiated at 8:18 PM.  Appropriate orders placed.  Emily [name] was informed that the remainder of the evaluation will be completed by another provider, this initial triage assessment does not replace that evaluation, and the importance of remaining in the ED until their evaluation is complete.  Workup initiated   Willisha Sligar T, PA-C 12/14/21 2019

## 2021-12-15 ENCOUNTER — Telehealth (HOSPITAL_COMMUNITY): Payer: Self-pay | Admitting: Cardiology

## 2021-12-15 MED ORDER — NITROGLYCERIN 0.4 MG SL SUBL: 0.4000 mg | SUBLINGUAL_TABLET | SUBLINGUAL | 3 refills | Status: DC | PRN

## 2021-12-15 NOTE — Telephone Encounter (Signed)
Spoke with Daughter this am. CP started at 3 pm yesterday, no nitro taken. Labs and X-ray done in ED yesterday. Forwarded to NP to review

## 2021-12-15 NOTE — ED Notes (Signed)
X1 no response 

## 2021-12-15 NOTE — ED Notes (Signed)
X2 no response °

## 2021-12-15 NOTE — Telephone Encounter (Addendum)
Called daughter back to inform her that we received the transmission and that her fluid levels were good and that we were not changing any medications. Dr. Aundra Dubin also informed   Updated daughter on advice from [facility] FNP.  Script for [employer] sent Echo rescheduled for 12/22/21, Transmission getting sent in, once received will have Janett Billow review and will call the daughter back if any medication changes are needed.

## 2021-12-15 NOTE — Telephone Encounter (Signed)
Pt called yesterday c/o chest pains/SOB went to ER last night was there for 9 hours, requesting to see Dr today, please advise

## 2021-12-20 ENCOUNTER — Ambulatory Visit (INDEPENDENT_AMBULATORY_CARE_PROVIDER_SITE_OTHER): Payer: PPO

## 2021-12-20 DIAGNOSIS — I5022 Chronic systolic (congestive) heart failure: Secondary | ICD-10-CM | POA: Diagnosis not present

## 2021-12-20 DIAGNOSIS — Z95810 Presence of automatic (implantable) cardiac defibrillator: Secondary | ICD-10-CM | POA: Diagnosis not present

## 2021-12-20 NOTE — Progress Notes (Signed)
Received: Today Emily [name], Emily Bo, FNP  Kourtnei Rauber Panda, RN Thank you Margarita Grizzle, agree with extra 80 mg Lasix today. If CP recurs, need to go to ED.

## 2021-12-20 NOTE — Progress Notes (Signed)
EPIC Encounter for ICM Monitoring  Patient Name: Emily [name] is a 71 y.o. female Date: 12/20/2021 Primary Care Physican: Ezequiel Essex, MD Primary Cardiologist: [place] Electrophysiologist: Santina Evans Pacing: 96.4%  09/09/2021 Weight: 133-134 lbs 11/17/2021 Weight:  132 lbs   VT-NS (>4 beats, >150 bpm)    1           Spoke with patient and heart failure questions reviewed.  Transmission results reviewed.  Pt reports taking Nitro last night, 10/22 but did not feel like it relieved pain.  She is not having any chest pain today.   ED visit on 10/17 for CP but she left after waiting about 7 hours.  She is not having any fluid symptoms.         Optivol Thoracic impedance suggesting possible fluid accumulation starting 10/14.    Prescribed: Furosemide 80 mg Take 1 tablet (80 mg total) by mouth every morning.  She self adjusts Furosemide as needed.  Spironolactone 25 mg take 0.5 tablet by mouth daily   Labs: 12/14/2021 Creatinine 2.11, BUN 33, Potassium 3.9, Sodium 131, GFR 24, Glucose 502 07/29/2021 Creatinine 1.80, BUN 25, Potassium 4.2, Sodium 140, GFR 30 07/15/2021 Creatinine 1.64, BUN 17, Potassium 3.3, Sodium 140  A complete set of results can be found in Results Review.   Recommendations:  Pt reports she will take extra Furosemide 80 mg this evening.   Advised if chest pain recurs to call 911 if needed.  Copy sent to Allena Katz, NP at [facility] clinic for review and recommendations if needed.     Follow-up plan: ICM clinic phone appointment on 12/27/2021 (manual).  91 day device clinic remote transmission 02/08/2022.      EP/Cardiology Office Visits:  Recall 08/05/2022 with Dr Lovena Le.  02/10/2022 with Dr Aundra Dubin.       Copy of ICM check sent to Dr. Lovena Le.    3 month ICM trend: 12/20/2021.    12-14 Month ICM trend:     [name], RN 12/20/2021 6:36 AM

## 2021-12-22 ENCOUNTER — Ambulatory Visit (HOSPITAL_COMMUNITY)
Admission: RE | Admit: 2021-12-22 | Discharge: 2021-12-22 | Disposition: A | Discharge status: Home / Self Care | Payer: PPO | Source: Ambulatory Visit | Attending: Cardiology | Admitting: Cardiology

## 2021-12-22 DIAGNOSIS — Q2111 Secundum atrial septal defect: Secondary | ICD-10-CM | POA: Diagnosis not present

## 2021-12-22 DIAGNOSIS — Q210 Ventricular septal defect: Secondary | ICD-10-CM | POA: Insufficient documentation

## 2021-12-22 DIAGNOSIS — I350 Nonrheumatic aortic (valve) stenosis: Secondary | ICD-10-CM

## 2021-12-22 DIAGNOSIS — I083 Combined rheumatic disorders of mitral, aortic and tricuspid valves: Secondary | ICD-10-CM | POA: Insufficient documentation

## 2021-12-22 DIAGNOSIS — I442 Atrioventricular block, complete: Secondary | ICD-10-CM | POA: Insufficient documentation

## 2021-12-22 DIAGNOSIS — I429 Cardiomyopathy, unspecified: Secondary | ICD-10-CM | POA: Diagnosis not present

## 2021-12-22 DIAGNOSIS — E785 Hyperlipidemia, unspecified: Secondary | ICD-10-CM | POA: Insufficient documentation

## 2021-12-22 DIAGNOSIS — N189 Chronic kidney disease, unspecified: Secondary | ICD-10-CM | POA: Diagnosis not present

## 2021-12-22 DIAGNOSIS — I352 Nonrheumatic aortic (valve) stenosis with insufficiency: Secondary | ICD-10-CM | POA: Diagnosis present

## 2021-12-22 LAB — ECHOCARDIOGRAM COMPLETE
AR max vel: 1.3 cm2
AV Area VTI: 1.27 cm2
AV Area mean vel: 1.23 cm2
AV Mean grad: 9 mmHg
AV Peak grad: 15.2 mmHg
Ao pk vel: 1.95 m/s
Area-P 1/2: 2.9 cm2
Calc EF: 30.5 %
MV VTI: 2.21 cm2
Single Plane A2C EF: 32.9 %
Single Plane A4C EF: 27.4 %

## 2021-12-22 MED ORDER — PERFLUTREN LIPID MICROSPHERE
1.0000 mL | INTRAVENOUS | Status: AC | PRN
  Administered 2021-12-22: 4 mL via INTRAVENOUS

## 2021-12-27 ENCOUNTER — Encounter (HOSPITAL_COMMUNITY): Payer: Self-pay | Admitting: *Deleted

## 2021-12-27 NOTE — Progress Notes (Unsigned)
    SUBJECTIVE:   CHIEF COMPLAINT / HPI:   T2DM  Lab Results  Component Value Date   HGBA1C 9.9 (A) 07/01/2021   HGBA1C 8.8 (A) 11/06/2020   HGBA1C 9.9 (A) 12/31/2019   Lab Results  Component Value Date   MICROALBUR 10 12/01/2017   LDLCALC 90 11/10/2020   CREATININE 2.11 (H) 12/14/2021    BP Readings from Last 3 Encounters:  12/15/21 (!) 109/56  11/11/21 138/70  08/10/21 126/74    Lab Results  Component Value Date   CHOL 156 11/10/2020   HDL 34 (L) 11/10/2020   LDLCALC 90 11/10/2020   TRIG 186 (H) 11/10/2020   CHOLHDL 4.6 (H) 11/10/2020    PERTINENT  PMH / PSH: ***  OBJECTIVE:   There were no vitals taken for this visit.  ***  ASSESSMENT/PLAN:   No problem-specific Assessment & Plan notes found for this encounter.     Ezequiel Essex, MD [place]

## 2021-12-27 NOTE — Patient Instructions (Signed)
It was wonderful to see you today. Thank you for allowing me to be a part of your care. Below is a short summary of what we discussed at your visit today:  Diabetes We checked your A1c today and obtained a urine sample to check and see if there is any protein in your urine.   I have referred you to ophthalmology for a routine diabetic eye exam.  Someone from their office should be calling you in 1 to 2 weeks to schedule an appointment.  If you do not hear from them, let us know. We may need to nudge along the referral.    *** ***  Health Maintenance We like to think about ways to keep you healthy for years to come. Below are some interventions and screenings we can offer to keep you healthy: -Flu vaccine (available here in clinic) -Tdap vaccine (available at your pharmacy) -Shingles vaccine (available at your pharmacy) -Pneumococcal pneumonia vaccine (available at your pharmacy) -Medicare annual wellness visit (scheduled with our nurse in clinic)  Please bring all of your medications to every appointment!  If you have any questions or concerns, please do not hesitate to contact us via phone or MyChart message.   Ezequiel Essex, MD

## 2021-12-28 ENCOUNTER — Telehealth (HOSPITAL_COMMUNITY): Payer: Self-pay | Admitting: Cardiology

## 2021-12-28 ENCOUNTER — Encounter: Payer: Self-pay | Admitting: Family Medicine

## 2021-12-28 ENCOUNTER — Ambulatory Visit (INDEPENDENT_AMBULATORY_CARE_PROVIDER_SITE_OTHER): Payer: PPO | Admitting: Family Medicine

## 2021-12-28 ENCOUNTER — Ambulatory Visit (HOSPITAL_COMMUNITY)
Admission: RE | Admit: 2021-12-28 | Discharge: 2021-12-28 | Disposition: A | Discharge status: Home / Self Care | Payer: PPO | Source: Ambulatory Visit | Attending: Family Medicine | Admitting: Family Medicine

## 2021-12-28 VITALS — BP 106/68 | HR 43 | Ht 62.0 in | Wt 132.0 lb

## 2021-12-28 DIAGNOSIS — R079 Chest pain, unspecified: Secondary | ICD-10-CM | POA: Insufficient documentation

## 2021-12-28 DIAGNOSIS — N184 Chronic kidney disease, stage 4 (severe): Secondary | ICD-10-CM

## 2021-12-28 DIAGNOSIS — Z794 Long term (current) use of insulin: Secondary | ICD-10-CM | POA: Diagnosis not present

## 2021-12-28 DIAGNOSIS — E1122 Type 2 diabetes mellitus with diabetic chronic kidney disease: Secondary | ICD-10-CM | POA: Diagnosis not present

## 2021-12-28 LAB — POCT GLYCOSYLATED HEMOGLOBIN (HGB A1C): HbA1c, POC (controlled diabetic range): 11.3 % — AB (ref 0.0–7.0)

## 2021-12-28 LAB — GLUCOSE, POCT (MANUAL RESULT ENTRY): POC Glucose: 442 mg/dl — AB (ref 70–99)

## 2021-12-28 NOTE — Telephone Encounter (Signed)
Pt on phone for echo results, are you able to talk to her, she stated she left several messages, and is having chest pains

## 2021-12-28 NOTE — Assessment & Plan Note (Addendum)
Subacute and intermittent, now 2 weeks in duration.  Most concerning to me is patient's chest pain radiating to left shoulder and neck.  At today's clinic visit, bradycardia to 43, low-normal blood pressure, drastically increased A1c, and fingerstick glucose 442 all quite concerning.  Precepted case with Dr. Erin Hearing. EKG in clinic today unchanged from ED EKG 10/17, confirmed by Dr. Andria Frames.  Given unchanged EKG and patient's reluctance to return to the emergency room, used shared decision making to determine she could avoid the ED and go to her cardiology appointment tomorrow morning so long as the chest pain did not change or worsen.  EMS and ED precautions given.  Patient verbalized understanding.  Diabetic complication? Given elevated A1c and fingerstick glucose, paired with concerning BMP in 10/17, wonder if we are seeing an HHS picture vs non-compliance. We will collect CMP, UA.  Note we expect glucosuria given Jardiance. Patient to continue Jardiance and Toujeo 24 units.  Pneumonia? Less likely given clear CXR 10/17, clear lung exam today, and lack of fever or URI symptoms.  Pulmonary embolism? Patient reports worsening pain with inspiration.  However less likely given she is anti-coagulated on warfarin, bradycardic, with normal SPO2.  Thyroid complication? Patient seems to be taking levothyroxine 88 mcg daily, will obtain TSH.   MSK such as costochondritis? Unlikely given lack of reproducibility palpation and radiation to shoulder/neck.   Aortic dissection?  Patient does not endorse tearing chest pain, does not radiate to the back. Stable BP from previous visits. ECHO on 10/25 shows normal aortic root, but obviously is not a dissection study. Could consider study if she presents to ED and has suggestive s/s.

## 2021-12-28 NOTE — Telephone Encounter (Signed)
Spoke with pt she is aware of echo results and she was advised to go to the emergency room for chest pain.

## 2021-12-29 ENCOUNTER — Emergency Department (HOSPITAL_COMMUNITY): Payer: PPO

## 2021-12-29 ENCOUNTER — Other Ambulatory Visit: Payer: Self-pay

## 2021-12-29 ENCOUNTER — Telehealth: Payer: Self-pay | Admitting: Cardiology

## 2021-12-29 ENCOUNTER — Emergency Department (HOSPITAL_COMMUNITY)
Admission: EM | Admit: 2021-12-29 | Discharge: 2021-12-29 | Disposition: A | Discharge status: Home / Self Care | Payer: PPO | Attending: Emergency Medicine | Admitting: Emergency Medicine

## 2021-12-29 ENCOUNTER — Ambulatory Visit: Payer: PPO | Attending: Internal Medicine | Admitting: *Deleted

## 2021-12-29 ENCOUNTER — Encounter (HOSPITAL_COMMUNITY): Payer: Self-pay

## 2021-12-29 DIAGNOSIS — I4891 Unspecified atrial fibrillation: Secondary | ICD-10-CM | POA: Diagnosis not present

## 2021-12-29 DIAGNOSIS — I513 Intracardiac thrombosis, not elsewhere classified: Secondary | ICD-10-CM

## 2021-12-29 DIAGNOSIS — Z20822 Contact with and (suspected) exposure to covid-19: Secondary | ICD-10-CM | POA: Insufficient documentation

## 2021-12-29 DIAGNOSIS — R059 Cough, unspecified: Secondary | ICD-10-CM | POA: Diagnosis not present

## 2021-12-29 DIAGNOSIS — Z7901 Long term (current) use of anticoagulants: Secondary | ICD-10-CM | POA: Diagnosis not present

## 2021-12-29 DIAGNOSIS — J90 Pleural effusion, not elsewhere classified: Secondary | ICD-10-CM

## 2021-12-29 DIAGNOSIS — R072 Precordial pain: Secondary | ICD-10-CM | POA: Diagnosis present

## 2021-12-29 DIAGNOSIS — R911 Solitary pulmonary nodule: Secondary | ICD-10-CM

## 2021-12-29 DIAGNOSIS — Z5181 Encounter for therapeutic drug level monitoring: Secondary | ICD-10-CM | POA: Diagnosis not present

## 2021-12-29 LAB — URINALYSIS, ROUTINE W REFLEX MICROSCOPIC
Bilirubin Urine: NEGATIVE
Glucose, UA: >=500 mg/dL — AB
Hgb urine dipstick: NEGATIVE
Ketones, ur: NEGATIVE mg/dL
Nitrite: NEGATIVE
Protein, ur: NEGATIVE mg/dL
Specific Gravity, Urine: 1.009 (ref 1.005–1.030)
pH: 5 (ref 5.0–8.0)

## 2021-12-29 LAB — MICROALBUMIN / CREATININE URINE RATIO
Creatinine, Urine: 40.9 mg/dL
Microalb/Creat Ratio: <7 mg/g creat (ref 0–29)
Microalbumin, Urine: <3 ug/mL

## 2021-12-29 LAB — COMPREHENSIVE METABOLIC PANEL
ALT: 5 IU/L (ref 0–32)
ALT: 9 U/L (ref 0–44)
AST: 8 IU/L (ref 0–40)
AST: 11 U/L — ABNORMAL LOW (ref 15–41)
Albumin/Globulin Ratio: 1.7 (ref 1.2–2.2)
Albumin: 4.2 g/dL (ref 3.8–4.8)
Albumin: 3.5 g/dL (ref 3.5–5.0)
Alkaline Phosphatase: 113 IU/L (ref 44–121)
Alkaline Phosphatase: 92 U/L (ref 38–126)
Anion gap: 15 (ref 5–15)
BUN/Creatinine Ratio: 17 (ref 12–28)
BUN: 39 mg/dL — ABNORMAL HIGH (ref 8–27)
BUN: 40 mg/dL — ABNORMAL HIGH (ref 8–23)
Bilirubin Total: 0.8 mg/dL (ref 0.0–1.2)
CO2: 21 mmol/L (ref 20–29)
CO2: 20 mmol/L — ABNORMAL LOW (ref 22–32)
Calcium: 8.3 mg/dL — ABNORMAL LOW (ref 8.7–10.3)
Calcium: 8.2 mg/dL — ABNORMAL LOW (ref 8.9–10.3)
Chloride: 92 mmol/L — ABNORMAL LOW (ref 96–106)
Chloride: 100 mmol/L (ref 98–111)
Creatinine, Ser: 2.3 mg/dL — ABNORMAL HIGH (ref 0.57–1.00)
Creatinine, Ser: 2.22 mg/dL — ABNORMAL HIGH (ref 0.44–1.00)
GFR, Estimated: 23 mL/min — ABNORMAL LOW (ref >60)
Globulin, Total: 2.5 g/dL (ref 1.5–4.5)
Glucose, Bld: 301 mg/dL — ABNORMAL HIGH (ref 70–99)
Glucose: 494 mg/dL — ABNORMAL HIGH (ref 70–99)
Potassium: 4.1 mmol/L (ref 3.5–5.2)
Potassium: 3.6 mmol/L (ref 3.5–5.1)
Sodium: 132 mmol/L — ABNORMAL LOW (ref 134–144)
Sodium: 135 mmol/L (ref 135–145)
Total Bilirubin: 1.6 mg/dL — ABNORMAL HIGH (ref 0.3–1.2)
Total Protein: 6.7 g/dL (ref 6.0–8.5)
Total Protein: 7.7 g/dL (ref 6.5–8.1)
eGFR: 22 mL/min/{1.73_m2} — ABNORMAL LOW (ref >59)

## 2021-12-29 LAB — URINALYSIS
Bilirubin, UA: NEGATIVE
Ketones, UA: NEGATIVE
Nitrite, UA: NEGATIVE
Protein,UA: NEGATIVE
RBC, UA: NEGATIVE
Specific Gravity, UA: 1.022 (ref 1.005–1.030)
Urobilinogen, Ur: 0.2 mg/dL (ref 0.2–1.0)
pH, UA: 6 (ref 5.0–7.5)

## 2021-12-29 LAB — LIPID PANEL
Chol/HDL Ratio: 4.4 ratio (ref 0.0–4.4)
Cholesterol, Total: 157 mg/dL (ref 100–199)
HDL: 36 mg/dL — ABNORMAL LOW (ref >39)
LDL Chol Calc (NIH): 91 mg/dL (ref 0–99)
Triglycerides: 174 mg/dL — ABNORMAL HIGH (ref 0–149)
VLDL Cholesterol Cal: 30 mg/dL (ref 5–40)

## 2021-12-29 LAB — TSH: TSH: 0.34 u[IU]/mL — ABNORMAL LOW (ref 0.450–4.500)

## 2021-12-29 LAB — CBC
HCT: 38 % (ref 36.0–46.0)
Hemoglobin: 12.6 g/dL (ref 12.0–15.0)
MCH: 31.4 pg (ref 26.0–34.0)
MCHC: 33.2 g/dL (ref 30.0–36.0)
MCV: 94.8 fL (ref 80.0–100.0)
Platelets: 335 10*3/uL (ref 150–400)
RBC: 4.01 MIL/uL (ref 3.87–5.11)
RDW: 14.8 % (ref 11.5–15.5)
WBC: 12.9 10*3/uL — ABNORMAL HIGH (ref 4.0–10.5)
nRBC: 0 % (ref 0.0–0.2)

## 2021-12-29 LAB — RESP PANEL BY RT-PCR (FLU A&B, COVID) ARPGX2
Influenza A by PCR: NEGATIVE
Influenza B by PCR: NEGATIVE
SARS Coronavirus 2 by RT PCR: NEGATIVE

## 2021-12-29 LAB — POCT INR: INR: 2.5 (ref 2.0–3.0)

## 2021-12-29 LAB — TROPONIN I (HIGH SENSITIVITY)
Troponin I (High Sensitivity): 12 ng/L (ref <18)
Troponin I (High Sensitivity): 12 ng/L (ref <18)

## 2021-12-29 NOTE — Discharge Instructions (Addendum)
Get help right away if: You are short of breath. You develop chest pain. You develop a new cough. These symptoms may be an emergency. Get help right away. Call 911. Do not wait to see if the symptoms will go away. Do not drive yourself to the hospital. 

## 2021-12-29 NOTE — Progress Notes (Signed)
No ICM remote transmission received for 12/27/2021 and next ICM transmission scheduled for 01/31/2022.   

## 2021-12-29 NOTE — ED Provider Notes (Signed)
[facility] EMERGENCY DEPARTMENT Provider Note   CSN: [id number] Arrival date & time: 12/29/21  1155     History  Chief Complaint  Patient presents with   Chest Pain    Emily [name] is a 71 y.o. female who presents emergency department with chief complaint of chest pain.  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  Has a history of atrial fibrillation on warfarin.  She has had 2 weeks of persistent lower precordial chest pain worse with coughing.  She denies fevers or chills   Chest Pain      Home Medications Prior to Admission medications   Medication Sig Start Date End Date Taking? Authorizing Provider  acetaminophen (TYLENOL) 500 MG tablet Take 1,000 mg by mouth every 6 (six) hours as needed for mild pain.   Yes [provider]  allopurinol (ZYLOPRIM) 100 MG tablet Take 100-200 mg by mouth See admin instructions. Take 200 mg in the morning 100 mg at bedtime   Yes [provider]  Cinnamon 500 MG capsule Take 500 mg by mouth daily.   Yes [provider]  ENTRESTO 24-26 MG TAKE (1) TABLET TWICE DAILY. Patient taking differently: Take 1 tablet by mouth 2 (two) times daily. 08/13/21  Yes Larey Dresser, MD  furosemide (LASIX) 80 MG tablet TAKE 1 TABLET BY MOUTH EVERY MORNING. 11/11/21  Yes Larey Dresser, MD  Glucosamine-Chondroitin (OSTEO BI-FLEX REGULAR STRENGTH PO) Take 1 tablet by mouth daily.   Yes [provider]  JARDIANCE 10 MG TABS tablet TAKE 1 TABLET ONCE A DAY BEFORE BREAKFAST. Patient taking differently: Take 10 mg by mouth daily. 04/14/21  Yes Larey Dresser, MD  levothyroxine (SYNTHROID) 88 MCG tablet TAKE 1 TABLET BY MOUTH DAILY BEFORE BREAKFAST. Patient taking differently: Take 88 mcg by mouth daily before breakfast. 07/16/21  Yes Ezequiel Essex, MD  loratadine (CLARITIN) 10 MG tablet Take 10 mg by mouth daily.   Yes [provider]  metoprolol succinate (TOPROL-XL) 50 MG 24 hr tablet Take 50 mg by mouth daily.   Yes [provider]  Omega-3 Fatty Acids (FISH OIL) 1200 MG CPDR Take 1,200 mg by mouth daily.   Yes [provider]  omeprazole (PRILOSEC) 40 MG capsule TAKE 1 CAPSULE BY MOUTH ONCE DAILY. Patient taking differently: Take 40 mg by mouth daily. 04/12/21  Yes Ezequiel Essex, MD  simvastatin (ZOCOR) 20 MG tablet TAKE 1 TABLET EVERY DAY AT 6 PM Patient taking differently: Take 20 mg by mouth daily at 6 PM. 12/14/21  Yes Satira Sark, MD  spironolactone (ALDACTONE) 25 MG tablet Take 0.5 tablets (12.5 mg total) by mouth daily. 07/15/21  Yes Larey Dresser, MD  TOUJEO SOLOSTAR 300 UNIT/ML Solostar Pen INJECT 24 UNITS INTO THE SKIN EVERY DAY Patient taking differently: Inject 24 Units into the skin daily at 6 (six) AM. 09/14/21  Yes Ezequiel Essex, MD  warfarin (COUMADIN) 2 MG tablet TAKE 1 TO 2 TABLETS ONCE A DAY AS DIRECTED BY ANTICOAGULATION. Patient taking differently: Take 2 mg by mouth daily. 11/10/21  Yes Satira Sark, MD  nitroGLYCERIN (NITROSTAT) 0.4 MG SL tablet Place 1 tablet (0.4 mg total) under the tongue every 5 (five) minutes as needed for chest pain. 12/15/21 03/15/22  Rafael Bihari, FNP      Allergies    Amiodarone, Codeine, Other, Turmeric, Colchicine, Iodine, and Mexitil [mexiletine]    Review  of Systems   Review of Systems  Cardiovascular:  Positive for chest pain.    Physical Exam Updated Vital Signs BP (!) 137/52   Pulse 60   Temp 98 F (36.7 C)   Resp 15   Ht 5\' 2"  (1.575 m)   Wt 59.9 kg   SpO2 99%   BMI 24.14 kg/m  Physical Exam Physical Exam  Nursing note and vitals reviewed. Constitutional: She is oriented to person, place, and time. She appears well-developed and well-nourished. No distress.  HENT:  Head: Normocephalic and atraumatic.  Eyes: Conjunctivae normal and EOM are normal. Pupils are equal, round, and reactive to light.  No scleral icterus.  Neck: Normal range of motion.  Cardiovascular: Normal rate, regular rhythm and normal heart sounds.  Exam reveals no gallop and no friction rub.   No murmur heard.no peripheral edema Pulmonary/Chest: Effort normal and breath sounds normal. No respiratory distress.  Abdominal: Soft. Bowel sounds are normal. She exhibits no distension and no mass. There is no tenderness. There is no guarding.  Neurological: She is alert and oriented to person, place, and time.  Skin: Skin is warm and dry. She is not diaphoretic.   ED Results / Procedures / Treatments   Labs (all labs ordered are listed, but only abnormal results are displayed) Labs Reviewed  CBC - Abnormal; Notable for the following components:      Result Value   WBC 12.9 (*)    All other components within normal limits  COMPREHENSIVE METABOLIC PANEL - Abnormal; Notable for the following components:   CO2 20 (*)    Glucose, Bld 301 (*)    BUN 40 (*)    Creatinine, Ser 2.22 (*)    Calcium 8.2 (*)    AST 11 (*)    Total Bilirubin 1.6 (*)    GFR, Estimated 23 (*)    All other components within normal limits  URINALYSIS, ROUTINE W REFLEX MICROSCOPIC - Abnormal; Notable for the following components:   APPearance HAZY (*)    Glucose, UA >=500 (*)    Leukocytes,Ua MODERATE (*)    Bacteria, UA RARE (*)    All other components within normal limits  RESP PANEL BY RT-PCR (FLU A&B, COVID) ARPGX2  TROPONIN I (HIGH SENSITIVITY)  TROPONIN I (HIGH SENSITIVITY)    EKG EKG Interpretation  Date/Time:  Wednesday December 29 2021 12:22:55 EDT Ventricular Rate:  61 PR Interval:  246 QRS Duration: 144 QT Interval:  519 QTC Calculation: 523 R Axis:   188 Text Interpretation: Sinus rhythm Atrial premature complex Prolonged PR interval Right bundle branch block Abnormal lateral Q waves Anterior infarct, old no significant change since yesterday Confirmed by Sherwood Gambler [phone]) on 12/29/2021 12:32:30 PM  Radiology DG  Chest Port 1 View  Result Date: 12/29/2021 CLINICAL DATA:  Chest pain EXAM: PORTABLE CHEST 1 VIEW COMPARISON:  Chest 12/14/2021 FINDINGS: Transvenous pacer/AICD with multiple leads unchanged from the prior study. Lungs clear without pneumonia or edema. Small bilateral pleural effusions. IMPRESSION: Small bilateral pleural effusions. Negative for heart failure or pneumonia. Electronically Signed   By: Franchot Gallo M.D.   On: 12/29/2021 12:57    Procedures Procedures    Medications Ordered in ED Medications - No data to display  ED Course/ Medical Decision Making/ A&P Clinical Course as of 12/29/21 1458  Wed Dec 29, 2021  1457 Urinalysis, Routine w reflex microscopic Urine, Clean Catch(!) [AH]  1457 Creatinine(!): 2.22 Cr at baseline [AH]  1457 CBC(!) [AH]  Clinical Course User Index [AH] Margarita Mail, PA-C                           Medical Decision Making Amount and/or Complexity of Data Reviewed External Data Reviewed: notes.    Details: Echo, previous notes Labs: ordered. Decision-making details documented in ED Course.    Details: Troponin neg x2 Cr at baseline Radiology: ordered and independent interpretation performed.    Details: Cxr and ct chest show small BL plerual effusions ECG/medicine tests: ordered and independent interpretation performed.    Details: EKG sr at 61 no changes from previous tracing   Given the large differential diagnosis for Larry Sierras, the decision making in this case is of high complexity.  After evaluating all of the data points in this case, the presentation of Emily [name] is NOT consistent with Acute Coronary Syndrome (ACS) and/or myocardial ischemia, pulmonary embolism, aortic dissection; Borhaave's, significant arrythmia, pneumothorax, cardiac tamponade, or other emergent cardiopulmonary condition.  Further, the presentation of Emily [name] is NOT consistent with pericarditis, myocarditis, cholecystitis, pancreatitis,  mediastinitis, endocarditis, new valvular disease.  Additionally, the presentation of Emily Baskins Perkinsis NOT consistent with flail chest, cardiac contusion, ARDS, or significant intra-thoracic or intra-abdominal bleeding.  Moreover, this presentation is NOT consistent with pneumonia, sepsis, or pyelonephritis.  The patient has BL pleural effusions - likely the cause of her persistent chest pain.   Strict return and follow-up precautions have been given by me personally or by detailed written instruction given verbally by nursing staff using the teach back method to the patient/family/caregiver(s).  Data Reviewed/Counseling: I have reviewed the patient's vital signs, nursing notes, and other relevant tests/information. I had a detailed discussion regarding the historical points, exam findings, and any diagnostic results supporting the discharge diagnosis. I also discussed the need for outpatient follow-up and the need to return to the ED if symptoms worsen or if there are any questions or concerns that arise at home.    Final Clinical Impression(s) / ED Diagnoses Final diagnoses:  None    Rx / DC Orders ED Discharge Orders     None         Margarita Mail, PA-C 12/31/21 1039    Sherwood Gambler, MD 12/31/21 1702

## 2021-12-29 NOTE — ED Notes (Signed)
Patient transported to CT 

## 2021-12-29 NOTE — Telephone Encounter (Signed)
Patient made aware and verbalized understanding. EMS called and reports that patient will be transported to [facility] for observation.

## 2021-12-29 NOTE — ED Triage Notes (Signed)
Pt arrived from drs office via [facility] with complaints of chest pain that got worse today. Say she has had abnormal chest pain for two weeks. Pt has hx of CHF w pacemaker/defibrillator.

## 2021-12-29 NOTE — Telephone Encounter (Signed)
Patient came in the office to have her coumadin checked and states that she has been having intermittent chest pains x 2 weeks. States that when her chest pains started, she developed  dizziness and trouble breathing. States that the following Monday she did not have any symptoms but Tuesday she started having chest pains and went to [facility] for evaluation. States that while she was there, she had chest xray, EKG and labs and sat in the waiting room for 9 hours and still had not been seen so she decided to leave without being evaluated. States that since then she has had intermittent chest pains. States that she did take nitro last week, once one day and twice the next day but felt no relief. States that she went to see her PCP yesterday  where she had another EKG and compared to the EKG she had at [facility], it looked fine. States that her PCP told her that her BP was low and that this could possible be coming from her diabetes. Also states that she was told by PCP that she sounded like she had a blood clot in her lungs but states that she was not told what she needed to do about it. States that her  PCP instructed her to hold her metoprolol for today and resume the next day but states that she has taken all of her other medications today. States that she is now having sob, chest pains 10/10, dizziness and fatigue. Denies nausea and vomiting. Vitals today 98/72, P 56 and oxygen 100% Patient is still in the office, please advise.

## 2021-12-29 NOTE — ED Notes (Signed)
Lab at bedside

## 2021-12-29 NOTE — Progress Notes (Signed)
Patient now in the emergency room, EMS was called by cardiology clinic given worsened chest pain. Will follow peripherally.  Ezequiel Essex, MD

## 2021-12-29 NOTE — Patient Instructions (Signed)
Continue warfarin 1 tablet daily  Continue greens Recheck in 4 weeks -Call [phone] if started on any new medications.

## 2021-12-29 NOTE — Telephone Encounter (Signed)
Pt c/o of Chest Pain: STAT if CP now or developed within 24 hours  1. Are you having CP right now?   yes  2. Are you experiencing any other symptoms (ex. SOB, nausea, vomiting, sweating)?   sob  3. How long have you been experiencing CP?   For a few weeks  4. Is your CP continuous or coming and going?   Continuous  5. Have you taken Nitroglycerin?    Yes, but it doesn't help  Went to the ER yesterday and sat for 9 hrs in the lobby waiting for a room and was never seen  She is currently here seeing Lattie Haw for INR check  ?

## 2021-12-30 ENCOUNTER — Telehealth: Payer: Self-pay

## 2021-12-30 DIAGNOSIS — R911 Solitary pulmonary nodule: Secondary | ICD-10-CM

## 2021-12-30 NOTE — Telephone Encounter (Signed)
Received phone call from Antionette with [employer] regarding patient. Patient was seen in the [facility] ED for PE and lung nodule.   Patient is asking if she could receive referral to pulmonology.   She was last seen by PCP on 12/28/21.  Please advise.   Talbot Grumbling, RN

## 2021-12-30 NOTE — Telephone Encounter (Signed)
Sure. Will order referral.  Ezequiel Essex, MD

## 2021-12-30 NOTE — Addendum Note (Signed)
Addended by: Renard Hamper on: 12/30/2021 01:25 PM   Modules accepted: Orders

## 2021-12-31 ENCOUNTER — Telehealth: Payer: Self-pay | Admitting: Cardiology

## 2021-12-31 DIAGNOSIS — I10 Essential (primary) hypertension: Secondary | ICD-10-CM

## 2021-12-31 DIAGNOSIS — I5022 Chronic systolic (congestive) heart failure: Secondary | ICD-10-CM

## 2021-12-31 NOTE — Telephone Encounter (Signed)
Patient questions if she should increase her fluid pill due to the pleural effusion.  Informed her to continue all medications the same for now.  Upon ED discharge, no new medications or changes were noted on the ED note.  Encouraged to try to make contact with her pcp on Monday.    Will fwd to provider for any other suggestions in the meantime.

## 2021-12-31 NOTE — Telephone Encounter (Signed)
Suggested that she call her pcp since she was told at discharge to follow up with them.  She has been taking the Tylenol 500mg  - 2 tabs every 6 hours, but not helping a lot.  She will call her pcp.

## 2021-12-31 NOTE — Telephone Encounter (Signed)
Patient daughter called stating that she was sent via ambulance from the office to [place], they found out she has a pulmonary effusion.  Daughter states they didn't give her anything and sent her home.  Daughter states her mother is in a lot of pain and wants to know if anything can be given to her for the pain.

## 2021-12-31 NOTE — Telephone Encounter (Signed)
Pt would like a callback from nurse regarding conversation from earlier. Please advise

## 2022-01-03 NOTE — Telephone Encounter (Signed)
Agree with Dr. Myles Gip plan to increase Lasix to 80 daily alternating with 80 qam/40 qpm.  Needs BMET in 10 days.  Needs followup in our office within 2 wks.

## 2022-01-03 NOTE — Telephone Encounter (Signed)
No answer

## 2022-01-06 MED ORDER — FUROSEMIDE 80 MG PO TABS: ORAL_TABLET | ORAL | Status: DC

## 2022-01-06 NOTE — Telephone Encounter (Signed)
Received call back from patient - stated she thought she had appointment with her pulmonologist today, but turns out it was for her daughter.  Patient is scheduled for 01/27/2022.    Patient notified of Dr. Domenic Polite & Dr. Claris Gladden recommendations.  She will change her Lasix to 80mg  every other day alternating with 120mg  am & 40mg  pm QOD.  She will do her labs at [facility] around 11/22 or 01/24/2022.  Order faxed now.    Message sent to schedulers in regard to f/u visit needed in 2 weeks.

## 2022-01-06 NOTE — Telephone Encounter (Signed)
Spoke with husband Fritz Pickerel) - she is not at home - thinks she is at appointment in [place] "something" .  Asked him to have her to return call when she gets back - he verbalized understanding.

## 2022-01-10 ENCOUNTER — Telehealth: Payer: Self-pay

## 2022-01-10 NOTE — Telephone Encounter (Signed)
     Patient  visit on 1/11  at [place]   Have you been able to follow up with your primary care physician? Yes   The patient was or was not able to obtain any needed medicine or equipment. Yes    Are there diet recommendations that you are having difficulty following? Na   Patient expresses understanding of discharge instructions and education provided has no other needs at this time.  Yes      Kandon Hosking [facility], [place], Care Management  [phone] [address], [place], [place] [address] Phone: [phone] Email: [email or web address]    

## 2022-01-14 ENCOUNTER — Ambulatory Visit (HOSPITAL_COMMUNITY)
Admission: RE | Admit: 2022-01-14 | Discharge: 2022-01-14 | Disposition: A | Discharge status: Home / Self Care | Payer: PPO | Source: Ambulatory Visit | Attending: Cardiology | Admitting: Cardiology

## 2022-01-14 ENCOUNTER — Encounter (HOSPITAL_COMMUNITY): Payer: Self-pay | Admitting: Cardiology

## 2022-01-14 VITALS — BP 128/58 | HR 91 | Wt 133.2 lb

## 2022-01-14 DIAGNOSIS — Q211 Atrial septal defect, unspecified: Secondary | ICD-10-CM | POA: Insufficient documentation

## 2022-01-14 DIAGNOSIS — I428 Other cardiomyopathies: Secondary | ICD-10-CM | POA: Diagnosis not present

## 2022-01-14 DIAGNOSIS — I4821 Permanent atrial fibrillation: Secondary | ICD-10-CM | POA: Diagnosis not present

## 2022-01-14 DIAGNOSIS — I2510 Atherosclerotic heart disease of native coronary artery without angina pectoris: Secondary | ICD-10-CM | POA: Insufficient documentation

## 2022-01-14 DIAGNOSIS — Z7984 Long term (current) use of oral hypoglycemic drugs: Secondary | ICD-10-CM | POA: Insufficient documentation

## 2022-01-14 DIAGNOSIS — N184 Chronic kidney disease, stage 4 (severe): Secondary | ICD-10-CM | POA: Diagnosis not present

## 2022-01-14 DIAGNOSIS — Z7901 Long term (current) use of anticoagulants: Secondary | ICD-10-CM | POA: Diagnosis not present

## 2022-01-14 DIAGNOSIS — I493 Ventricular premature depolarization: Secondary | ICD-10-CM | POA: Insufficient documentation

## 2022-01-14 DIAGNOSIS — I350 Nonrheumatic aortic (valve) stenosis: Secondary | ICD-10-CM | POA: Insufficient documentation

## 2022-01-14 DIAGNOSIS — E1122 Type 2 diabetes mellitus with diabetic chronic kidney disease: Secondary | ICD-10-CM | POA: Insufficient documentation

## 2022-01-14 DIAGNOSIS — Z79899 Other long term (current) drug therapy: Secondary | ICD-10-CM | POA: Diagnosis not present

## 2022-01-14 DIAGNOSIS — R21 Rash and other nonspecific skin eruption: Secondary | ICD-10-CM | POA: Diagnosis not present

## 2022-01-14 DIAGNOSIS — M109 Gout, unspecified: Secondary | ICD-10-CM | POA: Diagnosis not present

## 2022-01-14 DIAGNOSIS — Z8679 Personal history of other diseases of the circulatory system: Secondary | ICD-10-CM | POA: Diagnosis not present

## 2022-01-14 DIAGNOSIS — I5022 Chronic systolic (congestive) heart failure: Secondary | ICD-10-CM | POA: Diagnosis present

## 2022-01-14 LAB — BRAIN NATRIURETIC PEPTIDE: B Natriuretic Peptide: 256.5 pg/mL — ABNORMAL HIGH (ref 0.0–100.0)

## 2022-01-14 LAB — BASIC METABOLIC PANEL
Anion gap: 15 (ref 5–15)
BUN: 29 mg/dL — ABNORMAL HIGH (ref 8–23)
CO2: 23 mmol/L (ref 22–32)
Calcium: 7.9 mg/dL — ABNORMAL LOW (ref 8.9–10.3)
Chloride: 98 mmol/L (ref 98–111)
Creatinine, Ser: 2.2 mg/dL — ABNORMAL HIGH (ref 0.44–1.00)
GFR, Estimated: 23 mL/min — ABNORMAL LOW (ref >60)
Glucose, Bld: 220 mg/dL — ABNORMAL HIGH (ref 70–99)
Potassium: 3.1 mmol/L — ABNORMAL LOW (ref 3.5–5.1)
Sodium: 136 mmol/L (ref 135–145)

## 2022-01-14 MED ORDER — SPIRONOLACTONE 25 MG PO TABS: 25.0000 mg | ORAL_TABLET | Freq: Every day | ORAL | 3 refills | Status: DC

## 2022-01-14 MED ORDER — FUROSEMIDE 80 MG PO TABS: ORAL_TABLET | ORAL | 3 refills | Status: DC

## 2022-01-14 NOTE — Patient Instructions (Addendum)
EKG done today.  Labs done today. We will contact you only if your labs are abnormal.  INCREASE Lasix to 80mg  (1 tablet) by mouth every morning and 40mg  (1/2 tablet) by mouth every evening.   INCREASE Spironolactone to 25mg  (1 tablet) by mouth daily.   No other medication changes were made. Please continue all current medications as prescribed.  Your physician recommends that you keep your scheduled follow-up appointment and get labs in 10 days at [place].  If you have any questions or concerns before your next appointment please send [place] a message through [place] or call our office at [phone].    TO LEAVE A MESSAGE FOR THE NURSE SELECT OPTION 2, PLEASE LEAVE A MESSAGE INCLUDING: YOUR NAME DATE OF BIRTH CALL BACK NUMBER REASON FOR CALL**this is important as we prioritize the call backs  YOU WILL RECEIVE A CALL BACK THE SAME DAY AS LONG AS YOU CALL BEFORE 4:00 PM   Do the following things EVERYDAY: Weigh yourself in the morning before breakfast. Write it down and keep it in a log. Take your medicines as prescribed Eat low salt foods--Limit salt (sodium) to 2000 mg per day.  Stay as active as you can everyday Limit all fluids for the day to less than 2 liters   At the [facility], you and your health needs are our priority. As part of our continuing mission to provide you with exceptional heart care, we have created designated Provider Care Teams. These Care Teams include your primary Cardiologist (physician) and Advanced Practice Providers (APPs- Physician Assistants and Nurse Practitioners) who all work together to provide you with the care you need, when you need it.   You may see any of the following providers on your designated Care Team at your next follow up: Dr Glori Bickers Dr Haynes Kerns, NP Lyda Jester, [place] Audry Riles, PharmD   Please be sure to bring in all your medications bottles to every appointment.

## 2022-01-16 NOTE — Progress Notes (Signed)
PCP: Ezequiel Essex, MD Cardiology: Dr. Domenic Polite HF Cardiology: Dr. Aundra Dubin EP: Dr. Lovena Le   71 y.o. with history of CKD stage IV, permanent atrial fibrillation, and a long-standing nonischemic cardiomyopathy was referred by Dr. Domenic Polite for CHF clinic evaluation.  She first developed cardiac problems in 1988.  At that time, she was admitted with CHF and eventually underwent myocardial biopsy at [facility], per her report showing myocarditis.  Patient states that her EF has been low since then.  She had MDT CRT-D device implanted.  Echo in 7/21 showed EF 20-25%, moderate MR, mild AS. She is now in permanent atrial fibrillation and is s/p AV nodal ablation with BiV pacing. Elevated creatinine has limited medication management.    Echo in 10/22 showed EF 25-30%, severe LV dilation, RV normal, moderate TR, mod-severe AS with mean gradient 9 mmHg but AVA 0.76 cm^2 (?low gradient severe AS), mild-moderate MR, left=>right shunt possible ASD.  TEE done in 10/22 to more closely assess AS.  This showed EF 30%, moderate LV dilation, normal RV, possible calcified vegetation on the ICD lead, small secundum ASD, peak RV-RA gradient 23 mmHg, low flow/low gradient moderate AS, mild-moderate MR. RHC/LHC was done, showing mild nonobstructive CAD, left to right atrial level shunt with Qp/Qs 1.43.  I reviewed TEE and cath report with Dr. Burt Knack.  We decided that AS was not severe enough yet for TAVR and that the ASD was not significant enough to close percutaneously. With possible calcified vegetation on ICD, we drew blood cultures which were negative.  She saw ID and had a PET-CT, which did not suggest active endocarditis.   Patient was found on Zio patch in 10/22 to have 12.6% PVCs, this was thought to be limiting her BiV pacing.  She has a history of intolerance of amiodarone so was started on mexiletine.  She says that this caused a rash so she stopped it and rash resolved. She also decreased Toprol XL to 100 mg daily due to  nightmares when taking 150 mg daily.  She subsequently has decreased Toprol XL to 50 mg daily.   Echo in 10/23 showed EF 25-30%, severe LV dilation, normal RV, mild MR, moderate AS with AVA 1.1 cm^2, mean gradient 9 mmHg (low flow low gradient moderate AS).   She presents today for followup of CHF.  She remains short of breath with moderate exertion.  She feels weak in general.  She has orthopnea, chronically sleeps on 2 pillows. She is in atrial fibrillation. Weight stable.  Can tolerate current Toprol XL dose without nightmares. She reports atypical chest pain when she "has fluid."   ECG (personally reviewed): atrial fibrillation with BiV pacing  Labs (1/21): LDL 118 Labs (9/21): K 4.4, creatinine 1.93 Labs (12/21): K 4, creatinine 1.77 Labs (2/22): TSH normal Labs (5/22): K 5, creatinine 2.42 Labs (9/22): K 4.3, creatinine 1.73, LDL 90 Labs (10/22): K 4.2, creatinine 2.18 Labs (2/23): K 3.8, creatinine 2.42 Labs (11/23): K 3.6, creatinine 2.22  Medtronic device interrogation: Bi V pacing >95%. No AF/VT   PMH: 1. Gout 2. Complete heart block: Medtronic CRT device.  3. CKD stage IV: Diabetic nephropathy.  4. Type 2 diabetes 5. Atrial fibrillation: Now permanent.   - AF ablation 4/18.  - AV nodal ablation in 2019 with MDT CRT-D.  6. Remote LV thrombus 7. Chronic systolic CHF: Nonischemic cardiomyopathy. Medtronic CRT-D device.  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  - LHC 2004: Nonobstructive mild CAD.  -  Echo (8/19): EF 10-15%, mildly dilated LV, mild-moderately dilated RV with mild-moderately decreased systolic function.  - Echo (7/21): EF 20-25%, low normal RV function, moderate MR, mild AS.  - Echo (10/22): EF 25-30%, severe LV dilation, RV normal, moderate TR, mod-severe AS with mean gradient 9 mmHg but AVA 0.76 cm^2 (?low gradient severe AS), mild-moderate MR, left=>right shunt possible ASD.  - Echo (10/23): EF 25-30%, severe LV dilation,  normal RV, mild MR, moderate AS with AVA 1.1 cm^2, mean gradient 9 mmHg (low flow low gradient moderate AS). ASD noted.  - RHC/LHC (10/22): mild nonobstructive CAD, left to right atrial level shunt with Qp/Qs 1.43.  8. H/o cholecystectomy 9. Hypothyroidism 10. 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 Aortic stenosis: TEE (10/22) showed EF 30%, moderate LV dilation, normal RV, possible calcified vegetation on the ICD lead, small secundum ASD, peak RV-RA gradient 23 mmHg, low flow/low gradient moderate AS, mild-moderate MR.  - Echo in 10/23 confirmed moderate low flow/low gradient AS.  13. 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  38. 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  - RHC in 10/22 showed atrial level shunt with Qp/Qs 1.43.  15. PVCs: Zio monitor (10/22) with 12.6% PVCs. - Intolerant of both amiodarone and mexiletine.   SH: Married, lives in [place].  No smoking or ETOH.   Family History  Problem Relation Age of Onset   Hip fracture Mother    COPD Father    CAD Sister    Heart attack Sister    ROS: All systems reviewed and negative except as per HPI.   Current Outpatient Medications  Medication Sig Dispense Refill   acetaminophen (TYLENOL) 500 MG tablet Take 1,000 mg by mouth every 6 (six) hours as needed for mild pain.     allopurinol (ZYLOPRIM) 100 MG tablet Take 100-200 mg by mouth See admin instructions. Take 200 mg in the morning 100 mg at bedtime     Cinnamon 500 MG capsule Take 500 mg by mouth daily.     ENTRESTO 24-26 MG TAKE (1) TABLET TWICE DAILY. 60 tablet 6   Glucosamine-Chondroitin (OSTEO BI-FLEX REGULAR STRENGTH PO) Take 1 tablet by mouth daily.     JARDIANCE 10 MG TABS tablet TAKE 1 TABLET ONCE A DAY BEFORE BREAKFAST. 30 tablet 11   levothyroxine (SYNTHROID) 88 MCG tablet TAKE 1 TABLET BY MOUTH DAILY BEFORE BREAKFAST. 30 tablet 11   loratadine (CLARITIN) 10 MG tablet Take 10 mg by mouth daily.      metoprolol succinate (TOPROL-XL) 50 MG 24 hr tablet Take 50 mg by mouth daily.     nitroGLYCERIN (NITROSTAT) 0.4 MG SL tablet Place 1 tablet (0.4 mg total) under the tongue every 5 (five) minutes as needed for chest pain. 90 tablet 3   Omega-3 Fatty Acids (FISH OIL) 1200 MG CPDR Take 1,200 mg by mouth daily.     omeprazole (PRILOSEC) 40 MG capsule TAKE 1 CAPSULE BY MOUTH ONCE DAILY. 90 capsule 3   simvastatin (ZOCOR) 20 MG tablet TAKE 1 TABLET EVERY DAY AT 6 PM 30 tablet 5   TOUJEO SOLOSTAR 300 UNIT/ML Solostar Pen INJECT 24 UNITS INTO THE SKIN EVERY DAY 4.5 mL 5   warfarin (COUMADIN) 2 MG tablet TAKE 1 TO 2 TABLETS ONCE A DAY AS DIRECTED BY ANTICOAGULATION. (Patient taking differently: Take 2 mg by mouth daily.) 45 tablet 0   furosemide (LASIX) 80 MG tablet Take 1 tablet (80 mg total) by mouth every  morning AND 0.5 tablets (40 mg total) every evening. 135 tablet 3   spironolactone (ALDACTONE) 25 MG tablet Take 1 tablet (25 mg total) by mouth daily. 90 tablet 3   No current facility-administered medications for this encounter.   BP (!) 128/58   Pulse 91   Wt 60.4 kg (133 lb 3.2 oz)   SpO2 98%   BMI 24.36 kg/m  Wt Readings from Last 3 Encounters:  01/14/22 60.4 kg (133 lb 3.2 oz)  12/29/21 59.9 kg (132 lb)  12/28/21 59.9 kg (132 lb)   PHYSICAL EXAM: General: NAD Neck: JVP 8 cm, no thyromegaly or thyroid nodule.  Lungs: Clear to auscultation bilaterally with normal respiratory effort. CV: Nondisplaced PMI.  Heart regular S1/S2, no S3/S4, 1/6 SEM RUSB.  No peripheral edema.  No carotid bruit.  Normal pedal pulses.  Abdomen: Soft, nontender, no hepatosplenomegaly, no distention.  Skin: Intact without lesions or rashes.  Neurologic: Alert and oriented x 3.  Psych: Normal affect. Extremities: No clubbing or cyanosis.  HEENT: Normal.   Assessment/Plan: 1. Chronic systolic CHF: Nonischemic cardiomyopathy.  Low EF for years.  Per patient's report, she had an endomyocardial biopsy at  [facility] in 1988 showing myocarditis.  Medication titration has been limited by CKD stage IV. Has MDT CRT-D device, s/p AV nodal ablation with chronic AF. Echo in 7/21 showed EF 20-25%, moderate MR, mild AS.  Echo 12/17/20 showed EF 25-30%, severe LV dilation, RV normal, moderate TR, mod-severe AS with mean gradient 9 mmHg but AVA 0.76 cm^2.  TEE in 10/22 with EF 30%.  LHC in 10/22 with mild nonobstructive CAD.  Echo in 10/23 showed EF 25-30%, severe LV dilation, normal RV, mild MR, moderate AS with AVA 1.1 cm^2, mean gradient 9 mmHg (low flow low gradient moderate AS), small ASD. She is BiV pacing >95% of the time.  NYHA class III symptoms.  She looks mildly volume overloaded by exam.  - Increase Lasix to 80 qam/40 qpm. BMET/BNP today, BMET 10 days.  - Increase spironolactone to 25 mg daily.  - Continue Jardiance 10 mg daily.  - Continue Entresto 24/26 bid. Would not increase with elevated creatinine.  - Continue Toprol XL 50 mg daily, cannot tolerate higher dose due to nightmares   2.  Atrial fibrillation: Permanent.  S/p AV nodal ablation with BiV pacing.   - Continue warfarin.  INRs followed by [facility].  4. PVCs: Zio 10/22 with 12.6% PVCs. Suspect fewer now as BiV pacing percentage >95%.  - Continue Toprol XL 50 mg daily.  - Unable to tolerate amiodarone in the past.  - She had a rash with mexiletine and stopped it.  5. Aortic stenosis: TEE in 10/22 showed low flow/low gradient moderate aortic stenosis.  Discussed with Dr. Burt Knack, at that point was not felt to need TAVR at the time. Echo in 10/23 with moderate low flow/low gradient AS.  6. ASD: Patient has an ASD by echo with L=>R shunting, small.  Qp/Qs by RHC was 1.43. Small on last echo.  7. Gout: Stable.  8. Calcified device lead vegetation: Blood cultures negative, PET-CT in 11/22 not suggestive of active endocarditis.    Loralie Champagne,  01/16/2022

## 2022-01-17 ENCOUNTER — Telehealth (HOSPITAL_COMMUNITY): Payer: Self-pay | Admitting: Cardiology

## 2022-01-17 ENCOUNTER — Other Ambulatory Visit (HOSPITAL_COMMUNITY): Payer: Self-pay | Admitting: Cardiology

## 2022-01-17 DIAGNOSIS — I5022 Chronic systolic (congestive) heart failure: Secondary | ICD-10-CM

## 2022-01-17 MED ORDER — POTASSIUM CHLORIDE CRYS ER 20 MEQ PO TBCR: 20.0000 meq | EXTENDED_RELEASE_TABLET | Freq: Every day | ORAL | 6 refills | Status: DC

## 2022-01-17 NOTE — Telephone Encounter (Signed)
Patient called.  Patient aware. Pt reports she was set up for repeat labs with Dr Jeani Hawking Second order sent just in case

## 2022-01-17 NOTE — Telephone Encounter (Signed)
-----   Message from Larey Dresser, MD sent at 01/16/2022 11:22 PM EST ----- Add an additional 20 mEq KCl to her daily regimen with BMET in 10 days.

## 2022-01-18 ENCOUNTER — Telehealth: Payer: Self-pay

## 2022-01-18 NOTE — Telephone Encounter (Signed)
Returned call to patient as requested by voice mail message. She stated she has been having chest pain and Dr Aundra Dubin saw her on 11/17 for evaluation. She said Dr Aundra Dubin increased Furosemide at 11/17 OV.  Weight today is 131 lbs.  She has been eating restaurant foods within the last 2 weeks.  She will send remote transmission in the morning for review of fluid levels.  She is not having any chest pain today.

## 2022-01-19 MED ORDER — FUROSEMIDE 80 MG PO TABS: 80.0000 mg | ORAL_TABLET | Freq: Two times a day (BID) | ORAL | 3 refills | Status: DC

## 2022-01-19 NOTE — Telephone Encounter (Signed)
Increase Lasix to 80 mg bid, BMET next week.

## 2022-01-19 NOTE — Telephone Encounter (Signed)
Spoke with patient and advised Dr Aundra Dubin recommended she take Furosemide 80 mg every morning and 80 mg every evening.  She needs labs drawn next week  She already has labs scheduled for 11/27 at [facility].  She has enough Lasix on hand for new dosage and will call pharmacy when refill is needed.  Advised to call back if she has any problems taking new dosage or if she thinks fluid is not resolving.  She verbalized understanding of instructions and agreed with plan. BMET ordered has already been placed.

## 2022-01-19 NOTE — Telephone Encounter (Signed)
Spoke with patient and remote transmission reviewed with patient.   She has been coughing for 3-4 weeks. Weight stable at 130 lbs. She was told x-ray showed fluid between heart and lungs.    Dr Aundra Dubin increased Furosemide, at 11/17 OV, to 80 mg every morning and 40 mg every evening and confirmed she is taking this dosage.    Potassium 20 mEq by mouth daily prescribed 11/20 in response to 11/17 lab results.    01/14/2022 BNP 256.5 01/14/2022 Creatinine 2.20, BUN 29, Potassium 3.1, Sodium 136, GFR 23 (potassium prescribed) 12/29/2021 Creatinine 2.22, BUN 40, Potassium 3.6, Sodium 135, GFR 23  Copy sent to Dr Aundra Dubin for review and recommendations for Lasix and Potassium adjustments if needed.    Advised patient to avoid eating restaurant foods and limit fluid intake to 64 oz daily.   01/19/2022  Thoracic impedance shows possible fluid accumulation starting 11/1 without any improvement since Lasix increase on 11/17.

## 2022-01-19 NOTE — Telephone Encounter (Signed)
Spoke with patient and requested she send remote transmission. She attempted to send this morning but was not received.  She attempted to send manual remote transmission during call phone but received error number.  Provided carelink tech support number and advised to call.  She will call back with update.

## 2022-01-24 ENCOUNTER — Telehealth (HOSPITAL_COMMUNITY): Payer: Self-pay

## 2022-01-24 ENCOUNTER — Encounter: Payer: Self-pay | Admitting: Cardiology

## 2022-01-24 ENCOUNTER — Other Ambulatory Visit (HOSPITAL_COMMUNITY): Payer: Self-pay | Admitting: *Deleted

## 2022-01-24 ENCOUNTER — Other Ambulatory Visit (HOSPITAL_COMMUNITY): Payer: Self-pay

## 2022-01-24 MED ORDER — EMPAGLIFLOZIN 10 MG PO TABS
10.0000 mg | ORAL_TABLET | Freq: Every day | ORAL | 11 refills | Status: DC
  Filled 2022-01-24 – 2022-02-07 (×3): qty 30, 30d supply, fill #0
  Filled 2022-03-08: qty 30, 30d supply, fill #1
  Filled 2022-04-11: qty 30, 30d supply, fill #2
  Filled 2022-05-09: qty 30, 30d supply, fill #3
  Filled 2022-06-09: qty 30, 30d supply, fill #4
  Filled 2022-07-18: qty 30, 30d supply, fill #5
  Filled 2022-08-22: qty 30, 30d supply, fill #6
  Filled 2022-09-19: qty 30, 30d supply, fill #7
  Filled 2022-10-17: qty 30, 30d supply, fill #8
  Filled 2022-11-15: qty 30, 30d supply, fill #9
  Filled 2022-12-16: qty 30, 30d supply, fill #10
  Filled 2023-01-16: qty 30, 30d supply, fill #11

## 2022-01-24 MED ORDER — ENTRESTO 24-26 MG PO TABS
1.0000 | ORAL_TABLET | Freq: Two times a day (BID) | ORAL | 6 refills | Status: DC
  Filled 2022-01-24: qty 60, 30d supply, fill #0
  Filled 2022-03-08: qty 60, 30d supply, fill #1
  Filled 2022-04-11: qty 60, 30d supply, fill #2
  Filled 2022-05-09: qty 60, 30d supply, fill #3
  Filled 2022-06-09: qty 60, 30d supply, fill #4
  Filled 2022-07-18: qty 60, 30d supply, fill #5
  Filled 2022-08-22: qty 60, 30d supply, fill #6

## 2022-01-24 NOTE — Telephone Encounter (Signed)
Advanced Heart Failure Patient Advocate Encounter  The patient was approved for a [employer] grant that will help cover the cost of Entresto, Jardiance.  Total amount awarded, $10,000.  Effective: 12/25/2021 - 12/25/2022.  BIN [id number] PCN [id number] Group [id number] ID [id number]  New prescription(s) sent to [facility]. Patient provided with approval and processing information via USPS.  Clista Bernhardt, CPhT Rx Patient Advocate Phone: [phone]

## 2022-01-25 ENCOUNTER — Other Ambulatory Visit (HOSPITAL_COMMUNITY): Payer: Self-pay

## 2022-01-25 LAB — BASIC METABOLIC PANEL
BUN/Creatinine Ratio: 13 (ref 12–28)
BUN: 31 mg/dL — ABNORMAL HIGH (ref 8–27)
CO2: 23 mmol/L (ref 20–29)
Calcium: 8.8 mg/dL (ref 8.7–10.3)
Chloride: 91 mmol/L — ABNORMAL LOW (ref 96–106)
Creatinine, Ser: 2.31 mg/dL — ABNORMAL HIGH (ref 0.57–1.00)
Glucose: 240 mg/dL — ABNORMAL HIGH (ref 70–99)
Potassium: 5 mmol/L (ref 3.5–5.2)
Sodium: 136 mmol/L (ref 134–144)
eGFR: 22 mL/min/{1.73_m2} — ABNORMAL LOW (ref >59)

## 2022-01-26 ENCOUNTER — Ambulatory Visit: Payer: PPO | Attending: Cardiology | Admitting: *Deleted

## 2022-01-26 DIAGNOSIS — I513 Intracardiac thrombosis, not elsewhere classified: Secondary | ICD-10-CM | POA: Diagnosis not present

## 2022-01-26 DIAGNOSIS — I4891 Unspecified atrial fibrillation: Secondary | ICD-10-CM

## 2022-01-26 DIAGNOSIS — Z5181 Encounter for therapeutic drug level monitoring: Secondary | ICD-10-CM

## 2022-01-26 LAB — POCT INR: INR: 3.5 — AB (ref 2.0–3.0)

## 2022-01-26 NOTE — Patient Instructions (Signed)
Hold warfarin tonight then resume 1 tablet daily  Eat extra greens today Recheck in 4 weeks -Call [phone] if started on any new medications.

## 2022-01-27 ENCOUNTER — Ambulatory Visit (INDEPENDENT_AMBULATORY_CARE_PROVIDER_SITE_OTHER): Payer: PPO | Admitting: Emergency Medicine

## 2022-01-27 ENCOUNTER — Encounter: Payer: Self-pay | Admitting: Emergency Medicine

## 2022-01-27 VITALS — BP 112/74 | HR 82 | Temp 98.2°F | Ht 63.0 in | Wt 128.4 lb

## 2022-01-27 DIAGNOSIS — R911 Solitary pulmonary nodule: Secondary | ICD-10-CM

## 2022-01-27 DIAGNOSIS — Z23 Encounter for immunization: Secondary | ICD-10-CM | POA: Diagnosis not present

## 2022-01-27 NOTE — Assessment & Plan Note (Signed)
3 mm punctate right upper lobe pulmonary nodule in a low risk patient.  In retrospect it was present on her PET scan that was done 12/31/2020.  Looks similar on that scan although the technique is different.  She should not need any further scanning to follow this nodule.  We could reimage if she has a clinical change.  Reassured her about this finding.  She would like to flu shot today we will arrange for this.

## 2022-01-27 NOTE — Progress Notes (Signed)
Subjective:    Patient ID: Emily [name], female    DOB: 16-Jun-1950, 71 y.o.   MRN: [id number]  HPI 71 year old never smoker with a history of atrial fibrillation on warfarin, history of viral myocarditis with cardiomyopathy and third-degree heart block with biventricular ICD/pacer, diabetes.  Chart review reveals that she underwent a PET scan 12/2020 to evaluate for possible pacer lead infection.  This was negative for any hypermetabolism.  She is referred today for evaluation of pulmonary nodule noted on her chest imaging. Today she reports that she began to have mid-sternal pain radiating L in mid October. She went to ED but had to leave before she was seen. Persisted and she was sent to ED 11/1 with CT as below. Her diuretics were increased, and her dyspnea improved, chest discomfort improved.   CT scan chest 12/29/2021 reviewed by me, shows no mediastinal or hilar adenopathy, small bilateral effusions with some associated subsegmental atelectasis.  There is a 3 mm right upper lobe pulmonary nodule.  The nodules present, similar in appearance on a PET scan done on 12/31/2020.  Review of Systems As per HPI  Past Medical History:  Diagnosis Date   Acute pain of right shoulder 03/22/2019   Acute renal failure ([place]) 04/15/2012   Last Assessment & Plan:  Superimposed on CK D stage III, Baseline Cr ~2-2.5, remains above baseline, felt to be due to Cardiorenal syndrome, creatinine trending down at time of discharge Negative urine eos and no urinary retention ACE inhibitor discontinued Follows with Dr Karen Chafe as outpatient, to have labs drawn 08/15/16 and results sent to [facility] office for review   Anemia 12/29/2016   Atrial fibrillation with RVR ([place])    Biventricular ICD (implantable cardioverter-defibrillator) in place    CHF (congestive heart failure) ([place])    CKD (chronic kidney disease), stage IV ([place])    Colitis 09/11/2012   Complete heart block ([place])    Essential hypertension    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 Physical exam revealed cerumen impaction of left ear canal. Relieved by flushing.    Hypomagnesemia 04/10/2016   Hyponatremia 04/10/2016   Last Assessment & Plan:  - improved after tolvaptan 6/29, chiefly driven by CHF, further improvement with ongoing IV Lasix - continue low Na and fluid restriction - compliance with sodium restriction a major barrier to keeping patient out of hospital and compensated, remains resistant to this philosophy while inpatient - follows with Dr Karen Chafe as outpatient, Neph consult not indicated at this ti   ICD (implantable cardioverter-defibrillator) battery depletion 04/21/2014   Localized macular rash 08/11/2016   Last Assessment & Plan:  Seems to trend with heart failure, though she has more findings behind bilateral knees and up into her thigh/groin area than when I last saw her Would use steroid cream for now, consider alternative diagnosis if no improvement   Long-term insulin use in type 2 diabetes ([place]) 04/10/2016   Last Assessment & Plan:  Hypoglycemia resolved, continue SSI and hypoglycemia protocol   Nonischemic cardiomyopathy ([place])    On amiodarone therapy 08/21/2017   Persistent atrial fibrillation ([place])    AV node ablation 2019   Restless leg syndrome 03/22/2019   Type 2 diabetes mellitus ([place])      Family History  Problem Relation Age of Onset   Hip  fracture Mother    COPD Father    CAD Sister    Heart attack Sister      Social History   Socioeconomic History   Marital status: Married    Spouse name: Not on file   Number of children: Not on file   Years of education: Not on file   Highest education level: Not on file  Occupational History   Not on file  Tobacco Use   Smoking status: Never   Smokeless tobacco: Never   Vaping Use   Vaping Use: Never used  Substance and Sexual Activity   Alcohol use: No   Drug use: No   Sexual activity: Not on file  Other Topics Concern   Not on file  Social History Narrative   Not on file   Social Determinants of Health   Financial Resource Strain: Not on file  Food Insecurity: Not on file  Transportation Needs: Not on file  Physical Activity: Not on file  Stress: Not on file  Social Connections: Not on file  Intimate Partner Violence: Not on file    From [place] Has worked as a Regulatory affairs officer, Charity fundraiser.    Allergies  Allergen Reactions   Amiodarone Rash   Codeine Other (See Comments)    Euphoria, hallucinations   Other Palpitations    PT STATES SHE GETS REALLY HOT , DIZZY HEADED, HAS PASSED OUT TWICE , AND ITCHING WITH IV DYE   Turmeric     Pt went into AFIB    Colchicine Rash   Iodine Rash    topical   Mexitil [Mexiletine] Rash     Outpatient Medications Prior to Visit  Medication Sig Dispense Refill   acetaminophen (TYLENOL) 500 MG tablet Take 1,000 mg by mouth every 6 (six) hours as needed for mild pain.     allopurinol (ZYLOPRIM) 100 MG tablet Take 100-200 mg by mouth See admin instructions. Take 200 mg in the morning 100 mg at bedtime     Cinnamon 500 MG capsule Take 500 mg by mouth daily.     empagliflozin (JARDIANCE) 10 MG TABS tablet Take 1 tablet (10 mg total) by mouth daily before breakfast. 30 tablet 11   furosemide (LASIX) 80 MG tablet Take 1 tablet (80 mg total) by mouth 2 (two) times daily. 180 tablet 3   Glucosamine-Chondroitin (OSTEO BI-FLEX REGULAR STRENGTH PO) Take 1 tablet by mouth daily.     levothyroxine (SYNTHROID) 88 MCG tablet TAKE 1 TABLET BY MOUTH DAILY BEFORE BREAKFAST. 30 tablet 11   loratadine (CLARITIN) 10 MG tablet Take 10 mg by mouth daily.     metoprolol succinate (TOPROL-XL) 50 MG 24 hr tablet Take 50 mg by mouth daily.     nitroGLYCERIN (NITROSTAT) 0.4 MG SL tablet Place 1 tablet (0.4 mg total) under the tongue every 5  (five) minutes as needed for chest pain. 90 tablet 3   Omega-3 Fatty Acids (FISH OIL) 1200 MG CPDR Take 1,200 mg by mouth daily.     omeprazole (PRILOSEC) 40 MG capsule TAKE 1 CAPSULE BY MOUTH ONCE DAILY. 90 capsule 3   potassium chloride SA (KLOR-CON M) 20 MEQ tablet Take 1 tablet (20 mEq total) by mouth daily. 30 tablet 6   sacubitril-valsartan (ENTRESTO) 24-26 MG Take 1 tablet by mouth 2 (two) times daily. 60 tablet 6   simvastatin (ZOCOR) 20 MG tablet TAKE 1 TABLET EVERY DAY AT 6 PM 30 tablet 5   spironolactone (ALDACTONE) 25 MG tablet Take 1 tablet (25 mg total)  by mouth daily. 90 tablet 3   TOUJEO SOLOSTAR 300 UNIT/ML Solostar Pen INJECT 24 UNITS INTO THE SKIN EVERY DAY 4.5 mL 5   warfarin (COUMADIN) 2 MG tablet TAKE 1 TO 2 TABLETS ONCE A DAY AS DIRECTED BY ANTICOAGULATION. (Patient taking differently: Take 2 mg by mouth daily.) 45 tablet 0   No facility-administered medications prior to visit.        Objective:   Physical Exam Vitals:   01/27/22 1357  BP: 112/74  Pulse: 82  Temp: 98.2 F (36.8 C)  TempSrc: Oral  SpO2: 99%  Weight: 128 lb 6.4 oz (58.2 kg)  Height: 5\' 3"  (1.6 m)    Gen: Pleasant, well-nourished, in no distress,  normal affect  ENT: No lesions,  mouth clear,  oropharynx clear, no postnasal drip  Neck: No JVD, no stridor  Lungs: No use of accessory muscles, no crackles or wheezing on normal respiration, no wheeze on forced expiration  Cardiovascular: RRR, heart sounds normal, no murmur or gallops, no peripheral edema  Musculoskeletal: No deformities, no cyanosis or clubbing  Neuro: alert, awake, non focal  Skin: Warm, no lesions or rash      Assessment & Plan:  Pulmonary nodule 3 mm punctate right upper lobe pulmonary nodule in a low risk patient.  In retrospect it was present on her PET scan that was done 12/31/2020.  Looks similar on that scan although the technique is different.  She should not need any further scanning to follow this nodule.  We  could reimage if she has a clinical change.  Reassured her about this finding.  She would like to flu shot today we will arrange for this.   Baltazar Apo, MD, PhD 01/27/2022, 2:16 PM [facility] [phone] or if no answer before 7:00PM call [phone] For any issues after 7:00PM please call eLink [phone]

## 2022-01-27 NOTE — Patient Instructions (Signed)
We reviewed your CT scan of the chest today. There is a small right upper lobe pulmonary nodule present should not need to be followed further.  Good news. Flu shot today. Follow with Dr. Lamonte Sakai if needed

## 2022-01-31 ENCOUNTER — Other Ambulatory Visit (HOSPITAL_COMMUNITY): Payer: Self-pay

## 2022-01-31 ENCOUNTER — Ambulatory Visit (INDEPENDENT_AMBULATORY_CARE_PROVIDER_SITE_OTHER): Payer: PPO

## 2022-01-31 DIAGNOSIS — Z95810 Presence of automatic (implantable) cardiac defibrillator: Secondary | ICD-10-CM

## 2022-01-31 DIAGNOSIS — I5022 Chronic systolic (congestive) heart failure: Secondary | ICD-10-CM | POA: Diagnosis not present

## 2022-02-02 NOTE — Progress Notes (Signed)
EPIC Encounter for ICM Monitoring  Patient Name: Emily [name] is a 71 y.o. female Date: 02/02/2022 Primary Care Physican: Ezequiel Essex, MD Primary Cardiologist: [facility] Electrophysiologist: Santina Evans Pacing: 96.4%  09/09/2021 Weight: 133-134 lbs 11/17/2021 Weight:  132 lbs            Spoke with patient and heart failure questions reviewed.  Transmission results reviewed.  Pt reports she is so dizzy and weak today and thinks it may be related to the increased Furosemide dosage to 80 mg bid.         Optivol Thoracic impedance suggesting possible dryness starting 11/28.    Prescribed: Furosemide 80 mg Take 1 tablet (80 mg total) by mouth twice a day starting 11/22.  She self adjusts Furosemide as needed.  Potassium 20 mEq Take 1 tablet by mouth daily.  Per 11/28 Lab note, Dr Aundra Dubin has instructed to stop Potassium but CMA unable to reach patient to provide recommendation.   Spironolactone 25 mg take 0.5 tablet by mouth daily   Labs: 01/24/2022 Creatinine 2.31, BUN 31, Potassium 5.0, Sodium 136, GFR 22 01/14/2022 Creatinine 2.20, BUN 29, Potassium 3.1, sodium 136, GFR 23 12/14/2021 Creatinine 2.11, BUN 33, Potassium 3.9, Sodium 131, GFR 24, Glucose 502 07/29/2021 Creatinine 1.80, BUN 25, Potassium 4.2, Sodium 140, GFR 30 07/15/2021 Creatinine 1.64, BUN 17, Potassium 3.3, Sodium 140  A complete set of results can be found in Results Review.   Recommendations:   Advised to stop Potassium per 11/28 lab note.  Advised to hold tonight's evening dosage of Furosemide and hold both dosages tomorrow as well.  Advised to return to 80 mg Furosemide twice a day on 12/8.  Advised will send to Dr Aundra Dubin for review and further recommendations if needed.     Follow-up plan: ICM clinic phone appointment on 02/08/2022 to recheck fluid levels.  91 day device clinic remote transmission 02/08/2022.      EP/Cardiology Office Visits:  Recall 08/05/2022 with Dr Lovena Le.  02/10/2022 with Dr Aundra Dubin.        Copy of ICM check sent to Dr. Lovena Le.   3 month ICM trend: 01/31/2022.    12-14 Month ICM trend:     [name], RN 02/02/2022 4:31 PM

## 2022-02-02 NOTE — Progress Notes (Signed)
When she restarts Lasix, restart it at 80 once daily rather than bid.  BMET 1 week and make sure she has followup soon in APP clinic.

## 2022-02-04 ENCOUNTER — Other Ambulatory Visit: Payer: Self-pay

## 2022-02-04 DIAGNOSIS — I5022 Chronic systolic (congestive) heart failure: Secondary | ICD-10-CM

## 2022-02-04 DIAGNOSIS — Z95810 Presence of automatic (implantable) cardiac defibrillator: Secondary | ICD-10-CM

## 2022-02-04 MED ORDER — FUROSEMIDE 80 MG PO TABS: 80.0000 mg | ORAL_TABLET | Freq: Every day | ORAL | 3 refills | Status: DC

## 2022-02-04 NOTE — Progress Notes (Signed)
Spoke with patient and she reports dizziness has improved but not resolved.  She held evening Lasix dosage on 12/6 and both dosages on 12/7.  She has also stopped Potassium dosage as Dr Aundra Dubin advised in lab note.    Advised Dr Aundra Dubin ordered to change Lasix 80 mg to once a day only and to have labs drawn next week.  She verbalized understanding and repeated instructions back correctly.  Will recheck fluid levels on 12/12.  Explained she should expect call from  HF clinic due to Dr Aundra Dubin recommended she been seen in the clinic as soon as APP has availability.    Advised to use ER if symptoms persist or worsen.     She will have labs drawn at [place] in a week, approximately 12/14.

## 2022-02-07 ENCOUNTER — Other Ambulatory Visit (HOSPITAL_COMMUNITY): Payer: Self-pay

## 2022-02-08 ENCOUNTER — Ambulatory Visit (INDEPENDENT_AMBULATORY_CARE_PROVIDER_SITE_OTHER): Payer: PPO

## 2022-02-08 ENCOUNTER — Telehealth: Payer: Self-pay

## 2022-02-08 DIAGNOSIS — Z95810 Presence of automatic (implantable) cardiac defibrillator: Secondary | ICD-10-CM

## 2022-02-08 DIAGNOSIS — I5022 Chronic systolic (congestive) heart failure: Secondary | ICD-10-CM

## 2022-02-08 DIAGNOSIS — I420 Dilated cardiomyopathy: Secondary | ICD-10-CM | POA: Diagnosis not present

## 2022-02-08 LAB — CUP PACEART REMOTE DEVICE CHECK
Battery Remaining Longevity: 104 mo
Battery Voltage: 3.03 V
Brady Statistic AP VP Percent: 0 %
Brady Statistic AP VS Percent: 0 %
Brady Statistic AS VP Percent: 0 %
Brady Statistic AS VS Percent: 0 %
Brady Statistic RA Percent Paced: 0 %
Brady Statistic RV Percent Paced: 89 %
Date Time Interrogation Session: 20231212033623
HighPow Impedance: 76 Ohm
Implantable Lead Connection Status: 753985
Implantable Lead Connection Status: 753985
Implantable Lead Connection Status: 753985
Implantable Lead Implant Date: 20050218
Implantable Lead Implant Date: 20050218
Implantable Lead Implant Date: 20050218
Implantable Lead Location: 753858
Implantable Lead Location: 753859
Implantable Lead Location: 753860
Implantable Lead Model: 4194
Implantable Lead Model: 5076
Implantable Lead Model: 6947
Implantable Pulse Generator Implant Date: 20230313
Lead Channel Impedance Value: 380 Ohm
Lead Channel Impedance Value: 399 Ohm
Lead Channel Impedance Value: 437 Ohm
Lead Channel Impedance Value: 646 Ohm
Lead Channel Impedance Value: 665 Ohm
Lead Channel Impedance Value: 950 Ohm
Lead Channel Pacing Threshold Amplitude: 1 V
Lead Channel Pacing Threshold Amplitude: 1.25 V
Lead Channel Pacing Threshold Pulse Width: 0.4 ms
Lead Channel Pacing Threshold Pulse Width: 0.4 ms
Lead Channel Sensing Intrinsic Amplitude: 1 mV
Lead Channel Sensing Intrinsic Amplitude: 5.625 mV
Lead Channel Sensing Intrinsic Amplitude: 5.625 mV
Lead Channel Setting Pacing Amplitude: 2 V
Lead Channel Setting Pacing Amplitude: 2.25 V
Lead Channel Setting Pacing Pulse Width: 0.4 ms
Lead Channel Setting Pacing Pulse Width: 0.4 ms
Lead Channel Setting Sensing Sensitivity: 0.3 mV
Zone Setting Status: 755011

## 2022-02-08 NOTE — Telephone Encounter (Signed)
Remote ICM transmission received.  Attempted call to patient regarding ICM remote transmission and no answer or voice mail message.   

## 2022-02-08 NOTE — Progress Notes (Signed)
EPIC Encounter for ICM Monitoring  Patient Name: Emily [name] is a 71 y.o. female Date: 02/08/2022 Primary Care Physican: Ezequiel Essex, MD Primary Cardiologist: [facility] Electrophysiologist: Santina Evans Pacing: 89.0% (Since 31-Jan-2022) 09/09/2021 Weight: 133-134 lbs 11/17/2021 Weight:  132 lbs            Attempted call to patient and unable to reach.   Transmission reviewed.          Optivol Thoracic impedance suggesting fluid levels returned close to normal.    Prescribed: Furosemide 80 mg Take 1 tablet (80 mg total) by mouth once a day.  She self adjusts Furosemide as needed.  Spironolactone 25 mg take 0.5 tablet by mouth daily   Labs: 01/24/2022 Creatinine 2.31, BUN 31, Potassium 5.0, Sodium 136, GFR 22 01/14/2022 Creatinine 2.20, BUN 29, Potassium 3.1, sodium 136, GFR 23 12/14/2021 Creatinine 2.11, BUN 33, Potassium 3.9, Sodium 131, GFR 24, Glucose 502 07/29/2021 Creatinine 1.80, BUN 25, Potassium 4.2, Sodium 140, GFR 30 07/15/2021 Creatinine 1.64, BUN 17, Potassium 3.3, Sodium 140  A complete set of results can be found in Results Review.   Recommendations:   Unable to reach.      Follow-up plan: ICM clinic phone appointment on 03/14/2022.  91 day device clinic remote transmission 05/10/2022.      EP/Cardiology Office Visits:  Recall 08/05/2022 with Dr Lovena Le.  02/10/2022 with Dr Aundra Dubin.       Copy of ICM check sent to Dr. Lovena Le.   3 month ICM trend: 02/08/2022.    12-14 Month ICM trend:     [name], RN 02/08/2022 8:04 AM

## 2022-02-09 ENCOUNTER — Telehealth (HOSPITAL_COMMUNITY): Payer: Self-pay

## 2022-02-09 DIAGNOSIS — I5022 Chronic systolic (congestive) heart failure: Secondary | ICD-10-CM

## 2022-02-09 NOTE — Telephone Encounter (Signed)
-----   Message from Larey Dresser, MD sent at 01/25/2022  4:09 PM EST ----- K high normal, can stop the additional KCl 20 that we had her start after 11/17 BMET.  Repeat BMET 2 wks.

## 2022-02-09 NOTE — Telephone Encounter (Signed)
Patient advised and verbalized understanding. Labs will be done in [place], lab order entered.   Orders Placed This Encounter  Procedures   Basic metabolic panel    Standing Status:   Future    Standing Expiration Date:   02/10/2023    Order Specific Question:   Release to patient    Answer:   Immediate    Order Specific Question:   Release to patient    Answer:   Immediate [1]

## 2022-02-10 ENCOUNTER — Other Ambulatory Visit (HOSPITAL_COMMUNITY): Payer: PPO

## 2022-02-10 ENCOUNTER — Encounter (HOSPITAL_COMMUNITY): Payer: PPO | Admitting: Cardiology

## 2022-03-04 ENCOUNTER — Other Ambulatory Visit (HOSPITAL_COMMUNITY): Payer: Self-pay | Admitting: *Deleted

## 2022-03-04 MED ORDER — FUROSEMIDE 40 MG PO TABS: 80.0000 mg | ORAL_TABLET | Freq: Every day | ORAL | 3 refills | Status: DC

## 2022-03-08 ENCOUNTER — Other Ambulatory Visit (HOSPITAL_COMMUNITY): Payer: Self-pay

## 2022-03-09 NOTE — Progress Notes (Signed)
Remote ICD transmission.   

## 2022-03-17 ENCOUNTER — Ambulatory Visit: Payer: PPO | Attending: Cardiology | Admitting: *Deleted

## 2022-03-17 DIAGNOSIS — Z5181 Encounter for therapeutic drug level monitoring: Secondary | ICD-10-CM | POA: Diagnosis not present

## 2022-03-17 DIAGNOSIS — I4891 Unspecified atrial fibrillation: Secondary | ICD-10-CM

## 2022-03-17 DIAGNOSIS — I513 Intracardiac thrombosis, not elsewhere classified: Secondary | ICD-10-CM

## 2022-03-17 LAB — POCT INR: INR: 4.8 — AB (ref 2.0–3.0)

## 2022-03-17 NOTE — Patient Instructions (Signed)
Hold warfarin tonight and tomorrow night then decrease dose to 1 tablet daily except 1/2 tablet on Sundays and Wednesdays  Eat extra greens today Recheck in 2 weeks -Call [phone] if started on any new medications.

## 2022-03-22 ENCOUNTER — Other Ambulatory Visit (HOSPITAL_COMMUNITY): Payer: Self-pay | Admitting: *Deleted

## 2022-03-22 MED ORDER — FUROSEMIDE 80 MG PO TABS: 80.0000 mg | ORAL_TABLET | Freq: Every day | ORAL | 6 refills | Status: DC

## 2022-03-29 ENCOUNTER — Ambulatory Visit: Payer: PPO | Attending: Internal Medicine

## 2022-03-29 DIAGNOSIS — Z95810 Presence of automatic (implantable) cardiac defibrillator: Secondary | ICD-10-CM

## 2022-03-29 DIAGNOSIS — I5022 Chronic systolic (congestive) heart failure: Secondary | ICD-10-CM

## 2022-03-29 NOTE — Progress Notes (Signed)
EPIC Encounter for ICM Monitoring  Patient Name: Emily [name] is a 72 y.o. female Date: 03/29/2022 Primary Care Physican: Ezequiel Essex, MD Primary Cardiologist: [facility] Electrophysiologist: Santina Evans Pacing: 83.6% (08-Feb-2022 to 29-Mar-2022) 09/09/2021 Weight: 133-134 lbs 11/17/2021 Weight:  132 lbs 03/29/2022 Weight: 131 lbs    VT-NS (>4 beats, >150 bpm) 10         Spoke with patient and heart failure questions reviewed.  Transmission results reviewed.  Pt asymptomatic for fluid accumulation.  Reports feeling well at this time and voices no complaints.           Optivol Thoracic impedance suggesting possible fluid accumulation starting 12/20.  Fluid index higher than normal threshold starting 12/31.   Prescribed: Furosemide 80 mg Take 1 tablet (80 mg total) by mouth once a day.  She self adjusts Furosemide as needed.  Spironolactone 25 mg take 0.5 tablet by mouth daily   Labs: 01/24/2022 Creatinine 2.31, BUN 31, Potassium 5.0, Sodium 136, GFR 22 01/14/2022 Creatinine 2.20, BUN 29, Potassium 3.1, sodium 136, GFR 23 12/14/2021 Creatinine 2.11, BUN 33, Potassium 3.9, Sodium 131, GFR 24, Glucose 502 07/29/2021 Creatinine 1.80, BUN 25, Potassium 4.2, Sodium 140, GFR 30 07/15/2021 Creatinine 1.64, BUN 17, Potassium 3.3, Sodium 140  A complete set of results can be found in Results Review.   Recommendations:   Pt will self adjust Furosemide and take extra 80 mg in the afternoon for 2-3 days.  Copy sent to Dr Aundra Dubin for review and any further recommendations.     Follow-up plan: ICM clinic phone appointment on 04/04/2022 (manual).  91 day device clinic remote transmission 05/10/2022.      EP/Cardiology Office Visits:  Recall 08/05/2022 with Dr Lovena Le.  04/25/2022 with Dr Aundra Dubin.       Copy of ICM check sent to Dr. Lovena Le.   3 month ICM trend: 03/29/2022.    12-14 Month ICM trend:     [name], RN 03/29/2022 9:28 AM

## 2022-03-31 ENCOUNTER — Ambulatory Visit: Payer: PPO | Attending: Cardiology | Admitting: *Deleted

## 2022-03-31 DIAGNOSIS — Z5181 Encounter for therapeutic drug level monitoring: Secondary | ICD-10-CM | POA: Diagnosis not present

## 2022-03-31 DIAGNOSIS — I513 Intracardiac thrombosis, not elsewhere classified: Secondary | ICD-10-CM | POA: Diagnosis not present

## 2022-03-31 DIAGNOSIS — I4891 Unspecified atrial fibrillation: Secondary | ICD-10-CM | POA: Diagnosis not present

## 2022-03-31 LAB — POCT INR: INR: 1.6 — AB (ref 2.0–3.0)

## 2022-03-31 NOTE — Patient Instructions (Signed)
Take warfarin 2 tablets tonight then increase dose to 1 tablet daily except 1/2 tablet on Wednesdays  Recheck in 3 weeks -Call [phone] if started on any new medications.

## 2022-04-04 ENCOUNTER — Ambulatory Visit: Payer: PPO | Attending: Internal Medicine

## 2022-04-04 DIAGNOSIS — I5022 Chronic systolic (congestive) heart failure: Secondary | ICD-10-CM

## 2022-04-04 DIAGNOSIS — Z95810 Presence of automatic (implantable) cardiac defibrillator: Secondary | ICD-10-CM

## 2022-04-04 MED ORDER — FUROSEMIDE 80 MG PO TABS: ORAL_TABLET | ORAL | 1 refills | Status: DC

## 2022-04-04 NOTE — Progress Notes (Signed)
Spoke with patient and advised Dr Aundra Dubin recommended she continue to take Lasix 80 qam/40 qpm for now and get BMET later this week. Will need CHF clinic followup.   Pt will have lab drawn at Nephrologist office on 2/12 and will advise that office that a BMET is needed and to fax results immediately to Dr Aundra Dubin.   Advised HF clinic will call her to make an appointment.  She verbalized understanding and agreed to recommendations.

## 2022-04-04 NOTE — Progress Notes (Signed)
Keep Lasix 80 qam/40 qpm for now and get BMET later this week.  Will need CHF clinic followup.

## 2022-04-04 NOTE — Progress Notes (Signed)
EPIC Encounter for ICM Monitoring  Patient Name: Emily [name] is a 72 y.o. female Date: 04/04/2022 Primary Care Physican: Ezequiel Essex, MD Primary Cardiologist: [facility] Electrophysiologist: Lovena Le Nephrologist: unsure Bi-V Pacing: 88.4% (Since 29-Mar-2022) 09/09/2021 Weight: 133-134 lbs 11/17/2021 Weight:  132 lbs 03/29/2022 Weight: 131 lbs 04/04/2022 Weight:  128 lbs    VT-NS (>4 beats, >150 bpm)    2         Spoke with patient and heart failure questions reviewed.  Transmission results reviewed.  Pt asymptomatic for fluid accumulation.  Reports feeling well at this time and voices no complaints.           Optivol Thoracic impedance suggesting possible fluid accumulation continues after taking extra 40 mg Furosemide in the afternoon x 2 days (possible fluid accumulation started 12/20).  Fluid index higher than normal threshold starting 12/31.   Prescribed: Furosemide 80 mg Take 1 tablet (80 mg total) by mouth once a day.  She self adjusts Furosemide as needed.  Spironolactone 25 mg take 0.5 tablet by mouth daily   Labs: 01/24/2022 Creatinine 2.31, BUN 31, Potassium 5.0, Sodium 136, GFR 22 01/14/2022 Creatinine 2.20, BUN 29, Potassium 3.1, sodium 136, GFR 23 12/14/2021 Creatinine 2.11, BUN 33, Potassium 3.9, Sodium 131, GFR 24, Glucose 502 07/29/2021 Creatinine 1.80, BUN 25, Potassium 4.2, Sodium 140, GFR 30 07/15/2021 Creatinine 1.64, BUN 17, Potassium 3.3, Sodium 140  A complete set of results can be found in Results Review.   Recommendations:   Copy sent to Dr Aundra Dubin for review and any further recommendations.  Minimal change after self adjusting Furosemide and taking extra 40 mg x 2 evenings   Follow-up plan: ICM clinic phone appointment on 04/11/2022.  91 day device clinic remote transmission 05/10/2022.      EP/Cardiology Office Visits:  Recall 08/05/2022 with Dr Lovena Le.  04/25/2022 with Dr Aundra Dubin.       Copy of ICM check sent to Dr. Lovena Le.    3 month ICM trend:  04/04/2022.    12-14 Month ICM trend:     [name], RN 04/04/2022 3:40 PM

## 2022-04-11 ENCOUNTER — Ambulatory Visit: Payer: PPO

## 2022-04-11 ENCOUNTER — Other Ambulatory Visit: Payer: Self-pay | Admitting: Family Medicine

## 2022-04-11 ENCOUNTER — Other Ambulatory Visit: Payer: Self-pay | Admitting: Cardiology

## 2022-04-11 ENCOUNTER — Other Ambulatory Visit (HOSPITAL_COMMUNITY): Payer: Self-pay

## 2022-04-11 DIAGNOSIS — Z5181 Encounter for therapeutic drug level monitoring: Secondary | ICD-10-CM

## 2022-04-11 DIAGNOSIS — I4821 Permanent atrial fibrillation: Secondary | ICD-10-CM

## 2022-04-11 DIAGNOSIS — K219 Gastro-esophageal reflux disease without esophagitis: Secondary | ICD-10-CM

## 2022-04-11 DIAGNOSIS — Z95810 Presence of automatic (implantable) cardiac defibrillator: Secondary | ICD-10-CM

## 2022-04-11 DIAGNOSIS — I5022 Chronic systolic (congestive) heart failure: Secondary | ICD-10-CM

## 2022-04-12 ENCOUNTER — Telehealth: Payer: Self-pay

## 2022-04-12 NOTE — Telephone Encounter (Signed)
Spoke with Lattie Haw at [facility] and requested 12/12 BMET results to be faxed to Dr Claris Gladden office and provided fax number.

## 2022-04-12 NOTE — Progress Notes (Signed)
Looks like we are going in right direction, continue current diuretics and recheck next week.

## 2022-04-12 NOTE — Progress Notes (Signed)
Spoke with patient and advised Dr Aundra Dubin wants to continue current dosage of Furosemide and will recheck fluid level report next week.  Advised to limit salt intake.

## 2022-04-12 NOTE — Progress Notes (Signed)
EPIC Encounter for ICM Monitoring  Patient Name: Emily [name] is a 72 y.o. female Date: 04/12/2022 Primary Care Physican: Ezequiel Essex, MD Primary Cardiologist: [facility] Electrophysiologist: Lovena Le Nephrologist: [facility]-Dr Clover Mealy  Bi-V Pacing: 88.9% (Since 04-Apr-2022) 09/09/2021 Weight: 133-134 lbs 11/17/2021 Weight:  132 lbs 03/29/2022 Weight: 131 lbs 04/04/2022 Weight:  128 lbs    VT-NS (>4 beats, >150 bpm)    2         Spoke with patient and heart failure questions reviewed.  Transmission results reviewed.  Pt asymptomatic for fluid accumulation.  Reports feeling well and does not feel like she has any fluids.  Pt reports no changes in diet or med and unsure why she is retaining fluid.  Pt confirmed she had BMET drawn at Nephrologist office 12/12.         Optivol Thoracic impedance suggesting improvement after continuing Furosemide 80 mg AM/40 mg PM since 2/2.   Fluid index higher than normal threshold starting 12/31.   Prescribed: Furosemide 80 mg Take 1 tablet (80 mg total) by mouth every morning and 0.5 tablet (40 mg total) every evening.   Spironolactone 25 mg take 0.5 tablet by mouth daily   Labs: 04/11/2022 Spoke to [facility] and request to fax 12/12 BMET results today, 12/13 to Dr Claris Gladden office. 01/24/2022 Creatinine 2.31, BUN 31, Potassium 5.0, Sodium 136, GFR 22 01/14/2022 Creatinine 2.20, BUN 29, Potassium 3.1, sodium 136, GFR 23 12/14/2021 Creatinine 2.11, BUN 33, Potassium 3.9, Sodium 131, GFR 24, Glucose 502 07/29/2021 Creatinine 1.80, BUN 25, Potassium 4.2, Sodium 140, GFR 30 07/15/2021 Creatinine 1.64, BUN 17, Potassium 3.3, Sodium 140  A complete set of results can be found in Results Review.   Recommendations:   Copy sent to Dr Aundra Dubin for review and any further recommendations.  Optivol Improvement after after taking Furosemide 80/40 since 2/2.     Follow-up plan: ICM clinic phone appointment on 04/19/2022 to  recheck fluid levels.  91 day device clinic remote transmission 05/10/2022.      EP/Cardiology Office Visits:  Recall 08/05/2022 with Dr Lovena Le.  04/25/2022 with Dr Aundra Dubin.       Copy of ICM check sent to Dr. Lovena Le.    3 month ICM trend: 04/11/2022.    12-14 Month ICM trend:     [name], RN 04/12/2022 1:59 PM

## 2022-04-12 NOTE — Telephone Encounter (Signed)
Refill request for warfarin:  Last INR was 1.6 on 03/31/22 Next INR due 04/21/22 LOV was 01/14/22  Einar Crow MD  Refill approved.

## 2022-04-19 ENCOUNTER — Ambulatory Visit: Payer: PPO | Attending: Internal Medicine

## 2022-04-19 DIAGNOSIS — Z95810 Presence of automatic (implantable) cardiac defibrillator: Secondary | ICD-10-CM

## 2022-04-19 DIAGNOSIS — I5022 Chronic systolic (congestive) heart failure: Secondary | ICD-10-CM

## 2022-04-19 NOTE — Progress Notes (Unsigned)
EPIC Encounter for ICM Monitoring  Patient Name: Emily [name] is a 72 y.o. female Date: 04/19/2022 Primary Care Physican: Ezequiel Essex, MD Primary Cardiologist: [facility] Electrophysiologist: Lovena Le Nephrologist: [facility]-Dr Clover Mealy  Bi-V Pacing: 88.9% (Since 04-Apr-2022) 09/09/2021 Weight: 133-134 lbs 11/17/2021 Weight:  132 lbs 03/29/2022 Weight: 131 lbs 04/04/2022 Weight:  128 lbs 04/19/2022 Weight: 128 lbs    VT-NS (>4 beats, >150 bpm)    2   Spoke with patient and heart failure questions reviewed.  Transmission results reviewed.  Pt asymptomatic for fluid accumulation.  Reports feeling well at this time and voices no complaints.  04/11/2022 Spoke to [employer] and request to fax 12/12 BMET results today, 12/13 to Dr Claris Gladden office.   No BMET results sent to Dr Oleh Genin office as requested on 2/12.          Optivol Thoracic impedance trending closer to baseline since taking  Furosemide 80 mg AM/40 mg PM dosage since 2/2.   Fluid index higher than normal threshold starting 12/31.   Prescribed: Furosemide 80 mg Take 1 tablet (80 mg total) by mouth every morning and 0.5 tablet (40 mg total) every evening.   Spironolactone 25 mg take 0.5 tablet by mouth daily   Labs:   01/24/2022 Creatinine 2.31, BUN 31, Potassium 5.0, Sodium 136, GFR 22 01/14/2022 Creatinine 2.20, BUN 29, Potassium 3.1, sodium 136, GFR 23 12/14/2021 Creatinine 2.11, BUN 33, Potassium 3.9, Sodium 131, GFR 24, Glucose 502 07/29/2021 Creatinine 1.80, BUN 25, Potassium 4.2, Sodium 140, GFR 30 07/15/2021 Creatinine 1.64, BUN 17, Potassium 3.3, Sodium 140  A complete set of results can be found in Results Review.   Recommendations:   Optivol continues to improve since taking Furosemide 80/40 since 2/2.     Follow-up plan: ICM clinic phone appointment on 05/02/2022.  91 day device clinic remote transmission 05/10/2022.      EP/Cardiology Office Visits:  Recall 08/05/2022 with Dr  Lovena Le.  04/25/2022 with Dr Aundra Dubin.       Copy of ICM check sent to Dr. Lovena Le.  3 month ICM trend: 04/19/2022.    12-14 Month ICM trend:     [name], RN 04/19/2022 11:13 AM

## 2022-04-20 ENCOUNTER — Encounter: Payer: Self-pay | Admitting: Nephrology

## 2022-04-20 LAB — BASIC METABOLIC PANEL
BUN/Creatinine Ratio: 15 (ref 12–28)
BUN: 32 mg/dL — ABNORMAL HIGH (ref 8–27)
CO2: 23 mmol/L (ref 20–29)
Calcium: 8.6 mg/dL — ABNORMAL LOW (ref 8.7–10.3)
Chloride: 94 mmol/L — ABNORMAL LOW (ref 96–106)
Creatinine, Ser: 2.12 mg/dL — ABNORMAL HIGH (ref 0.57–1.00)
Glucose: 312 mg/dL — ABNORMAL HIGH (ref 70–99)
Potassium: 4 mmol/L (ref 3.5–5.2)
Sodium: 136 mmol/L (ref 134–144)
eGFR: 24 mL/min/{1.73_m2} — ABNORMAL LOW (ref >59)

## 2022-04-20 MED ORDER — FUROSEMIDE 80 MG PO TABS: 80.0000 mg | ORAL_TABLET | Freq: Two times a day (BID) | ORAL | 1 refills | Status: DC

## 2022-04-20 NOTE — Progress Notes (Signed)
Spoke with patient and advised Dr Aundra Dubin recommended to take Furosemide 80 mg 1 tablet by mouth twice a day.  She will have lab drawn at 2/26 OV with Dr Aundra Dubin.  She verbalized understanding and agrees to recommendations.

## 2022-04-20 NOTE — Progress Notes (Signed)
She can increase Lasix to 80 mg bid for now with BMET in 1 week.

## 2022-04-21 ENCOUNTER — Ambulatory Visit: Payer: PPO

## 2022-04-25 ENCOUNTER — Encounter (HOSPITAL_COMMUNITY): Payer: Self-pay | Admitting: Cardiology

## 2022-04-25 ENCOUNTER — Other Ambulatory Visit (HOSPITAL_COMMUNITY): Payer: Self-pay

## 2022-04-25 ENCOUNTER — Inpatient Hospital Stay (HOSPITAL_COMMUNITY)
Admission: RE | Admit: 2022-04-25 | Discharge: 2022-04-25 | Disposition: A | Discharge status: Home / Self Care | Payer: PPO | Source: Ambulatory Visit | Attending: Cardiology | Admitting: Cardiology

## 2022-04-25 ENCOUNTER — Ambulatory Visit (HOSPITAL_COMMUNITY)
Admission: RE | Admit: 2022-04-25 | Discharge: 2022-04-25 | Disposition: A | Discharge status: Home / Self Care | Payer: PPO | Source: Ambulatory Visit | Attending: Cardiology | Admitting: Cardiology

## 2022-04-25 ENCOUNTER — Ambulatory Visit (INDEPENDENT_AMBULATORY_CARE_PROVIDER_SITE_OTHER): Payer: PPO | Admitting: Cardiology

## 2022-04-25 VITALS — HR 74 | Wt 131.2 lb

## 2022-04-25 DIAGNOSIS — N184 Chronic kidney disease, stage 4 (severe): Secondary | ICD-10-CM | POA: Diagnosis not present

## 2022-04-25 DIAGNOSIS — I493 Ventricular premature depolarization: Secondary | ICD-10-CM | POA: Diagnosis not present

## 2022-04-25 DIAGNOSIS — Z7984 Long term (current) use of oral hypoglycemic drugs: Secondary | ICD-10-CM | POA: Insufficient documentation

## 2022-04-25 DIAGNOSIS — I4819 Other persistent atrial fibrillation: Secondary | ICD-10-CM

## 2022-04-25 DIAGNOSIS — I4821 Permanent atrial fibrillation: Secondary | ICD-10-CM | POA: Insufficient documentation

## 2022-04-25 DIAGNOSIS — I2510 Atherosclerotic heart disease of native coronary artery without angina pectoris: Secondary | ICD-10-CM | POA: Insufficient documentation

## 2022-04-25 DIAGNOSIS — Z79890 Hormone replacement therapy (postmenopausal): Secondary | ICD-10-CM | POA: Diagnosis not present

## 2022-04-25 DIAGNOSIS — M109 Gout, unspecified: Secondary | ICD-10-CM | POA: Insufficient documentation

## 2022-04-25 DIAGNOSIS — I428 Other cardiomyopathies: Secondary | ICD-10-CM | POA: Insufficient documentation

## 2022-04-25 DIAGNOSIS — I4891 Unspecified atrial fibrillation: Secondary | ICD-10-CM

## 2022-04-25 DIAGNOSIS — E039 Hypothyroidism, unspecified: Secondary | ICD-10-CM | POA: Insufficient documentation

## 2022-04-25 DIAGNOSIS — R002 Palpitations: Secondary | ICD-10-CM

## 2022-04-25 DIAGNOSIS — Z95810 Presence of automatic (implantable) cardiac defibrillator: Secondary | ICD-10-CM | POA: Diagnosis not present

## 2022-04-25 DIAGNOSIS — Z7901 Long term (current) use of anticoagulants: Secondary | ICD-10-CM | POA: Insufficient documentation

## 2022-04-25 DIAGNOSIS — Q211 Atrial septal defect, unspecified: Secondary | ICD-10-CM | POA: Diagnosis not present

## 2022-04-25 DIAGNOSIS — E1122 Type 2 diabetes mellitus with diabetic chronic kidney disease: Secondary | ICD-10-CM | POA: Insufficient documentation

## 2022-04-25 DIAGNOSIS — I350 Nonrheumatic aortic (valve) stenosis: Secondary | ICD-10-CM | POA: Diagnosis not present

## 2022-04-25 DIAGNOSIS — Z794 Long term (current) use of insulin: Secondary | ICD-10-CM | POA: Insufficient documentation

## 2022-04-25 DIAGNOSIS — Z5181 Encounter for therapeutic drug level monitoring: Secondary | ICD-10-CM | POA: Diagnosis not present

## 2022-04-25 DIAGNOSIS — Z79899 Other long term (current) drug therapy: Secondary | ICD-10-CM | POA: Diagnosis not present

## 2022-04-25 DIAGNOSIS — I5022 Chronic systolic (congestive) heart failure: Secondary | ICD-10-CM | POA: Insufficient documentation

## 2022-04-25 DIAGNOSIS — I513 Intracardiac thrombosis, not elsewhere classified: Secondary | ICD-10-CM

## 2022-04-25 LAB — BASIC METABOLIC PANEL
Anion gap: 17 — ABNORMAL HIGH (ref 5–15)
BUN: 43 mg/dL — ABNORMAL HIGH (ref 8–23)
CO2: 20 mmol/L — ABNORMAL LOW (ref 22–32)
Calcium: 8.3 mg/dL — ABNORMAL LOW (ref 8.9–10.3)
Chloride: 95 mmol/L — ABNORMAL LOW (ref 98–111)
Creatinine, Ser: 2.43 mg/dL — ABNORMAL HIGH (ref 0.44–1.00)
GFR, Estimated: 20 mL/min — ABNORMAL LOW (ref >60)
Glucose, Bld: 220 mg/dL — ABNORMAL HIGH (ref 70–99)
Potassium: 3.9 mmol/L (ref 3.5–5.1)
Sodium: 132 mmol/L — ABNORMAL LOW (ref 135–145)

## 2022-04-25 LAB — BRAIN NATRIURETIC PEPTIDE: B Natriuretic Peptide: 667.1 pg/mL — ABNORMAL HIGH (ref 0.0–100.0)

## 2022-04-25 LAB — PROTIME-INR
INR: 2 — ABNORMAL HIGH (ref 0.8–1.2)
Prothrombin Time: 22.2 seconds — ABNORMAL HIGH (ref 11.4–15.2)

## 2022-04-25 MED ORDER — FUROSEMIDE 80 MG PO TABS: ORAL_TABLET | ORAL | 3 refills | Status: DC

## 2022-04-25 NOTE — H&P (View-Only) (Signed)
PCP: Ezequiel Essex, MD Cardiology: Dr. Domenic Polite HF Cardiology: Dr. Aundra Dubin EP: Dr. Lovena Le   72 y.o. with history of CKD stage IV, permanent atrial fibrillation, and a long-standing nonischemic cardiomyopathy was referred by Dr. Domenic Polite for CHF clinic evaluation.  She first developed cardiac problems in 1988.  At that time, she was admitted with CHF and eventually underwent myocardial biopsy at [facility], per her report showing myocarditis.  Patient states that her EF has been low since then.  She had MDT CRT-D device implanted.  Echo in 7/21 showed EF 20-25%, moderate MR, mild AS. She is now in permanent atrial fibrillation and is s/p AV nodal ablation with BiV pacing. Elevated creatinine has limited medication management.    Echo in 10/22 showed EF 25-30%, severe LV dilation, RV normal, moderate TR, mod-severe AS with mean gradient 9 mmHg but AVA 0.76 cm^2 (?low gradient severe AS), mild-moderate MR, left=>right shunt possible ASD.  TEE done in 10/22 to more closely assess AS.  This showed EF 30%, moderate LV dilation, normal RV, possible calcified vegetation on the ICD lead, small secundum ASD, peak RV-RA gradient 23 mmHg, low flow/low gradient moderate AS, mild-moderate MR. RHC/LHC was done, showing mild nonobstructive CAD, left to right atrial level shunt with Qp/Qs 1.43.  I reviewed TEE and cath report with Dr. Burt Knack.  We decided that AS was not severe enough yet for TAVR and that the ASD was not significant enough to close percutaneously. With possible calcified vegetation on ICD, we drew blood cultures which were negative.  She saw ID and had a PET-CT, which did not suggest active endocarditis.   Patient was found on Zio patch in 10/22 to have 12.6% PVCs, this was thought to be limiting her BiV pacing.  She has a history of intolerance of amiodarone so was started on mexiletine.  She says that this caused a rash so she stopped it and rash resolved. She also decreased Toprol XL to 100 mg daily due to  nightmares when taking 150 mg daily.  She subsequently has decreased Toprol XL to 50 mg daily.   Echo in 10/23 showed EF 25-30%, severe LV dilation, normal RV, mild MR, moderate AS with AVA 1.1 cm^2, mean gradient 9 mmHg (low flow low gradient moderate AS).   She presents today for followup of CHF.  She increased Lasix recently for decreased thoracic impedance noted on her Medtronic device. She reports an episode of lightheadedness last Thursday and then loss of consciousness Friday, both episodes occurring after standing up.  She did not have an ICD shock but she was noted to have NSVT both Thursday and Friday.  She thinks she passed out due to dehydration from too much Lasix.  She decreased her Lasix dose from 80 mg bid to 80 qam/40 qpm. Her BiV pacing percentage has dropped significantly (around 75%).  She has not felt palpitations.  She has been short of breath with moderate activity such as housework.  Ok walking on flat ground. +Cough.  No orthopnea/PND. She has had a chronic cough.   ECG (personally reviewed): atrial fibrillation with BiV pacing  REDS clip 22%  Labs (1/21): LDL 118 Labs (9/21): K 4.4, creatinine 1.93 Labs (12/21): K 4, creatinine 1.77 Labs (2/22): TSH normal Labs (5/22): K 5, creatinine 2.42 Labs (9/22): K 4.3, creatinine 1.73, LDL 90 Labs (10/22): K 4.2, creatinine 2.18 Labs (2/23): K 3.8, creatinine 2.42 Labs (11/23): K 3.6, creatinine 2.22  Medtronic device interrogation: Around 75% BiV pacing, decreased thoracic impedance, several  episodes of NSVT, no sustained VT/VF or shock.   PMH: 1. Gout 2. Complete heart block: Medtronic CRT device.  3. CKD stage IV: Diabetic nephropathy.  4. Type 2 diabetes 5. Atrial fibrillation: Now permanent.   - AF ablation 4/18.  - AV nodal ablation in 2019 with MDT CRT-D.  6. Remote LV thrombus 7. Chronic systolic CHF: Nonischemic cardiomyopathy. Medtronic CRT-D device.  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  - LHC 2004: Nonobstructive mild CAD.  - Echo (8/19): EF 10-15%, mildly dilated LV, mild-moderately dilated RV with mild-moderately decreased systolic function.  - Echo (7/21): EF 20-25%, low normal RV function, moderate MR, mild AS.  - Echo (10/22): EF 25-30%, severe LV dilation, RV normal, moderate TR, mod-severe AS with mean gradient 9 mmHg but AVA 0.76 cm^2 (?low gradient severe AS), mild-moderate MR, left=>right shunt possible ASD.  - Echo (10/23): EF 25-30%, severe LV dilation, normal RV, mild MR, moderate AS with AVA 1.1 cm^2, mean gradient 9 mmHg (low flow low gradient moderate AS). ASD noted.  - RHC/LHC (10/22): mild nonobstructive CAD, left to right atrial level shunt with Qp/Qs 1.43.  8. H/o cholecystectomy 9. Hypothyroidism 10. 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 Aortic stenosis: TEE (10/22) showed EF 30%, moderate LV dilation, normal RV, possible calcified vegetation on the ICD lead, small secundum ASD, peak RV-RA gradient 23 mmHg, low flow/low gradient moderate AS, mild-moderate MR.  - Echo in 10/23 confirmed moderate low flow/low gradient AS.  13. 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  9. 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  - RHC in 10/22 showed atrial level shunt with Qp/Qs 1.43.  15. PVCs: Zio monitor (10/22) with 12.6% PVCs. - Intolerant of both amiodarone and mexiletine.   SH: Married, lives in [place].  No smoking or ETOH.   Family History  Problem Relation Age of Onset   Hip fracture Mother    COPD Father    CAD Sister    Heart attack Sister    ROS: All systems reviewed and negative except as per HPI.   Current Outpatient Medications  Medication Sig Dispense Refill   acetaminophen (TYLENOL) 500 MG tablet Take 1,000 mg by mouth every 6 (six) hours as needed for mild pain.     allopurinol (ZYLOPRIM) 100 MG tablet Take 100-200 mg by mouth See admin instructions. Take  200 mg in the morning 100 mg at bedtime     Cinnamon 500 MG capsule Take 500 mg by mouth daily.     empagliflozin (JARDIANCE) 10 MG TABS tablet Take 1 tablet (10 mg total) by mouth daily before breakfast. 30 tablet 11   Glucosamine-Chondroitin (OSTEO BI-FLEX REGULAR STRENGTH PO) Take 1 tablet by mouth daily.     levothyroxine (SYNTHROID) 88 MCG tablet TAKE 1 TABLET BY MOUTH DAILY BEFORE BREAKFAST. 30 tablet 11   loratadine (CLARITIN) 10 MG tablet Take 10 mg by mouth daily.     metoprolol succinate (TOPROL-XL) 50 MG 24 hr tablet Take 50 mg by mouth daily.     nitroGLYCERIN (NITROSTAT) 0.4 MG SL tablet Place 1 tablet (0.4 mg total) under the tongue every 5 (five) minutes as needed for chest pain. 90 tablet 3   Omega-3 Fatty Acids (FISH OIL) 1200 MG CPDR Take 1,200 mg by mouth daily.     omeprazole (PRILOSEC) 40 MG capsule TAKE 1 CAPSULE BY MOUTH ONCE DAILY. 90 capsule 1   sacubitril-valsartan (  ENTRESTO) 24-26 MG Take 1 tablet by mouth 2 (two) times daily. 60 tablet 6   simvastatin (ZOCOR) 20 MG tablet TAKE 1 TABLET EVERY DAY AT 6 PM 30 tablet 5   spironolactone (ALDACTONE) 25 MG tablet Take 1 tablet (25 mg total) by mouth daily. 90 tablet 3   TOUJEO SOLOSTAR 300 UNIT/ML Solostar Pen INJECT 24 UNITS INTO THE SKIN EVERY DAY 4.5 mL 5   warfarin (COUMADIN) 2 MG tablet TAKE 1/2 TO 1 TABLET ONCE A DAY AS DIRECTED BY ANTICOAGULATION. 45 tablet 5   furosemide (LASIX) 80 MG tablet Take 1 tablet (80 mg total) by mouth every morning AND 0.5 tablets (40 mg total) every evening. 135 tablet 3   No current facility-administered medications for this encounter.   Pulse 74   Wt 59.5 kg (131 lb 3.2 oz)   SpO2 100%   BMI 23.24 kg/m  Wt Readings from Last 3 Encounters:  04/25/22 59.5 kg (131 lb 3.2 oz)  01/27/22 58.2 kg (128 lb 6.4 oz)  01/14/22 60.4 kg (133 lb 3.2 oz)   PHYSICAL EXAM: General: NAD Neck: JVP 8 cm, no thyromegaly or thyroid nodule.  Lungs: Clear to auscultation bilaterally with normal  respiratory effort. CV: Nondisplaced PMI.  Heart irregular S1/S2, no S3/S4, no murmur.  Trace ankle edema.  No carotid bruit.  Normal pedal pulses.  Abdomen: Soft, nontender, no hepatosplenomegaly, no distention.  Skin: Intact without lesions or rashes.  Neurologic: Alert and oriented x 3.  Psych: Normal affect. Extremities: No clubbing or cyanosis.  HEENT: Normal.   Assessment/Plan: 1. Chronic systolic CHF: Nonischemic cardiomyopathy.  Low EF for years.  Per patient's report, she had an endomyocardial biopsy at [facility] in 1988 showing myocarditis.  Medication titration has been limited by CKD stage IV. Has MDT CRT-D device, s/p AV nodal ablation with chronic AF. Echo in 7/21 showed EF 20-25%, moderate MR, mild AS.  Echo 12/17/20 showed EF 25-30%, severe LV dilation, RV normal, moderate TR, mod-severe AS with mean gradient 9 mmHg but AVA 0.76 cm^2.  TEE in 10/22 with EF 30%.  LHC in 10/22 with mild nonobstructive CAD.  Echo in 10/23 showed EF 25-30%, severe LV dilation, normal RV, mild MR, moderate AS with AVA 1.1 cm^2, mean gradient 9 mmHg (low flow low gradient moderate AS), small ASD. She is only BiV pacing 75% of the time now, ?due to increased PVCs.  Recently decreased thoracic impedance, still low on device interrogation today. However, REDS clip only 22%.  On exam, she does not seem makedly overloaded but has NYHA class III symptoms.  She did pass out recently, some concern this could have been orthostatic.  - I will keep Lasix at 80 qam/40 qpm.  BMET/BNP today.  - To sort out volume status with conflicting information, I will arrange for RHC to assess filling pressures.  We discussed risks/benefits and she agrees to procedure.  - Continue spironolactone 25 mg daily.  - Continue Jardiance 10 mg daily.  - Continue Entresto 24/26 bid. Would not increase with elevated creatinine.  - Continue Toprol XL 50 mg daily, cannot tolerate higher dose due to nightmares   - I will have her followup with EP  soon given decreased BiV pacing percentage (?PVCs).  2.  Atrial fibrillation: Permanent.  S/p AV nodal ablation with BiV pacing.   - Continue warfarin.  INRs followed by [facility].  4. PVCs: Zio 10/22 with 12.6% PVCs. BiV pacing percentage has been up to >95% but now appears  to be around 75%, ?increasing PVCs again.  - Continue Toprol XL 50 mg daily.  - Unable to tolerate amiodarone in the past.  - She had a rash with mexiletine and stopped it.  - I will arrange for 7 day Zio monitor to assess PVCs.  5. Aortic stenosis: TEE in 10/22 showed low flow/low gradient moderate aortic stenosis.  Discussed with Dr. Burt Knack, at that point was not felt to need TAVR at the time. Echo in 10/23 with moderate low flow/low gradient AS.  6. ASD: Patient has an ASD by echo with L=>R shunting, small.  Qp/Qs by RHC was 1.43. Small on last echo.  - Reassess Qp/Qs on RHC.  7. Gout: Stable.  8. Calcified device lead vegetation: Blood cultures negative, PET-CT in 11/22 not suggestive of active endocarditis.   Followup 3 wks with APP.    Loralie Champagne,  04/25/2022

## 2022-04-25 NOTE — Patient Instructions (Addendum)
Description   Cath 2/29- Dr. Aundra Dubin would like INR close to 2.  Called and spoke with pt. Instructed pt to continue taking Warfarin  1 tablet daily except 1/2 tablet on Wednesdays  Recheck INR on Monday, 05/02/22 post Heart Cath  -Call [phone] if started on any new medications.

## 2022-04-25 NOTE — Progress Notes (Signed)
PCP: Ezequiel Essex, MD Cardiology: Dr. Domenic Polite HF Cardiology: Dr. Aundra Dubin EP: Dr. Lovena Le   72 y.o. with history of CKD stage IV, permanent atrial fibrillation, and a long-standing nonischemic cardiomyopathy was referred by Dr. Domenic Polite for CHF clinic evaluation.  She first developed cardiac problems in 1988.  At that time, she was admitted with CHF and eventually underwent myocardial biopsy at [facility], per her report showing myocarditis.  Patient states that her EF has been low since then.  She had MDT CRT-D device implanted.  Echo in 7/21 showed EF 20-25%, moderate MR, mild AS. She is now in permanent atrial fibrillation and is s/p AV nodal ablation with BiV pacing. Elevated creatinine has limited medication management.    Echo in 10/22 showed EF 25-30%, severe LV dilation, RV normal, moderate TR, mod-severe AS with mean gradient 9 mmHg but AVA 0.76 cm^2 (?low gradient severe AS), mild-moderate MR, left=>right shunt possible ASD.  TEE done in 10/22 to more closely assess AS.  This showed EF 30%, moderate LV dilation, normal RV, possible calcified vegetation on the ICD lead, small secundum ASD, peak RV-RA gradient 23 mmHg, low flow/low gradient moderate AS, mild-moderate MR. RHC/LHC was done, showing mild nonobstructive CAD, left to right atrial level shunt with Qp/Qs 1.43.  I reviewed TEE and cath report with Dr. Burt Knack.  We decided that AS was not severe enough yet for TAVR and that the ASD was not significant enough to close percutaneously. With possible calcified vegetation on ICD, we drew blood cultures which were negative.  She saw ID and had a PET-CT, which did not suggest active endocarditis.   Patient was found on Zio patch in 10/22 to have 12.6% PVCs, this was thought to be limiting her BiV pacing.  She has a history of intolerance of amiodarone so was started on mexiletine.  She says that this caused a rash so she stopped it and rash resolved. She also decreased Toprol XL to 100 mg daily due to  nightmares when taking 150 mg daily.  She subsequently has decreased Toprol XL to 50 mg daily.   Echo in 10/23 showed EF 25-30%, severe LV dilation, normal RV, mild MR, moderate AS with AVA 1.1 cm^2, mean gradient 9 mmHg (low flow low gradient moderate AS).   She presents today for followup of CHF.  She increased Lasix recently for decreased thoracic impedance noted on her Medtronic device. She reports an episode of lightheadedness last Thursday and then loss of consciousness Friday, both episodes occurring after standing up.  She did not have an ICD shock but she was noted to have NSVT both Thursday and Friday.  She thinks she passed out due to dehydration from too much Lasix.  She decreased her Lasix dose from 80 mg bid to 80 qam/40 qpm. Her BiV pacing percentage has dropped significantly (around 75%).  She has not felt palpitations.  She has been short of breath with moderate activity such as housework.  Ok walking on flat ground. +Cough.  No orthopnea/PND. She has had a chronic cough.   ECG (personally reviewed): atrial fibrillation with BiV pacing  REDS clip 22%  Labs (1/21): LDL 118 Labs (9/21): K 4.4, creatinine 1.93 Labs (12/21): K 4, creatinine 1.77 Labs (2/22): TSH normal Labs (5/22): K 5, creatinine 2.42 Labs (9/22): K 4.3, creatinine 1.73, LDL 90 Labs (10/22): K 4.2, creatinine 2.18 Labs (2/23): K 3.8, creatinine 2.42 Labs (11/23): K 3.6, creatinine 2.22  Medtronic device interrogation: Around 75% BiV pacing, decreased thoracic impedance, several  episodes of NSVT, no sustained VT/VF or shock.   PMH: 1. Gout 2. Complete heart block: Medtronic CRT device.  3. CKD stage IV: Diabetic nephropathy.  4. Type 2 diabetes 5. Atrial fibrillation: Now permanent.   - AF ablation 4/18.  - AV nodal ablation in 2019 with MDT CRT-D.  6. Remote LV thrombus 7. Chronic systolic CHF: Nonischemic cardiomyopathy. Medtronic CRT-D device.  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  - LHC 2004: Nonobstructive mild CAD.  - Echo (8/19): EF 10-15%, mildly dilated LV, mild-moderately dilated RV with mild-moderately decreased systolic function.  - Echo (7/21): EF 20-25%, low normal RV function, moderate MR, mild AS.  - Echo (10/22): EF 25-30%, severe LV dilation, RV normal, moderate TR, mod-severe AS with mean gradient 9 mmHg but AVA 0.76 cm^2 (?low gradient severe AS), mild-moderate MR, left=>right shunt possible ASD.  - Echo (10/23): EF 25-30%, severe LV dilation, normal RV, mild MR, moderate AS with AVA 1.1 cm^2, mean gradient 9 mmHg (low flow low gradient moderate AS). ASD noted.  - RHC/LHC (10/22): mild nonobstructive CAD, left to right atrial level shunt with Qp/Qs 1.43.  8. H/o cholecystectomy 9. Hypothyroidism 10. 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 Aortic stenosis: TEE (10/22) showed EF 30%, moderate LV dilation, normal RV, possible calcified vegetation on the ICD lead, small secundum ASD, peak RV-RA gradient 23 mmHg, low flow/low gradient moderate AS, mild-moderate MR.  - Echo in 10/23 confirmed moderate low flow/low gradient AS.  13. 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  76. 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  - RHC in 10/22 showed atrial level shunt with Qp/Qs 1.43.  15. PVCs: Zio monitor (10/22) with 12.6% PVCs. - Intolerant of both amiodarone and mexiletine.   SH: Married, lives in [place].  No smoking or ETOH.   Family History  Problem Relation Age of Onset   Hip fracture Mother    COPD Father    CAD Sister    Heart attack Sister    ROS: All systems reviewed and negative except as per HPI.   Current Outpatient Medications  Medication Sig Dispense Refill   acetaminophen (TYLENOL) 500 MG tablet Take 1,000 mg by mouth every 6 (six) hours as needed for mild pain.     allopurinol (ZYLOPRIM) 100 MG tablet Take 100-200 mg by mouth See admin instructions. Take  200 mg in the morning 100 mg at bedtime     Cinnamon 500 MG capsule Take 500 mg by mouth daily.     empagliflozin (JARDIANCE) 10 MG TABS tablet Take 1 tablet (10 mg total) by mouth daily before breakfast. 30 tablet 11   Glucosamine-Chondroitin (OSTEO BI-FLEX REGULAR STRENGTH PO) Take 1 tablet by mouth daily.     levothyroxine (SYNTHROID) 88 MCG tablet TAKE 1 TABLET BY MOUTH DAILY BEFORE BREAKFAST. 30 tablet 11   loratadine (CLARITIN) 10 MG tablet Take 10 mg by mouth daily.     metoprolol succinate (TOPROL-XL) 50 MG 24 hr tablet Take 50 mg by mouth daily.     nitroGLYCERIN (NITROSTAT) 0.4 MG SL tablet Place 1 tablet (0.4 mg total) under the tongue every 5 (five) minutes as needed for chest pain. 90 tablet 3   Omega-3 Fatty Acids (FISH OIL) 1200 MG CPDR Take 1,200 mg by mouth daily.     omeprazole (PRILOSEC) 40 MG capsule TAKE 1 CAPSULE BY MOUTH ONCE DAILY. 90 capsule 1   sacubitril-valsartan (  ENTRESTO) 24-26 MG Take 1 tablet by mouth 2 (two) times daily. 60 tablet 6   simvastatin (ZOCOR) 20 MG tablet TAKE 1 TABLET EVERY DAY AT 6 PM 30 tablet 5   spironolactone (ALDACTONE) 25 MG tablet Take 1 tablet (25 mg total) by mouth daily. 90 tablet 3   TOUJEO SOLOSTAR 300 UNIT/ML Solostar Pen INJECT 24 UNITS INTO THE SKIN EVERY DAY 4.5 mL 5   warfarin (COUMADIN) 2 MG tablet TAKE 1/2 TO 1 TABLET ONCE A DAY AS DIRECTED BY ANTICOAGULATION. 45 tablet 5   furosemide (LASIX) 80 MG tablet Take 1 tablet (80 mg total) by mouth every morning AND 0.5 tablets (40 mg total) every evening. 135 tablet 3   No current facility-administered medications for this encounter.   Pulse 74   Wt 59.5 kg (131 lb 3.2 oz)   SpO2 100%   BMI 23.24 kg/m  Wt Readings from Last 3 Encounters:  04/25/22 59.5 kg (131 lb 3.2 oz)  01/27/22 58.2 kg (128 lb 6.4 oz)  01/14/22 60.4 kg (133 lb 3.2 oz)   PHYSICAL EXAM: General: NAD Neck: JVP 8 cm, no thyromegaly or thyroid nodule.  Lungs: Clear to auscultation bilaterally with normal  respiratory effort. CV: Nondisplaced PMI.  Heart irregular S1/S2, no S3/S4, no murmur.  Trace ankle edema.  No carotid bruit.  Normal pedal pulses.  Abdomen: Soft, nontender, no hepatosplenomegaly, no distention.  Skin: Intact without lesions or rashes.  Neurologic: Alert and oriented x 3.  Psych: Normal affect. Extremities: No clubbing or cyanosis.  HEENT: Normal.   Assessment/Plan: 1. Chronic systolic CHF: Nonischemic cardiomyopathy.  Low EF for years.  Per patient's report, she had an endomyocardial biopsy at [facility] in 1988 showing myocarditis.  Medication titration has been limited by CKD stage IV. Has MDT CRT-D device, s/p AV nodal ablation with chronic AF. Echo in 7/21 showed EF 20-25%, moderate MR, mild AS.  Echo 12/17/20 showed EF 25-30%, severe LV dilation, RV normal, moderate TR, mod-severe AS with mean gradient 9 mmHg but AVA 0.76 cm^2.  TEE in 10/22 with EF 30%.  LHC in 10/22 with mild nonobstructive CAD.  Echo in 10/23 showed EF 25-30%, severe LV dilation, normal RV, mild MR, moderate AS with AVA 1.1 cm^2, mean gradient 9 mmHg (low flow low gradient moderate AS), small ASD. She is only BiV pacing 75% of the time now, ?due to increased PVCs.  Recently decreased thoracic impedance, still low on device interrogation today. However, REDS clip only 22%.  On exam, she does not seem makedly overloaded but has NYHA class III symptoms.  She did pass out recently, some concern this could have been orthostatic.  - I will keep Lasix at 80 qam/40 qpm.  BMET/BNP today.  - To sort out volume status with conflicting information, I will arrange for RHC to assess filling pressures.  We discussed risks/benefits and she agrees to procedure.  - Continue spironolactone 25 mg daily.  - Continue Jardiance 10 mg daily.  - Continue Entresto 24/26 bid. Would not increase with elevated creatinine.  - Continue Toprol XL 50 mg daily, cannot tolerate higher dose due to nightmares   2.  Atrial fibrillation: Permanent.   S/p AV nodal ablation with BiV pacing.   - Continue warfarin.  INRs followed by [facility].  4. PVCs: Zio 10/22 with 12.6% PVCs. BiV pacing percentage has been up to >95% but now appears to be around 75%, ?increasing PVCs again.  - Continue Toprol XL 50 mg daily.  -  Unable to tolerate amiodarone in the past.  - She had a rash with mexiletine and stopped it.  - I will arrange for 7 day Zio monitor to assess PVCs.  5. Aortic stenosis: TEE in 10/22 showed low flow/low gradient moderate aortic stenosis.  Discussed with Dr. Burt Knack, at that point was not felt to need TAVR at the time. Echo in 10/23 with moderate low flow/low gradient AS.  6. ASD: Patient has an ASD by echo with L=>R shunting, small.  Qp/Qs by RHC was 1.43. Small on last echo.  - Reassess Qp/Qs on RHC.  7. Gout: Stable.  8. Calcified device lead vegetation: Blood cultures negative, PET-CT in 11/22 not suggestive of active endocarditis.   Followup 3 wks with APP.    Loralie Champagne,  04/25/2022

## 2022-04-25 NOTE — Progress Notes (Signed)
ReDS Vest / Clip - 04/25/22 1200       ReDS Vest / Clip   Station Marker A    Ruler Value 29    ReDS Value Range Low volume    ReDS Actual Value 22    Anatomical Comments sitting

## 2022-04-25 NOTE — Progress Notes (Signed)
  Staff message:  Kerry Dory, CMA  P Cv Div Ch St Anticoag Hello  This patient is scheduled for a RHC with Dr Aundra Dubin on Thursday 2/29 @ 730, an INR was drawn today. Dr Aundra Dubin would like keep INR dose to 2 with warfarin.    I told the patient the coumadin clinic will definitely be in touch with details regarding her INR.   Thanks so much

## 2022-04-25 NOTE — Patient Instructions (Signed)
CONTINUE Lasix 80 mg in the AM and 40 mg in the PM  Labs today We will only contact you if something comes back abnormal or we need to make some changes. Otherwise no news is good news!  Your provider has recommended that  you wear a Zio Patch for 7 days.  This monitor will record your heart rhythm for our review.  IF you have any symptoms while wearing the monitor please press the button.  If you have any issues with the patch or you notice a red or orange light on it please call the company at [phone].  Once you remove the patch please mail it back to the company as soon as possible so we can get the results.  You have been referred to [facility] -they will be in contact with an appointment  Your physician recommends that you schedule a follow-up appointment in: 3-4 weeks  in the Advanced Practitioners (PA/NP) Clinic       You are scheduled for a Cardiac Catheterization on Thursday, February 29 with Dr. Loralie Champagne.  1. Please arrive at the [place] (Main Entrance A) at [facility]: [address] [place], [place] [address] at 5:30 AM (This time is two hours before your procedure to ensure your preparation). Free valet parking service is available.   Special note: Every effort is made to have your procedure done on time. Please understand that emergencies sometimes delay scheduled procedures.  2. Diet: Do not eat solid foods after midnight.  The patient may have clear liquids until 5am upon the day of the procedure.  3. Labs: pre procedure labs done -the coumadin clinic will be in contact regarding your inr  4. Medication instructions in preparation for your procedure:   Contrast Allergy: No    The Coumadin clinic will be in contact regarding your INR  Stop taking, Lasix (Furosemide)  Thursday, February 29,  Take only 12 units of insulin the night before your procedure. Do not take any insulin on the day of the procedure.    On the morning  of your procedure, take your Aspirin 81 mg and any morning medicines NOT listed above.  You may use sips of water.  5. Plan for one night stay--bring personal belongings. 6. Bring a current list of your medications and current insurance cards. 7. You MUST have a responsible person to drive you home. 8. Someone MUST be with you the first 24 hours after you arrive home or your discharge will be delayed. 9. Please wear clothes that are easy to get on and off and wear slip-on shoes.  Thank you for allowing [place] to care for you!   -- [facility]

## 2022-04-28 ENCOUNTER — Encounter (HOSPITAL_COMMUNITY): Payer: Self-pay | Admitting: Cardiology

## 2022-04-28 ENCOUNTER — Encounter: Payer: Self-pay | Admitting: Radiology

## 2022-04-28 ENCOUNTER — Encounter (HOSPITAL_COMMUNITY)
Admission: RE | Disposition: A | Discharge status: Home / Self Care | Payer: Self-pay | Source: Home / Self Care | Attending: Cardiology

## 2022-04-28 ENCOUNTER — Ambulatory Visit (HOSPITAL_BASED_OUTPATIENT_CLINIC_OR_DEPARTMENT_OTHER): Payer: PPO

## 2022-04-28 ENCOUNTER — Ambulatory Visit (HOSPITAL_COMMUNITY)
Admission: RE | Admit: 2022-04-28 | Discharge: 2022-04-28 | Disposition: A | Discharge status: Home / Self Care | Payer: PPO | Attending: Cardiology | Admitting: Cardiology

## 2022-04-28 DIAGNOSIS — I5023 Acute on chronic systolic (congestive) heart failure: Secondary | ICD-10-CM | POA: Diagnosis not present

## 2022-04-28 DIAGNOSIS — I130 Hypertensive heart and chronic kidney disease with heart failure and stage 1 through stage 4 chronic kidney disease, or unspecified chronic kidney disease: Secondary | ICD-10-CM | POA: Insufficient documentation

## 2022-04-28 DIAGNOSIS — I5022 Chronic systolic (congestive) heart failure: Secondary | ICD-10-CM | POA: Diagnosis present

## 2022-04-28 DIAGNOSIS — Z79899 Other long term (current) drug therapy: Secondary | ICD-10-CM | POA: Insufficient documentation

## 2022-04-28 DIAGNOSIS — R002 Palpitations: Secondary | ICD-10-CM

## 2022-04-28 DIAGNOSIS — I4819 Other persistent atrial fibrillation: Secondary | ICD-10-CM

## 2022-04-28 DIAGNOSIS — I340 Nonrheumatic mitral (valve) insufficiency: Secondary | ICD-10-CM | POA: Insufficient documentation

## 2022-04-28 DIAGNOSIS — Q211 Atrial septal defect, unspecified: Secondary | ICD-10-CM

## 2022-04-28 DIAGNOSIS — E1122 Type 2 diabetes mellitus with diabetic chronic kidney disease: Secondary | ICD-10-CM | POA: Insufficient documentation

## 2022-04-28 DIAGNOSIS — Z7901 Long term (current) use of anticoagulants: Secondary | ICD-10-CM | POA: Diagnosis not present

## 2022-04-28 DIAGNOSIS — I428 Other cardiomyopathies: Secondary | ICD-10-CM | POA: Diagnosis not present

## 2022-04-28 DIAGNOSIS — I493 Ventricular premature depolarization: Secondary | ICD-10-CM | POA: Insufficient documentation

## 2022-04-28 DIAGNOSIS — M109 Gout, unspecified: Secondary | ICD-10-CM | POA: Diagnosis not present

## 2022-04-28 DIAGNOSIS — I4821 Permanent atrial fibrillation: Secondary | ICD-10-CM | POA: Insufficient documentation

## 2022-04-28 DIAGNOSIS — N184 Chronic kidney disease, stage 4 (severe): Secondary | ICD-10-CM | POA: Diagnosis not present

## 2022-04-28 DIAGNOSIS — I272 Pulmonary hypertension, unspecified: Secondary | ICD-10-CM | POA: Insufficient documentation

## 2022-04-28 HISTORY — PX: RIGHT HEART CATH: CATH118263

## 2022-04-28 LAB — CBC
HCT: 33.7 % — ABNORMAL LOW (ref 36.0–46.0)
Hemoglobin: 11.2 g/dL — ABNORMAL LOW (ref 12.0–15.0)
MCH: 30.8 pg (ref 26.0–34.0)
MCHC: 33.2 g/dL (ref 30.0–36.0)
MCV: 92.6 fL (ref 80.0–100.0)
Platelets: 316 10*3/uL (ref 150–400)
RBC: 3.64 MIL/uL — ABNORMAL LOW (ref 3.87–5.11)
RDW: 14.4 % (ref 11.5–15.5)
WBC: 5 10*3/uL (ref 4.0–10.5)
nRBC: 0 % (ref 0.0–0.2)

## 2022-04-28 LAB — POCT I-STAT EG7
Acid-base deficit: 4 mmol/L — ABNORMAL HIGH (ref 0.0–2.0)
Acid-base deficit: 5 mmol/L — ABNORMAL HIGH (ref 0.0–2.0)
Bicarbonate: 19.8 mmol/L — ABNORMAL LOW (ref 20.0–28.0)
Bicarbonate: 20.5 mmol/L (ref 20.0–28.0)
Calcium, Ion: 1.06 mmol/L — ABNORMAL LOW (ref 1.15–1.40)
Calcium, Ion: 1.07 mmol/L — ABNORMAL LOW (ref 1.15–1.40)
HCT: 32 % — ABNORMAL LOW (ref 36.0–46.0)
HCT: 32 % — ABNORMAL LOW (ref 36.0–46.0)
Hemoglobin: 10.9 g/dL — ABNORMAL LOW (ref 12.0–15.0)
Hemoglobin: 10.9 g/dL — ABNORMAL LOW (ref 12.0–15.0)
O2 Saturation: 62 %
O2 Saturation: 70 %
Potassium: 4 mmol/L (ref 3.5–5.1)
Potassium: 4 mmol/L (ref 3.5–5.1)
Sodium: 131 mmol/L — ABNORMAL LOW (ref 135–145)
Sodium: 131 mmol/L — ABNORMAL LOW (ref 135–145)
TCO2: 21 mmol/L — ABNORMAL LOW (ref 22–32)
TCO2: 22 mmol/L (ref 22–32)
pCO2, Ven: 33.8 mmHg — ABNORMAL LOW (ref 44–60)
pCO2, Ven: 36 mmHg — ABNORMAL LOW (ref 44–60)
pH, Ven: 7.363 (ref 7.25–7.43)
pH, Ven: 7.375 (ref 7.25–7.43)
pO2, Ven: 33 mmHg (ref 32–45)
pO2, Ven: 37 mmHg (ref 32–45)

## 2022-04-28 LAB — ECHOCARDIOGRAM COMPLETE
AR max vel: 0.85 cm2
AV Area VTI: 0.85 cm2
AV Area mean vel: 0.86 cm2
AV Mean grad: 13.5 mmHg
AV Peak grad: 21.3 mmHg
Ao pk vel: 2.31 m/s
Area-P 1/2: 2.69 cm2
Calc EF: 37 %
Height: 62 in
MV M vel: 4.97 m/s
MV Peak grad: 98.8 mmHg
MV VTI: 1.27 cm2
Radius: 0.4 cm
S' Lateral: 5.2 cm
Single Plane A2C EF: 39.7 %
Single Plane A4C EF: 27.8 %
Weight: 2080 oz

## 2022-04-28 LAB — PROTIME-INR
INR: 2.1 — ABNORMAL HIGH (ref 0.8–1.2)
Prothrombin Time: 23.6 seconds — ABNORMAL HIGH (ref 11.4–15.2)

## 2022-04-28 LAB — GLUCOSE, CAPILLARY
Glucose-Capillary: 168 mg/dL — ABNORMAL HIGH (ref 70–99)
Glucose-Capillary: 212 mg/dL — ABNORMAL HIGH (ref 70–99)

## 2022-04-28 SURGERY — RIGHT HEART CATH
Anesthesia: LOCAL

## 2022-04-28 MED ORDER — HEPARIN (PORCINE) IN NACL 1000-0.9 UT/500ML-% IV SOLN
INTRAVENOUS | Status: DC | PRN
  Administered 2022-04-28 (×2): 500 mL

## 2022-04-28 MED ORDER — LIDOCAINE HCL (PF) 1 % IJ SOLN
INTRAMUSCULAR | Status: DC | PRN
  Administered 2022-04-28 (×2): 2 mL

## 2022-04-28 MED ORDER — LIDOCAINE HCL (PF) 1 % IJ SOLN
INTRAMUSCULAR | Status: AC
  Filled 2022-04-28: qty 30

## 2022-04-28 MED ORDER — METHYLPREDNISOLONE SODIUM SUCC 125 MG IJ SOLR
125.0000 mg | Freq: Once | INTRAMUSCULAR | Status: AC
  Administered 2022-04-28: 125 mg via INTRAVENOUS
  Filled 2022-04-28: qty 2

## 2022-04-28 MED ORDER — SODIUM CHLORIDE 0.9% FLUSH: 3.0000 mL | INTRAVENOUS | Status: DC | PRN

## 2022-04-28 MED ORDER — DIPHENHYDRAMINE HCL 50 MG/ML IJ SOLN
25.0000 mg | Freq: Once | INTRAMUSCULAR | Status: AC
  Administered 2022-04-28: 25 mg via INTRAVENOUS
  Filled 2022-04-28: qty 1

## 2022-04-28 MED ORDER — ONDANSETRON HCL 4 MG/2ML IJ SOLN: 4.0000 mg | Freq: Four times a day (QID) | INTRAMUSCULAR | Status: DC | PRN

## 2022-04-28 MED ORDER — SODIUM CHLORIDE 0.9 % IV SOLN: INTRAVENOUS | Status: DC

## 2022-04-28 MED ORDER — SODIUM CHLORIDE 0.9% FLUSH: 3.0000 mL | Freq: Two times a day (BID) | INTRAVENOUS | Status: DC

## 2022-04-28 MED ORDER — SODIUM CHLORIDE 0.9 % IV SOLN: 250.0000 mL | INTRAVENOUS | Status: DC | PRN

## 2022-04-28 MED ORDER — HYDRALAZINE HCL 20 MG/ML IJ SOLN: 10.0000 mg | INTRAMUSCULAR | Status: DC | PRN

## 2022-04-28 MED ORDER — LABETALOL HCL 5 MG/ML IV SOLN: 10.0000 mg | INTRAVENOUS | Status: DC | PRN

## 2022-04-28 MED ORDER — ACETAMINOPHEN 325 MG PO TABS: 650.0000 mg | ORAL_TABLET | ORAL | Status: DC | PRN

## 2022-04-28 SURGICAL SUPPLY — 11 items
CATH BALLN WEDGE 5F 110CM (CATHETERS) IMPLANT
CATH SWAN GANZ 7F STRAIGHT (CATHETERS) IMPLANT
PACK CARDIAC CATHETERIZATION (CUSTOM PROCEDURE TRAY) ×1 IMPLANT
SHEATH GLIDE SLENDER 4/5FR (SHEATH) IMPLANT
SHEATH PINNACLE 7F 10CM (SHEATH) IMPLANT
SHEATH PROBE COVER 6X72 (BAG) IMPLANT
TRANSDUCER W/STOPCOCK (MISCELLANEOUS) ×1 IMPLANT
TUBING ART PRESS 72  MALE/FEM (TUBING) ×2
TUBING ART PRESS 72 MALE/FEM (TUBING) IMPLANT
WIRE EMERALD 3MM-J .025X260CM (WIRE) IMPLANT
WIRE MICROINTRODUCER 60CM (WIRE) IMPLANT

## 2022-04-28 NOTE — Discharge Instructions (Signed)
Increase Lasix (furosemide) to 80 mg twice a day alternating with 80 mg in the morning and 40 mg in the afternoon.

## 2022-04-28 NOTE — Interval H&P Note (Signed)
History and Physical Interval Note:  04/28/2022 7:29 AM  Emily [name]  has presented today for surgery, with the diagnosis of heart failure.  The various methods of treatment have been discussed with the patient and family. After consideration of risks, benefits and other options for treatment, the patient has consented to  Procedure(s): RIGHT HEART CATH (N/A) as a surgical intervention.  The patient's history has been reviewed, patient examined, no change in status, stable for surgery.  I have reviewed the patient's chart and labs.  Questions were answered to the patient's satisfaction.     Emily [name] [employer]

## 2022-04-28 NOTE — Addendum Note (Signed)
Encounter addended by: Larey Dresser, MD on: 04/28/2022 7:29 AM  Actions taken: Clinical Note Signed

## 2022-04-28 NOTE — Progress Notes (Signed)
Echocardiogram 2D Echocardiogram has been performed.  Oneal Deputy Leonardo Plaia RDCS 04/28/2022, 11:04 AM

## 2022-04-29 LAB — POCT I-STAT EG7
Acid-base deficit: 5 mmol/L — ABNORMAL HIGH (ref 0.0–2.0)
Acid-base deficit: 5 mmol/L — ABNORMAL HIGH (ref 0.0–2.0)
Acid-base deficit: 5 mmol/L — ABNORMAL HIGH (ref 0.0–2.0)
Bicarbonate: 19.4 mmol/L — ABNORMAL LOW (ref 20.0–28.0)
Bicarbonate: 19.3 mmol/L — ABNORMAL LOW (ref 20.0–28.0)
Bicarbonate: 20 mmol/L (ref 20.0–28.0)
Calcium, Ion: 1.05 mmol/L — ABNORMAL LOW (ref 1.15–1.40)
Calcium, Ion: 1.07 mmol/L — ABNORMAL LOW (ref 1.15–1.40)
Calcium, Ion: 1.07 mmol/L — ABNORMAL LOW (ref 1.15–1.40)
HCT: 31 % — ABNORMAL LOW (ref 36.0–46.0)
HCT: 32 % — ABNORMAL LOW (ref 36.0–46.0)
HCT: 33 % — ABNORMAL LOW (ref 36.0–46.0)
Hemoglobin: 10.5 g/dL — ABNORMAL LOW (ref 12.0–15.0)
Hemoglobin: 10.9 g/dL — ABNORMAL LOW (ref 12.0–15.0)
Hemoglobin: 11.2 g/dL — ABNORMAL LOW (ref 12.0–15.0)
O2 Saturation: 68 %
O2 Saturation: 67 %
O2 Saturation: 70 %
Potassium: 3.9 mmol/L (ref 3.5–5.1)
Potassium: 4 mmol/L (ref 3.5–5.1)
Potassium: 4 mmol/L (ref 3.5–5.1)
Sodium: 132 mmol/L — ABNORMAL LOW (ref 135–145)
Sodium: 132 mmol/L — ABNORMAL LOW (ref 135–145)
Sodium: 132 mmol/L — ABNORMAL LOW (ref 135–145)
TCO2: 20 mmol/L — ABNORMAL LOW (ref 22–32)
TCO2: 20 mmol/L — ABNORMAL LOW (ref 22–32)
TCO2: 21 mmol/L — ABNORMAL LOW (ref 22–32)
pCO2, Ven: 33.8 mmHg — ABNORMAL LOW (ref 44–60)
pCO2, Ven: 33 mmHg — ABNORMAL LOW (ref 44–60)
pCO2, Ven: 33.9 mmHg — ABNORMAL LOW (ref 44–60)
pH, Ven: 7.367 (ref 7.25–7.43)
pH, Ven: 7.376 (ref 7.25–7.43)
pH, Ven: 7.378 (ref 7.25–7.43)
pO2, Ven: 36 mmHg (ref 32–45)
pO2, Ven: 35 mmHg (ref 32–45)
pO2, Ven: 37 mmHg (ref 32–45)

## 2022-05-01 MED FILL — Lidocaine HCl Local Preservative Free (PF) Inj 1%: INTRAMUSCULAR | Qty: 30 | Status: AC

## 2022-05-02 ENCOUNTER — Telehealth (HOSPITAL_COMMUNITY): Payer: Self-pay

## 2022-05-02 ENCOUNTER — Ambulatory Visit: Payer: PPO | Attending: Internal Medicine

## 2022-05-02 DIAGNOSIS — I5022 Chronic systolic (congestive) heart failure: Secondary | ICD-10-CM

## 2022-05-02 DIAGNOSIS — Z95810 Presence of automatic (implantable) cardiac defibrillator: Secondary | ICD-10-CM

## 2022-05-02 NOTE — Telephone Encounter (Signed)
Patient aware of medication changes. She is complaining of some wheezing and coughing. She is going to call me after changing the medication and let me know if improved or not.  She is also going to reach out to PCP.

## 2022-05-02 NOTE — Progress Notes (Unsigned)
EPIC Encounter for ICM Monitoring  Patient Name: Emily [name] is a 72 y.o. female Date: 05/02/2022 Primary Care Physican: Ezequiel Essex, MD Primary Cardiologist: [facility] Electrophysiologist: Lovena Le Nephrologist: [facility]-Dr Clover Mealy  Bi-V Pacing: 81.1% (Since 25-Apr-2022) 09/09/2021 Weight: 133-134 lbs 11/17/2021 Weight:  132 lbs 03/29/2022 Weight: 131 lbs 04/04/2022 Weight:  128 lbs 04/19/2022 Weight: 128 lbs 05/04/2022 Weight: 123 lbs    VT-NS (>4 beats, >150 bpm)    4   Spoke with patient and heart failure questions reviewed.  Transmission results reviewed.  Pt reports heart cath showed she has a lot of fluid and Torsemide dosage increased.  She is coughing and wheezing but also sounds very nasally congested over the phone.  She said Dr Aundra Dubin told her the cough is from the heart fluid.           Optivol Thoracic impedance trending closer to baseline since taking  Furosemide 80 mg AM/40 mg PM dosage since 2/2.   Fluid index higher than normal threshold starting 12/31.   Prescribed: Furosemide 80 mg Take 1 tablet (80 mg total) by mouth twice a day alternating with 80 mg every morning and 0.5 tablet (40 mg total) every evening.   Spironolactone 25 mg take 0.5 tablet by mouth daily   Labs:   04/19/2022 Creatinine 2.12, BUN 32, Potassium 4.0, Sodium 136, GFR 24 01/24/2022 Creatinine 2.31, BUN 31, Potassium 5.0, Sodium 136, GFR 22 01/14/2022 Creatinine 2.20, BUN 29, Potassium 3.1, sodium 136, GFR 23 12/14/2021 Creatinine 2.11, BUN 33, Potassium 3.9, Sodium 131, GFR 24, Glucose 502 07/29/2021 Creatinine 1.80, BUN 25, Potassium 4.2, Sodium 140, GFR 30 07/15/2021 Creatinine 1.64, BUN 17, Potassium 3.3, Sodium 140  A complete set of results can be found in Results Review.   Recommendations:   She was instructed by HF clinic today to increase Torsemide dosage and will recheck fluid levels next week after taking new dosage.   Follow-up plan: ICM clinic phone  appointment on 05/10/2022 to recheck fluid levels.  91 day device clinic remote transmission 05/10/2022.      EP/Cardiology Office Visits:  05/10/2022 with Dr Lovena Le.  05/23/2022 with HF clinic.       Copy of ICM check sent to Dr. Lovena Le.  3 month ICM trend: 05/02/2022.    12-14 Month ICM trend:     [name], RN 05/02/2022 8:34 AM

## 2022-05-02 NOTE — Telephone Encounter (Signed)
-----   Message from Larey Dresser, MD sent at 04/28/2022  8:48 AM EST ----- Increase Lasix to 80 mg bid alternating with 80 qam/40 qpm.  Just had RHC today.  Also make sure she got echo done in short stay, if not needs as outpatient for MR/AS.

## 2022-05-02 NOTE — Telephone Encounter (Signed)
Left message for patient

## 2022-05-06 ENCOUNTER — Telehealth: Payer: Self-pay | Admitting: Family Medicine

## 2022-05-06 DIAGNOSIS — E1122 Type 2 diabetes mellitus with diabetic chronic kidney disease: Secondary | ICD-10-CM

## 2022-05-06 NOTE — Telephone Encounter (Signed)
Received fax from NP regarding video home visit through insurance-provided service.   Fax discusses that NP Emily [name] dx Emily [name] with cough and sinus infection via video visit on 3/06. Rx doxycycline 100 mg BID x 7 days and prednisone 40 mg qD x 5 days. Rx sliding scale insulin with Humalog (although exact sliding scale not mentioned in note). She also notes that patient is not taking her Toujeo as prescribed (24 units daily) because she cannot afford the co-pay of $80.   Last A1c 11.3 on 12/29/22 - the last time she has been seen by primary care.   Diabetes meds:  Toujeo 24 units daily Jardiance 10 mg  I have looked up her formulary and am not able to discern the price difference between [place], [place], and [place]. I will forward to our [place] for help with test claims.   Ezequiel Essex, MD

## 2022-05-09 ENCOUNTER — Encounter: Payer: Self-pay | Admitting: Family Medicine

## 2022-05-09 ENCOUNTER — Other Ambulatory Visit (HOSPITAL_COMMUNITY): Payer: Self-pay

## 2022-05-09 MED ORDER — "INSULIN SYRINGE 29G X 1/2"" 0.5 ML MISC": 6 refills | Status: DC

## 2022-05-09 MED ORDER — INSULIN GLARGINE 100 UNIT/ML SOLOSTAR PEN: 24.0000 [IU] | PEN_INJECTOR | SUBCUTANEOUS | 5 refills | Status: DC

## 2022-05-09 NOTE — Addendum Note (Signed)
Addended by: Renard Hamper on: 05/09/2022 02:17 PM   Modules accepted: Orders

## 2022-05-09 NOTE — Telephone Encounter (Signed)
Ordered Lantus basal insulin 24 units daily. Ezequiel Essex, MD

## 2022-05-10 ENCOUNTER — Ambulatory Visit (INDEPENDENT_AMBULATORY_CARE_PROVIDER_SITE_OTHER): Payer: PPO

## 2022-05-10 ENCOUNTER — Encounter: Payer: Self-pay | Admitting: Internal Medicine

## 2022-05-10 ENCOUNTER — Ambulatory Visit: Payer: PPO | Attending: Internal Medicine | Admitting: Internal Medicine

## 2022-05-10 VITALS — BP 108/46 | HR 67 | Ht 62.5 in | Wt 127.0 lb

## 2022-05-10 DIAGNOSIS — Z95810 Presence of automatic (implantable) cardiac defibrillator: Secondary | ICD-10-CM

## 2022-05-10 DIAGNOSIS — I420 Dilated cardiomyopathy: Secondary | ICD-10-CM

## 2022-05-10 DIAGNOSIS — I493 Ventricular premature depolarization: Secondary | ICD-10-CM

## 2022-05-10 DIAGNOSIS — I5022 Chronic systolic (congestive) heart failure: Secondary | ICD-10-CM

## 2022-05-10 LAB — CUP PACEART INCLINIC DEVICE CHECK
Date Time Interrogation Session: 20240312152249
Implantable Lead Connection Status: 753985
Implantable Lead Connection Status: 753985
Implantable Lead Connection Status: 753985
Implantable Lead Implant Date: 20050218
Implantable Lead Implant Date: 20050218
Implantable Lead Implant Date: 20050218
Implantable Lead Location: 753858
Implantable Lead Location: 753859
Implantable Lead Location: 753860
Implantable Lead Model: 4194
Implantable Lead Model: 5076
Implantable Lead Model: 6947
Implantable Pulse Generator Implant Date: 20230313

## 2022-05-10 NOTE — Progress Notes (Signed)
HPI [name] returns today for followup. She is a pleasant 72 yo woman with a h/o both atrial and ventricular arrhythmias. She has undergone AV node ablation. She has persistent atrial fib. She has a h/o dietary indiscretion and has undergone adjustments of her meds and dietary change.  No ICD therapies. She has undergone workup which demonstrates mod MR, pulm HTN, elevated filling pressures and increased PVC's. She is chronically in atrial fib.   Allergies  Allergen Reactions   Amiodarone Rash   Codeine Other (See Comments)    Euphoria, hallucinations   Other Palpitations    PT STATES SHE GETS REALLY HOT , DIZZY HEADED, HAS PASSED OUT TWICE , AND ITCHING WITH IV DYE   Turmeric     Pt went into AFIB    Colchicine Rash   Iodine Rash    topical   Ivp Dye [Iodinated Contrast Media] Rash   Mexitil [Mexiletine] Rash     Current Outpatient Medications  Medication Sig Dispense Refill   acetaminophen (TYLENOL) 500 MG tablet Take 500 mg by mouth every 6 (six) hours as needed for mild pain.     allopurinol (ZYLOPRIM) 100 MG tablet Take 100-200 mg by mouth See admin instructions. Take 2 tablets (200 mg) by mouth in the morning & take 1 tablet (100 mg) by mouth at bedtime     Cinnamon 500 MG capsule Take 500 mg by mouth daily.     empagliflozin (JARDIANCE) 10 MG TABS tablet Take 1 tablet (10 mg total) by mouth daily before breakfast. 30 tablet 11   furosemide (LASIX) 80 MG tablet Take 1 tablet (80 mg total) by mouth every morning AND 0.5 tablets (40 mg total) every evening. 135 tablet 3   Glucosamine-Chondroitin (OSTEO BI-FLEX REGULAR STRENGTH PO) Take 1 tablet by mouth in the morning.     insulin glargine (LANTUS) 100 UNIT/ML Solostar Pen Inject 24 Units into the skin every morning. Use this insulin in place of Tuojeo 15 mL 5   levothyroxine (SYNTHROID) 88 MCG tablet TAKE 1 TABLET BY MOUTH DAILY BEFORE BREAKFAST. 30 tablet 11   loratadine (CLARITIN) 10 MG tablet Take 10 mg by mouth in  the morning.     metoprolol succinate (TOPROL-XL) 100 MG 24 hr tablet Take 100 mg by mouth in the morning.     nitroGLYCERIN (NITROSTAT) 0.4 MG SL tablet Place 1 tablet (0.4 mg total) under the tongue every 5 (five) minutes as needed for chest pain. 90 tablet 3   Omega-3 Fatty Acids (FISH OIL) 1200 MG CPDR Take 1,200 mg by mouth in the morning.     omeprazole (PRILOSEC) 40 MG capsule TAKE 1 CAPSULE BY MOUTH ONCE DAILY. 90 capsule 1   sacubitril-valsartan (ENTRESTO) 24-26 MG Take 1 tablet by mouth 2 (two) times daily. 60 tablet 6   simvastatin (ZOCOR) 20 MG tablet TAKE 1 TABLET EVERY DAY AT 6 PM 30 tablet 5   spironolactone (ALDACTONE) 25 MG tablet Take 1 tablet (25 mg total) by mouth daily. 90 tablet 3   warfarin (COUMADIN) 2 MG tablet TAKE 1/2 TO 1 TABLET ONCE A DAY AS DIRECTED BY ANTICOAGULATION. (Patient taking differently: Take 1-2 mg by mouth See admin instructions. Take 0.5 tablet (1 mg) by mouth on Wednesday in the evening. Take 1 tablet (2 mg) by mouth on all other day of the week in the evening.) 45 tablet 5   INSULIN SYRINGE .5CC/29G 29G X 1/2" 0.5 ML MISC Use to give yourself daily basal  insulin (Lantus or Tuojeo). (Patient not taking: Reported on 05/10/2022) 100 each 6   No current facility-administered medications for this visit.     Past Medical History:  Diagnosis Date   Acute pain of right shoulder 03/22/2019   Acute renal failure ([place]) 04/15/2012   Last Assessment & Plan:  Superimposed on CK D stage III, Baseline Cr ~2-2.5, remains above baseline, felt to be due to Cardiorenal syndrome, creatinine trending down at time of discharge Negative urine eos and no urinary retention ACE inhibitor discontinued Follows with Dr Karen Chafe as outpatient, to have labs drawn 08/15/16 and results sent to [facility] office for review   Anemia 12/29/2016   Atrial fibrillation with RVR ([place])    Biventricular ICD (implantable cardioverter-defibrillator) in place    CHF (congestive heart failure)  ([place])    CKD (chronic kidney disease), stage IV ([place])    Colitis 09/11/2012   Complete heart block ([place])    Essential hypertension    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 Physical exam revealed cerumen impaction of left ear canal. Relieved by flushing.    Hypomagnesemia 04/10/2016   Hyponatremia 04/10/2016   Last Assessment & Plan:  - improved after tolvaptan 6/29, chiefly driven by CHF, further improvement with ongoing IV Lasix - continue low Na and fluid restriction - compliance with sodium restriction a major barrier to keeping patient out of hospital and compensated, remains resistant to this philosophy while inpatient - follows with Dr Karen Chafe as outpatient, Neph consult not indicated at this ti   ICD (implantable cardioverter-defibrillator) battery depletion 04/21/2014   Localized macular rash 08/11/2016   Last Assessment & Plan:  Seems to trend with heart failure, though she has more findings behind bilateral knees and up into her thigh/groin area than when I last saw her Would use steroid cream for now, consider alternative diagnosis if no improvement   Long-term insulin use in type 2 diabetes ([place]) 04/10/2016   Last Assessment & Plan:  Hypoglycemia resolved, continue SSI and hypoglycemia protocol   Nonischemic cardiomyopathy ([place])    On amiodarone therapy 08/21/2017   Persistent atrial fibrillation ([place])    AV node ablation 2019   Restless leg syndrome 03/22/2019   Type 2 diabetes mellitus ([place])     ROS:   All systems reviewed and negative except as noted in the HPI.   Past Surgical History:  Procedure Laterality Date   ABLATION     AV NODE ABLATION N/A 08/08/2017   Procedure: AV NODE ABLATION;  Surgeon: Thompson Grayer, MD;  [facility];  Service: Cardiovascular;  Laterality: N/A;    BREAST BIOPSY     CARDIAC DEFIBRILLATOR PLACEMENT  2005   MDT Viva XT CRT-D BiV ICD implanted by Dr Rosaland Lao in [place] Bilateral    [place] N/A 05/10/2021   Procedure: [place];  Surgeon: Evans Lance, MD;  Location: [facility];  Service: Cardiovascular;  Laterality: N/A;   RIGHT HEART CATH N/A 04/28/2022   Procedure: RIGHT HEART CATH;  Surgeon: Larey Dresser, MD;  Location: [facility];  Service: Cardiovascular;  Laterality: N/A;   RIGHT HEART CATH AND CORONARY ANGIOGRAPHY N/A 12/17/2020   Procedure: RIGHT HEART CATH AND CORONARY ANGIOGRAPHY;  Surgeon: Larey Dresser, MD;  Location: [facility];  Service: Cardiovascular;  Laterality: N/A;   TEE WITHOUT CARDIOVERSION N/A 12/17/2020   Procedure: TRANSESOPHAGEAL ECHOCARDIOGRAM (TEE);  Surgeon: Larey Dresser, MD;  Location: [facility]OS[facility];  Service: Cardiovascular;  Laterality: N/A;   THYROIDECTOMY     TOTAL VAGINAL HYSTERECTOMY       Family History  Problem Relation Age of Onset   Hip fracture Mother    COPD Father    CAD Sister    Heart attack Sister      Social History   Socioeconomic History   Marital status: Married    Spouse name: Not on file   Number of children: Not on file   Years of education: Not on file   Highest education level: Not on file  Occupational History   Not on file  Tobacco Use   Smoking status: Never   Smokeless tobacco: Never  Vaping Use   Vaping Use: Never used  Substance and Sexual Activity   Alcohol use: No   Drug use: No   Sexual activity: Not on file  Other Topics Concern   Not on file  Social History Narrative   Not on file   Social Determinants of Health   Financial Resource Strain: Not on file  Food Insecurity: Not on file  Transportation Needs: Not on file  Physical Activity: Not on file  Stress: Not on file  Social Connections: Not on file  Intimate Partner  Violence: Not on file     BP (!) 108/46   Pulse 67   Ht 5' 2.5" (1.588 m)   Wt 127 lb (57.6 kg)   SpO2 98%   BMI 22.86 kg/m   Physical Exam:  Well appearing NAD HEENT: Unremarkable Neck:  No JVD, no thyromegally Lymphatics:  No adenopathy Back:  No CVA tenderness Lungs:  scattered basilar rales HEART:  Regular rate rhythm, no murmurs, no rubs, no clicks Abd:  soft, positive bowel sounds, no organomegally, no rebound, no guarding Ext:  2 plus pulses, no edema, no cyanosis, no clubbing Skin:  No rashes no nodules Neuro:  CN II through XII intact, motor grossly intact  DEVICE  Normal device function.  See PaceArt for details.   Assess/Plan: Atrial fib - she is s/p AV node RFA and her rates are well controlled. PVC's - she has had about 15% PVC's and we have no good medical therapy. Today I increased her pacing rate from 60 to 80. The PVC's were markedly improved. Chronic systolic heart failure - her symptoms are class 3. Her optivol is still increased. ICD - her optivol is up and we increased her pacing rate.    Emily [name] Alias Villagran,MD

## 2022-05-10 NOTE — Patient Instructions (Signed)
Medication Instructions:  Your physician recommends that you continue on your current medications as directed. Please refer to the Current Medication list given to you today.  *If you need a refill on your cardiac medications before your next appointment, please call your pharmacy*   Lab Work: NONE   If you have labs (blood work) drawn today and your tests are completely normal, you will receive your results only by: MyChart Message (if you have MyChart) OR A paper copy in the mail If you have any lab test that is abnormal or we need to change your treatment, we will call you to review the results.   Testing/Procedures: NONE    Follow-Up: At [facility], you and your health needs are our priority.  As part of our continuing mission to provide you with exceptional heart care, we have created designated Provider Care Teams.  These Care Teams include your primary Cardiologist (physician) and Advanced Practice Providers (APPs -  Physician Assistants and Nurse Practitioners) who all work together to provide you with the care you need, when you need it.  We recommend signing up for the patient portal called "MyChart".  Sign up information is provided on this After Visit Summary.  MyChart is used to connect with patients for Virtual Visits (Telemedicine).  Patients are able to view lab/test results, encounter notes, upcoming appointments, etc.  Non-urgent messages can be sent to your provider as well.   To learn more about what you can do with MyChart, go to [email or web address].    Your next appointment:   1 year(s)  Provider:   Gregg Taylor, MD    Other Instructions Thank you for choosing [facility]!    

## 2022-05-10 NOTE — Progress Notes (Signed)
EPIC Encounter for ICM Monitoring  Patient Name: Emily [name] is a 72 y.o. female Date: 05/10/2022 Primary Care Physican: Ezequiel Essex, MD Primary Cardiologist: [facility] Electrophysiologist: Lovena Le Nephrologist: [facility] Associate-Dr Clover Mealy  Bi-V Pacing: 85% (Since 25-Apr-2022) 09/09/2021 Weight: 133-134 lbs 11/17/2021 Weight:  132 lbs 03/29/2022 Weight: 131 lbs 04/04/2022 Weight:  128 lbs 04/19/2022 Weight: 128 lbs 05/04/2022 Weight: 123 lbs     Time in AT/AF Off Atrial Sensitivity is Off. AT/AF monitoring and PR Logic are disable VT-NS (>4 beats, >150 bpm)    1   Spoke with patient and heart failure questions reviewed.  Transmission results reviewed.  Pt reports she continues to have cough, extreme weakness and headache.  She's had the cough about 8 weeks and still not feeling well even after taking prednisone last week.  She reports she has never felt as bad as she feels over the last 6 weeks.          Optivol Thoracic impedance continues to suggest fluid accumulation even after Furosemide dosage changed to 80 mg AM/40 mg PM dosage.   Fluid index higher than normal threshold starting 12/31.   Prescribed: Furosemide 80 mg Take 1 tablet (80 mg total) by mouth twice a day alternating with 80 mg every morning and 0.5 tablet (40 mg total) every evening.   Spironolactone 25 mg take 0.5 tablet by mouth daily   Labs:   04/19/2022 Creatinine 2.12, BUN 32, Potassium 4.0, Sodium 136, GFR 24 01/24/2022 Creatinine 2.31, BUN 31, Potassium 5.0, Sodium 136, GFR 22 01/14/2022 Creatinine 2.20, BUN 29, Potassium 3.1, sodium 136, GFR 23 12/14/2021 Creatinine 2.11, BUN 33, Potassium 3.9, Sodium 131, GFR 24, Glucose 502 07/29/2021 Creatinine 1.80, BUN 25, Potassium 4.2, Sodium 140, GFR 30 07/15/2021 Creatinine 1.64, BUN 17, Potassium 3.3, Sodium 140  A complete set of results can be found in Results Review.   Recommendations:  Any recommendations will be given at OV with Dr Lovena Le  today, 3/12.     Follow-up plan: ICM clinic phone appointment on 05/17/2022 to recheck fluid levels.  91 day device clinic remote transmission 05/10/2022.      EP/Cardiology Office Visits:  05/10/2022 with Dr Lovena Le (to evaluate BiV pacing).  05/23/2022 with HF clinic.       Copy of ICM check sent to Dr. Lovena Le.  3 month ICM trend: 05/10/2022.    12-14 Month ICM trend:     [name], RN 05/10/2022 11:59 AM

## 2022-05-12 ENCOUNTER — Ambulatory Visit: Payer: PPO | Attending: Cardiology | Admitting: *Deleted

## 2022-05-12 ENCOUNTER — Other Ambulatory Visit: Payer: Self-pay | Admitting: Cardiology

## 2022-05-12 DIAGNOSIS — I4891 Unspecified atrial fibrillation: Secondary | ICD-10-CM | POA: Diagnosis not present

## 2022-05-12 DIAGNOSIS — Z5181 Encounter for therapeutic drug level monitoring: Secondary | ICD-10-CM | POA: Diagnosis not present

## 2022-05-12 DIAGNOSIS — I513 Intracardiac thrombosis, not elsewhere classified: Secondary | ICD-10-CM | POA: Diagnosis not present

## 2022-05-12 DIAGNOSIS — I4821 Permanent atrial fibrillation: Secondary | ICD-10-CM

## 2022-05-12 LAB — POCT INR: INR: 5.6 — AB (ref 2.0–3.0)

## 2022-05-12 LAB — CUP PACEART REMOTE DEVICE CHECK
Battery Remaining Longevity: 95 mo
Battery Voltage: 3.01 V
Brady Statistic AP VP Percent: 0 %
Brady Statistic AP VS Percent: 0 %
Brady Statistic AS VP Percent: 0 %
Brady Statistic AS VS Percent: 0 %
Brady Statistic RA Percent Paced: 0 %
Brady Statistic RV Percent Paced: 84.99 %
Date Time Interrogation Session: 20240312044223
HighPow Impedance: 54 Ohm
Implantable Lead Connection Status: 753985
Implantable Lead Connection Status: 753985
Implantable Lead Connection Status: 753985
Implantable Lead Implant Date: 20050218
Implantable Lead Implant Date: 20050218
Implantable Lead Implant Date: 20050218
Implantable Lead Location: 753858
Implantable Lead Location: 753859
Implantable Lead Location: 753860
Implantable Lead Model: 4194
Implantable Lead Model: 5076
Implantable Lead Model: 6947
Implantable Pulse Generator Implant Date: 20230313
Lead Channel Impedance Value: 304 Ohm
Lead Channel Impedance Value: 304 Ohm
Lead Channel Impedance Value: 399 Ohm
Lead Channel Impedance Value: 513 Ohm
Lead Channel Impedance Value: 532 Ohm
Lead Channel Impedance Value: 665 Ohm
Lead Channel Pacing Threshold Amplitude: 1.125 V
Lead Channel Pacing Threshold Amplitude: 1.75 V
Lead Channel Pacing Threshold Pulse Width: 0.4 ms
Lead Channel Pacing Threshold Pulse Width: 0.4 ms
Lead Channel Sensing Intrinsic Amplitude: 1 mV
Lead Channel Sensing Intrinsic Amplitude: 4.5 mV
Lead Channel Sensing Intrinsic Amplitude: 4.5 mV
Lead Channel Setting Pacing Amplitude: 2.25 V
Lead Channel Setting Pacing Amplitude: 2.25 V
Lead Channel Setting Pacing Pulse Width: 0.4 ms
Lead Channel Setting Pacing Pulse Width: 0.4 ms
Lead Channel Setting Sensing Sensitivity: 0.3 mV
Zone Setting Status: 755011

## 2022-05-12 NOTE — Telephone Encounter (Signed)
Refill request for warfarin:  Last INR was 5.6 on 05/12/22 Next INR due 05/19/22 LOV was 05/10/22  Beckie Salts MD  Refill approved.

## 2022-05-12 NOTE — Patient Instructions (Signed)
Hold warfarin tonight and tomorrow night, take 1/2 tablet on Saturdays then resume 1 tablet daily except 1/2 tablet on Wednesdays  Pt denies any s/s of excessive bruising or bleeding.  Bleeding and fall precautions discussed with pt and she verbalized understanding. Recheck in 1 wk/lr

## 2022-05-17 NOTE — Progress Notes (Signed)
ICM remote transmission rescheduled for fluid level checks since pt has in office fluid level check with HF clinic on 3/20.  Next ICM remote transmission scheduled for 4/8.

## 2022-05-18 ENCOUNTER — Ambulatory Visit (HOSPITAL_COMMUNITY)
Admission: RE | Admit: 2022-05-18 | Discharge: 2022-05-18 | Disposition: A | Discharge status: Home / Self Care | Payer: PPO | Source: Ambulatory Visit | Attending: Family Medicine | Admitting: Family Medicine

## 2022-05-18 ENCOUNTER — Encounter (HOSPITAL_COMMUNITY): Payer: Self-pay

## 2022-05-18 VITALS — BP 130/70 | HR 82 | Wt 124.2 lb

## 2022-05-18 DIAGNOSIS — E1122 Type 2 diabetes mellitus with diabetic chronic kidney disease: Secondary | ICD-10-CM | POA: Diagnosis not present

## 2022-05-18 DIAGNOSIS — R42 Dizziness and giddiness: Secondary | ICD-10-CM | POA: Diagnosis not present

## 2022-05-18 DIAGNOSIS — I350 Nonrheumatic aortic (valve) stenosis: Secondary | ICD-10-CM

## 2022-05-18 DIAGNOSIS — Q211 Atrial septal defect, unspecified: Secondary | ICD-10-CM | POA: Diagnosis not present

## 2022-05-18 DIAGNOSIS — N184 Chronic kidney disease, stage 4 (severe): Secondary | ICD-10-CM | POA: Diagnosis not present

## 2022-05-18 DIAGNOSIS — M109 Gout, unspecified: Secondary | ICD-10-CM | POA: Insufficient documentation

## 2022-05-18 DIAGNOSIS — Z79899 Other long term (current) drug therapy: Secondary | ICD-10-CM | POA: Diagnosis not present

## 2022-05-18 DIAGNOSIS — I340 Nonrheumatic mitral (valve) insufficiency: Secondary | ICD-10-CM

## 2022-05-18 DIAGNOSIS — I493 Ventricular premature depolarization: Secondary | ICD-10-CM

## 2022-05-18 DIAGNOSIS — R21 Rash and other nonspecific skin eruption: Secondary | ICD-10-CM | POA: Insufficient documentation

## 2022-05-18 DIAGNOSIS — I4819 Other persistent atrial fibrillation: Secondary | ICD-10-CM

## 2022-05-18 DIAGNOSIS — I428 Other cardiomyopathies: Secondary | ICD-10-CM | POA: Insufficient documentation

## 2022-05-18 DIAGNOSIS — R0602 Shortness of breath: Secondary | ICD-10-CM | POA: Diagnosis present

## 2022-05-18 DIAGNOSIS — I5022 Chronic systolic (congestive) heart failure: Secondary | ICD-10-CM | POA: Insufficient documentation

## 2022-05-18 DIAGNOSIS — I272 Pulmonary hypertension, unspecified: Secondary | ICD-10-CM | POA: Diagnosis not present

## 2022-05-18 DIAGNOSIS — Z95810 Presence of automatic (implantable) cardiac defibrillator: Secondary | ICD-10-CM

## 2022-05-18 DIAGNOSIS — Z7984 Long term (current) use of oral hypoglycemic drugs: Secondary | ICD-10-CM | POA: Insufficient documentation

## 2022-05-18 DIAGNOSIS — R053 Chronic cough: Secondary | ICD-10-CM | POA: Insufficient documentation

## 2022-05-18 DIAGNOSIS — Z7901 Long term (current) use of anticoagulants: Secondary | ICD-10-CM | POA: Insufficient documentation

## 2022-05-18 DIAGNOSIS — Z794 Long term (current) use of insulin: Secondary | ICD-10-CM | POA: Diagnosis not present

## 2022-05-18 DIAGNOSIS — I080 Rheumatic disorders of both mitral and aortic valves: Secondary | ICD-10-CM | POA: Insufficient documentation

## 2022-05-18 DIAGNOSIS — I2510 Atherosclerotic heart disease of native coronary artery without angina pectoris: Secondary | ICD-10-CM | POA: Insufficient documentation

## 2022-05-18 DIAGNOSIS — Z8679 Personal history of other diseases of the circulatory system: Secondary | ICD-10-CM | POA: Insufficient documentation

## 2022-05-18 DIAGNOSIS — I4821 Permanent atrial fibrillation: Secondary | ICD-10-CM | POA: Insufficient documentation

## 2022-05-18 LAB — BASIC METABOLIC PANEL
Anion gap: 13 (ref 5–15)
BUN: 43 mg/dL — ABNORMAL HIGH (ref 8–23)
CO2: 27 mmol/L (ref 22–32)
Calcium: 8 mg/dL — ABNORMAL LOW (ref 8.9–10.3)
Chloride: 94 mmol/L — ABNORMAL LOW (ref 98–111)
Creatinine, Ser: 2.01 mg/dL — ABNORMAL HIGH (ref 0.44–1.00)
GFR, Estimated: 26 mL/min — ABNORMAL LOW (ref >60)
Glucose, Bld: 173 mg/dL — ABNORMAL HIGH (ref 70–99)
Potassium: 3.6 mmol/L (ref 3.5–5.1)
Sodium: 134 mmol/L — ABNORMAL LOW (ref 135–145)

## 2022-05-18 LAB — BRAIN NATRIURETIC PEPTIDE: B Natriuretic Peptide: 354 pg/mL — ABNORMAL HIGH (ref 0.0–100.0)

## 2022-05-18 NOTE — Progress Notes (Signed)
PCP: Ezequiel Essex, MD Cardiology: Dr. Domenic Polite HF Cardiology: Dr. Aundra Dubin EP: Dr. Lovena Le   72 y.o. with history of CKD stage IV, permanent atrial fibrillation, and a long-standing nonischemic cardiomyopathy was referred by Dr. Domenic Polite for CHF clinic evaluation.  She first developed cardiac problems in 1988.  At that time, she was admitted with CHF and eventually underwent myocardial biopsy at [facility], per her report showing myocarditis.  Patient states that her EF has been low since then.  She had MDT CRT-D device implanted.  Echo in 7/21 showed EF 20-25%, moderate MR, mild AS. She is now in permanent atrial fibrillation and is s/p AV nodal ablation with BiV pacing. Elevated creatinine has limited medication management.    Echo in 10/22 showed EF 25-30%, severe LV dilation, RV normal, moderate TR, mod-severe AS with mean gradient 9 mmHg but AVA 0.76 cm^2 (?low gradient severe AS), mild-moderate MR, left=>right shunt possible ASD.  TEE done in 10/22 to more closely assess AS.  This showed EF 30%, moderate LV dilation, normal RV, possible calcified vegetation on the ICD lead, small secundum ASD, peak RV-RA gradient 23 mmHg, low flow/low gradient moderate AS, mild-moderate MR. RHC/LHC was done, showing mild nonobstructive CAD, left to right atrial level shunt with Qp/Qs 1.43.  I reviewed TEE and cath report with Dr. Burt Knack.  We decided that AS was not severe enough yet for TAVR and that the ASD was not significant enough to close percutaneously. With possible calcified vegetation on ICD, we drew blood cultures which were negative.  She saw ID and had a PET-CT, which did not suggest active endocarditis.   Patient was found on Zio patch in 10/22 to have 12.6% PVCs, this was thought to be limiting her BiV pacing.  She has a history of intolerance of amiodarone so was started on mexiletine.  She says that this caused a rash so she stopped it and rash resolved. She also decreased Toprol XL to 100 mg daily due to  nightmares when taking 150 mg daily.  She subsequently has decreased Toprol XL to 50 mg daily.   Echo in 10/23 showed EF 25-30%, severe LV dilation, normal RV, mild MR, moderate AS with AVA 1.1 cm^2, mean gradient 9 mmHg (low flow low gradient moderate AS).   Follow up 2/24, volume stable but worse NYHA III symptoms with recent syncope concerning for orthostasis.  Zio 7-day (2/24): 7 runs of VT, continuous atrial fibrillation  RHC (2/24) showed elevated PCWP with prominent V-waves, mildly elevated RA pressure, preserved CO, moderate pulmonary venous hypertension, small ASD. Echo showed EF 30-35%, likely low flow/low gradient aortic stenosis, but cannot fully rule out severe AS.   Follow up with Dr. Lovena Le 05/10/22, with increased PVC burden, her pacing rate was increased from 60-80 and PVC improved.   Today she returns for post [place] HF follow up. Overall feeling fine. She has short of  breath walking on flat ground, this is her baseline. She is dizzy when standing, no further falls. Denies palpitations, abnormal bleeding, CP edema, or PND/Orthopnea. Appetite ok. No fever or chills. She has a chronic cough. Weight at home 123  pounds. Taking all medications. She has been taking Lasix 80 bid alternating with 80/40 every other day.  ECG (personally reviewed): none ordered today.  Device interrogation (personally reviewed): OptiVol up since 03/12/22, but thoracic impedence appears at reference line today, no VT, BiV pacing improved, now 90%, < 1 hr/day activity  Labs (1/21): LDL 118 Labs (9/21): K 4.4, creatinine 1.93  Labs (12/21): K 4, creatinine 1.77 Labs (2/22): TSH normal Labs (5/22): K 5, creatinine 2.42 Labs (9/22): K 4.3, creatinine 1.73, LDL 90 Labs (10/22): K 4.2, creatinine 2.18 Labs (2/23): K 3.8, creatinine 2.42 Labs (11/23): K 3.6, creatinine 2.22 Labs (2/24): K 3.9, creatinine 2.43  Medtronic device interrogation: Around 75% BiV pacing, decreased thoracic impedance, several episodes  of NSVT, no sustained VT/VF or shock.   PMH: 1. Gout 2. Complete heart block: Medtronic CRT device.  3. CKD stage IV: Diabetic nephropathy.  4. Type 2 diabetes 5. Atrial fibrillation: Now permanent.   - AF ablation 4/18.  - AV nodal ablation in 2019 with MDT CRT-D.  6. Remote LV thrombus 7. Chronic systolic CHF: Nonischemic cardiomyopathy. Medtronic CRT-D device.  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  - LHC 2004: Nonobstructive mild CAD.  - Echo (8/19): EF 10-15%, mildly dilated LV, mild-moderately dilated RV with mild-moderately decreased systolic function.  - Echo (7/21): EF 20-25%, low normal RV function, moderate MR, mild AS.  - Echo (10/22): EF 25-30%, severe LV dilation, RV normal, moderate TR, mod-severe AS with mean gradient 9 mmHg but AVA 0.76 cm^2 (?low gradient severe AS), mild-moderate MR, left=>right shunt possible ASD.  - Echo (10/23): EF 25-30%, severe LV dilation, normal RV, mild MR, moderate AS with AVA 1.1 cm^2, mean gradient 9 mmHg (low flow low gradient moderate AS). ASD noted.  - Echo (2/24): EF 30-35% (on Dr. Claris Gladden read), mildly dilated LV, normal RV, moderate MR, moderate AS with AVA  0.85 cm , mean gradient 13.5 mmHg (moderate low flow/low gradient aortic stenosis) - RHC/LHC (10/22): mild nonobstructive CAD, left to right atrial level shunt with Qp/Qs 1.43.  - RHC (2/24): RA mean 8, PA 52/21 (mean 31, PCPW mean 20 with prominent v-waves to 39, CO/CI (Fick) 4.92/3.09, PVR 2.2, Qp/Qs 1.29=>suggesting small ASD 8. H/o cholecystectomy 9. Hypothyroidism 10. 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 Aortic stenosis: TEE (10/22) showed EF 30%, moderate LV dilation, normal RV, possible calcified vegetation on the ICD lead, small secundum ASD, peak RV-RA gradient 23 mmHg, low flow/low gradient moderate AS, mild-moderate MR.  - Echo in 10/23 confirmed moderate low flow/low gradient AS.  - Echo (2/24) probably moderate low flow/low  gradient 13. 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  51. 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  - RHC in 10/22 showed atrial level shunt with Qp/Qs 1.43.  - RHC 2/24 showed Qp/Qs  1.29 15. PVCs: Zio monitor (10/22) with 12.6% PVCs. - Intolerant of both amiodarone and mexiletine.   SH: Married, lives in [place].  No smoking or ETOH.   Family History  Problem Relation Age of Onset   Hip fracture Mother    COPD Father    CAD Sister    Heart attack Sister    ROS: All systems reviewed and negative except as per HPI.   Current Outpatient Medications  Medication Sig Dispense Refill   acetaminophen (TYLENOL) 500 MG tablet Take 500 mg by mouth every 6 (six) hours as needed for mild pain.     allopurinol (ZYLOPRIM) 100 MG tablet Take 100-200 mg by mouth See admin instructions. Take 2 tablets (200 mg) by mouth in the morning & take 1 tablet (100 mg) by mouth at bedtime     empagliflozin (JARDIANCE) 10 MG TABS tablet Take 1 tablet (10 mg total) by mouth daily before breakfast. 30 tablet 11  furosemide (LASIX) 80 MG tablet Patient takes 1 tablet by mouth in the morning and 0.5 tablet in the evening (alternating) 1 tablet in the evening.     Glucosamine-Chondroitin (OSTEO BI-FLEX REGULAR STRENGTH PO) Take 1 tablet by mouth in the morning.     insulin glargine (LANTUS) 100 UNIT/ML Solostar Pen Inject 24 Units into the skin every morning. Use this insulin in place of Tuojeo 15 mL 5   INSULIN SYRINGE .5CC/29G 29G X 1/2" 0.5 ML MISC Use to give yourself daily basal insulin (Lantus or Tuojeo). 100 each 6   levothyroxine (SYNTHROID) 88 MCG tablet TAKE 1 TABLET BY MOUTH DAILY BEFORE BREAKFAST. 30 tablet 11   loratadine (CLARITIN) 10 MG tablet Take 10 mg by mouth in the morning.     metoprolol succinate (TOPROL-XL) 100 MG 24 hr tablet Take 100 mg by mouth in the morning.     nitroGLYCERIN (NITROSTAT) 0.4 MG SL tablet Place 1 tablet  (0.4 mg total) under the tongue every 5 (five) minutes as needed for chest pain. 90 tablet 3   Omega-3 Fatty Acids (FISH OIL) 1200 MG CPDR Take 1,200 mg by mouth in the morning.     omeprazole (PRILOSEC) 40 MG capsule TAKE 1 CAPSULE BY MOUTH ONCE DAILY. 90 capsule 1   sacubitril-valsartan (ENTRESTO) 24-26 MG Take 1 tablet by mouth 2 (two) times daily. 60 tablet 6   simvastatin (ZOCOR) 20 MG tablet TAKE 1 TABLET EVERY DAY AT 6 PM 30 tablet 5   spironolactone (ALDACTONE) 25 MG tablet Take 1 tablet (25 mg total) by mouth daily. 90 tablet 3   warfarin (COUMADIN) 2 MG tablet TAKE 1/2 TO 1 TABLET ONCE A DAY AS DIRECTED BY ANTICOAGULATION. 40 tablet 5   No current facility-administered medications for this encounter.   BP 130/70   Pulse 82   Wt 56.3 kg (124 lb 3.2 oz)   SpO2 99%   BMI 22.35 kg/m  Wt Readings from Last 3 Encounters:  05/18/22 56.3 kg (124 lb 3.2 oz)  05/10/22 57.6 kg (127 lb)  04/28/22 59 kg (130 lb)   PHYSICAL EXAM: General:  NAD. No resp difficulty, walked into clinic HEENT: Normal Neck: Supple. No JVD. Carotids 2+ bilat; no bruits. No lymphadenopathy or thryomegaly appreciated. Cor: PMI nondisplaced. Irregular rate & rhythm. No rubs, gallops or murmurs. Lungs: Clear Abdomen: Soft, nontender, nondistended. No hepatosplenomegaly. No bruits or masses. Good bowel sounds. Extremities: No cyanosis, clubbing, rash, edema Neuro: Alert & oriented x 3, cranial nerves grossly intact. Moves all 4 extremities w/o difficulty. Affect pleasant.  Assessment/Plan: 1. Chronic systolic CHF: Nonischemic cardiomyopathy.  Low EF for years.  Per patient's report, she had an endomyocardial biopsy at [facility] in 1988 showing myocarditis.  Medication titration has been limited by CKD stage IV. Has MDT CRT-D device, s/p AV nodal ablation with chronic AF. Echo in 7/21 showed EF 20-25%, moderate MR, mild AS.  Echo 12/17/20 showed EF 25-30%, severe LV dilation, RV normal, moderate TR, mod-severe AS with  mean gradient 9 mmHg but AVA 0.76 cm^2.  TEE in 10/22 with EF 30%.  LHC in 10/22 with mild nonobstructive CAD.  Echo in 10/23 showed EF 25-30%, severe LV dilation, normal RV, mild MR, moderate AS with AVA 1.1 cm^2, mean gradient 9 mmHg (low flow low gradient moderate AS), small ASD. RHC (2/24) with mildly elevated RA pressure, moderate pulmonary venous hypertension, preserved CO and elevated PCWP with prominent v-waves. Echo 2/24 showed EF 30-35%, likely low flow/low gradient aortic stenosis, but  cannot fully rule out severe AS, moderate MR & ASD visualized.  She continues with NYHA class III symptoms, device interrogation shows OptiVol elevated but thoracic impedence now at reference line. Her weight is down 6 lbs. She continue to have what sounds like orthostatic symptoms.  - Continue  Lasix 80 qam/40 qpm alternating with 80 bid every other day for now.  BMET/BNP today.  - I will ask device nurse to send OptiVol reading in a week to follow fluid. - Continue spironolactone 25 mg qhs  - Continue Jardiance 10 mg daily.  - Continue Entresto 24/26 bid. Would not increase with elevated creatinine.  - Continue Toprol XL 100 mg daily, cannot tolerate higher dose due to nightmares   2.  Atrial fibrillation: Permanent.  S/p AV nodal ablation with BiV pacing.   - Continue warfarin.  INRs followed by [facility].  3. PVCs: Zio 10/22 with 12.6% PVCs. BiV pacing percentage has been up to >95% but recently down to 75%, ?increasing PVCs again. Pacing rate increased at recent EP visit and now BiV pacing percentage up to 90%. - Continue Toprol. - Unable to tolerate amiodarone in the past.  - She had a rash with mexiletine and stopped it.  4. Aortic stenosis: TEE in 10/22 showed low flow/low gradient moderate aortic stenosis.  Dr. Aundra Dubin discussed with Dr. Burt Knack, at that point was not felt to need TAVR. Echo in 10/23 with moderate low flow/low gradient AS. Most recent echo 2/24 suggests probably moderate low  flow/low gradient AS. Need TEE to further evaluate. We discussed risks/benefits. She is agreeable to proceed. Discussed with Dr. Aundra Dubin. 5. ASD: Patient has an ASD by echo with L=>R shunting, small.  Qp/Qs by RHC was 1.43. Small on last echo.  - Qp/Qs on RHC 2/24 was 1.29. As above, arrange TEE to further evaluate.  6. Mitral regurgitation: Moderate on most recent echo. TEE arranged.  7. Gout: Stable.  8. Calcified device lead vegetation: Blood cultures negative, PET-CT in 11/22 not suggestive of active endocarditis.   Arrange TEE with Dr. Aundra Dubin. Follow up with APP 3-4 weeks afterwards, sooner pending TEE results.  [place], FNP-BC 05/18/2022

## 2022-05-18 NOTE — Patient Instructions (Addendum)
Thank you for coming in today  Labs were done today, if any labs are abnormal the clinic will call you No news is good news  Medications: No changes   Follow up appointments:  Your physician recommends that you schedule a follow-up appointment in: 1 month after TEE   You are scheduled for a TEE on April 9th 2024 with Dr. Aundra Dubin.  Please arrive at the [facility] (Main Entrance A) at [facility]: [address] [place], [place] [address] at 2pm. (1 hour prior to procedure unless lab work is needed; if lab work is needed arrive 1.5 hours ahead)  DIET: Nothing to eat or drink after midnight except a sip of water with medications (see medication instructions below)  Medication Instructions: Hold lasix  Continue your anticoagulant:  You will need to continue your anticoagulant after your procedure until you  are told by your  Provider that it is safe to stop   Labs: If patient is on Coumadin, patient needs pt/INR, CBC, BMET within 3 days (No pt/INR needed for patients taking Xarelto, Eliquis, Pradaxa) For patients receiving anesthesia for TEE and all Cardioversion patients: BMET, CBC within 1 week   You must have a responsible person to drive you home and stay in the waiting area during your procedure. Failure to do so could result in cancellation.  Bring your insurance cards.  *Special Note: Every effort is made to have your procedure done on time. Occasionally there are emergencies that occur at the hospital that may cause delays. Please be patient if a delay does occur.      Do the following things EVERYDAY: Weigh yourself in the morning before breakfast. Write it down and keep it in a log. Take your medicines as prescribed Eat low salt foods--Limit salt (sodium) to 2000 mg per day.  Stay as active as you can everyday Limit all fluids for the day to less than 2 liters   At the [facility], you and your health needs are our priority. As part of our  continuing mission to provide you with exceptional heart care, we have created designated Provider Care Teams. These Care Teams include your primary Cardiologist (physician) and Advanced Practice Providers (APPs- Physician Assistants and Nurse Practitioners) who all work together to provide you with the care you need, when you need it.   You may see any of the following providers on your designated Care Team at your next follow up: Dr Glori Bickers Dr Loralie Champagne Dr. Roxana Hires, NP Lyda Jester, [place] [facility] [place], [place] Forestine Na, NP Audry Riles, PharmD   Please be sure to bring in all your medications bottles to every appointment.    Thank you for choosing [facility]  If you have any questions or concerns before your next appointment please send [place] a message through [place] or call our office at [phone].    TO LEAVE A MESSAGE FOR THE NURSE SELECT OPTION 2, PLEASE LEAVE A MESSAGE INCLUDING: YOUR NAME DATE OF BIRTH CALL BACK NUMBER REASON FOR CALL**this is important as we prioritize the call backs  YOU WILL RECEIVE A CALL BACK THE SAME DAY AS LONG AS YOU CALL BEFORE 4:00 PM

## 2022-05-19 ENCOUNTER — Ambulatory Visit: Payer: PPO | Attending: Cardiology

## 2022-05-19 DIAGNOSIS — Z5181 Encounter for therapeutic drug level monitoring: Secondary | ICD-10-CM

## 2022-05-19 DIAGNOSIS — I513 Intracardiac thrombosis, not elsewhere classified: Secondary | ICD-10-CM | POA: Diagnosis not present

## 2022-05-19 DIAGNOSIS — I4891 Unspecified atrial fibrillation: Secondary | ICD-10-CM

## 2022-05-19 LAB — POCT INR: INR: 2.1 (ref 2.0–3.0)

## 2022-05-19 NOTE — Patient Instructions (Signed)
Description   Continue on same dosage of Warfarin 1 tablet daily except 1/2 tablet on Wednesdays  Recheck in 2 weeks.

## 2022-05-20 ENCOUNTER — Other Ambulatory Visit (HOSPITAL_COMMUNITY): Payer: Self-pay

## 2022-05-20 DIAGNOSIS — I5022 Chronic systolic (congestive) heart failure: Secondary | ICD-10-CM

## 2022-05-23 ENCOUNTER — Encounter (HOSPITAL_COMMUNITY): Payer: PPO

## 2022-05-24 NOTE — Addendum Note (Signed)
Encounter addended by: Micki Riley, RN on: 05/24/2022 11:29 AM  Actions taken: Imaging Exam ended

## 2022-05-25 ENCOUNTER — Telehealth: Payer: Self-pay

## 2022-05-25 ENCOUNTER — Encounter: Payer: Self-pay | Admitting: Cardiology

## 2022-05-25 ENCOUNTER — Ambulatory Visit (INDEPENDENT_AMBULATORY_CARE_PROVIDER_SITE_OTHER): Payer: PPO

## 2022-05-25 DIAGNOSIS — Z95810 Presence of automatic (implantable) cardiac defibrillator: Secondary | ICD-10-CM

## 2022-05-25 DIAGNOSIS — I5022 Chronic systolic (congestive) heart failure: Secondary | ICD-10-CM

## 2022-05-25 MED ORDER — FUROSEMIDE 40 MG PO TABS: ORAL_TABLET | ORAL | 2 refills | Status: DC

## 2022-05-25 NOTE — Telephone Encounter (Signed)
Spoke with patient and requested to send remote transmission for review and she agreed.

## 2022-05-25 NOTE — Progress Notes (Signed)
Spoke with patient and advised Allena Katz, NP agreed to change Lasix dosage to 80 mg every morning and 40 mg every evening.  Prescription updated and she has supply on hand.  Explained to call pharmacy when she is ready for refill and ask for the new script to be filled.  She verbalized understanding.

## 2022-05-25 NOTE — Progress Notes (Signed)
EPIC Encounter for ICM Monitoring  Patient Name: Emily [name] is a 72 y.o. female Date: 05/25/2022 Primary Care Physican: Ezequiel Essex, MD Primary Cardiologist: [facility] Electrophysiologist: Lovena Le Nephrologist: [facility]-Dr Clover Mealy  Bi-V Pacing: 92.7%  09/09/2021 Weight: 133-134 lbs 11/17/2021 Weight:  132 lbs 03/29/2022 Weight: 131 lbs 04/04/2022 Weight:  128 lbs 04/19/2022 Weight: 128 lbs 05/04/2022 Weight: 123 lbs    Time in AT/AF  Off Atrial Sensitivity is Off. AT/AF monitoring and PR Logic are disable VT-NS (>4 beats, >150 bpm)    2   Spoke with patient and heart failure questions reviewed.  Transmission results reviewed.  Pt reports extreme dizziness and weakness when she takes the Furosemide dosage of 80 mg twice a day and unable to tolerate that 2nd 80 mg dosage on the alternate days.            Optivol Thoracic impedance suggesting normal fluid levels since 3/18.   Prescribed: Furosemide 80 mg Take 1 tablet (80 mg total) by mouth twice a day ALTERNATING with 80 mg every morning and 0.5 tablet (40 mg total) every evening.     Spironolactone 25 mg take 0.5 tablet by mouth daily   Labs:   04/19/2022 Creatinine 2.12, BUN 32, Potassium 4.0, Sodium 136, GFR 24 01/24/2022 Creatinine 2.31, BUN 31, Potassium 5.0, Sodium 136, GFR 22 01/14/2022 Creatinine 2.20, BUN 29, Potassium 3.1, sodium 136, GFR 23 12/14/2021 Creatinine 2.11, BUN 33, Potassium 3.9, Sodium 131, GFR 24, Glucose 502 07/29/2021 Creatinine 1.80, BUN 25, Potassium 4.2, Sodium 140, GFR 30 07/15/2021 Creatinine 1.64, BUN 17, Potassium 3.3, Sodium 140  A complete set of results can be found in Results Review.   Recommendations:  Copy sent to [facility] as requested following 3/20 OV.   Pt is asking she can change the Furosemide dosage to 80 mg every morning and 40 mg every evening.   She reports extreme weakness and dizziness if she takes 80 mg twice a day.     Follow-up plan: ICM clinic  phone appointment on 06/06/2022.  91 day device clinic remote transmission 08/09/2022.      EP/Cardiology Office Visits: 08/23/2022 with Dr Lovena Le.  07/06/2022 with HF clinic.       Copy of ICM check sent to Dr. Lovena Le and Allena Katz, NP at [place] [facility].  3 month ICM trend: 05/25/2022.    12-14 Month ICM trend:     [name], RN 05/25/2022 10:44 AM

## 2022-05-25 NOTE — Progress Notes (Signed)
  Received: Today [place], Maricela Bo, FNP  Adel Neyer Panda, RN Thank you. OK to keep Lasix at 80/40. Thanks!

## 2022-05-27 ENCOUNTER — Telehealth (HOSPITAL_COMMUNITY): Payer: Self-pay

## 2022-05-27 NOTE — Telephone Encounter (Signed)
Patient says she needs to reschedule her TEE.  Do you do these?

## 2022-05-27 NOTE — Telephone Encounter (Signed)
Called patient to reschedule her TEE she is unsure when her daughter can bring. She said she will call back on Monday with what days work for her so we can reschedule for one of those days

## 2022-05-30 ENCOUNTER — Telehealth (HOSPITAL_COMMUNITY): Payer: Self-pay | Admitting: Vascular Surgery

## 2022-05-30 NOTE — Telephone Encounter (Signed)
Spoke with patient. Have gotten TEE rescheduled for 04/25

## 2022-05-30 NOTE — Telephone Encounter (Signed)
Pt would like to reschedule 4/9 TTE

## 2022-06-02 ENCOUNTER — Ambulatory Visit: Payer: PPO | Attending: Cardiology | Admitting: *Deleted

## 2022-06-02 DIAGNOSIS — I4891 Unspecified atrial fibrillation: Secondary | ICD-10-CM | POA: Diagnosis not present

## 2022-06-02 DIAGNOSIS — I513 Intracardiac thrombosis, not elsewhere classified: Secondary | ICD-10-CM

## 2022-06-02 DIAGNOSIS — Z5181 Encounter for therapeutic drug level monitoring: Secondary | ICD-10-CM | POA: Diagnosis not present

## 2022-06-02 LAB — POCT INR: INR: 2.5 (ref 2.0–3.0)

## 2022-06-02 NOTE — Patient Instructions (Signed)
Continue on same dosage of Warfarin 1 tablet daily except 1/2 tablet on Wednesdays  Recheck in 3 weeks.

## 2022-06-06 ENCOUNTER — Ambulatory Visit (INDEPENDENT_AMBULATORY_CARE_PROVIDER_SITE_OTHER): Payer: PPO

## 2022-06-06 ENCOUNTER — Ambulatory Visit: Payer: PPO | Attending: Internal Medicine

## 2022-06-06 DIAGNOSIS — Z95810 Presence of automatic (implantable) cardiac defibrillator: Secondary | ICD-10-CM

## 2022-06-06 DIAGNOSIS — I5022 Chronic systolic (congestive) heart failure: Secondary | ICD-10-CM

## 2022-06-06 LAB — CUP PACEART REMOTE DEVICE CHECK
Battery Remaining Longevity: 96 mo
Battery Remaining Longevity: 96 mo
Battery Remaining Longevity: 96 mo
Battery Voltage: 3.01 V
Battery Voltage: 3.01 V
Battery Voltage: 3.01 V
Brady Statistic AP VP Percent: 0 %
Brady Statistic AP VP Percent: 0 %
Brady Statistic AP VP Percent: 0 %
Brady Statistic AP VS Percent: 0 %
Brady Statistic AP VS Percent: 0 %
Brady Statistic AP VS Percent: 0 %
Brady Statistic AS VP Percent: 0 %
Brady Statistic AS VP Percent: 0 %
Brady Statistic AS VP Percent: 0 %
Brady Statistic AS VS Percent: 0 %
Brady Statistic AS VS Percent: 0 %
Brady Statistic AS VS Percent: 0 %
Brady Statistic RA Percent Paced: 0 %
Brady Statistic RA Percent Paced: 0 %
Brady Statistic RA Percent Paced: 0 %
Brady Statistic RV Percent Paced: 96.58 %
Brady Statistic RV Percent Paced: 96.58 %
Brady Statistic RV Percent Paced: 96.58 %
Date Time Interrogation Session: 20240408043822
Date Time Interrogation Session: 20240408043822
Date Time Interrogation Session: 20240408043822
HighPow Impedance: 62 Ohm
HighPow Impedance: 62 Ohm
HighPow Impedance: 62 Ohm
Implantable Lead Connection Status: 753985
Implantable Lead Connection Status: 753985
Implantable Lead Connection Status: 753985
Implantable Lead Connection Status: 753985
Implantable Lead Connection Status: 753985
Implantable Lead Connection Status: 753985
Implantable Lead Connection Status: 753985
Implantable Lead Connection Status: 753985
Implantable Lead Connection Status: 753985
Implantable Lead Implant Date: 20050218
Implantable Lead Implant Date: 20050218
Implantable Lead Implant Date: 20050218
Implantable Lead Implant Date: 20050218
Implantable Lead Implant Date: 20050218
Implantable Lead Implant Date: 20050218
Implantable Lead Implant Date: 20050218
Implantable Lead Implant Date: 20050218
Implantable Lead Implant Date: 20050218
Implantable Lead Location: 753858
Implantable Lead Location: 753858
Implantable Lead Location: 753859
Implantable Lead Location: 753859
Implantable Lead Location: 753860
Implantable Lead Location: 753860
Implantable Lead Location: 753858
Implantable Lead Location: 753859
Implantable Lead Location: 753860
Implantable Lead Model: 4194
Implantable Lead Model: 4194
Implantable Lead Model: 5076
Implantable Lead Model: 5076
Implantable Lead Model: 6947
Implantable Lead Model: 6947
Implantable Lead Model: 4194
Implantable Lead Model: 5076
Implantable Lead Model: 6947
Implantable Pulse Generator Implant Date: 20230313
Implantable Pulse Generator Implant Date: 20230313
Implantable Pulse Generator Implant Date: 20230313
Lead Channel Impedance Value: 342 Ohm
Lead Channel Impedance Value: 342 Ohm
Lead Channel Impedance Value: 342 Ohm
Lead Channel Impedance Value: 342 Ohm
Lead Channel Impedance Value: 399 Ohm
Lead Channel Impedance Value: 399 Ohm
Lead Channel Impedance Value: 589 Ohm
Lead Channel Impedance Value: 589 Ohm
Lead Channel Impedance Value: 589 Ohm
Lead Channel Impedance Value: 589 Ohm
Lead Channel Impedance Value: 836 Ohm
Lead Channel Impedance Value: 836 Ohm
Lead Channel Impedance Value: 342 Ohm
Lead Channel Impedance Value: 342 Ohm
Lead Channel Impedance Value: 399 Ohm
Lead Channel Impedance Value: 589 Ohm
Lead Channel Impedance Value: 589 Ohm
Lead Channel Impedance Value: 836 Ohm
Lead Channel Pacing Threshold Amplitude: 1.25 V
Lead Channel Pacing Threshold Amplitude: 1.25 V
Lead Channel Pacing Threshold Amplitude: 2.125 V
Lead Channel Pacing Threshold Amplitude: 2.125 V
Lead Channel Pacing Threshold Amplitude: 1.25 V
Lead Channel Pacing Threshold Amplitude: 2.125 V
Lead Channel Pacing Threshold Pulse Width: 0.4 ms
Lead Channel Pacing Threshold Pulse Width: 0.4 ms
Lead Channel Pacing Threshold Pulse Width: 0.4 ms
Lead Channel Pacing Threshold Pulse Width: 0.4 ms
Lead Channel Pacing Threshold Pulse Width: 0.4 ms
Lead Channel Pacing Threshold Pulse Width: 0.4 ms
Lead Channel Sensing Intrinsic Amplitude: 1 mV
Lead Channel Sensing Intrinsic Amplitude: 1 mV
Lead Channel Sensing Intrinsic Amplitude: 5 mV
Lead Channel Sensing Intrinsic Amplitude: 5 mV
Lead Channel Sensing Intrinsic Amplitude: 5 mV
Lead Channel Sensing Intrinsic Amplitude: 5 mV
Lead Channel Sensing Intrinsic Amplitude: 1 mV
Lead Channel Sensing Intrinsic Amplitude: 5 mV
Lead Channel Sensing Intrinsic Amplitude: 5 mV
Lead Channel Setting Pacing Amplitude: 2.25 V
Lead Channel Setting Pacing Amplitude: 2.25 V
Lead Channel Setting Pacing Amplitude: 2.5 V
Lead Channel Setting Pacing Amplitude: 2.5 V
Lead Channel Setting Pacing Amplitude: 2.25 V
Lead Channel Setting Pacing Amplitude: 2.5 V
Lead Channel Setting Pacing Pulse Width: 0.4 ms
Lead Channel Setting Pacing Pulse Width: 0.4 ms
Lead Channel Setting Pacing Pulse Width: 0.4 ms
Lead Channel Setting Pacing Pulse Width: 0.4 ms
Lead Channel Setting Pacing Pulse Width: 0.4 ms
Lead Channel Setting Pacing Pulse Width: 0.4 ms
Lead Channel Setting Sensing Sensitivity: 0.3 mV
Lead Channel Setting Sensing Sensitivity: 0.3 mV
Lead Channel Setting Sensing Sensitivity: 0.3 mV
Zone Setting Status: 755011
Zone Setting Status: 755011
Zone Setting Status: 755011

## 2022-06-06 NOTE — Progress Notes (Unsigned)
EPIC Encounter for ICM Monitoring  Patient Name: Emily [name] is a 72 y.o. female Date: 06/06/2022 Primary Care Physican: Fayette Pho, MD Primary Cardiologist: [facility] Electrophysiologist: Ladona Ridgel Nephrologist: [facility]-Dr Lacy Duverney  Bi-V Pacing: 96.6%  04/04/2022 Weight:  128 lbs 04/19/2022 Weight: 128 lbs 05/04/2022   Weight: 123 lbs 06/08/2022 Weight: 122-123 lbs    Time in AT/AF  Off Atrial Sensitivity is Off. AT/AF monitoring and PR Logic are disable VT-NS (>4 beats, >150 bpm)    2   Spoke with patient and heart failure questions reviewed.  Transmission results reviewed.  Pt reports she still has swimmy headed feeling upon rising in the morning but resolves once she has been out of bed for a while.         Optivol Thoracic impedance suggesting normal fluid levels since 3/18.   Prescribed: Furosemide 80 mg Take 1 tablet (80 mg total) by mouth every morning and 0.5 tablet (40 mg total) every evening.     Spironolactone 25 mg take 0.5 tablet by mouth daily   Labs:   05/18/2022 Creatinine 2.01, BUN 43, Potassium 3.6, Sodium 134, GFR 26 04/19/2022 Creatinine 2.12, BUN 32, Potassium 4.0, Sodium 136, GFR 24 01/24/2022 Creatinine 2.31, BUN 31, Potassium 5.0, Sodium 136, GFR 22 A complete set of results can be found in Results Review.   Recommendations:  No changes and encouraged to call if experiencing any fluid symptoms.   Follow-up plan: ICM clinic phone appointment on 07/11/2022.  91 day device clinic remote transmission 08/09/2022.      EP/Cardiology Office Visits: 08/23/2022 with Dr Ladona Ridgel.  07/06/2022 with HF clinic.       Copy of ICM check sent to Dr. Ladona Ridgel  3 month ICM trend: 06/08/2022.    12-14 Month ICM trend:     Karie Soda, RN 06/06/2022 8:29 AM

## 2022-06-09 ENCOUNTER — Other Ambulatory Visit (HOSPITAL_COMMUNITY): Payer: Self-pay

## 2022-06-16 ENCOUNTER — Other Ambulatory Visit (HOSPITAL_COMMUNITY): Payer: Self-pay | Admitting: Cardiology

## 2022-06-16 ENCOUNTER — Other Ambulatory Visit: Payer: Self-pay

## 2022-06-16 ENCOUNTER — Other Ambulatory Visit: Payer: Self-pay | Admitting: Cardiology

## 2022-06-16 DIAGNOSIS — E039 Hypothyroidism, unspecified: Secondary | ICD-10-CM

## 2022-06-19 MED ORDER — LEVOTHYROXINE SODIUM 88 MCG PO TABS: 88.0000 ug | ORAL_TABLET | Freq: Every day | ORAL | 3 refills | Status: DC

## 2022-06-20 NOTE — Progress Notes (Signed)
Remote ICD transmission.   

## 2022-06-21 ENCOUNTER — Ambulatory Visit: Payer: PPO | Attending: Cardiology | Admitting: *Deleted

## 2022-06-21 DIAGNOSIS — I513 Intracardiac thrombosis, not elsewhere classified: Secondary | ICD-10-CM | POA: Diagnosis not present

## 2022-06-21 DIAGNOSIS — I4891 Unspecified atrial fibrillation: Secondary | ICD-10-CM | POA: Diagnosis not present

## 2022-06-21 DIAGNOSIS — Z5181 Encounter for therapeutic drug level monitoring: Secondary | ICD-10-CM | POA: Diagnosis not present

## 2022-06-21 LAB — POCT INR: INR: 2 (ref 2.0–3.0)

## 2022-06-21 NOTE — Patient Instructions (Signed)
Continue on same dosage of Warfarin 1 tablet daily except 1/2 tablet on Wednesdays  Recheck in 4 weeks.

## 2022-06-22 ENCOUNTER — Telehealth (HOSPITAL_COMMUNITY): Payer: Self-pay

## 2022-06-22 NOTE — Pre-Procedure Instructions (Signed)
Spoke to pt on phone regarding instructions for TEE tomorrow - arrive at 1300, NPO after 0000, confirmed patient has ride home and responsible person to stay with patient for 24 hours after

## 2022-06-22 NOTE — Telephone Encounter (Signed)
Called patient to remind her about procedure scheduled for tomorrow. Aware of time,nothing to eat or drink after midnight.Holding Lasix am of procedure. Continue anticoagulation . Has transportation to and from procedure

## 2022-06-23 ENCOUNTER — Encounter (HOSPITAL_COMMUNITY): Payer: Self-pay | Admitting: Cardiology

## 2022-06-23 ENCOUNTER — Ambulatory Visit (HOSPITAL_BASED_OUTPATIENT_CLINIC_OR_DEPARTMENT_OTHER): Payer: PPO | Admitting: Anesthesiology

## 2022-06-23 ENCOUNTER — Other Ambulatory Visit: Payer: Self-pay

## 2022-06-23 ENCOUNTER — Ambulatory Visit (HOSPITAL_BASED_OUTPATIENT_CLINIC_OR_DEPARTMENT_OTHER)
Admission: RE | Admit: 2022-06-23 | Discharge: 2022-06-23 | Disposition: A | Discharge status: Home / Self Care | Payer: PPO | Source: Ambulatory Visit | Attending: Cardiology | Admitting: Cardiology

## 2022-06-23 ENCOUNTER — Encounter (HOSPITAL_COMMUNITY)
Admission: RE | Disposition: A | Discharge status: Home / Self Care | Payer: Self-pay | Source: Home / Self Care | Attending: Cardiology

## 2022-06-23 ENCOUNTER — Ambulatory Visit (HOSPITAL_COMMUNITY)
Admission: RE | Admit: 2022-06-23 | Discharge: 2022-06-23 | Disposition: A | Discharge status: Home / Self Care | Payer: PPO | Attending: Cardiology | Admitting: Cardiology

## 2022-06-23 ENCOUNTER — Ambulatory Visit (HOSPITAL_COMMUNITY): Payer: PPO | Admitting: Anesthesiology

## 2022-06-23 DIAGNOSIS — I130 Hypertensive heart and chronic kidney disease with heart failure and stage 1 through stage 4 chronic kidney disease, or unspecified chronic kidney disease: Secondary | ICD-10-CM | POA: Diagnosis not present

## 2022-06-23 DIAGNOSIS — Z8249 Family history of ischemic heart disease and other diseases of the circulatory system: Secondary | ICD-10-CM | POA: Insufficient documentation

## 2022-06-23 DIAGNOSIS — I2510 Atherosclerotic heart disease of native coronary artery without angina pectoris: Secondary | ICD-10-CM

## 2022-06-23 DIAGNOSIS — I083 Combined rheumatic disorders of mitral, aortic and tricuspid valves: Secondary | ICD-10-CM | POA: Diagnosis not present

## 2022-06-23 DIAGNOSIS — I350 Nonrheumatic aortic (valve) stenosis: Secondary | ICD-10-CM

## 2022-06-23 DIAGNOSIS — I509 Heart failure, unspecified: Secondary | ICD-10-CM | POA: Insufficient documentation

## 2022-06-23 DIAGNOSIS — Q2111 Secundum atrial septal defect: Secondary | ICD-10-CM | POA: Insufficient documentation

## 2022-06-23 DIAGNOSIS — E1122 Type 2 diabetes mellitus with diabetic chronic kidney disease: Secondary | ICD-10-CM

## 2022-06-23 DIAGNOSIS — I4819 Other persistent atrial fibrillation: Secondary | ICD-10-CM

## 2022-06-23 DIAGNOSIS — N189 Chronic kidney disease, unspecified: Secondary | ICD-10-CM

## 2022-06-23 DIAGNOSIS — I340 Nonrheumatic mitral (valve) insufficiency: Secondary | ICD-10-CM

## 2022-06-23 HISTORY — PX: TEE WITHOUT CARDIOVERSION: SHX5443

## 2022-06-23 LAB — POCT I-STAT, CHEM 8
BUN: 30 mg/dL — ABNORMAL HIGH (ref 8–23)
Calcium, Ion: 0.87 mmol/L — CL (ref 1.15–1.40)
Chloride: 111 mmol/L (ref 98–111)
Creatinine, Ser: 1.3 mg/dL — ABNORMAL HIGH (ref 0.44–1.00)
Glucose, Bld: 119 mg/dL — ABNORMAL HIGH (ref 70–99)
HCT: 28 % — ABNORMAL LOW (ref 36.0–46.0)
Hemoglobin: 9.5 g/dL — ABNORMAL LOW (ref 12.0–15.0)
Potassium: 3.1 mmol/L — ABNORMAL LOW (ref 3.5–5.1)
Sodium: 145 mmol/L (ref 135–145)
TCO2: 21 mmol/L — ABNORMAL LOW (ref 22–32)

## 2022-06-23 LAB — ECHO TEE
AR max vel: 2.13 cm2
AV Area VTI: 2.12 cm2
AV Area mean vel: 2.29 cm2
AV Mean grad: 11 mmHg
AV Peak grad: 15.4 mmHg
Ao pk vel: 1.96 m/s
MV M vel: 5.37 m/s
MV Peak grad: 115.3 mmHg
Radius: 0.7 cm

## 2022-06-23 SURGERY — ECHOCARDIOGRAM, TRANSESOPHAGEAL
Anesthesia: Monitor Anesthesia Care

## 2022-06-23 MED ORDER — BUTAMBEN-TETRACAINE-BENZOCAINE 2-2-14 % EX AERO
INHALATION_SPRAY | CUTANEOUS | Status: DC | PRN
  Administered 2022-06-23: 1 via TOPICAL

## 2022-06-23 MED ORDER — PROPOFOL 10 MG/ML IV BOLUS
INTRAVENOUS | Status: DC | PRN
  Administered 2022-06-23: 20 mg via INTRAVENOUS
  Administered 2022-06-23: 10 mg via INTRAVENOUS

## 2022-06-23 MED ORDER — PROPOFOL 500 MG/50ML IV EMUL
INTRAVENOUS | Status: DC | PRN
  Administered 2022-06-23: 100 ug/kg/min via INTRAVENOUS

## 2022-06-23 MED ORDER — ETOMIDATE 2 MG/ML IV SOLN
INTRAVENOUS | Status: DC | PRN
  Administered 2022-06-23 (×2): 2 mg via INTRAVENOUS

## 2022-06-23 MED ORDER — PHENYLEPHRINE HCL-NACL 20-0.9 MG/250ML-% IV SOLN
INTRAVENOUS | Status: DC | PRN
  Administered 2022-06-23: 20 ug/min via INTRAVENOUS

## 2022-06-23 MED ORDER — PHENYLEPHRINE 80 MCG/ML (10ML) SYRINGE FOR IV PUSH (FOR BLOOD PRESSURE SUPPORT)
PREFILLED_SYRINGE | INTRAVENOUS | Status: DC | PRN
  Administered 2022-06-23: 80 ug via INTRAVENOUS

## 2022-06-23 MED ORDER — ONDANSETRON HCL 4 MG/2ML IJ SOLN
INTRAMUSCULAR | Status: DC | PRN
  Administered 2022-06-23: 4 mg via INTRAVENOUS

## 2022-06-23 MED ORDER — SODIUM CHLORIDE 0.9 % IV SOLN: INTRAVENOUS | Status: DC | PRN

## 2022-06-23 NOTE — Transfer of Care (Signed)
Immediate Anesthesia Transfer of Care Note  Patient: Emily [name]  Procedure(s) Performed: TRANSESOPHAGEAL ECHOCARDIOGRAM  Patient Location: PACU and Cath Lab  Anesthesia Type:MAC  Level of Consciousness: drowsy and patient cooperative  Airway & Oxygen Therapy: Patient Spontanous Breathing and Patient connected to nasal cannula oxygen  Post-op Assessment: Report given to RN and Post -op Vital signs reviewed and stable  Post vital signs: Reviewed and stable  Last Vitals:  Vitals Value Taken Time  BP    Temp 36.8 C 06/23/22 1409  Pulse    Resp    SpO2      Last Pain:  Vitals:   06/23/22 1409  TempSrc: Temporal  PainSc: Asleep         Complications: No notable events documented.

## 2022-06-23 NOTE — H&P (Signed)
Advanced Heart Failure Team History and Physical Note   PCP:  Fayette Pho, MD  PCP-Cardiology: Marca Ancona, MD     Reason for Admission: TEE   HPI:    Patient admitted for TEE for assessment of low flow/low gradient severe AS.     Review of Systems: [y] = yes,  = no   General: Weight gain ; Weight loss ; Anorexia ; Fatigue ; Fever ; Chills ; Weakness   Cardiac: Chest pain/pressure ; Resting SOB ; Exertional SOB ; Orthopnea ; Pedal Edema ; Palpitations ; Syncope ; Presyncope ; Paroxysmal nocturnal dyspnea[ ]   Pulmonary: Cough ; Wheezing[ ] ; Hemoptysis[ ] ; Sputum ; Snoring   GI: Vomiting[ ] ; Dysphagia[ ] ; Melena[ ] ; Hematochezia ; Heartburn[ ] ; Abdominal pain ; Constipation ; Diarrhea ; BRBPR   GU: Hematuria[ ] ; Dysuria ; Nocturia[ ]   Vascular: Pain in legs with walking ; Pain in feet with lying flat ; Non-healing sores ; Stroke ; TIA ; Slurred speech ;  Neuro: Headaches[ ] ; Vertigo[ ] ; Seizures[ ] ; Paresthesias[ ] ;Blurred vision ; Diplopia ; Vision changes   Ortho/Skin: Arthritis ; Joint pain ; Muscle pain ; Joint swelling ; Back Pain ; Rash   Psych: Depression[ ] ; Anxiety[ ]   Heme: Bleeding problems ; Clotting disorders ; Anemia   Endocrine: Diabetes ; Thyroid dysfunction[ ]    Home Medications Prior to Admission medications   Medication Sig Start Date End Date Taking? Authorizing Provider  acetaminophen (TYLENOL) 500 MG tablet Take 500 mg by mouth every 6 (six) hours as needed for mild pain.   Yes [provider]  allopurinol (ZYLOPRIM) 100 MG tablet Take 100-200 mg by mouth See admin instructions. Take 2 tablets (200 mg) by mouth in the morning & take 1 tablet (100 mg) by mouth at bedtime   Yes [provider]  Calcium Carb-Cholecalciferol (CALCIUM + VITAMIN D3 PO) Take 1 tablet by mouth daily.   Yes [provider]  empagliflozin (JARDIANCE) 10 MG TABS tablet Take 1 tablet (10 mg total) by mouth daily before breakfast. 01/24/22  Yes Laurey Morale, MD  furosemide (LASIX) 40 MG tablet Take 2 tablets (80 mg total) by mouth every morning AND 1 tablet (40 mg total) every evening. 05/25/22 08/23/22 Yes [place], Anderson Malta, FNP  Glucosamine-Chondroitin (OSTEO BI-FLEX REGULAR STRENGTH PO) Take 1 tablet by mouth in the morning.   Yes [provider]  insulin glargine (LANTUS) 100 UNIT/ML Solostar Pen Inject 24 Units into the skin every morning. Use this insulin in place of Tuojeo 05/09/22  Yes Fayette Pho, MD  insulin glargine, 2 Unit Dial, (TOUJEO MAX SOLOSTAR) 300 UNIT/ML Solostar Pen Inject 24 Units into the skin at bedtime.   Yes [provider]  INSULIN SYRINGE .5CC/29G 29G X 1/2" 0.5 ML MISC Use to give yourself daily basal insulin (Lantus or Tuojeo). 05/09/22  Yes Fayette Pho, MD  levothyroxine (SYNTHROID) 88 MCG tablet Take 1 tablet (88 mcg total) by mouth daily before breakfast. 06/19/22  Yes Fayette Pho, MD  loratadine (CLARITIN) 10 MG tablet Take 10 mg by mouth in the morning.   Yes [provider]  metoprolol succinate (TOPROL-XL) 100 MG 24 hr tablet TAKE 1 TABLET EVERY MORNING. TAKE THE 50 MG EVERY  EVENING. Patient taking differently: Take 100 mg by mouth daily. 06/16/22  Yes Laurey Morale, MD  Omega-3 Fatty Acids (FISH OIL) 1200 MG CPDR Take 1,200 mg by mouth in the morning.   Yes [provider]  omeprazole (PRILOSEC) 40 MG capsule TAKE 1 CAPSULE BY MOUTH ONCE DAILY. 04/12/22  Yes Fayette Pho, MD  sacubitril-valsartan (ENTRESTO) 24-26 MG Take 1 tablet by mouth 2 (two) times daily. 01/24/22  Yes Laurey Morale, MD  simvastatin (ZOCOR) 20 MG tablet take 1 tablet every day at 6 IN THE EVENING 06/16/22  Yes Jonelle Sidle, MD  spironolactone (ALDACTONE) 25 MG tablet take 1 tablet by mouth once daily. 06/16/22  Yes Laurey Morale, MD  warfarin  (COUMADIN) 2 MG tablet TAKE 1/2 TO 1 TABLET ONCE A DAY AS DIRECTED BY ANTICOAGULATION. Patient taking differently: Take 1-2 mg by mouth See admin instructions. TAKE 1/2 TO 1 TABLET ONCE A DAY AS DIRECTED BY ANTICOAGULATION. Wednesday take 1 mg, All other days 2 mg 05/12/22  Yes Laurey Morale, MD  nitroGLYCERIN (NITROSTAT) 0.4 MG SL tablet Place 1 tablet (0.4 mg total) under the tongue every 5 (five) minutes as needed for chest pain. 12/15/21 04/26/24  Jacklynn Ganong, FNP    Past Medical History: Past Medical History:  Diagnosis Date   Acute pain of right shoulder 03/22/2019   Acute renal failure 04/15/2012   Last Assessment & Plan:  Superimposed on CK D stage III, Baseline Cr ~2-2.5, remains above baseline, felt to be due to Cardiorenal syndrome, creatinine trending down at time of discharge Negative urine eos and no urinary retention ACE inhibitor discontinued Follows with Dr Julien Girt as outpatient, to have labs drawn 08/15/16 and results sent to [facility] office for review   Anemia 12/29/2016   Atrial fibrillation with RVR    Biventricular ICD (implantable cardioverter-defibrillator) in place    CHF (congestive heart failure)    CKD (chronic kidney disease), stage IV    Colitis 09/11/2012   Complete heart block    Essential hypertension    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 Physical exam revealed cerumen impaction of left ear canal. Relieved by flushing.    Hypomagnesemia 04/10/2016   Hyponatremia 04/10/2016   Last Assessment & Plan:  - improved after tolvaptan 6/29, chiefly driven by CHF, further improvement with ongoing IV Lasix - continue low Na and fluid restriction - compliance with sodium restriction a major barrier to keeping patient out of hospital and compensated, remains resistant to this  philosophy while inpatient - follows with Dr Julien Girt as outpatient, Neph consult not indicated at this ti   ICD (implantable cardioverter-defibrillator) battery depletion 04/21/2014   Localized macular rash 08/11/2016   Last Assessment & Plan:  Seems to trend with heart failure, though she has more findings behind bilateral knees and up into her thigh/groin area than when I last saw her Would use steroid cream for now, consider alternative diagnosis if no improvement   Long-term insulin use in type 2 diabetes 04/10/2016   Last Assessment & Plan:  Hypoglycemia resolved, continue SSI and hypoglycemia protocol   Nonischemic cardiomyopathy    On amiodarone therapy 08/21/2017   Persistent atrial fibrillation    AV node ablation 2019   Restless leg syndrome 03/22/2019   Type 2 diabetes mellitus  Past Surgical History: Past Surgical History:  Procedure Laterality Date   ABLATION     AV NODE ABLATION N/A 08/08/2017   Procedure: AV NODE ABLATION;  Surgeon: Hillis Range, MD;  Location: [facility] INVASIVE CV LAB;  Service: Cardiovascular;  Laterality: N/A;   BREAST BIOPSY     CARDIAC DEFIBRILLATOR PLACEMENT  2005   MDT Viva XT CRT-D BiV ICD implanted by Dr Sharlot Gowda in [place]   CATARACT EXTRACTION Bilateral    CHOLECYSTECTOMY     ICD GENERATOR CHANGEOUT N/A 05/10/2021   Procedure: ICD GENERATOR CHANGEOUT;  Surgeon: Marinus Maw, MD;  Location: [facility] INVASIVE CV LAB;  Service: Cardiovascular;  Laterality: N/A;   RIGHT HEART CATH N/A 04/28/2022   Procedure: RIGHT HEART CATH;  Surgeon: Laurey Morale, MD;  Location: [facility] INVASIVE CV LAB;  Service: Cardiovascular;  Laterality: N/A;   RIGHT HEART CATH AND CORONARY ANGIOGRAPHY N/A 12/17/2020   Procedure: RIGHT HEART CATH AND CORONARY ANGIOGRAPHY;  Surgeon: Laurey Morale, MD;  Location: [facility] INVASIVE CV LAB;  Service: Cardiovascular;  Laterality: N/A;   TEE WITHOUT CARDIOVERSION N/A 12/17/2020   Procedure: TRANSESOPHAGEAL ECHOCARDIOGRAM (TEE);   Surgeon: Laurey Morale, MD;  Location: [facility] ENDOSCOPY;  Service: Cardiovascular;  Laterality: N/A;   THYROIDECTOMY     TOTAL VAGINAL HYSTERECTOMY      Family History:  Family History  Problem Relation Age of Onset   Hip fracture Mother    COPD Father    CAD Sister    Heart attack Sister     Social History: Social History   Socioeconomic History   Marital status: Married    Spouse name: Not on file   Number of children: Not on file   Years of education: Not on file   Highest education level: Not on file  Occupational History   Not on file  Tobacco Use   Smoking status: Never   Smokeless tobacco: Never  Vaping Use   Vaping Use: Never used  Substance and Sexual Activity   Alcohol use: No   Drug use: No   Sexual activity: Not on file  Other Topics Concern   Not on file  Social History Narrative   Not on file   Social Determinants of Health   Financial Resource Strain: Not on file  Food Insecurity: Not on file  Transportation Needs: Not on file  Physical Activity: Not on file  Stress: Not on file  Social Connections: Not on file    Allergies:  Allergies  Allergen Reactions   Amiodarone Rash   Codeine Other (See Comments)    Euphoria, hallucinations   Other Palpitations    PT STATES SHE GETS REALLY HOT , DIZZY HEADED, HAS PASSED OUT TWICE , AND ITCHING WITH IV DYE   Turmeric     Pt went into AFIB    Colchicine Rash   Iodine Rash    topical   Ivp Dye [Iodinated Contrast Media] Rash   Mexitil [Mexiletine] Rash    Objective:    Vital Signs:       There were no vitals filed for this visit.   Physical Exam     General:  Well appearing. No respiratory difficulty HEENT: Normal Neck: Supple. 2/6 SEM RUSB. Carotids 2+ bilat; no bruits. No lymphadenopathy or thyromegaly appreciated. Cor: PMI nondisplaced. Regular rate & rhythm. No rubs, gallops or murmurs. Lungs: Clear Abdomen: Soft, nontender, nondistended. No hepatosplenomegaly. No bruits or masses.  Good bowel sounds. Extremities: No cyanosis, clubbing, rash, edema Neuro:  Alert & oriented x 3, cranial nerves grossly intact. moves all 4 extremities w/o difficulty. Affect pleasant.   Telemetry   80, NSR   Labs     Basic Metabolic Panel: Recent Labs  Lab 06/23/22 1311  NA 145  K 3.1*  CL 111  GLUCOSE 119*  BUN 30*  CREATININE 1.30*    Liver Function Tests: No results for input(s): "AST", "ALT", "ALKPHOS", "BILITOT", "PROT", "ALBUMIN" in the last 168 hours. No results for input(s): "LIPASE", "AMYLASE" in the last 168 hours. No results for input(s): "AMMONIA" in the last 168 hours.  CBC: Recent Labs  Lab 06/23/22 1311  HGB 9.5*  HCT 28.0*    Cardiac Enzymes: No results for input(s): "CKTOTAL", "CKMB", "CKMBINDEX", "TROPONINI" in the last 168 hours.  BNP: BNP (last 3 results) Recent Labs    01/14/22 1539 04/25/22 1138 05/18/22 1000  BNP 256.5* 667.1* 354.0*    ProBNP (last 3 results) No results for input(s): "PROBNP" in the last 8760 hours.   CBG: No results for input(s): "GLUCAP" in the last 168 hours.  Coagulation Studies: Recent Labs    06/21/22 1050  INR 2.0    Imaging: No results found.  Assessment/Plan   Plan for TEE today to assess aortic stenosis and ASD.    Marca Ancona, MD 06/23/2022, 1:31 PM  Advanced Heart Failure Team Pager [phone] (M-F; 7a - 5p)  Please contact [facility] for night-coverage after hours (4p -7a ) and weekends on [email or web address]

## 2022-06-23 NOTE — Progress Notes (Signed)
  Echocardiogram Echocardiogram Transesophageal has been performed.  Emily [name] 06/23/2022, 2:16 PM

## 2022-06-23 NOTE — Anesthesia Preprocedure Evaluation (Addendum)
Anesthesia Evaluation  Patient identified by MRN, date of birth, ID band Patient awake    Reviewed: Allergy & Precautions, NPO status , Patient's Chart, lab work & pertinent test results  History of Anesthesia Complications Negative for: history of anesthetic complications  Airway Mallampati: II  TM Distance: >3 FB Neck ROM: Full    Dental no notable dental hx. (+) Dental Advisory Given   Pulmonary  Covid-19 Nucleic Acid Test Results No results found for: SARSCOV2NAA, SARSCOV2    Pulmonary exam normal        Cardiovascular hypertension, + CAD and +CHF  Normal cardiovascular exam+ dysrhythmias Atrial Fibrillation + Cardiac Defibrillator + Valvular Problems/Murmurs AS and MR   1. Left ventricular ejection fraction, by estimation, is 25 to 30%. The  left ventricle has severely decreased function. The left ventricle  demonstrates global hypokinesis. The left ventricular internal cavity size  was severely dilated. Left ventricular  diastolic parameters are indeterminate.  2. Pacing wires in RA/RV . Right ventricular systolic function is normal.  The right ventricular size is normal. There is normal pulmonary artery  systolic pressure.  3. Left atrial size was mildly dilated.  4. Fairly large left to right sund across secundum area of septum by  color flow . Agitated saline contrast bubble study was positive with  shunting observed within 3-6 cardiac cycles suggestive of interatrial  shunt.  5. Right atrial size was moderately dilated.  6. The mitral valve is abnormal. Mild to moderate mitral valve  regurgitation. No evidence of mitral stenosis. Moderate mitral annular  calcification.  7. Tricuspid valve regurgitation is moderate.  8. Gradient similar to prior echo Likely moderate to severe low flow AS.  DVI 0.3 AVA 0.83 Morphologically valve is heavily calcified with  restricted leaflet motion . The aortic valve is  calcified. There is severe  calcifcation of the aortic valve. There  is severe thickening of the aortic valve. Aortic valve regurgitation is  trivial. Moderate to severe aortic valve stenosis.  9. The inferior vena cava is dilated in size with <50% respiratory  variability, suggesting right atrial pressure of 15 mmHg.   Neuro/Psych  Headaches  negative psych ROS   GI/Hepatic Neg liver ROS,GERD  Medicated,,  Endo/Other  diabetesHypothyroidism    Renal/GU CRFRenal diseaseLab Results      Component                Value               Date                      CREATININE               2.07 (H)            12/17/2020           Lab Results      Component                Value               Date                      K                        3.2 (L)             12/17/2020  Musculoskeletal   Abdominal   Peds  Hematology Lab Results      Component                Value               Date                      WBC                      8.8                 12/07/2020                HGB                      12.8                12/07/2020                HCT                      38.6                12/07/2020                MCV                      99.0                12/07/2020                PLT                      222                 12/07/2020              Anesthesia Other Findings   Reproductive/Obstetrics                             Anesthesia Physical Anesthesia Plan  ASA: 3  Anesthesia Plan: MAC   Post-op Pain Management:    Induction: Intravenous  PONV Risk Score and Plan: 2 and Propofol infusion and Treatment may vary due to age or medical condition  Airway Management Planned: Nasal Cannula  Additional Equipment: None  Intra-op Plan:   Post-operative Plan:   Informed Consent: I have reviewed the patients History and Physical, chart, labs and discussed the procedure including the risks, benefits and alternatives for the  proposed anesthesia with the patient or authorized representative who has indicated his/her understanding and acceptance.     Dental advisory given  Plan Discussed with: Anesthesiologist and CRNA  Anesthesia Plan Comments:         Anesthesia Quick Evaluation

## 2022-06-23 NOTE — Progress Notes (Signed)
Patient aldrete score is 10

## 2022-06-24 ENCOUNTER — Encounter (HOSPITAL_COMMUNITY): Payer: Self-pay | Admitting: Cardiology

## 2022-06-24 NOTE — Anesthesia Postprocedure Evaluation (Signed)
Anesthesia Post Note  Patient: Emily [name]  Procedure(s) Performed: TRANSESOPHAGEAL ECHOCARDIOGRAM     Patient location during evaluation: PACU Anesthesia Type: MAC Level of consciousness: awake and alert Pain management: pain level controlled Vital Signs Assessment: post-procedure vital signs reviewed and stable Respiratory status: spontaneous breathing and respiratory function stable Cardiovascular status: stable Postop Assessment: no apparent nausea or vomiting Anesthetic complications: no   No notable events documented.  Last Vitals:  Vitals:   06/23/22 1430 06/23/22 1435  BP: 124/63 137/65  Pulse: 81 83  Resp: 18 16  Temp: 36.7 C   SpO2: 100% 98%    Last Pain:  Vitals:   06/23/22 1430  TempSrc: Temporal  PainSc:                  Bladen Umar DANIEL

## 2022-06-27 ENCOUNTER — Other Ambulatory Visit (HOSPITAL_COMMUNITY): Payer: Self-pay | Admitting: Cardiology

## 2022-06-28 ENCOUNTER — Telehealth: Payer: Self-pay

## 2022-06-28 ENCOUNTER — Other Ambulatory Visit (HOSPITAL_COMMUNITY): Payer: Self-pay

## 2022-06-28 ENCOUNTER — Telehealth (HOSPITAL_COMMUNITY): Payer: Self-pay

## 2022-06-28 DIAGNOSIS — I5022 Chronic systolic (congestive) heart failure: Secondary | ICD-10-CM

## 2022-06-28 NOTE — Telephone Encounter (Signed)
The patient is scheduled for RHC 07/14/2022 with Dr. Shirlee Latch 07/15/2022. Per Dr. Lynnette Caffey and Dr. Shirlee Latch, scheduled her for mTEER consult 07/22/2022.  The patient was grateful for call and agreed with plan.  If she proceeds, she will have a PASCAL implant per Dr. Lynnette Caffey.

## 2022-06-28 NOTE — Telephone Encounter (Signed)
Edwards PASCAL review of 06/23/2022 TEE:

## 2022-06-28 NOTE — Telephone Encounter (Signed)
Called patient to schedule RHC. Cath scheduled for 07/15/22, instructions explained to patient and were repeated back. Letter with instructions also mailed to patient

## 2022-06-29 ENCOUNTER — Other Ambulatory Visit (HOSPITAL_COMMUNITY): Payer: Self-pay

## 2022-06-29 MED ORDER — SPIRONOLACTONE 25 MG PO TABS: 25.0000 mg | ORAL_TABLET | Freq: Every day | ORAL | 3 refills | Status: DC

## 2022-07-01 ENCOUNTER — Other Ambulatory Visit: Payer: Self-pay

## 2022-07-01 MED ORDER — TOUJEO MAX SOLOSTAR 300 UNIT/ML ~~LOC~~ SOPN: 24.0000 [IU] | PEN_INJECTOR | Freq: Every day | SUBCUTANEOUS | 2 refills | Status: DC

## 2022-07-06 ENCOUNTER — Telehealth (HOSPITAL_COMMUNITY): Payer: Self-pay

## 2022-07-06 ENCOUNTER — Ambulatory Visit (HOSPITAL_COMMUNITY)
Admission: RE | Admit: 2022-07-06 | Discharge: 2022-07-06 | Disposition: A | Discharge status: Home / Self Care | Payer: PPO | Source: Ambulatory Visit | Attending: Internal Medicine | Admitting: Internal Medicine

## 2022-07-06 ENCOUNTER — Encounter (HOSPITAL_COMMUNITY): Payer: Self-pay

## 2022-07-06 VITALS — BP 102/66 | HR 93 | Wt 123.8 lb

## 2022-07-06 DIAGNOSIS — I330 Acute and subacute infective endocarditis: Secondary | ICD-10-CM

## 2022-07-06 DIAGNOSIS — I5022 Chronic systolic (congestive) heart failure: Secondary | ICD-10-CM

## 2022-07-06 DIAGNOSIS — Q211 Atrial septal defect, unspecified: Secondary | ICD-10-CM | POA: Insufficient documentation

## 2022-07-06 DIAGNOSIS — I428 Other cardiomyopathies: Secondary | ICD-10-CM | POA: Diagnosis not present

## 2022-07-06 DIAGNOSIS — Z95810 Presence of automatic (implantable) cardiac defibrillator: Secondary | ICD-10-CM | POA: Diagnosis not present

## 2022-07-06 DIAGNOSIS — I2510 Atherosclerotic heart disease of native coronary artery without angina pectoris: Secondary | ICD-10-CM | POA: Diagnosis not present

## 2022-07-06 DIAGNOSIS — Z7901 Long term (current) use of anticoagulants: Secondary | ICD-10-CM | POA: Insufficient documentation

## 2022-07-06 DIAGNOSIS — I350 Nonrheumatic aortic (valve) stenosis: Secondary | ICD-10-CM

## 2022-07-06 DIAGNOSIS — E1122 Type 2 diabetes mellitus with diabetic chronic kidney disease: Secondary | ICD-10-CM | POA: Diagnosis not present

## 2022-07-06 DIAGNOSIS — I080 Rheumatic disorders of both mitral and aortic valves: Secondary | ICD-10-CM | POA: Diagnosis not present

## 2022-07-06 DIAGNOSIS — Z794 Long term (current) use of insulin: Secondary | ICD-10-CM | POA: Insufficient documentation

## 2022-07-06 DIAGNOSIS — Z79899 Other long term (current) drug therapy: Secondary | ICD-10-CM | POA: Insufficient documentation

## 2022-07-06 DIAGNOSIS — M109 Gout, unspecified: Secondary | ICD-10-CM | POA: Insufficient documentation

## 2022-07-06 DIAGNOSIS — Z7984 Long term (current) use of oral hypoglycemic drugs: Secondary | ICD-10-CM | POA: Diagnosis not present

## 2022-07-06 DIAGNOSIS — I493 Ventricular premature depolarization: Secondary | ICD-10-CM | POA: Diagnosis not present

## 2022-07-06 DIAGNOSIS — I4821 Permanent atrial fibrillation: Secondary | ICD-10-CM

## 2022-07-06 DIAGNOSIS — N184 Chronic kidney disease, stage 4 (severe): Secondary | ICD-10-CM | POA: Insufficient documentation

## 2022-07-06 DIAGNOSIS — I272 Pulmonary hypertension, unspecified: Secondary | ICD-10-CM | POA: Diagnosis not present

## 2022-07-06 DIAGNOSIS — I340 Nonrheumatic mitral (valve) insufficiency: Secondary | ICD-10-CM

## 2022-07-06 LAB — BASIC METABOLIC PANEL
Anion gap: 16 — ABNORMAL HIGH (ref 5–15)
BUN: 32 mg/dL — ABNORMAL HIGH (ref 8–23)
CO2: 24 mmol/L (ref 22–32)
Calcium: 8.6 mg/dL — ABNORMAL LOW (ref 8.9–10.3)
Chloride: 94 mmol/L — ABNORMAL LOW (ref 98–111)
Creatinine, Ser: 2.08 mg/dL — ABNORMAL HIGH (ref 0.44–1.00)
GFR, Estimated: 25 mL/min — ABNORMAL LOW (ref >60)
Glucose, Bld: 262 mg/dL — ABNORMAL HIGH (ref 70–99)
Potassium: 3.6 mmol/L (ref 3.5–5.1)
Sodium: 134 mmol/L — ABNORMAL LOW (ref 135–145)

## 2022-07-06 NOTE — Telephone Encounter (Addendum)
Pt aware, agreeable, and verbalized understanding  Labs can be drawn on day of cath  05/17  ----- Message from Alen Bleacher, NP sent at 07/06/2022  2:34 PM EDT ----- Renal function elevated. Hold Entresto, spiro and lasix for 3 days. Then resume at same doses  Repeat BMET 1 week.

## 2022-07-06 NOTE — H&P (View-Only) (Signed)
PCP: Lynn, Catherine, MD Cardiology: Dr. McDowell HF Cardiology: Dr. McLean EP: Dr. Taylor   71 y.o. with history of CKD stage IV, permanent atrial fibrillation, and a long-standing nonischemic cardiomyopathy was referred by Dr. McDowell for CHF clinic evaluation.  She first developed cardiac problems in 1988.  At that time, she was admitted with CHF and eventually underwent myocardial biopsy at [facility], per her report showing myocarditis.  Patient states that her EF has been low since then.  She had MDT CRT-D device implanted.  Echo in 7/21 showed EF 20-25%, moderate MR, mild AS. She is now in permanent atrial fibrillation and is s/p AV nodal ablation with BiV pacing. Elevated creatinine has limited medication management.    Echo in 10/22 showed EF 25-30%, severe LV dilation, RV normal, moderate TR, mod-severe AS with mean gradient 9 mmHg but AVA 0.76 cm^2 (?low gradient severe AS), mild-moderate MR, left=>right shunt possible ASD.  TEE done in 10/22 to more closely assess AS.  This showed EF 30%, moderate LV dilation, normal RV, possible calcified vegetation on the ICD lead, small secundum ASD, peak RV-RA gradient 23 mmHg, low flow/low gradient moderate AS, mild-moderate MR. RHC/LHC was done, showing mild nonobstructive CAD, left to right atrial level shunt with Qp/Qs 1.43.  I reviewed TEE and cath report with Dr. Cooper.  We decided that AS was not severe enough yet for TAVR and that the ASD was not significant enough to close percutaneously. With possible calcified vegetation on ICD, we drew blood cultures which were negative.  She saw ID and had a PET-CT, which did not suggest active endocarditis.   Patient was found on Zio patch in 10/22 to have 12.6% PVCs, this was thought to be limiting her BiV pacing.  She has a history of intolerance of amiodarone so was started on mexiletine.  She says that this caused a rash so she stopped it and rash resolved. She also decreased Toprol XL to 100 mg daily due to  nightmares when taking 150 mg daily.  She subsequently has decreased Toprol XL to 50 mg daily.   Echo in 10/23 showed EF 25-30%, severe LV dilation, normal RV, mild MR, moderate AS with AVA 1.1 cm^2, mean gradient 9 mmHg (low flow low gradient moderate AS).   Follow up 2/24, volume stable but worse NYHA III symptoms with recent syncope concerning for orthostasis.  Zio 7-day (2/24): 7 runs of VT, continuous atrial fibrillation  RHC (2/24) showed elevated PCWP with prominent V-waves, mildly elevated RA pressure, preserved CO, moderate pulmonary venous hypertension, small ASD. Echo showed EF 30-35%, likely low flow/low gradient aortic stenosis, but cannot fully rule out severe AS.   Follow up with Dr. Taylor 05/10/22, with increased PVC burden, her pacing rate was increased from 60-80 and PVC improved.   TEE 4/25: LVEF 30-35%, LV w/ RWA, RV mildly reduced, "smoke" noted in LAA, RA mildly dilated, small secund ASD, 0.45 cm in diameter, TR mild-mod. Mild-mod aortic valve stenosis. AV mean gradient 11mmHg with AVA 1.93 cm^2  Today she returns for AHF follow up. Overall feeling well. Denies palpitations, CP, edema, or PND/Orthopnea. Gets "swimmy headed" in the mornings if she gets up too fast, resolves quickly with rest. Chronically SOB with activity, none out of the ordinary for her. Appetite ok. No fever or chills. Able to do house work fine. Weight at home 122 pounds. Taking all medications.    ECG (personally reviewed): none ordered today.  Device interrogation (personally reviewed): Optivol low, TI stable, No   AT/AF, 0.9 hr/ day activity, VP 92.7%  Labs (1/21): LDL 118 Labs (9/21): K 4.4, creatinine 1.93 Labs (12/21): K 4, creatinine 1.77 Labs (2/22): TSH normal Labs (5/22): K 5, creatinine 2.42 Labs (9/22): K 4.3, creatinine 1.73, LDL 90 Labs (10/22): K 4.2, creatinine 2.18 Labs (2/23): K 3.8, creatinine 2.42 Labs (11/23): K 3.6, creatinine 2.22 Labs (2/24): K 3.9, creatinine 2.43 Labs  (3/24): K 3.6, SCr 2.01 Labs (4/24): K 3.1, SCr 1.3  PMH: 1. Gout 2. Complete heart block: Medtronic CRT device.  3. CKD stage IV: Diabetic nephropathy.  4. Type 2 diabetes 5. Atrial fibrillation: Now permanent.   - AF ablation 4/18.  - AV nodal ablation in 2019 with MDT CRT-D.  6. Remote LV thrombus 7. Chronic systolic CHF: Nonischemic cardiomyopathy. Medtronic CRT-D device.  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  - LHC 2004: Nonobstructive mild CAD.  - Echo (8/19): EF 10-15%, mildly dilated LV, mild-moderately dilated RV with mild-moderately decreased systolic function.  - Echo (7/21): EF 20-25%, low normal RV function, moderate MR, mild AS.  - Echo (10/22): EF 25-30%, severe LV dilation, RV normal, moderate TR, mod-severe AS with mean gradient 9 mmHg but AVA 0.76 cm^2 (?low gradient severe AS), mild-moderate MR, left=>right shunt possible ASD.  - Echo (10/23): EF 25-30%, severe LV dilation, normal RV, mild MR, moderate AS with AVA 1.1 cm^2, mean gradient 9 mmHg (low flow low gradient moderate AS). ASD noted.  - Echo (2/24): EF 30-35% (on Dr. McLean's read), mildly dilated LV, normal RV, moderate MR, moderate AS with AVA  0.85 cm , mean gradient 13.5 mmHg (moderate low flow/low gradient aortic stenosis) - RHC/LHC (10/22): mild nonobstructive CAD, left to right atrial level shunt with Qp/Qs 1.43.  - RHC (2/24): RA mean 8, PA 52/21 (mean 31, PCPW mean 20 with prominent v-waves to 39, CO/CI (Fick) 4.92/3.09, PVR 2.2, Qp/Qs 1.29=>suggesting small ASD - TEE 4/24: LVEF 30-35%, LV w/ RWA, RV mildly reduced, "smoke" noted in LAA, RA mildly dilated, small secund ASD, 0.45 cm in diameter, TR mild-mod. Mild-mod aortic valve stenosis. AV mean gradient 11mmHg with AVA 1.93 cm^2 8. H/o cholecystectomy 9. Hypothyroidism 10. 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 Aortic stenosis: TEE (10/22) showed EF 30%, moderate LV dilation, normal RV, possible calcified  vegetation on the ICD lead, small secundum ASD, peak RV-RA gradient 23 mmHg, low flow/low gradient moderate AS, mild-moderate MR.  - Echo in 10/23 confirmed moderate low flow/low gradient AS.  - Echo (2/24) probably moderate low flow/low gradient - Echo (4/24) AV mean gradient 11mmHg with AVA 1.93 cm^2 13. 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  14. 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  - RHC in 10/22 showed atrial level shunt with Qp/Qs 1.43.  - RHC 2/24 showed Qp/Qs  1.29 - TEE 4/24: small secund ASD, 0.45 cm in diameter 15. PVCs: Zio monitor (10/22) with 12.6% PVCs. - Intolerant of both amiodarone and mexiletine.   SH: Married, lives in [place].  No smoking or ETOH.   Family History  Problem Relation Age of Onset   Hip fracture Mother    COPD Father    CAD Sister    Heart attack Sister    ROS: All systems reviewed and negative except as per HPI.   Current Outpatient Medications  Medication Sig Dispense Refill   acetaminophen (TYLENOL) 500 MG tablet Take 500 mg by mouth every 6 (six)   hours as needed for mild pain.     allopurinol (ZYLOPRIM) 100 MG tablet Take 100-200 mg by mouth See admin instructions. Take 2 tablets (200 mg) by mouth in the morning & take 1 tablet (100 mg) by mouth at bedtime     Calcium Carb-Cholecalciferol (CALCIUM + VITAMIN D3 PO) Take 1 tablet by mouth daily.     empagliflozin (JARDIANCE) 10 MG TABS tablet Take 1 tablet (10 mg total) by mouth daily before breakfast. 30 tablet 11   furosemide (LASIX) 40 MG tablet Take 2 tablets (80 mg total) by mouth every morning AND 1 tablet (40 mg total) every evening. 270 tablet 2   Glucosamine-Chondroitin (OSTEO BI-FLEX REGULAR STRENGTH PO) Take 1 tablet by mouth in the morning.     insulin glargine (LANTUS) 100 UNIT/ML Solostar Pen Inject 24 Units into the skin every morning. Use this insulin in place of Tuojeo 15 mL 5   insulin glargine, 2 Unit  Dial, (TOUJEO MAX SOLOSTAR) 300 UNIT/ML Solostar Pen Inject 24 Units into the skin at bedtime. 9 mL 2   INSULIN SYRINGE .5CC/29G 29G X 1/2" 0.5 ML MISC Use to give yourself daily basal insulin (Lantus or Tuojeo). 100 each 6   levothyroxine (SYNTHROID) 88 MCG tablet Take 1 tablet (88 mcg total) by mouth daily before breakfast. 90 tablet 3   loratadine (CLARITIN) 10 MG tablet Take 10 mg by mouth in the morning.     metoprolol succinate (TOPROL-XL) 100 MG 24 hr tablet TAKE 1 TABLET EVERY MORNING. TAKE THE 50 MG EVERY EVENING. (Patient taking differently: Take 100 mg by mouth daily.) 30 tablet 0   nitroGLYCERIN (NITROSTAT) 0.4 MG SL tablet Place 1 tablet (0.4 mg total) under the tongue every 5 (five) minutes as needed for chest pain. 90 tablet 3   Omega-3 Fatty Acids (FISH OIL) 1200 MG CPDR Take 1,200 mg by mouth in the morning.     omeprazole (PRILOSEC) 40 MG capsule TAKE 1 CAPSULE BY MOUTH ONCE DAILY. 90 capsule 1   sacubitril-valsartan (ENTRESTO) 24-26 MG Take 1 tablet by mouth 2 (two) times daily. 60 tablet 6   simvastatin (ZOCOR) 20 MG tablet take 1 tablet every day at 6 IN THE EVENING 30 tablet 2   spironolactone (ALDACTONE) 25 MG tablet Take 1 tablet (25 mg total) by mouth daily. 90 tablet 3   warfarin (COUMADIN) 2 MG tablet TAKE 1/2 TO 1 TABLET ONCE A DAY AS DIRECTED BY ANTICOAGULATION. (Patient taking differently: Take 1-2 mg by mouth See admin instructions. TAKE 1/2 TO 1 TABLET ONCE A DAY AS DIRECTED BY ANTICOAGULATION. Wednesday take 1 mg, All other days 2 mg) 40 tablet 5   No current facility-administered medications for this encounter.   BP 102/66   Pulse 93   Wt 56.2 kg (123 lb 12.8 oz)   SpO2 99%   BMI 22.28 kg/m   Wt Readings from Last 3 Encounters:  07/06/22 56.2 kg (123 lb 12.8 oz)  05/18/22 56.3 kg (124 lb 3.2 oz)  05/10/22 57.6 kg (127 lb)   PHYSICAL EXAM: General:  well appearing.  No respiratory difficulty. Walked into clinic.  HEENT: normal Neck: supple. JVD flat.  Carotids 2+ bilat; no bruits. No lymphadenopathy or thyromegaly appreciated. Cor: PMI nondisplaced. Regular rate & rhythm. No rubs, gallops or murmurs. Lungs: clear Abdomen: soft, nontender, nondistended. No hepatosplenomegaly. No bruits or masses. Good bowel sounds. Extremities: no cyanosis, clubbing, rash, edema  Neuro: alert & oriented x 3, cranial nerves grossly intact. moves all 4   extremities w/o difficulty. Affect pleasant.   Assessment/Plan: 1. Chronic systolic CHF: Nonischemic cardiomyopathy.  Low EF for years.  Per patient's report, she had an endomyocardial biopsy at [facility] in 1988 showing myocarditis.  Medication titration has been limited by CKD stage IV. Has MDT CRT-D device, s/p AV nodal ablation with chronic AF. Echo in 7/21 showed EF 20-25%, moderate MR, mild AS.  Echo 12/17/20 showed EF 25-30%, severe LV dilation, RV normal, moderate TR, mod-severe AS with mean gradient 9 mmHg but AVA 0.76 cm^2.  TEE in 10/22 with EF 30%.  LHC in 10/22 with mild nonobstructive CAD.  Echo in 10/23 showed EF 25-30%, severe LV dilation, normal RV, mild MR, moderate AS with AVA 1.1 cm^2, mean gradient 9 mmHg (low flow low gradient moderate AS), small ASD. RHC (2/24) with mildly elevated RA pressure, moderate pulmonary venous hypertension, preserved CO and elevated PCWP with prominent v-waves. Echo 2/24 showed EF 30-35%, likely low flow/low gradient aortic stenosis, but cannot fully rule out severe AS, moderate MR & ASD visualized.   - She continues with NYHA class III symptoms, device interrogation shows Optivol low, TI stable. Her weight is down 1 lb.  - Continue Lasix 80 qam/40 qpm, BMET today - Continue spironolactone 25 mg qhs  - Continue Jardiance 10 mg daily.  - Continue Entresto 24/26 bid. Would not increase with elevated creatinine.  - Continue Toprol XL 100 mg daily, cannot tolerate higher dose due to nightmares   2.  Atrial fibrillation: Permanent.  S/p AV nodal ablation with BiV pacing.   -  Continue warfarin.  INRs followed by [facility].  3. PVCs: Zio 10/22 with 12.6% PVCs. BiV pacing percentage has been up to >95% but recently down to 75%, ?increasing PVCs again. Pacing rate increased at recent EP visit and now BiV pacing percentage up to 90%. - Continue Toprol. - Unable to tolerate amiodarone in the past.  - She had a rash with mexiletine and stopped it.  4. Aortic stenosis: TEE in 10/22 showed low flow/low gradient moderate aortic stenosis.  Dr. McLean discussed with Dr. Cooper, at that point was not felt to need TAVR. Echo in 10/23 with moderate low flow/low gradient AS. Most recent echo 2/24 suggests probably moderate low flow/low gradient AS. Need TEE to further evaluate. We discussed risks/benefits. She is agreeable to proceed. Discussed with Dr. McLean. 5. ASD: Patient has an ASD by echo with L=>R shunting, small.  Qp/Qs by RHC was 1.43. Small on last echo.  - Qp/Qs on RHC 2/24 was 1.29.  - TEE 4/25: LVEF 30-35%, LV w/ RWA, RV mildly reduced, "smoke" noted in LAA, RA mildly dilated, small secund ASD, 0.45 cm in diameter, TR mild-mod. Mild-mod aortic valve stenosis. AV mean gradient 11mmHg with AVA 1.93 cm^2 - Plan for RHC 07/15/22 6. Mitral regurgitation: Moderate on most recent echo.  - Mod to severe on TEE 06/23/22 - Has mTEER consult 07/22/22 with Dr. Thukkani 7. Gout: Stable.  8. Calcified device lead vegetation: Blood cultures negative, PET-CT in 11/22 not suggestive of active endocarditis.   Follow-up 3-4 months with Dr. McLean  Orley Lawry L See Beharry, AGACNP-BC  07/06/2022  [facility] [place] [address] [facility] [place] [address] [phone] (office) [phone] (fax)  

## 2022-07-06 NOTE — Patient Instructions (Signed)
It was great to see you today! No medication changes are needed at this time.  Labs today We will only contact you if something comes back abnormal or we need to make some changes. Otherwise no news is good news!   Your physician recommends that you schedule a follow-up appointment in: 3-4 months with Dr Shirlee Latch   Do the following things EVERYDAY: Weigh yourself in the morning before breakfast. Write it down and keep it in a log. Take your medicines as prescribed Eat low salt foods--Limit salt (sodium) to 2000 mg per day.  Stay as active as you can everyday Limit all fluids for the day to less than 2 liters  At the [facility], you and your health needs are our priority. As part of our continuing mission to provide you with exceptional heart care, we have created designated Provider Care Teams. These Care Teams include your primary Cardiologist (physician) and Advanced Practice Providers (APPs- Physician Assistants and Nurse Practitioners) who all work together to provide you with the care you need, when you need it.   You may see any of the following providers on your designated Care Team at your next follow up: Dr Arvilla Meres Dr Marca Ancona Dr. Marcos Eke, NP Robbie Lis, [place] [facility] [place], [place] Brynda Peon, NP Karle Plumber, PharmD   Please be sure to bring in all your medications bottles to every appointment.    Thank you for choosing [facility]

## 2022-07-06 NOTE — Progress Notes (Signed)
PCP: Fayette Pho, MD Cardiology: Dr. Diona Browner HF Cardiology: Dr. Shirlee Latch EP: Dr. Ladona Ridgel   72 y.o. with history of CKD stage IV, permanent atrial fibrillation, and a long-standing nonischemic cardiomyopathy was referred by Dr. Diona Browner for CHF clinic evaluation.  She first developed cardiac problems in 1988.  At that time, she was admitted with CHF and eventually underwent myocardial biopsy at [facility], per her report showing myocarditis.  Patient states that her EF has been low since then.  She had MDT CRT-D device implanted.  Echo in 7/21 showed EF 20-25%, moderate MR, mild AS. She is now in permanent atrial fibrillation and is s/p AV nodal ablation with BiV pacing. Elevated creatinine has limited medication management.    Echo in 10/22 showed EF 25-30%, severe LV dilation, RV normal, moderate TR, mod-severe AS with mean gradient 9 mmHg but AVA 0.76 cm^2 (?low gradient severe AS), mild-moderate MR, left=>right shunt possible ASD.  TEE done in 10/22 to more closely assess AS.  This showed EF 30%, moderate LV dilation, normal RV, possible calcified vegetation on the ICD lead, small secundum ASD, peak RV-RA gradient 23 mmHg, low flow/low gradient moderate AS, mild-moderate MR. RHC/LHC was done, showing mild nonobstructive CAD, left to right atrial level shunt with Qp/Qs 1.43.  I reviewed TEE and cath report with Dr. Excell Seltzer.  We decided that AS was not severe enough yet for TAVR and that the ASD was not significant enough to close percutaneously. With possible calcified vegetation on ICD, we drew blood cultures which were negative.  She saw ID and had a PET-CT, which did not suggest active endocarditis.   Patient was found on Zio patch in 10/22 to have 12.6% PVCs, this was thought to be limiting her BiV pacing.  She has a history of intolerance of amiodarone so was started on mexiletine.  She says that this caused a rash so she stopped it and rash resolved. She also decreased Toprol XL to 100 mg daily due to  nightmares when taking 150 mg daily.  She subsequently has decreased Toprol XL to 50 mg daily.   Echo in 10/23 showed EF 25-30%, severe LV dilation, normal RV, mild MR, moderate AS with AVA 1.1 cm^2, mean gradient 9 mmHg (low flow low gradient moderate AS).   Follow up 2/24, volume stable but worse NYHA III symptoms with recent syncope concerning for orthostasis.  Zio 7-day (2/24): 7 runs of VT, continuous atrial fibrillation  RHC (2/24) showed elevated PCWP with prominent V-waves, mildly elevated RA pressure, preserved CO, moderate pulmonary venous hypertension, small ASD. Echo showed EF 30-35%, likely low flow/low gradient aortic stenosis, but cannot fully rule out severe AS.   Follow up with Dr. Ladona Ridgel 05/10/22, with increased PVC burden, her pacing rate was increased from 60-80 and PVC improved.   TEE 4/25: LVEF 30-35%, LV w/ RWA, RV mildly reduced, "smoke" noted in LAA, RA mildly dilated, small secund ASD, 0.45 cm in diameter, TR mild-mod. Mild-mod aortic valve stenosis. AV mean gradient with AVA 1.93 cm^2  Today she returns for AHF follow up. Overall feeling well. Denies palpitations, CP, edema, or PND/Orthopnea. Gets "swimmy headed" in the mornings if she gets up too fast, resolves quickly with rest. Chronically SOB with activity, none out of the ordinary for her. Appetite ok. No fever or chills. Able to do house work fine. Weight at home 122 pounds. Taking all medications.    ECG (personally reviewed): none ordered today.  Device interrogation (personally reviewed): Optivol low, TI stable, No  AT/AF, 0.9 hr/ day activity, VP 92.7%  Labs (1/21): LDL 118 Labs (9/21): K 4.4, creatinine 1.93 Labs (12/21): K 4, creatinine 1.77 Labs (2/22): TSH normal Labs (5/22): K 5, creatinine 2.42 Labs (9/22): K 4.3, creatinine 1.73, LDL 90 Labs (10/22): K 4.2, creatinine 2.18 Labs (2/23): K 3.8, creatinine 2.42 Labs (11/23): K 3.6, creatinine 2.22 Labs (2/24): K 3.9, creatinine 2.43 Labs  (3/24): K 3.6, SCr 2.01 Labs (4/24): K 3.1, SCr 1.3  PMH: 1. Gout 2. Complete heart block: Medtronic CRT device.  3. CKD stage IV: Diabetic nephropathy.  4. Type 2 diabetes 5. Atrial fibrillation: Now permanent.   - AF ablation 4/18.  - AV nodal ablation in 2019 with MDT CRT-D.  6. Remote LV thrombus 7. Chronic systolic CHF: Nonischemic cardiomyopathy. Medtronic CRT-D device.  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  - LHC 2004: Nonobstructive mild CAD.  - Echo (8/19): EF 10-15%, mildly dilated LV, mild-moderately dilated RV with mild-moderately decreased systolic function.  - Echo (7/21): EF 20-25%, low normal RV function, moderate MR, mild AS.  - Echo (10/22): EF 25-30%, severe LV dilation, RV normal, moderate TR, mod-severe AS with mean gradient 9 mmHg but AVA 0.76 cm^2 (?low gradient severe AS), mild-moderate MR, left=>right shunt possible ASD.  - Echo (10/23): EF 25-30%, severe LV dilation, normal RV, mild MR, moderate AS with AVA 1.1 cm^2, mean gradient 9 mmHg (low flow low gradient moderate AS). ASD noted.  - Echo (2/24): EF 30-35% (on Dr. Alford Highland read), mildly dilated LV, normal RV, moderate MR, moderate AS with AVA  0.85 cm , mean gradient 13.5 mmHg (moderate low flow/low gradient aortic stenosis) - RHC/LHC (10/22): mild nonobstructive CAD, left to right atrial level shunt with Qp/Qs 1.43.  - RHC (2/24): RA mean 8, PA 52/21 (mean 31, PCPW mean 20 with prominent v-waves to 39, CO/CI (Fick) 4.92/3.09, PVR 2.2, Qp/Qs 1.29=>suggesting small ASD - TEE 4/24: LVEF 30-35%, LV w/ RWA, RV mildly reduced, "smoke" noted in LAA, RA mildly dilated, small secund ASD, 0.45 cm in diameter, TR mild-mod. Mild-mod aortic valve stenosis. AV mean gradient with AVA 1.93 cm^2 8. H/o cholecystectomy 9. Hypothyroidism 10. 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 Aortic stenosis: TEE (10/22) showed EF 30%, moderate LV dilation, normal RV, possible calcified  vegetation on the ICD lead, small secundum ASD, peak RV-RA gradient 23 mmHg, low flow/low gradient moderate AS, mild-moderate MR.  - Echo in 10/23 confirmed moderate low flow/low gradient AS.  - Echo (2/24) probably moderate low flow/low gradient - Echo (4/24) AV mean gradient with AVA 1.93 cm^2 13. 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  14. 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  - RHC in 10/22 showed atrial level shunt with Qp/Qs 1.43.  - RHC 2/24 showed Qp/Qs  1.29 - TEE 4/24: small secund ASD, 0.45 cm in diameter 15. PVCs: Zio monitor (10/22) with 12.6% PVCs. - Intolerant of both amiodarone and mexiletine.   SH: Married, lives in [place].  No smoking or ETOH.   Family History  Problem Relation Age of Onset   Hip fracture Mother    COPD Father    CAD Sister    Heart attack Sister    ROS: All systems reviewed and negative except as per HPI.   Current Outpatient Medications  Medication Sig Dispense Refill   acetaminophen (TYLENOL) 500 MG tablet Take 500 mg by mouth every 6 (six)  hours as needed for mild pain.     allopurinol (ZYLOPRIM) 100 MG tablet Take 100-200 mg by mouth See admin instructions. Take 2 tablets (200 mg) by mouth in the morning & take 1 tablet (100 mg) by mouth at bedtime     Calcium Carb-Cholecalciferol (CALCIUM + VITAMIN D3 PO) Take 1 tablet by mouth daily.     empagliflozin (JARDIANCE) 10 MG TABS tablet Take 1 tablet (10 mg total) by mouth daily before breakfast. 30 tablet 11   furosemide (LASIX) 40 MG tablet Take 2 tablets (80 mg total) by mouth every morning AND 1 tablet (40 mg total) every evening. 270 tablet 2   Glucosamine-Chondroitin (OSTEO BI-FLEX REGULAR STRENGTH PO) Take 1 tablet by mouth in the morning.     insulin glargine (LANTUS) 100 UNIT/ML Solostar Pen Inject 24 Units into the skin every morning. Use this insulin in place of Tuojeo 15 mL 5   insulin glargine, 2 Unit  Dial, (TOUJEO MAX SOLOSTAR) 300 UNIT/ML Solostar Pen Inject 24 Units into the skin at bedtime. 9 mL 2   INSULIN SYRINGE .5CC/29G 29G X 1/2" 0.5 ML MISC Use to give yourself daily basal insulin (Lantus or Tuojeo). 100 each 6   levothyroxine (SYNTHROID) 88 MCG tablet Take 1 tablet (88 mcg total) by mouth daily before breakfast. 90 tablet 3   loratadine (CLARITIN) 10 MG tablet Take 10 mg by mouth in the morning.     metoprolol succinate (TOPROL-XL) 100 MG 24 hr tablet TAKE 1 TABLET EVERY MORNING. TAKE THE 50 MG EVERY EVENING. (Patient taking differently: Take 100 mg by mouth daily.) 30 tablet 0   nitroGLYCERIN (NITROSTAT) 0.4 MG SL tablet Place 1 tablet (0.4 mg total) under the tongue every 5 (five) minutes as needed for chest pain. 90 tablet 3   Omega-3 Fatty Acids (FISH OIL) 1200 MG CPDR Take 1,200 mg by mouth in the morning.     omeprazole (PRILOSEC) 40 MG capsule TAKE 1 CAPSULE BY MOUTH ONCE DAILY. 90 capsule 1   sacubitril-valsartan (ENTRESTO) 24-26 MG Take 1 tablet by mouth 2 (two) times daily. 60 tablet 6   simvastatin (ZOCOR) 20 MG tablet take 1 tablet every day at 6 IN THE EVENING 30 tablet 2   spironolactone (ALDACTONE) 25 MG tablet Take 1 tablet (25 mg total) by mouth daily. 90 tablet 3   warfarin (COUMADIN) 2 MG tablet TAKE 1/2 TO 1 TABLET ONCE A DAY AS DIRECTED BY ANTICOAGULATION. (Patient taking differently: Take 1-2 mg by mouth See admin instructions. TAKE 1/2 TO 1 TABLET ONCE A DAY AS DIRECTED BY ANTICOAGULATION. Wednesday take 1 mg, All other days 2 mg) 40 tablet 5   No current facility-administered medications for this encounter.   BP 102/66   Pulse 93   Wt 56.2 kg (123 lb 12.8 oz)   SpO2 99%   BMI 22.28 kg/m   Wt Readings from Last 3 Encounters:  07/06/22 56.2 kg (123 lb 12.8 oz)  05/18/22 56.3 kg (124 lb 3.2 oz)  05/10/22 57.6 kg (127 lb)   PHYSICAL EXAM: General:  well appearing.  No respiratory difficulty. Walked into clinic.  HEENT: normal Neck: supple. JVD flat.  Carotids 2+ bilat; no bruits. No lymphadenopathy or thyromegaly appreciated. Cor: PMI nondisplaced. Regular rate & rhythm. No rubs, gallops or murmurs. Lungs: clear Abdomen: soft, nontender, nondistended. No hepatosplenomegaly. No bruits or masses. Good bowel sounds. Extremities: no cyanosis, clubbing, rash, edema  Neuro: alert & oriented x 3, cranial nerves grossly intact. moves all 4  extremities w/o difficulty. Affect pleasant.   Assessment/Plan: 1. Chronic systolic CHF: Nonischemic cardiomyopathy.  Low EF for years.  Per patient's report, she had an endomyocardial biopsy at [facility] in 1988 showing myocarditis.  Medication titration has been limited by CKD stage IV. Has MDT CRT-D device, s/p AV nodal ablation with chronic AF. Echo in 7/21 showed EF 20-25%, moderate MR, mild AS.  Echo 12/17/20 showed EF 25-30%, severe LV dilation, RV normal, moderate TR, mod-severe AS with mean gradient 9 mmHg but AVA 0.76 cm^2.  TEE in 10/22 with EF 30%.  LHC in 10/22 with mild nonobstructive CAD.  Echo in 10/23 showed EF 25-30%, severe LV dilation, normal RV, mild MR, moderate AS with AVA 1.1 cm^2, mean gradient 9 mmHg (low flow low gradient moderate AS), small ASD. RHC (2/24) with mildly elevated RA pressure, moderate pulmonary venous hypertension, preserved CO and elevated PCWP with prominent v-waves. Echo 2/24 showed EF 30-35%, likely low flow/low gradient aortic stenosis, but cannot fully rule out severe AS, moderate MR & ASD visualized.   - She continues with NYHA class III symptoms, device interrogation shows Optivol low, TI stable. Her weight is down 1 lb.  - Continue Lasix 80 qam/40 qpm, BMET today - Continue spironolactone 25 mg qhs  - Continue Jardiance 10 mg daily.  - Continue Entresto 24/26 bid. Would not increase with elevated creatinine.  - Continue Toprol XL 100 mg daily, cannot tolerate higher dose due to nightmares   2.  Atrial fibrillation: Permanent.  S/p AV nodal ablation with BiV pacing.   -  Continue warfarin.  INRs followed by [facility].  3. PVCs: Zio 10/22 with 12.6% PVCs. BiV pacing percentage has been up to >95% but recently down to 75%, ?increasing PVCs again. Pacing rate increased at recent EP visit and now BiV pacing percentage up to 90%. - Continue Toprol. - Unable to tolerate amiodarone in the past.  - She had a rash with mexiletine and stopped it.  4. Aortic stenosis: TEE in 10/22 showed low flow/low gradient moderate aortic stenosis.  Dr. Shirlee Latch discussed with Dr. Excell Seltzer, at that point was not felt to need TAVR. Echo in 10/23 with moderate low flow/low gradient AS. Most recent echo 2/24 suggests probably moderate low flow/low gradient AS. Need TEE to further evaluate. We discussed risks/benefits. She is agreeable to proceed. Discussed with Dr. Shirlee Latch. 5. ASD: Patient has an ASD by echo with L=>R shunting, small.  Qp/Qs by RHC was 1.43. Small on last echo.  - Qp/Qs on RHC 2/24 was 1.29.  - TEE 4/25: LVEF 30-35%, LV w/ RWA, RV mildly reduced, "smoke" noted in LAA, RA mildly dilated, small secund ASD, 0.45 cm in diameter, TR mild-mod. Mild-mod aortic valve stenosis. AV mean gradient with AVA 1.93 cm^2 - Plan for RHC 07/15/22 6. Mitral regurgitation: Moderate on most recent echo.  - Mod to severe on TEE 06/23/22 - Has mTEER consult 07/22/22 with Dr. Lynnette Caffey 7. Gout: Stable.  8. Calcified device lead vegetation: Blood cultures negative, PET-CT in 11/22 not suggestive of active endocarditis.   Follow-up 3-4 months with Dr. Thressa Sheller, AGACNP-BC  07/06/2022  [facility] [address] [facility] [place] [address] [phone] (office) [phone] (fax)

## 2022-07-11 ENCOUNTER — Ambulatory Visit: Payer: PPO | Attending: Internal Medicine

## 2022-07-11 DIAGNOSIS — Z95810 Presence of automatic (implantable) cardiac defibrillator: Secondary | ICD-10-CM | POA: Diagnosis not present

## 2022-07-11 DIAGNOSIS — I5022 Chronic systolic (congestive) heart failure: Secondary | ICD-10-CM | POA: Diagnosis not present

## 2022-07-13 NOTE — Progress Notes (Signed)
Remote ICD transmission.   

## 2022-07-14 ENCOUNTER — Other Ambulatory Visit (HOSPITAL_COMMUNITY): Payer: Self-pay | Admitting: Cardiology

## 2022-07-14 ENCOUNTER — Other Ambulatory Visit: Payer: Self-pay | Admitting: Family Medicine

## 2022-07-15 ENCOUNTER — Other Ambulatory Visit: Payer: Self-pay

## 2022-07-15 ENCOUNTER — Ambulatory Visit (HOSPITAL_COMMUNITY)
Admission: RE | Admit: 2022-07-15 | Discharge: 2022-07-15 | Disposition: A | Discharge status: Home / Self Care | Payer: PPO | Attending: Cardiology | Admitting: Cardiology

## 2022-07-15 ENCOUNTER — Encounter (HOSPITAL_COMMUNITY)
Admission: RE | Disposition: A | Discharge status: Home / Self Care | Payer: Self-pay | Source: Home / Self Care | Attending: Cardiology

## 2022-07-15 DIAGNOSIS — Z7901 Long term (current) use of anticoagulants: Secondary | ICD-10-CM | POA: Diagnosis not present

## 2022-07-15 DIAGNOSIS — I509 Heart failure, unspecified: Secondary | ICD-10-CM

## 2022-07-15 DIAGNOSIS — E1122 Type 2 diabetes mellitus with diabetic chronic kidney disease: Secondary | ICD-10-CM | POA: Insufficient documentation

## 2022-07-15 DIAGNOSIS — M109 Gout, unspecified: Secondary | ICD-10-CM | POA: Insufficient documentation

## 2022-07-15 DIAGNOSIS — I4821 Permanent atrial fibrillation: Secondary | ICD-10-CM | POA: Insufficient documentation

## 2022-07-15 DIAGNOSIS — N184 Chronic kidney disease, stage 4 (severe): Secondary | ICD-10-CM | POA: Insufficient documentation

## 2022-07-15 DIAGNOSIS — I428 Other cardiomyopathies: Secondary | ICD-10-CM | POA: Insufficient documentation

## 2022-07-15 DIAGNOSIS — Z79899 Other long term (current) drug therapy: Secondary | ICD-10-CM | POA: Insufficient documentation

## 2022-07-15 DIAGNOSIS — I5022 Chronic systolic (congestive) heart failure: Secondary | ICD-10-CM | POA: Insufficient documentation

## 2022-07-15 DIAGNOSIS — Q211 Atrial septal defect, unspecified: Secondary | ICD-10-CM | POA: Diagnosis not present

## 2022-07-15 DIAGNOSIS — Z7984 Long term (current) use of oral hypoglycemic drugs: Secondary | ICD-10-CM | POA: Insufficient documentation

## 2022-07-15 DIAGNOSIS — I340 Nonrheumatic mitral (valve) insufficiency: Secondary | ICD-10-CM

## 2022-07-15 DIAGNOSIS — I080 Rheumatic disorders of both mitral and aortic valves: Secondary | ICD-10-CM | POA: Insufficient documentation

## 2022-07-15 HISTORY — PX: RIGHT HEART CATH: CATH118263

## 2022-07-15 LAB — POCT I-STAT EG7
Acid-Base Excess: 0 mmol/L (ref 0.0–2.0)
Acid-Base Excess: 0 mmol/L (ref 0.0–2.0)
Bicarbonate: 23.3 mmol/L (ref 20.0–28.0)
Bicarbonate: 23 mmol/L (ref 20.0–28.0)
Calcium, Ion: 1.02 mmol/L — ABNORMAL LOW (ref 1.15–1.40)
Calcium, Ion: 1.07 mmol/L — ABNORMAL LOW (ref 1.15–1.40)
HCT: 33 % — ABNORMAL LOW (ref 36.0–46.0)
HCT: 34 % — ABNORMAL LOW (ref 36.0–46.0)
Hemoglobin: 11.2 g/dL — ABNORMAL LOW (ref 12.0–15.0)
Hemoglobin: 11.6 g/dL — ABNORMAL LOW (ref 12.0–15.0)
O2 Saturation: 72 %
O2 Saturation: 73 %
Potassium: 2.7 mmol/L — CL (ref 3.5–5.1)
Potassium: 2.8 mmol/L — ABNORMAL LOW (ref 3.5–5.1)
Sodium: 140 mmol/L (ref 135–145)
Sodium: 139 mmol/L (ref 135–145)
TCO2: 24 mmol/L (ref 22–32)
TCO2: 24 mmol/L (ref 22–32)
pCO2, Ven: 31.2 mmHg — ABNORMAL LOW (ref 44–60)
pCO2, Ven: 30.9 mmHg — ABNORMAL LOW (ref 44–60)
pH, Ven: 7.482 — ABNORMAL HIGH (ref 7.25–7.43)
pH, Ven: 7.48 — ABNORMAL HIGH (ref 7.25–7.43)
pO2, Ven: 35 mmHg (ref 32–45)
pO2, Ven: 35 mmHg (ref 32–45)

## 2022-07-15 LAB — CBC
HCT: 37.6 % (ref 36.0–46.0)
Hemoglobin: 12 g/dL (ref 12.0–15.0)
MCH: 28.5 pg (ref 26.0–34.0)
MCHC: 31.9 g/dL (ref 30.0–36.0)
MCV: 89.3 fL (ref 80.0–100.0)
Platelets: 314 10*3/uL (ref 150–400)
RBC: 4.21 MIL/uL (ref 3.87–5.11)
RDW: 15.4 % (ref 11.5–15.5)
WBC: 12.2 10*3/uL — ABNORMAL HIGH (ref 4.0–10.5)
nRBC: 0 % (ref 0.0–0.2)

## 2022-07-15 LAB — GLUCOSE, CAPILLARY: Glucose-Capillary: 146 mg/dL — ABNORMAL HIGH (ref 70–99)

## 2022-07-15 LAB — PROTIME-INR
INR: 1.4 — ABNORMAL HIGH (ref 0.8–1.2)
Prothrombin Time: 17.8 seconds — ABNORMAL HIGH (ref 11.4–15.2)

## 2022-07-15 SURGERY — RIGHT HEART CATH
Anesthesia: LOCAL

## 2022-07-15 MED ORDER — ONDANSETRON HCL 4 MG/2ML IJ SOLN: 4.0000 mg | Freq: Four times a day (QID) | INTRAMUSCULAR | Status: DC | PRN

## 2022-07-15 MED ORDER — SODIUM CHLORIDE 0.9 % IV SOLN: 250.0000 mL | INTRAVENOUS | Status: DC | PRN

## 2022-07-15 MED ORDER — LABETALOL HCL 5 MG/ML IV SOLN: 10.0000 mg | INTRAVENOUS | Status: DC | PRN

## 2022-07-15 MED ORDER — LIDOCAINE HCL (PF) 1 % IJ SOLN
INTRAMUSCULAR | Status: DC | PRN
  Administered 2022-07-15: 5 mL via INTRADERMAL

## 2022-07-15 MED ORDER — SODIUM CHLORIDE 0.9% FLUSH: 3.0000 mL | INTRAVENOUS | Status: DC | PRN

## 2022-07-15 MED ORDER — SODIUM CHLORIDE 0.9 % IV SOLN: INTRAVENOUS | Status: DC

## 2022-07-15 MED ORDER — HEPARIN (PORCINE) IN NACL 1000-0.9 UT/500ML-% IV SOLN
INTRAVENOUS | Status: DC | PRN
  Administered 2022-07-15: 500 mL

## 2022-07-15 MED ORDER — SODIUM CHLORIDE 0.9% FLUSH: 3.0000 mL | Freq: Two times a day (BID) | INTRAVENOUS | Status: DC

## 2022-07-15 MED ORDER — HYDRALAZINE HCL 20 MG/ML IJ SOLN: 10.0000 mg | INTRAMUSCULAR | Status: DC | PRN

## 2022-07-15 MED ORDER — LIDOCAINE HCL (PF) 1 % IJ SOLN
INTRAMUSCULAR | Status: AC
  Filled 2022-07-15: qty 30

## 2022-07-15 MED ORDER — ACETAMINOPHEN 325 MG PO TABS: 650.0000 mg | ORAL_TABLET | ORAL | Status: DC | PRN

## 2022-07-15 SURGICAL SUPPLY — 7 items
CATH BALLN WEDGE 5F 110CM (CATHETERS) IMPLANT
PACK CARDIAC CATHETERIZATION (CUSTOM PROCEDURE TRAY) ×1 IMPLANT
SHEATH GLIDE SLENDER 4/5FR (SHEATH) IMPLANT
TRANSDUCER W/STOPCOCK (MISCELLANEOUS) ×1 IMPLANT
TUBING ART PRESS 72  MALE/FEM (TUBING) ×1
TUBING ART PRESS 72 MALE/FEM (TUBING) IMPLANT
WIRE EMERALD 3MM-J .025X260CM (WIRE) IMPLANT

## 2022-07-15 NOTE — Progress Notes (Signed)
EPIC Encounter for ICM Monitoring  Patient Name: Emily [name] is a 72 y.o. female Date: 07/15/2022 Primary Care Physican: Fayette Pho, MD Primary Cardiologist: [place] Electrophysiologist: Ladona Ridgel Nephrologist: [facility]-Dr Lacy Duverney  Bi-V Pacing: 89.8%  04/04/2022 Weight:  128 lbs 04/19/2022 Weight: 128 lbs 05/04/2022   Weight: 123 lbs 06/08/2022 Weight: 122-123 lbs    Time in AT/AF  Off Atrial Sensitivity is Off. AT/AF monitoring and PR Logic are disable   Transmission results reviewed.  Cardiac Cath 5/17.         Optivol Thoracic impedance suggesting normal fluid levels since 3/18.   Prescribed: Furosemide 80 mg Take 1 tablet (80 mg total) by mouth every morning and 0.5 tablet (40 mg total) every evening.     Spironolactone 25 mg take 0.5 tablet by mouth daily   Labs: 07/15/2022 Potassium 2.7, Sodium 140  07/06/2022 Creatinine 2.08, BUN 32, Potassium 3.6, Sodium 134, GFR 25  06/23/2022 Creatinine 1.30, BUN 30, Potassium 3.1, Sodium 145  05/18/2022 Creatinine 2.01, BUN 43, Potassium 3.6, Sodium 134, GFR 26 04/19/2022 Creatinine 2.12, BUN 32, Potassium 4.0, Sodium 136, GFR 24 01/24/2022 Creatinine 2.31, BUN 31, Potassium 5.0, Sodium 136, GFR 22 A complete set of results can be found in Results Review.   Recommendations:  No changes.   Follow-up plan: ICM clinic phone appointment on 08/15/2022.  91 day device clinic remote transmission 08/09/2022.      EP/Cardiology Office Visits:  07/22/2022 with Dr Lynnette Caffey.  08/23/2022 with Dr Ladona Ridgel.   10/11/2022 with Dr Shirlee Latch.       Copy of ICM check sent to Dr. Ladona Ridgel  3 month ICM trend: 07/11/2022.    12-14 Month ICM trend:     Karie Soda, RN 07/15/2022 3:38 PM

## 2022-07-15 NOTE — Interval H&P Note (Signed)
History and Physical Interval Note:  07/15/2022 12:58 PM  Emily [name]  has presented today for surgery, with the diagnosis of heart failure - mitral clip workup.  The various methods of treatment have been discussed with the patient and family. After consideration of risks, benefits and other options for treatment, the patient has consented to  Procedure(s): RIGHT HEART CATH (N/A) as a surgical intervention.  The patient's history has been reviewed, patient examined, no change in status, stable for surgery.  I have reviewed the patient's chart and labs.  Questions were answered to the patient's satisfaction.     Lavonta Tillis [employer]

## 2022-07-15 NOTE — Discharge Instructions (Addendum)
Restart warfarin tonight.  Brachial Site Care   This sheet gives you information about how to care for yourself after your procedure. Your health care provider may also give you more specific instructions. If you have problems or questions, contact your health care provider. What can I expect after the procedure? After the procedure, it is common to have: Bruising and tenderness at the catheter insertion area. Follow these instructions at home:  Insertion site care Follow instructions from your health care provider about how to take care of your insertion site. Make sure you: Wash your hands with soap and water before you change your bandage (dressing). If soap and water are not available, use hand sanitizer. Remove your dressing as told by your health care provider. In 24 hours Check your insertion site every day for signs of infection. Check for: Redness, swelling, or pain. Pus or a bad smell. Warmth. You may shower 24-48 hours after the procedure. Do not apply powder or lotion to the site.  Activity For 24 hours after the procedure, or as directed by your health care provider: Do not push or pull heavy objects with the affected arm. Do not drive yourself home from the hospital or clinic. You may drive 24 hours after the procedure unless your health care provider tells you not to. Do not lift anything that is heavier than 10 lb (4.5 kg), or the limit that you are told, until your health care provider says that it is safe.  For 24 hours

## 2022-07-18 ENCOUNTER — Other Ambulatory Visit (HOSPITAL_COMMUNITY): Payer: Self-pay

## 2022-07-18 ENCOUNTER — Encounter (HOSPITAL_COMMUNITY): Payer: Self-pay | Admitting: Cardiology

## 2022-07-19 ENCOUNTER — Ambulatory Visit: Payer: PPO | Attending: Cardiology | Admitting: *Deleted

## 2022-07-19 DIAGNOSIS — I4891 Unspecified atrial fibrillation: Secondary | ICD-10-CM

## 2022-07-19 DIAGNOSIS — I513 Intracardiac thrombosis, not elsewhere classified: Secondary | ICD-10-CM | POA: Diagnosis not present

## 2022-07-19 DIAGNOSIS — I4819 Other persistent atrial fibrillation: Secondary | ICD-10-CM

## 2022-07-19 DIAGNOSIS — Z5181 Encounter for therapeutic drug level monitoring: Secondary | ICD-10-CM

## 2022-07-19 LAB — POCT INR: INR: 1.6 — AB (ref 2.0–3.0)

## 2022-07-19 NOTE — Patient Instructions (Signed)
Take warfarin 1 1/2 tablets tonight, take 1 tablet tomorrow night then resume 1 tablet daily except 1/2 tablet on Wednesdays  Recheck in 2 weeks.

## 2022-07-20 NOTE — Progress Notes (Addendum)
Patient ID: Emily [name] MRN: [id number] DOB/AGE: [birth year] 72 y.o.  Primary Care Physician:Lynn, Santina Evans, MD Primary Cardiologist: Marca Ancona, MD  FOCUSED CARDIOVASCULAR PROBLEM LIST:   1.  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  Permanent atrial fibrillation status post AV node ablation and permanent pacemaker; on warfarin 3.  Type 2 diabetes on insulin 4.  Stage IV chronic kidney disease 5.  Hypertension 6.  Hyperlipidemia 7.  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  Moderate aortic stenosis 9.  Severe functional mitral regurgitation  HISTORY OF PRESENT ILLNESS: The patient is a 72 y.o. female with the indicated medical history here for recommendations regarding her mitral regurgitation.  The patient has been cared for by Dr. Shirlee Latch for some time.  She has longstanding nonischemic cardiomyopathy thought to be due to myocarditis.  She was seen in the heart failure clinic in February and endorsed ongoing shortness of breath affecting her activities of daily living.  She was on optimal medical therapy with a 4 drug regimen including spironolactone, Jardiance, Entresto, and Toprol as well as twice a day Lasix.  She was referred for right heart catheterization which was performed earlier this month.  This demonstrated a mean wedge pressure of 14 mmHg with V waves to 24 mmHg.  Her mean RA pressure was 6 mmHg and her mean PA pressure was 23 mmHg.  Her Fick cardiac output and index were preserved.  The patient tells me that over the last few months she has been increasingly more more easily fatigued and dyspneic.  She is found that activities of daily living such as vacuuming and cooking are taking longer because she has to take frequent breaks due to dyspnea.  She has had longstanding dyspnea since the late 80s when she was diagnosed with myocarditis however its gotten progressively worse recently.  She does report stable two-pillow orthopnea.  She denies  any peripheral edema.  She has had no ICD shocks.  She denies any exertional angina.  She did have a presyncopal episode a couple months ago but none recently.  She is developed no severe bleeding or bruising while on warfarin.  She fortunately has not required any emergency room visits or hospitalizations.  She saw dentist last year.  She tries to minimize this due to financial issues.  At that visit she did need a cavity filled.  She is missing quite a few of her teeth but her remaining are in good condition.    Past Medical History:  Diagnosis Date   Acute pain of right shoulder 03/22/2019   Acute renal failure (HCC) 04/15/2012   Last Assessment & Plan:  Superimposed on CK D stage III, Baseline Cr ~2-2.5, remains above baseline, felt to be due to Cardiorenal syndrome, creatinine trending down at time of discharge Negative urine eos and no urinary retention ACE inhibitor discontinued Follows with Dr Julien Girt as outpatient, to have labs drawn 08/15/16 and results sent to [facility]phrology office for review   Anemia 12/29/2016   Atrial fibrillation with RVR (HCC)    Biventricular ICD (implantable cardioverter-defibrillator) in place    CHF (congestive heart failure) (HCC)    CKD (chronic kidney disease), stage IV (HCC)    Colitis 09/11/2012   Complete heart block (HCC)    Essential hypertension    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 Physical exam revealed cerumen impaction of left ear canal. Relieved by flushing.    Hypomagnesemia 04/10/2016   Hyponatremia 04/10/2016   Last Assessment & Plan:  - improved after tolvaptan 6/29, chiefly driven by CHF, further improvement with ongoing IV Lasix - continue low Na and fluid restriction - compliance with sodium restriction a major barrier to keeping patient out of hospital  and compensated, remains resistant to this philosophy while inpatient - follows with Dr Julien Girt as outpatient, Neph consult not indicated at this ti   ICD (implantable cardioverter-defibrillator) battery depletion 04/21/2014   Localized macular rash 08/11/2016   Last Assessment & Plan:  Seems to trend with heart failure, though she has more findings behind bilateral knees and up into her thigh/groin area than when I last saw her Would use steroid cream for now, consider alternative diagnosis if no improvement   Long-term insulin use in type 2 diabetes (HCC) 04/10/2016   Last Assessment & Plan:  Hypoglycemia resolved, continue SSI and hypoglycemia protocol   Nonischemic cardiomyopathy (HCC)    On amiodarone therapy 08/21/2017   Persistent atrial fibrillation (HCC)    AV node ablation 2019   Restless leg syndrome 03/22/2019   Type 2 diabetes mellitus (HCC)     Past Surgical History:  Procedure Laterality Date   ABLATION     AV NODE ABLATION N/A 08/08/2017   Procedure: AV NODE ABLATION;  Surgeon: Hillis Range, MD;  Location: [facility] INVASIVE CV LAB;  Service: Cardiovascular;  Laterality: N/A;   BREAST BIOPSY     CARDIAC DEFIBRILLATOR PLACEMENT  2005   MDT Viva XT CRT-D BiV ICD implanted by Dr Sharlot Gowda in [place]   CATARACT EXTRACTION Bilateral    CHOLECYSTECTOMY     ICD GENERATOR CHANGEOUT N/A 05/10/2021   Procedure: ICD GENERATOR CHANGEOUT;  Surgeon: Marinus Maw, MD;  Location: [facility] INVASIVE CV LAB;  Service: Cardiovascular;  Laterality: N/A;   RIGHT HEART CATH N/A 04/28/2022   Procedure: RIGHT HEART CATH;  Surgeon: Laurey Morale, MD;  Location: [facility] INVASIVE CV LAB;  Service: Cardiovascular;  Laterality: N/A;   RIGHT HEART CATH N/A 07/15/2022   Procedure: RIGHT HEART CATH;  Surgeon: Laurey Morale, MD;  Location: [facility] INVASIVE CV LAB;  Service: Cardiovascular;  Laterality: N/A;   RIGHT HEART CATH AND CORONARY ANGIOGRAPHY N/A 12/17/2020   Procedure: RIGHT HEART CATH AND CORONARY  ANGIOGRAPHY;  Surgeon: Laurey Morale, MD;  Location: [facility] INVASIVE CV LAB;  Service: Cardiovascular;  Laterality: N/A;   TEE WITHOUT CARDIOVERSION N/A 12/17/2020   Procedure: TRANSESOPHAGEAL ECHOCARDIOGRAM (TEE);  Surgeon: Laurey Morale, MD;  Location: [facility] ENDOSCOPY;  Service: Cardiovascular;  Laterality: N/A;   TEE WITHOUT CARDIOVERSION N/A 06/23/2022   Procedure: TRANSESOPHAGEAL ECHOCARDIOGRAM;  Surgeon: Laurey Morale, MD;  Location: [facility] INVASIVE CV LAB;  Service: Cardiovascular;  Laterality: N/A;   THYROIDECTOMY     TOTAL VAGINAL HYSTERECTOMY      Family History  Problem Relation Age of Onset   Hip fracture Mother    COPD Father    CAD Sister    Heart attack Sister     Social History   Socioeconomic History   Marital status: Married    Spouse name: Not on file   Number of children: Not on file   Years of education: Not on file   Highest education level: Not on file  Occupational History   Not on file  Tobacco Use   Smoking  status: Never   Smokeless tobacco: Never  Vaping Use   Vaping Use: Never used  Substance and Sexual Activity   Alcohol use: No   Drug use: No   Sexual activity: Not on file  Other Topics Concern   Not on file  Social History Narrative   Not on file   Social Determinants of Health   Financial Resource Strain: Not on file  Food Insecurity: Not on file  Transportation Needs: Not on file  Physical Activity: Not on file  Stress: Not on file  Social Connections: Not on file  Intimate Partner Violence: Not on file     Prior to Admission medications   Medication Sig Start Date End Date Taking? Authorizing Provider  acetaminophen (TYLENOL) 500 MG tablet Take 500 mg by mouth every 6 (six) hours as needed for mild pain.    [provider]  allopurinol (ZYLOPRIM) 100 MG tablet take 2 tablets in the morning and 1 tablet in the evening. 07/14/22   Fayette Pho, MD  Calcium Carb-Cholecalciferol (CALCIUM + VITAMIN D3 PO) Take 1 tablet by  mouth daily.    [provider]  empagliflozin (JARDIANCE) 10 MG TABS tablet Take 1 tablet (10 mg total) by mouth daily before breakfast. 01/24/22   Laurey Morale, MD  furosemide (LASIX) 40 MG tablet Take 2 tablets (80 mg total) by mouth every morning AND 1 tablet (40 mg total) every evening. 05/25/22 08/23/22  Jacklynn Ganong, FNP  Glucosamine-Chondroitin (OSTEO BI-FLEX REGULAR STRENGTH PO) Take 1 tablet by mouth in the morning.    [provider]  insulin glargine (LANTUS) 100 UNIT/ML Solostar Pen Inject 24 Units into the skin every morning. Use this insulin in place of Tuojeo 05/09/22   Fayette Pho, MD  insulin glargine, 2 Unit Dial, (TOUJEO MAX SOLOSTAR) 300 UNIT/ML Solostar Pen Inject 24 Units into the skin at bedtime. 07/01/22   Fayette Pho, MD  insulin lispro (HUMALOG) 100 UNIT/ML injection Inject 4-8 Units into the skin daily as needed for high blood sugar.    [provider]  INSULIN SYRINGE .5CC/29G 29G X 1/2" 0.5 ML MISC Use to give yourself daily basal insulin (Lantus or Tuojeo). 05/09/22   Fayette Pho, MD  levothyroxine (SYNTHROID) 88 MCG tablet Take 1 tablet (88 mcg total) by mouth daily before breakfast. 06/19/22   Fayette Pho, MD  loratadine (CLARITIN) 10 MG tablet Take 10 mg by mouth in the morning.    [provider]  metoprolol succinate (TOPROL-XL) 100 MG 24 hr tablet take 1 tablet every morning. take the 50 MILLIGRAM every evening. 07/14/22   Laurey Morale, MD  nitroGLYCERIN (NITROSTAT) 0.4 MG SL tablet Place 1 tablet (0.4 mg total) under the tongue every 5 (five) minutes as needed for chest pain. 12/15/21 04/26/24  Jacklynn Ganong, FNP  Omega-3 Fatty Acids (FISH OIL) 1200 MG CPDR Take 1,200 mg by mouth in the morning.    [provider]  omeprazole (PRILOSEC) 40 MG capsule TAKE 1 CAPSULE BY MOUTH ONCE DAILY. 04/12/22   Fayette Pho, MD  sacubitril-valsartan (ENTRESTO) 24-26 MG Take 1 tablet by mouth 2 (two) times  daily. 01/24/22   Laurey Morale, MD  simvastatin (ZOCOR) 20 MG tablet take 1 tablet every day at 6 IN THE EVENING 06/16/22   Jonelle Sidle, MD  spironolactone (ALDACTONE) 25 MG tablet Take 1 tablet (25 mg total) by mouth daily. 06/29/22   Laurey Morale, MD  warfarin (COUMADIN) 2 MG tablet TAKE 1/2  TO 1 TABLET ONCE A DAY AS DIRECTED BY ANTICOAGULATION. Patient taking differently: Take 1-2 mg by mouth See admin instructions. Take 2 mg daily except Wednesdays take 1 mg 05/12/22   Laurey Morale, MD    Allergies  Allergen Reactions   Amiodarone Rash   Codeine Other (See Comments)    Euphoria, hallucinations   Other Palpitations    PT STATES SHE GETS REALLY HOT , DIZZY HEADED, HAS PASSED OUT TWICE , AND ITCHING WITH IV DYE   Turmeric     Pt went into AFIB    Colchicine Rash   Iodine Rash    topical   Ivp Dye [Iodinated Contrast Media] Rash   Mexitil [Mexiletine] Rash    REVIEW OF SYSTEMS:  General: no fevers/chills/night sweats Eyes: no blurry vision, diplopia, or amaurosis ENT: no sore throat or hearing loss Resp: no cough, wheezing, or hemoptysis CV: no edema or palpitations GI: no abdominal pain, nausea, vomiting, diarrhea, or constipation GU: no dysuria, frequency, or hematuria Skin: no rash Neuro: no headache, numbness, tingling, or weakness of extremities Musculoskeletal: no joint pain or swelling Heme: no bleeding, DVT, or easy bruising Endo: no polydipsia or polyuria  BP 110/78   Pulse 86   Ht 5\' 3"  (1.6 m)   Wt 124 lb (56.2 kg)   SpO2 98%   BMI 21.97 kg/m   PHYSICAL EXAM: GEN:  AO x 3 in no acute distress HEENT: normal Dentition: Several missing teeth Neck: JVP normal. +2 carotid upstrokes without bruits. No thyromegaly. Lungs: equal expansion, clear bilaterally CV: Apex is discrete and nondisplaced, RRR without murmur or gallop Abd: soft, non-tender, non-distended; no bruit; positive bowel sounds Ext: no edema, ecchymoses, or cyanosis Vascular: 2+  femoral pulses, 2+ radial pulses       Skin: warm and dry without rash Neuro: CN II-XII grossly intact; motor and sensory grossly intact    DATA AND STUDIES:  EKG:  V-paced  2D ECHO:    TEE:  April 2024  1. Left ventricular ejection fraction, by estimation, is 30 to 35%. The  left ventricle has moderately decreased function. The left ventricle  demonstrates regional wall motion abnormalities with septal akinesis and  global hypokinesis of the other walls.  The left ventricular internal cavity size was mildly dilated.   2. Right ventricular systolic function is mildly reduced. The right  ventricular size is normal.   3. Left atrial size was mildly dilated. No left atrial/left atrial  appendage thrombus was detected. There was "smoke" noted in the left  atrial appendage.   4. Right atrial size was mildly dilated.   5. Small secundum ASD, 0.45 cm in diameter, area 0.123 cm^2.   6. Peak RV-RA gradient 18 mmHg. Tricuspid valve regurgitation is mild to  moderate.   7. The aortic valve is tricuspid. There is severe calcifcation of the  aortic valve. Aortic valve regurgitation is trivial. Mild to moderate  aortic valve stenosis. Aortic valve mean gradient measures 11.0 mmHg with  AVA 1.93 cm^2 by continuity equation.   8. The mitral valve is abnormal. Moderate to severe mitral valve  regurgitation. There was a broad jet of mitral regurgitation (or 2 jets  close together) and a small jet more laterally. PISA ERO from the  broad/larger jet was 0.31 cm^2. Vena contracta  area 0.3 cm^2. No evidence of mitral stenosis (mean gradient 3 mmHg).   9. Grade 3 plaque in the thoracic aorta.   CARDIAC CATH: May 2024 1. Normal filling pressures.  2. Prominent v-waves in PCWP tracing suggestive of significant mitral regurgitation.  3. Good cardiac output.    STS RISK CALCULATOR:   Procedure Type: Isolated MVR PERIOPERATIVE OUTCOME ESTIMATE % Operative Mortality 11.9% Morbidity &  Mortality 32.1% Stroke 4.76% Renal Failure 13% Reoperation 5.54% Prolonged Ventilation 23.2% Deep Sternal Wound Infection 0.106% Long Hospital Stay (>14 days) 22.1% Short Hospital Stay (<6 days)* 12.4%  Procedure Type: Isolated MVr PERIOPERATIVE OUTCOME ESTIMATE % Operative Mortality 9.36% Morbidity & Mortality 23.1% Stroke 3.32% Renal Failure 8.44% Reoperation 5.35% Prolonged Ventilation 15.7% Deep Sternal Wound Infection 0.073% Long Hospital Stay (>14 days) 18.5% Short Hospital Stay (<6 days)* 19%  NHYA CLASS: III  ASSESSMENT AND PLAN:   Nonrheumatic mitral valve regurgitation - Plan: ECHOCARDIOGRAM LIMITED  Chronic systolic heart failure (HCC)  Atrial fibrillation with RVR (HCC)  Type 2 diabetes mellitus with complication, with long-term current use of insulin (HCC)  Hypertension associated with diabetes (HCC)  Hyperlipidemia associated with type 2 diabetes mellitus (HCC)  ASD (atrial septal defect)  The patient has developed severe symptomatic functional mitral regurgitation.  Interestingly it looks like most of her mitral regurgitation is from the medial aspect of the valve and the A3 P3 region.  She is on an excellent 4 drug regimen and has longstanding cardiomyopathy that has been followed serially by advanced heart failure.  I spoke to her about the prospect of mitral transcatheter edge-to-edge repair.  She would definitely like to feel better and to prevent any heart failure hospitalizations.  I have asked her to see her dentist for clearance.  She would like to get this done however her grandson is getting married in [place] on June 21.  I told her I think it is okay for her to travel either by car or air.  We will arrange for the procedure to accommodate her schedule.  Given her medial pathology I think an Edwards device will be best suited for her anatomy.  I do not hear a loud murmur so we will obtain a limited echocardiogram to evaluate her MR.  I have  personally reviewed the patients imaging data as summarized above.  I have reviewed the natural history of mitral regurgitation with the patient and family members who are present today. We have discussed the limitations of medical therapy and the poor prognosis associated with symptomatic mitral regurgitation. We have also reviewed potential treatment options, including palliative medical therapy, conventional mitral surgery, and transcatheter mitral edge-to-edge repair. We discussed treatment options in the context of this patient's specific comorbid medical conditions.   All of the patient's questions were answered today. Will make further recommendations based on the results of studies outlined above.   Total time spent with patient today 40 minutes. This includes reviewing records, evaluating the patient and coordinating care.   [name], MD  07/22/2022 10:30 AM    [facility] [address] [place], [place], [place]  [address] Phone: [phone]; Fax: [phone]

## 2022-07-22 ENCOUNTER — Ambulatory Visit: Payer: PPO | Attending: Internal Medicine | Admitting: Internal Medicine

## 2022-07-22 ENCOUNTER — Encounter: Payer: Self-pay | Admitting: Internal Medicine

## 2022-07-22 VITALS — BP 110/78 | HR 86 | Ht 63.0 in | Wt 124.0 lb

## 2022-07-22 DIAGNOSIS — I4891 Unspecified atrial fibrillation: Secondary | ICD-10-CM | POA: Diagnosis not present

## 2022-07-22 DIAGNOSIS — I5022 Chronic systolic (congestive) heart failure: Secondary | ICD-10-CM | POA: Diagnosis not present

## 2022-07-22 DIAGNOSIS — Q211 Atrial septal defect, unspecified: Secondary | ICD-10-CM

## 2022-07-22 DIAGNOSIS — E1169 Type 2 diabetes mellitus with other specified complication: Secondary | ICD-10-CM

## 2022-07-22 DIAGNOSIS — I340 Nonrheumatic mitral (valve) insufficiency: Secondary | ICD-10-CM | POA: Diagnosis not present

## 2022-07-22 DIAGNOSIS — E785 Hyperlipidemia, unspecified: Secondary | ICD-10-CM

## 2022-07-22 DIAGNOSIS — E1159 Type 2 diabetes mellitus with other circulatory complications: Secondary | ICD-10-CM

## 2022-07-22 DIAGNOSIS — E118 Type 2 diabetes mellitus with unspecified complications: Secondary | ICD-10-CM | POA: Diagnosis not present

## 2022-07-22 DIAGNOSIS — Z794 Long term (current) use of insulin: Secondary | ICD-10-CM

## 2022-07-22 DIAGNOSIS — I152 Hypertension secondary to endocrine disorders: Secondary | ICD-10-CM

## 2022-07-22 NOTE — Patient Instructions (Signed)
Medication Instructions:  No changes *If you need a refill on your cardiac medications before your next appointment, please call your pharmacy*   Lab Work: none If you have labs (blood work) drawn today and your tests are completely normal, you will receive your results only by: MyChart Message (if you have MyChart) OR A paper copy in the mail If you have any lab test that is abnormal or we need to change your treatment, we will call you to review the results.   Testing/Procedures: LIMITED ECHO - WE WILL CALL YOU TO SCHEDULE THIS Your physician has requested that you have an echocardiogram. Echocardiography is a painless test that uses sound waves to create images of your heart. It provides your doctor with information about the size and shape of your heart and how well your heart's chambers and valves are working. This procedure takes approximately one hour. There are no restrictions for this procedure. Please do NOT wear cologne, perfume, aftershave, or lotions (deodorant is allowed). Please arrive 15 minutes prior to your appointment time.    Follow-Up: Per Structural Heart Team

## 2022-08-02 ENCOUNTER — Ambulatory Visit (HOSPITAL_COMMUNITY): Payer: PPO | Attending: Internal Medicine

## 2022-08-02 DIAGNOSIS — I340 Nonrheumatic mitral (valve) insufficiency: Secondary | ICD-10-CM | POA: Diagnosis present

## 2022-08-02 LAB — ECHOCARDIOGRAM LIMITED
AV Mean grad: 7.5 mmHg
AV Peak grad: 13.1 mmHg
Ao pk vel: 1.81 m/s
MV M vel: 4.95 m/s
MV Peak grad: 97.8 mmHg
S' Lateral: 4.8 cm

## 2022-08-03 ENCOUNTER — Ambulatory Visit: Payer: PPO | Attending: Cardiology | Admitting: *Deleted

## 2022-08-03 DIAGNOSIS — Z5181 Encounter for therapeutic drug level monitoring: Secondary | ICD-10-CM

## 2022-08-03 DIAGNOSIS — I4891 Unspecified atrial fibrillation: Secondary | ICD-10-CM

## 2022-08-03 DIAGNOSIS — I513 Intracardiac thrombosis, not elsewhere classified: Secondary | ICD-10-CM

## 2022-08-03 LAB — POCT INR: INR: 1.9 — AB (ref 2.0–3.0)

## 2022-08-03 NOTE — Patient Instructions (Signed)
Increase warfarin to 1 tablet daily Recheck in 3 weeks 

## 2022-08-09 ENCOUNTER — Other Ambulatory Visit: Payer: Self-pay

## 2022-08-09 ENCOUNTER — Telehealth: Payer: Self-pay

## 2022-08-09 DIAGNOSIS — I5022 Chronic systolic (congestive) heart failure: Secondary | ICD-10-CM

## 2022-08-09 DIAGNOSIS — I340 Nonrheumatic mitral (valve) insufficiency: Secondary | ICD-10-CM

## 2022-08-09 NOTE — Telephone Encounter (Signed)
Reviewed the patient's recent echo during Valve Team meeting today. The patient's MR is severe and it is appropriate to proceed with mTEER at this time.  Spoke with the patient. She will be out of the [place] 6[place]19[place]23. She wishes to proceed with mTEER on 08/31/2022. She has had a dental visit and was told she has no infections and may proceed with surgery. Scheduled her for pre-procedure visit with Georgie Chard 6/26. She was grateful for call and agreed with plan.

## 2022-08-10 ENCOUNTER — Encounter: Payer: Self-pay | Admitting: Internal Medicine

## 2022-08-10 NOTE — Progress Notes (Signed)
PERIOPERATIVE PRESCRIPTION FOR IMPLANTED CARDIAC DEVICE PROGRAMMING  Patient Information: Name:  SAREA FYFE  DOB:  03/20/1950  MRN:  [id number]    Patient Name: Jissel Slavens. Julien Girt Date of Birth: 10/19/1950 Procedure: Mitral Valve Repair Provider: Dr. Velva Harman  Device Information:  Clinic EP Physician:  Lewayne Bunting, MD   Device Type:  Defibrillator Manufacturer and Phone #:  [employer]tronic: [id number] Pacemaker Dependent?:  Yes.   Date of Last Device Check:  08/09/2022 Normal Device Function?:  Yes.    Electrophysiologist's Recommendations:  Have magnet available. Provide continuous ECG monitoring when magnet is used or reprogramming is to be performed.  Procedure will likely interfere with device function.  Device should be programmed:  Tachy therapies disabled  Per Device Clinic Standing Orders, Lenor Coffin, RN  3:42 PM 08/10/2022

## 2022-08-15 ENCOUNTER — Other Ambulatory Visit (HOSPITAL_COMMUNITY): Payer: Self-pay

## 2022-08-15 ENCOUNTER — Ambulatory Visit: Payer: PPO | Attending: Internal Medicine

## 2022-08-15 DIAGNOSIS — I5022 Chronic systolic (congestive) heart failure: Secondary | ICD-10-CM | POA: Diagnosis not present

## 2022-08-15 DIAGNOSIS — Z95810 Presence of automatic (implantable) cardiac defibrillator: Secondary | ICD-10-CM

## 2022-08-17 ENCOUNTER — Telehealth: Payer: Self-pay

## 2022-08-17 NOTE — Progress Notes (Signed)
EPIC Encounter for ICM Monitoring  Patient Name: Emily [name] is a 72 y.o. female Date: 08/17/2022 Primary Care Physican: Fayette Pho, MD Primary Cardiologist: [facility] Electrophysiologist: Ladona Ridgel Nephrologist: [facility]-Dr Lacy Duverney  Bi-V Pacing: 92%  04/04/2022 Weight:  128 lbs 04/19/2022 Weight: 128 lbs 05/04/2022   Weight: 123 lbs 06/08/2022 Weight: 122-123 lbs    VT-NS (>4 beats, >150 bpm)     3    Attempted call to patient and unable to reach.  Transmission reviewed.          Optivol Thoracic impedance suggesting normal fluid levels.   Prescribed: Furosemide 80 mg Take 1 tablet (80 mg total) by mouth every morning and 0.5 tablet (40 mg total) every evening.     Spironolactone 25 mg take 0.5 tablet by mouth daily   Labs: 07/15/2022 Potassium 2.7, Sodium 140  07/06/2022 Creatinine 2.08, BUN 32, Potassium 3.6, Sodium 134, GFR 25  06/23/2022 Creatinine 1.30, BUN 30, Potassium 3.1, Sodium 145  05/18/2022 Creatinine 2.01, BUN 43, Potassium 3.6, Sodium 134, GFR 26 04/19/2022 Creatinine 2.12, BUN 32, Potassium 4.0, Sodium 136, GFR 24 01/24/2022 Creatinine 2.31, BUN 31, Potassium 5.0, Sodium 136, GFR 22 A complete set of results can be found in Results Review.   Recommendations: Unable to reach.     Follow-up plan: ICM clinic phone appointment on 09/19/2022.  91 day device clinic remote transmission 09/05/2022.      EP/Cardiology Office Visits:  08/23/2022 with Dr Ladona Ridgel.   10/11/2022 with Dr Shirlee Latch.       Copy of ICM check sent to Dr. Ladona Ridgel   3 month ICM trend: 08/15/2022.    12-14 Month ICM trend:     Karie Soda, RN 08/17/2022 4:18 PM

## 2022-08-17 NOTE — Telephone Encounter (Signed)
Remote ICM transmission received.  Attempted call to patient regarding ICM remote transmission and no answer or voice mail option.  

## 2022-08-22 ENCOUNTER — Other Ambulatory Visit (HOSPITAL_COMMUNITY): Payer: Self-pay

## 2022-08-23 ENCOUNTER — Encounter: Payer: PPO | Admitting: Internal Medicine

## 2022-08-23 NOTE — Progress Notes (Unsigned)
[facility]   MULTIDISCIPLINARY HEART VALVE CLINIC                                     Cardiology Office Note:    Date:  08/25/2022   ID:  Emily [name], DOB 06-21-1950, MRN [id number]  PCP:  Fayette Pho, MD  [facility] Cardiologist:  Marca Ancona, MD  [facility] Electrophysiologist:  Dr. Lewayne Bunting, MD   Referring MD: Fayette Pho, MD   Chief Complaint  Patient presents with   pre mTEER   History of Present Illness:    Emily [name] is a 72 y.o. 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   Emily [name] has been followed with Dr. Shirlee Latch for her complex cardiology care. She was initially diagnosed with myocarditis following an admission at [facility] with subsequent cardiac biopsy. Patient reported that her EF was down after that point. She had MDT CRT-device placed after that. Her EF has remained low on subsequent echo imaging. Echo in 7/21 showed EF 20-25%, moderate MR, mild AS. She is now in permanent atrial fibrillation and is s/p AV nodal ablation with BiV pacing. Elevated creatinine has limited medication management. TEE from 06/23/22 showed persistnetly decreased LV function at 30-35% with septal akinesis and  global hypokinesis of the other walls, small secundum ASD, mild to moderate AS, and moderate to severe MR. She was then referred to Dr. Lynnette Caffey for consideration of mTEER for her MR.   Today she is here alone and reports that she has been very stable since she was last seen. She denies chest pain, SOB, palpitations, LE edema, orthopnea, PND, dizziness, or syncope. Denies bleeding in stool or urine.   Past Medical History:  Diagnosis Date   Acute pain of right shoulder 03/22/2019   Acute renal failure (HCC)  04/15/2012   Last Assessment & Plan:  Superimposed on CK D stage III, Baseline Cr ~2-2.5, remains above baseline, felt to be due to Cardiorenal syndrome, creatinine trending down at time of discharge Negative urine eos and no urinary retention ACE inhibitor discontinued Follows with Dr Julien Girt as outpatient, to have labs drawn 08/15/16 and results sent to SENephrology office for review   Anemia 12/29/2016   Atrial fibrillation with RVR (HCC)    Biventricular ICD (implantable cardioverter-defibrillator) in place    CHF (congestive heart failure) (HCC)    CKD (chronic kidney disease), stage IV (HCC)    Colitis 09/11/2012   Complete heart block (HCC)    Essential hypertension    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 Physical exam revealed cerumen impaction of left ear canal. Relieved by flushing.    Hypomagnesemia 04/10/2016   Hyponatremia 04/10/2016   Last Assessment & Plan:  - improved after tolvaptan 6/29, chiefly driven by CHF, further improvement with ongoing IV Lasix - continue low Na and fluid restriction - compliance with sodium restriction a major barrier to keeping patient out of hospital and compensated, remains resistant to this  philosophy while inpatient - follows with Dr Julien Girt as outpatient, Neph consult not indicated at this ti   ICD (implantable cardioverter-defibrillator) battery depletion 04/21/2014   Localized macular rash 08/11/2016   Last Assessment & Plan:  Seems to trend with heart failure, though she has more findings behind bilateral knees and up into her thigh/groin area than when I last saw her Would use steroid cream for now, consider alternative diagnosis if no improvement   Long-term insulin use in type 2 diabetes (HCC) 04/10/2016   Last Assessment & Plan:  Hypoglycemia resolved, continue  SSI and hypoglycemia protocol   Nonischemic cardiomyopathy (HCC)    On amiodarone therapy 08/21/2017   Persistent atrial fibrillation (HCC)    AV node ablation 2019   Restless leg syndrome 03/22/2019   Type 2 diabetes mellitus (HCC)     Past Surgical History:  Procedure Laterality Date   ABLATION     AV NODE ABLATION N/A 08/08/2017   Procedure: AV NODE ABLATION;  Surgeon: Hillis Range, MD;  Location: [facility] INVASIVE CV LAB;  Service: Cardiovascular;  Laterality: N/A;   BREAST BIOPSY     CARDIAC DEFIBRILLATOR PLACEMENT  2005   MDT Viva XT CRT-D BiV ICD implanted by Dr Sharlot Gowda in [place]   CATARACT EXTRACTION Bilateral    CHOLECYSTECTOMY     ICD GENERATOR CHANGEOUT N/A 05/10/2021   Procedure: ICD GENERATOR CHANGEOUT;  Surgeon: Marinus Maw, MD;  Location: [facility] INVASIVE CV LAB;  Service: Cardiovascular;  Laterality: N/A;   RIGHT HEART CATH N/A 04/28/2022   Procedure: RIGHT HEART CATH;  Surgeon: Laurey Morale, MD;  Location: [facility] INVASIVE CV LAB;  Service: Cardiovascular;  Laterality: N/A;   RIGHT HEART CATH N/A 07/15/2022   Procedure: RIGHT HEART CATH;  Surgeon: Laurey Morale, MD;  Location: [facility] INVASIVE CV LAB;  Service: Cardiovascular;  Laterality: N/A;   RIGHT HEART CATH AND CORONARY ANGIOGRAPHY N/A 12/17/2020   Procedure: RIGHT HEART CATH AND CORONARY ANGIOGRAPHY;  Surgeon: Laurey Morale, MD;  Location: [facility] INVASIVE CV LAB;  Service: Cardiovascular;  Laterality: N/A;   TEE WITHOUT CARDIOVERSION N/A 12/17/2020   Procedure: TRANSESOPHAGEAL ECHOCARDIOGRAM (TEE);  Surgeon: Laurey Morale, MD;  Location: [facility] ENDOSCOPY;  Service: Cardiovascular;  Laterality: N/A;   TEE WITHOUT CARDIOVERSION N/A 06/23/2022   Procedure: TRANSESOPHAGEAL ECHOCARDIOGRAM;  Surgeon: Laurey Morale, MD;  Location: [facility] INVASIVE CV LAB;  Service: Cardiovascular;  Laterality: N/A;   THYROIDECTOMY     TOTAL VAGINAL HYSTERECTOMY      Current Medications: Current Meds  Medication Sig   acetaminophen  (TYLENOL) 500 MG tablet Take 500 mg by mouth every 6 (six) hours as needed for mild pain.   allopurinol (ZYLOPRIM) 100 MG tablet take 2 tablets in the morning and 1 tablet in the evening.   Calcium Carb-Cholecalciferol (CALCIUM + VITAMIN D3 [name]) Take 1 tablet by mouth daily.   empagliflozin (JARDIANCE) 10 MG TABS tablet Take 1 tablet (10 mg total) by mouth daily before breakfast.   Glucosamine-Chondroitin (OSTEO BI-FLEX REGULAR STRENGTH [name]) Take 1 tablet by mouth in the morning.   insulin glargine, 2 Unit Dial, (TOUJEO MAX SOLOSTAR) 300 UNIT/ML Solostar Pen Inject 24 Units into the skin at bedtime.   insulin lispro (HUMALOG) 100 UNIT/ML injection Inject 4-8 Units into the skin daily as needed for high blood sugar.   INSULIN SYRINGE .5CC/29G 29G X 1/2" 0.5 ML MISC Use to give yourself daily basal insulin (Lantus or Tuojeo).   levothyroxine (SYNTHROID) 88 MCG tablet Take  1 tablet (88 mcg total) by mouth daily before breakfast.   loratadine (CLARITIN) 10 MG tablet Take 10 mg by mouth in the morning.   metoprolol succinate (TOPROL-XL) 100 MG 24 hr tablet take 1 tablet every morning. take the 50 MILLIGRAM every evening. (Patient taking differently: Take 100 mg by mouth daily.)   nitroGLYCERIN (NITROSTAT) 0.4 MG SL tablet Place 1 tablet (0.4 mg total) under the tongue every 5 (five) minutes as needed for chest pain.   Omega-3 Fatty Acids (FISH OIL) 1200 MG CPDR Take 1,200 mg by mouth in the morning.   omeprazole (PRILOSEC) 40 MG capsule TAKE 1 CAPSULE BY MOUTH ONCE DAILY.   sacubitril-valsartan (ENTRESTO) 24-26 MG Take 1 tablet by mouth 2 (two) times daily.   simvastatin (ZOCOR) 20 MG tablet take 1 tablet every day at 6 IN THE EVENING   spironolactone (ALDACTONE) 25 MG tablet Take 1 tablet (25 mg total) by mouth daily.   warfarin (COUMADIN) 2 MG tablet TAKE 1/2 TO 1 TABLET ONCE A DAY AS DIRECTED BY ANTICOAGULATION. (Patient taking differently: Take 1-2 mg by mouth See admin instructions. Take 2 mg daily  except Wednesdays take 1 mg)     Allergies:   Amiodarone, Codeine, Other, Turmeric, Colchicine, Iodine, Ivp dye [iodinated contrast media], and Mexitil [mexiletine]   Social History   Socioeconomic History   Marital status: Married    Spouse name: Not on file   Number of children: Not on file   Years of education: Not on file   Highest education level: Not on file  Occupational History   Not on file  Tobacco Use   Smoking status: Never   Smokeless tobacco: Never  Vaping Use   Vaping Use: Never used  Substance and Sexual Activity   Alcohol use: No   Drug use: No   Sexual activity: Not on file  Other Topics Concern   Not on file  Social History Narrative   Not on file   Social Determinants of Health   Financial Resource Strain: Not on file  Food Insecurity: Not on file  Transportation Needs: Not on file  Physical Activity: Not on file  Stress: Not on file  Social Connections: Not on file    Family History: The patient's family history includes CAD in her sister; COPD in her father; Heart attack in her sister; Hip fracture in her mother.  ROS:   Please see the history of present illness.    All other systems reviewed and are negative.  EKGs/Labs/Other Studies Reviewed:    The following studies were reviewed today:  TEE 06/23/2022:   1. Left ventricular ejection fraction, by estimation, is 30 to 35%. The  left ventricle has moderately decreased function. The left ventricle  demonstrates regional wall motion abnormalities with septal akinesis and  global hypokinesis of the other walls.  The left ventricular internal cavity size was mildly dilated.   2. Right ventricular systolic function is mildly reduced. The right  ventricular size is normal.   3. Left atrial size was mildly dilated. No left atrial/left atrial  appendage thrombus was detected. There was "smoke" noted in the left  atrial appendage.   4. Right atrial size was mildly dilated.   5. Small secundum  ASD, 0.45 cm in diameter, area 0.123 cm^2.   6. Peak RV-RA gradient 18 mmHg. Tricuspid valve regurgitation is mild to  moderate.   7. The aortic valve is tricuspid. There is severe calcifcation of the  aortic valve. Aortic valve regurgitation  is trivial. Mild to moderate  aortic valve stenosis. Aortic valve mean gradient measures 11.0 mmHg with  AVA 1.93 cm^2 by continuity equation.   8. The mitral valve is abnormal. Moderate to severe mitral valve  regurgitation. There was a broad jet of mitral regurgitation (or 2 jets  close together) and a small jet more laterally. PISA ERO from the  broad/larger jet was 0.31 cm^2. Vena contracta  area 0.3 cm^2. No evidence of mitral stenosis (mean gradient 3 mmHg).   9. Grade 3 plaque in the thoracic aorta.   RHC 07/15/22:  1. Normal filling pressures.  2. Prominent v-waves in PCWP tracing suggestive of significant mitral regurgitation.  3. Good cardiac output.    Compensated CHF with evidence for significant mitral regurgitation.   Echo 08/02/2022:   1. Akinesis of the anteroseptal, anterior, anteroseptal wall from the  base to apex. Left ventricular ejection fraction, by estimation, is 30 to  35%. The left ventricle has moderately decreased function. The left  ventricle demonstrates global  hypokinesis. Left ventricular diastolic parameters are indeterminate.   2. Right ventricular systolic function is moderately reduced. The right  ventricular size is normal.   3. Functional MR. The mitral valve is grossly normal. Moderate mitral  valve regurgitation.   4. The inferior vena cava is dilated in size with >50% respiratory  variability, suggesting right atrial pressure of 8 mmHg.   EKG:  EKG is ordered today.  The ekg ordered today demonstrates NSR with biventricular pacing  Recent Labs: 12/28/2021: TSH 0.340 05/18/2022: B Natriuretic Peptide 354.0 08/24/2022: ALT 6; BUN 45; Creatinine, Ser 2.37; Hemoglobin 11.5; NT-Pro BNP 3,372; Platelets  335; Potassium 4.3; Sodium 133   Recent Lipid Panel    Component Value Date/Time   CHOL 157 12/28/2021 1605   TRIG 174 (H) 12/28/2021 1605   HDL 36 (L) 12/28/2021 1605   CHOLHDL 4.4 12/28/2021 1605   LDLCALC 91 12/28/2021 1605   Physical Exam:    VS:  BP 122/72   Pulse 89   Ht 5\' 3"  (1.6 m)   Wt 123 lb 9.6 oz (56.1 kg)   SpO2 100%   BMI 21.89 kg/m     Wt Readings from Last 3 Encounters:  08/24/22 123 lb 9.6 oz (56.1 kg)  07/22/22 124 lb (56.2 kg)  07/15/22 122 lb (55.3 kg)    General: Well developed, well nourished, NAD Neck: Negative for carotid bruits. No JVD Lungs:Clear to ausculation bilaterally. No wheezes, rales, or rhonchi. Breathing is unlabored. Cardiovascular: RRR with S1 S2. + murmur Extremities: No edema.  Neuro: Alert and oriented. No focal deficits. No facial asymmetry. MAE spontaneously. Psych: Responds to questions appropriately with normal affect.    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 Most recent TEE with EF 30-35%, PMVL length: 0.9cm. Mean gradient: , Peak gradient: 10.76mmHg, Jet: broad central jet and small lateral. MR PISA radius: 0.70cm. MR PISA Eff ROA: 24mm2. Plan for mTEER with Randa Evens 08/31/22. Doing well today with NYHA class I symptoms. Pre procedure instructions reviewed with understanding. CHG soap given. Obtain CMET, CBC, BNP, INR today.   NICM: w/ persistent LVEF at 30-35% s/p CRT-D followed by AHF. Appears euvolemic on exam today although BNP elevated on pre procedure labs. Plan to treat with IV Lasix in the post procedure setting. Continue GDMT per AHF team.   Permanent AF s/p AV node ablation: Maintaining NSR. Continue Coumadin through 6/27 then she was instructed  to stop for mTEER. Will plan to resume post procedure.   Stage IV CKD: Cr slightly elevated above most recent baseline. Plan to re-check on 7/3 and follow trend.   DM2: Pre procedure insulin  instructions reviewed with the patient with understanding. Will cover with SSI while inpatient.   Moderate AS: Noted on TEE, echocardiogram with a mean gradient at and AVA by VTI at 1.93. Continue to follow closely.   Medication Adjustments/Labs and Tests Ordered: Current medicines are reviewed at length with the patient today.  Concerns regarding medicines are outlined above.  Orders Placed This Encounter  Procedures   Comprehensive metabolic panel   CBC   Pro b natriuretic peptide (BNP)   Protime-INR   EKG 12-Lead   No orders of the defined types were placed in this encounter.   Patient Instructions  Medication Instructions:  Your physician recommends that you continue on your current medications as directed. Please refer to the Current Medication list given to you today.  *If you need a refill on your cardiac medications before your next appointment, please call your pharmacy*   Lab Work: TODAY: CMET, CBC, BNP, INR If you have labs (blood work) drawn today and your tests are completely normal, you will receive your results only by: MyChart Message (if you have MyChart) OR A paper copy in the mail If you have any lab test that is abnormal or we need to change your treatment, we will call you to review the results.   Testing/Procedures: SEE INSTRUCTION LETTER, EKG   Follow-Up: At [facility], you and your health needs are our priority.  As part of our continuing mission to provide you with exceptional heart care, we have created designated Provider Care Teams.  These Care Teams include your primary Cardiologist (physician) and Advanced Practice Providers (APPs -  Physician Assistants and Nurse Practitioners) who all work together to provide you with the care you need, when you need it.  We recommend signing up for the patient portal called "MyChart".  Sign up information is provided on this After Visit Summary.  MyChart is used to connect with patients for  Virtual Visits (Telemedicine).  Patients are able to view lab/test results, encounter notes, upcoming appointments, etc.  Non-urgent messages can be sent to your provider as well.   To learn more about what you can do with MyChart, go to [email or web address].    Your next appointment:   POST PROCEDURAL FOLLOW-UP   Signed, Georgie Chard, NP  08/25/2022 2:01 PM    [facility]

## 2022-08-23 NOTE — H&P (View-Only) (Signed)
[facility]   MULTIDISCIPLINARY HEART VALVE CLINIC                                     Cardiology Office Note:    Date:  08/25/2022   ID:  [name], DOB 06-21-1950, MRN [id number]  PCP:  Fayette Pho, MD  [facility] Cardiologist:  Marca Ancona, MD  [facility] Electrophysiologist:  Dr. Lewayne Bunting, MD   Referring MD: Fayette Pho, MD   Chief Complaint  Patient presents with   pre mTEER   History of Present Illness:    Emily [name] is a 72 y.o. 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   Emily [name] has been followed with Dr. Shirlee Latch for her complex cardiology care. She was initially diagnosed with myocarditis following an admission at [facility] with subsequent cardiac biopsy. Patient reported that her EF was down after that point. She had MDT CRT-device placed after that. Her EF has remained low on subsequent echo imaging. Echo in 7/21 showed EF 20-25%, moderate MR, mild AS. She is now in permanent atrial fibrillation and is s/p AV nodal ablation with BiV pacing. Elevated creatinine has limited medication management. TEE from 06/23/22 showed persistnetly decreased LV function at 30-35% with septal akinesis and  global hypokinesis of the other walls, small secundum ASD, mild to moderate AS, and moderate to severe MR. She was then referred to Dr. Lynnette Caffey for consideration of mTEER for her MR.   Today she is here alone and reports that she has been very stable since she was last seen. She denies chest pain, SOB, palpitations, LE edema, orthopnea, PND, dizziness, or syncope. Denies bleeding in stool or urine.   Past Medical History:  Diagnosis Date   Acute pain of right shoulder 03/22/2019   Acute renal failure (HCC)  04/15/2012   Last Assessment & Plan:  Superimposed on CK D stage III, Baseline Cr ~2-2.5, remains above baseline, felt to be due to Cardiorenal syndrome, creatinine trending down at time of discharge Negative urine eos and no urinary retention ACE inhibitor discontinued Follows with Dr Julien Girt as outpatient, to have labs drawn 08/15/16 and results sent to SENephrology office for review   Anemia 12/29/2016   Atrial fibrillation with RVR (HCC)    Biventricular ICD (implantable cardioverter-defibrillator) in place    CHF (congestive heart failure) (HCC)    CKD (chronic kidney disease), stage IV (HCC)    Colitis 09/11/2012   Complete heart block (HCC)    Essential hypertension    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 Physical exam revealed cerumen impaction of left ear canal. Relieved by flushing.    Hypomagnesemia 04/10/2016   Hyponatremia 04/10/2016   Last Assessment & Plan:  - improved after tolvaptan 6/29, chiefly driven by CHF, further improvement with ongoing IV Lasix - continue low Na and fluid restriction - compliance with sodium restriction a major barrier to keeping patient out of hospital and compensated, remains resistant to this  philosophy while inpatient - follows with Dr Julien Girt as outpatient, Neph consult not indicated at this ti   ICD (implantable cardioverter-defibrillator) battery depletion 04/21/2014   Localized macular rash 08/11/2016   Last Assessment & Plan:  Seems to trend with heart failure, though she has more findings behind bilateral knees and up into her thigh/groin area than when I last saw her Would use steroid cream for now, consider alternative diagnosis if no improvement   Long-term insulin use in type 2 diabetes (HCC) 04/10/2016   Last Assessment & Plan:  Hypoglycemia resolved, continue  SSI and hypoglycemia protocol   Nonischemic cardiomyopathy (HCC)    On amiodarone therapy 08/21/2017   Persistent atrial fibrillation (HCC)    AV node ablation 2019   Restless leg syndrome 03/22/2019   Type 2 diabetes mellitus (HCC)     Past Surgical History:  Procedure Laterality Date   ABLATION     AV NODE ABLATION N/A 08/08/2017   Procedure: AV NODE ABLATION;  Surgeon: Hillis Range, MD;  Location: [facility] INVASIVE CV LAB;  Service: Cardiovascular;  Laterality: N/A;   BREAST BIOPSY     CARDIAC DEFIBRILLATOR PLACEMENT  2005   MDT Viva XT CRT-D BiV ICD implanted by Dr Sharlot Gowda in [place]   CATARACT EXTRACTION Bilateral    CHOLECYSTECTOMY     ICD GENERATOR CHANGEOUT N/A 05/10/2021   Procedure: ICD GENERATOR CHANGEOUT;  Surgeon: Marinus Maw, MD;  Location: [facility] INVASIVE CV LAB;  Service: Cardiovascular;  Laterality: N/A;   RIGHT HEART CATH N/A 04/28/2022   Procedure: RIGHT HEART CATH;  Surgeon: Laurey Morale, MD;  Location: [facility] INVASIVE CV LAB;  Service: Cardiovascular;  Laterality: N/A;   RIGHT HEART CATH N/A 07/15/2022   Procedure: RIGHT HEART CATH;  Surgeon: Laurey Morale, MD;  Location: [facility] INVASIVE CV LAB;  Service: Cardiovascular;  Laterality: N/A;   RIGHT HEART CATH AND CORONARY ANGIOGRAPHY N/A 12/17/2020   Procedure: RIGHT HEART CATH AND CORONARY ANGIOGRAPHY;  Surgeon: Laurey Morale, MD;  Location: [facility] INVASIVE CV LAB;  Service: Cardiovascular;  Laterality: N/A;   TEE WITHOUT CARDIOVERSION N/A 12/17/2020   Procedure: TRANSESOPHAGEAL ECHOCARDIOGRAM (TEE);  Surgeon: Laurey Morale, MD;  Location: [facility] ENDOSCOPY;  Service: Cardiovascular;  Laterality: N/A;   TEE WITHOUT CARDIOVERSION N/A 06/23/2022   Procedure: TRANSESOPHAGEAL ECHOCARDIOGRAM;  Surgeon: Laurey Morale, MD;  Location: [facility] INVASIVE CV LAB;  Service: Cardiovascular;  Laterality: N/A;   THYROIDECTOMY     TOTAL VAGINAL HYSTERECTOMY      Current Medications: Current Meds  Medication Sig   acetaminophen  (TYLENOL) 500 MG tablet Take 500 mg by mouth every 6 (six) hours as needed for mild pain.   allopurinol (ZYLOPRIM) 100 MG tablet take 2 tablets in the morning and 1 tablet in the evening.   Calcium Carb-Cholecalciferol (CALCIUM + VITAMIN D3 [name]) Take 1 tablet by mouth daily.   empagliflozin (JARDIANCE) 10 MG TABS tablet Take 1 tablet (10 mg total) by mouth daily before breakfast.   Glucosamine-Chondroitin (OSTEO BI-FLEX REGULAR STRENGTH [name]) Take 1 tablet by mouth in the morning.   insulin glargine, 2 Unit Dial, (TOUJEO MAX SOLOSTAR) 300 UNIT/ML Solostar Pen Inject 24 Units into the skin at bedtime.   insulin lispro (HUMALOG) 100 UNIT/ML injection Inject 4-8 Units into the skin daily as needed for high blood sugar.   INSULIN SYRINGE .5CC/29G 29G X 1/2" 0.5 ML MISC Use to give yourself daily basal insulin (Lantus or Tuojeo).   levothyroxine (SYNTHROID) 88 MCG tablet Take  1 tablet (88 mcg total) by mouth daily before breakfast.   loratadine (CLARITIN) 10 MG tablet Take 10 mg by mouth in the morning.   metoprolol succinate (TOPROL-XL) 100 MG 24 hr tablet take 1 tablet every morning. take the 50 MILLIGRAM every evening. (Patient taking differently: Take 100 mg by mouth daily.)   nitroGLYCERIN (NITROSTAT) 0.4 MG SL tablet Place 1 tablet (0.4 mg total) under the tongue every 5 (five) minutes as needed for chest pain.   Omega-3 Fatty Acids (FISH OIL) 1200 MG CPDR Take 1,200 mg by mouth in the morning.   omeprazole (PRILOSEC) 40 MG capsule TAKE 1 CAPSULE BY MOUTH ONCE DAILY.   sacubitril-valsartan (ENTRESTO) 24-26 MG Take 1 tablet by mouth 2 (two) times daily.   simvastatin (ZOCOR) 20 MG tablet take 1 tablet every day at 6 IN THE EVENING   spironolactone (ALDACTONE) 25 MG tablet Take 1 tablet (25 mg total) by mouth daily.   warfarin (COUMADIN) 2 MG tablet TAKE 1/2 TO 1 TABLET ONCE A DAY AS DIRECTED BY ANTICOAGULATION. (Patient taking differently: Take 1-2 mg by mouth See admin instructions. Take 2 mg daily  except Wednesdays take 1 mg)     Allergies:   Amiodarone, Codeine, Other, Turmeric, Colchicine, Iodine, Ivp dye [iodinated contrast media], and Mexitil [mexiletine]   Social History   Socioeconomic History   Marital status: Married    Spouse name: Not on file   Number of children: Not on file   Years of education: Not on file   Highest education level: Not on file  Occupational History   Not on file  Tobacco Use   Smoking status: Never   Smokeless tobacco: Never  Vaping Use   Vaping Use: Never used  Substance and Sexual Activity   Alcohol use: No   Drug use: No   Sexual activity: Not on file  Other Topics Concern   Not on file  Social History Narrative   Not on file   Social Determinants of Health   Financial Resource Strain: Not on file  Food Insecurity: Not on file  Transportation Needs: Not on file  Physical Activity: Not on file  Stress: Not on file  Social Connections: Not on file    Family History: The patient's family history includes CAD in her sister; COPD in her father; Heart attack in her sister; Hip fracture in her mother.  ROS:   Please see the history of present illness.    All other systems reviewed and are negative.  EKGs/Labs/Other Studies Reviewed:    The following studies were reviewed today:  TEE 06/23/2022:   1. Left ventricular ejection fraction, by estimation, is 30 to 35%. The  left ventricle has moderately decreased function. The left ventricle  demonstrates regional wall motion abnormalities with septal akinesis and  global hypokinesis of the other walls.  The left ventricular internal cavity size was mildly dilated.   2. Right ventricular systolic function is mildly reduced. The right  ventricular size is normal.   3. Left atrial size was mildly dilated. No left atrial/left atrial  appendage thrombus was detected. There was "smoke" noted in the left  atrial appendage.   4. Right atrial size was mildly dilated.   5. Small secundum  ASD, 0.45 cm in diameter, area 0.123 cm^2.   6. Peak RV-RA gradient 18 mmHg. Tricuspid valve regurgitation is mild to  moderate.   7. The aortic valve is tricuspid. There is severe calcifcation of the  aortic valve. Aortic valve regurgitation  is trivial. Mild to moderate  aortic valve stenosis. Aortic valve mean gradient measures 11.0 mmHg with  AVA 1.93 cm^2 by continuity equation.   8. The mitral valve is abnormal. Moderate to severe mitral valve  regurgitation. There was a broad jet of mitral regurgitation (or 2 jets  close together) and a small jet more laterally. PISA ERO from the  broad/larger jet was 0.31 cm^2. Vena contracta  area 0.3 cm^2. No evidence of mitral stenosis (mean gradient 3 mmHg).   9. Grade 3 plaque in the thoracic aorta.   RHC 07/15/22:  1. Normal filling pressures.  2. Prominent v-waves in PCWP tracing suggestive of significant mitral regurgitation.  3. Good cardiac output.    Compensated CHF with evidence for significant mitral regurgitation.   Echo 08/02/2022:   1. Akinesis of the anteroseptal, anterior, anteroseptal wall from the  base to apex. Left ventricular ejection fraction, by estimation, is 30 to  35%. The left ventricle has moderately decreased function. The left  ventricle demonstrates global  hypokinesis. Left ventricular diastolic parameters are indeterminate.   2. Right ventricular systolic function is moderately reduced. The right  ventricular size is normal.   3. Functional MR. The mitral valve is grossly normal. Moderate mitral  valve regurgitation.   4. The inferior vena cava is dilated in size with >50% respiratory  variability, suggesting right atrial pressure of 8 mmHg.   EKG:  EKG is ordered today.  The ekg ordered today demonstrates NSR with biventricular pacing  Recent Labs: 12/28/2021: TSH 0.340 05/18/2022: B Natriuretic Peptide 354.0 08/24/2022: ALT 6; BUN 45; Creatinine, Ser 2.37; Hemoglobin 11.5; NT-Pro BNP 3,372; Platelets  335; Potassium 4.3; Sodium 133   Recent Lipid Panel    Component Value Date/Time   CHOL 157 12/28/2021 1605   TRIG 174 (H) 12/28/2021 1605   HDL 36 (L) 12/28/2021 1605   CHOLHDL 4.4 12/28/2021 1605   LDLCALC 91 12/28/2021 1605   Physical Exam:    VS:  BP 122/72   Pulse 89   Ht 5\' 3"  (1.6 m)   Wt 123 lb 9.6 oz (56.1 kg)   SpO2 100%   BMI 21.89 kg/m     Wt Readings from Last 3 Encounters:  08/24/22 123 lb 9.6 oz (56.1 kg)  07/22/22 124 lb (56.2 kg)  07/15/22 122 lb (55.3 kg)    General: Well developed, well nourished, NAD Neck: Negative for carotid bruits. No JVD Lungs:Clear to ausculation bilaterally. No wheezes, rales, or rhonchi. Breathing is unlabored. Cardiovascular: RRR with S1 S2. + murmur Extremities: No edema.  Neuro: Alert and oriented. No focal deficits. No facial asymmetry. MAE spontaneously. Psych: Responds to questions appropriately with normal affect.    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 Most recent TEE with EF 30-35%, PMVL length: 0.9cm. Mean gradient: , Peak gradient: 10.76mmHg, Jet: broad central jet and small lateral. MR PISA radius: 0.70cm. MR PISA Eff ROA: 24mm2. Plan for mTEER with Randa Evens 08/31/22. Doing well today with NYHA class I symptoms. Pre procedure instructions reviewed with understanding. CHG soap given. Obtain CMET, CBC, BNP, INR today.   NICM: w/ persistent LVEF at 30-35% s/p CRT-D followed by AHF. Appears euvolemic on exam today although BNP elevated on pre procedure labs. Plan to treat with IV Lasix in the post procedure setting. Continue GDMT per AHF team.   Permanent AF s/p AV node ablation: Maintaining NSR. Continue Coumadin through 6/27 then she was instructed  to stop for mTEER. Will plan to resume post procedure.   Stage IV CKD: Cr slightly elevated above most recent baseline. Plan to re-check on 7/3 and follow trend.   DM2: Pre procedure insulin  instructions reviewed with the patient with understanding. Will cover with SSI while inpatient.   Moderate AS: Noted on TEE, echocardiogram with a mean gradient at and AVA by VTI at 1.93. Continue to follow closely.   Medication Adjustments/Labs and Tests Ordered: Current medicines are reviewed at length with the patient today.  Concerns regarding medicines are outlined above.  Orders Placed This Encounter  Procedures   Comprehensive metabolic panel   CBC   Pro b natriuretic peptide (BNP)   Protime-INR   EKG 12-Lead   No orders of the defined types were placed in this encounter.   Patient Instructions  Medication Instructions:  Your physician recommends that you continue on your current medications as directed. Please refer to the Current Medication list given to you today.  *If you need a refill on your cardiac medications before your next appointment, please call your pharmacy*   Lab Work: TODAY: CMET, CBC, BNP, INR If you have labs (blood work) drawn today and your tests are completely normal, you will receive your results only by: MyChart Message (if you have MyChart) OR A paper copy in the mail If you have any lab test that is abnormal or we need to change your treatment, we will call you to review the results.   Testing/Procedures: SEE INSTRUCTION LETTER, EKG   Follow-Up: At [facility], you and your health needs are our priority.  As part of our continuing mission to provide you with exceptional heart care, we have created designated Provider Care Teams.  These Care Teams include your primary Cardiologist (physician) and Advanced Practice Providers (APPs -  Physician Assistants and Nurse Practitioners) who all work together to provide you with the care you need, when you need it.  We recommend signing up for the patient portal called "MyChart".  Sign up information is provided on this After Visit Summary.  MyChart is used to connect with patients for  Virtual Visits (Telemedicine).  Patients are able to view lab/test results, encounter notes, upcoming appointments, etc.  Non-urgent messages can be sent to your provider as well.   To learn more about what you can do with MyChart, go to [email or web address].    Your next appointment:   POST PROCEDURAL FOLLOW-UP   Signed, Georgie Chard, NP  08/25/2022 2:01 PM    [facility]

## 2022-08-24 ENCOUNTER — Encounter: Payer: Self-pay | Admitting: Internal Medicine

## 2022-08-24 ENCOUNTER — Ambulatory Visit: Payer: PPO | Attending: Cardiology | Admitting: Cardiology

## 2022-08-24 VITALS — BP 122/72 | HR 89 | Ht 63.0 in | Wt 123.6 lb

## 2022-08-24 DIAGNOSIS — I5022 Chronic systolic (congestive) heart failure: Secondary | ICD-10-CM

## 2022-08-24 DIAGNOSIS — I10 Essential (primary) hypertension: Secondary | ICD-10-CM

## 2022-08-24 DIAGNOSIS — I4891 Unspecified atrial fibrillation: Secondary | ICD-10-CM

## 2022-08-24 DIAGNOSIS — E1169 Type 2 diabetes mellitus with other specified complication: Secondary | ICD-10-CM

## 2022-08-24 DIAGNOSIS — Q211 Atrial septal defect, unspecified: Secondary | ICD-10-CM

## 2022-08-24 DIAGNOSIS — Z01818 Encounter for other preprocedural examination: Secondary | ICD-10-CM

## 2022-08-24 DIAGNOSIS — Z794 Long term (current) use of insulin: Secondary | ICD-10-CM

## 2022-08-24 DIAGNOSIS — I340 Nonrheumatic mitral (valve) insufficiency: Secondary | ICD-10-CM

## 2022-08-24 DIAGNOSIS — Z95810 Presence of automatic (implantable) cardiac defibrillator: Secondary | ICD-10-CM

## 2022-08-24 DIAGNOSIS — E785 Hyperlipidemia, unspecified: Secondary | ICD-10-CM

## 2022-08-24 DIAGNOSIS — E118 Type 2 diabetes mellitus with unspecified complications: Secondary | ICD-10-CM

## 2022-08-24 LAB — COMPREHENSIVE METABOLIC PANEL
ALT: 6 IU/L (ref 0–32)
Alkaline Phosphatase: 109 IU/L (ref 44–121)
BUN/Creatinine Ratio: 19 (ref 12–28)
Globulin, Total: 2.9 g/dL (ref 1.5–4.5)
Glucose: 352 mg/dL — ABNORMAL HIGH (ref 70–99)
eGFR: 21 mL/min/{1.73_m2} — ABNORMAL LOW (ref >59)

## 2022-08-24 LAB — PROTIME-INR
INR: 1.4 — ABNORMAL HIGH (ref 0.9–1.2)
Prothrombin Time: 13.7 s — ABNORMAL HIGH (ref 9.1–12.0)

## 2022-08-24 LAB — PRO B NATRIURETIC PEPTIDE

## 2022-08-24 LAB — CBC

## 2022-08-24 NOTE — Patient Instructions (Signed)
Medication Instructions:  Your physician recommends that you continue on your current medications as directed. Please refer to the Current Medication list given to you today.  *If you need a refill on your cardiac medications before your next appointment, please call your pharmacy*   Lab Work: TODAY: CMET, CBC, BNP, INR If you have labs (blood work) drawn today and your tests are completely normal, you will receive your results only by: MyChart Message (if you have MyChart) OR A paper copy in the mail If you have any lab test that is abnormal or we need to change your treatment, we will call you to review the results.   Testing/Procedures: SEE INSTRUCTION LETTER, EKG   Follow-Up: At [facility], you and your health needs are our priority.  As part of our continuing mission to provide you with exceptional heart care, we have created designated Provider Care Teams.  These Care Teams include your primary Cardiologist (physician) and Advanced Practice Providers (APPs -  Physician Assistants and Nurse Practitioners) who all work together to provide you with the care you need, when you need it.  We recommend signing up for the patient portal called "MyChart".  Sign up information is provided on this After Visit Summary.  MyChart is used to connect with patients for Virtual Visits (Telemedicine).  Patients are able to view lab/test results, encounter notes, upcoming appointments, etc.  Non-urgent messages can be sent to your provider as well.   To learn more about what you can do with MyChart, go to [email or web address].    Your next appointment:   POST PROCEDURAL FOLLOW-UP

## 2022-08-25 LAB — COMPREHENSIVE METABOLIC PANEL
AST: 12 IU/L (ref 0–40)
Albumin: 4.4 g/dL (ref 3.8–4.8)
BUN: 45 mg/dL — ABNORMAL HIGH (ref 8–27)
Bilirubin Total: 0.8 mg/dL (ref 0.0–1.2)
CO2: 22 mmol/L (ref 20–29)
Calcium: 8.6 mg/dL — ABNORMAL LOW (ref 8.7–10.3)
Chloride: 91 mmol/L — ABNORMAL LOW (ref 96–106)
Creatinine, Ser: 2.37 mg/dL — ABNORMAL HIGH (ref 0.57–1.00)
Potassium: 4.3 mmol/L (ref 3.5–5.2)
Sodium: 133 mmol/L — ABNORMAL LOW (ref 134–144)
Total Protein: 7.3 g/dL (ref 6.0–8.5)

## 2022-08-25 LAB — CBC
Hemoglobin: 11.5 g/dL (ref 11.1–15.9)
MCH: 27.6 pg (ref 26.6–33.0)
Platelets: 335 10*3/uL (ref 150–450)
RBC: 4.16 x10E6/uL (ref 3.77–5.28)
WBC: 8.4 10*3/uL (ref 3.4–10.8)

## 2022-08-30 ENCOUNTER — Encounter: Payer: Self-pay | Admitting: Internal Medicine

## 2022-08-30 ENCOUNTER — Encounter (HOSPITAL_COMMUNITY): Payer: Self-pay | Admitting: Internal Medicine

## 2022-08-30 ENCOUNTER — Other Ambulatory Visit: Payer: Self-pay

## 2022-08-30 NOTE — Progress Notes (Addendum)
Spoke with pt for pre-op call. Pt has hx of CHF and complete Heart block. Pt has a defibrillator. [employer] rep has been paged. Pt is also diabetic. Pt states her fasting blood sugar is around 150-200. [name] last dose of Jardiance was 6/29. Instructed pt to take 1/2 of her regular dose of Toujeo insulin tonight. She will take 12 units. Instructed pt to check her blood sugar when she wakes up in the AM. If blood sugar is >220 take 1/2 of usual correction dose of Humalog insulin. If blood sugar is 70 or below, treat with 1/2 cup of clear juice (apple or cranberry) and recheck blood sugar 15 minutes after drinking juice. If blood sugar continues to be 70 or below, notify nurse on arrival to short stay.   [name] last dose of Coumadin was 08/25/22.  She received CHG soap from clinic and reviewed shower instructions with her.   11:06 addendum - Tobie Lords, [employer] rep has been notified and someone will be here in AM.

## 2022-08-30 NOTE — Progress Notes (Signed)
Anesthesia Chart Review: SAME DAY WORK-UP  Case: [id number] Date/Time: 08/31/22 0835   Procedures:      MITRAL VALVE REPAIR     TRANSESOPHAGEAL ECHOCARDIOGRAM   Anesthesia type: General   Diagnosis: Severe mitral insufficiency [I34.0]   Pre-op diagnosis: severe mitral insufficiency   [facility] (C[facility]IOLOGY) / [facility]   Providers: [name], MD       DISCUSSION: Patient is a 72 year old Emily [name] scheduled for the above procedure.   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   EP ICD recommendations: Device Information: Clinic EP Physician:  Lewayne Bunting, MD  Device Type:  Defibrillator Manufacturer and Phone #:  Medtronic: [phone] Pacemaker Dependent?:  Yes.   Date of Last Device Check:  08/15/2022     Normal Device Function?:  Yes.     Electrophysiologist's Recommendations: Have magnet available. Provide continuous ECG monitoring when magnet is used or reprogramming is to be performed.  Procedure will likely interfere with device function.  Device should be programmed:  Tachy therapies disabled Medtronic Rep notified.   She is a same day work-up, so labs and anesthesia team evaluation on th day of surgery. She has known CKD stage IV followed by nephrology. Historically, it does not appear that DM has been well controlled--A1c 11.3% on 12/28/21 (previously A1c ~ 8.8-10 since 03/2019).  She reported fasting CBGs ~ 150-200. Last Jardiance 08/27/22. She is also on insulin glargine and lispro. Last warfarin 08/25/22.    VS:  BP Readings from Last 3 Encounters:  08/24/22 122/72  07/22/22 110/78  07/15/22 132/85   Pulse Readings from Last 3 Encounters:  08/24/22 89  07/22/22 86  07/15/22 83     PROVIDERS: Glendale Chard, DO is PCP  Marca Ancona, MD is HF cardiologist Lewayne Bunting, MD is EP Alverda Skeans, MD is structural heart cardiologist Sabra Heck, MD is nephrologist   LABS: Most recent lab results in [facility] include: Lab Results  Component Value Date   WBC 8.4 08/24/2022   HGB 11.5 08/24/2022   HCT 37.3 08/24/2022   PLT 335 08/24/2022   GLUCOSE 352 (H) 08/24/2022   CHOL 157 12/28/2021   TRIG 174 (H) 12/28/2021   HDL 36 (L) 12/28/2021   LDLCALC 91 12/28/2021   ALT 6 08/24/2022   AST 12 08/24/2022   NA 133 (L) 08/24/2022   K 4.3 08/24/2022   CL 91 (L) 08/24/2022   CREATININE 2.37 (H) 08/24/2022   BUN 45 (H) 08/24/2022   CO2 22 08/24/2022   TSH 0.340 (L) 12/28/2021   INR 1.4 (H) 08/24/2022   HGBA1C 11.3 (A) 12/28/2021    IMAGES: CT Chest 12/29/21: IMPRESSION: - Minimal bibasilar subsegmental atelectasis is noted with small bilateral pleural effusions. - 3 mm nodule is noted in right upper lobe. No follow-up needed if patient is low-risk.This recommendation follows the consensus statement: Guidelines for Management of Incidental Pulmonary Nodules Detected on CT Images: From the Fleischner Society 2017; Radiology 2017; 284:228-243. - Aortic Atherosclerosis (ICD10-I70.0).   EKG: 08/24/22: Biventricular pacemaker detected Sinus rhythm  with complete heart block and Ventricular-paced rhythm When compared with ECG of 15-Jul-2022 09:47, Sinus rhythm is now with complete heart block Vent. rate has increased BY 7 BPM Confirmed by Emily Emily [name] ([id number]) on 08/24/2022 11:05:39 AM   CV: TTE (Limited) 08/22/22: 1. Akinesis of the anteroseptal,  anterior, anteroseptal wall from the  base to apex. Left ventricular ejection fraction, by estimation, is 30 to  35%. The left ventricle has moderately decreased function. The left  ventricle demonstrates global  hypokinesis. Left ventricular diastolic parameters are indeterminate.   2. Right ventricular systolic function is moderately reduced. The right  ventricular size is normal.   3. Functional MR. The mitral valve is grossly normal. Moderate mitral  valve regurgitation.   4. The inferior vena cava is dilated in size with >50% respiratory  variability, suggesting right atrial pressure of 8 mmHg.    RHC 07/15/22: 1. Normal filling pressures.  2. Prominent v-waves in PCWP tracing suggestive of significant mitral regurgitation.  3. Good cardiac output.  Compensated CHF with evidence for significant mitral regurgitation.    TEE 06/23/22:  1. Left ventricular ejection fraction, by estimation, is 30 to 35%. The  left ventricle has moderately decreased function. The left ventricle  demonstrates regional wall motion abnormalities with septal akinesis and  global hypokinesis of the other walls.  The left ventricular internal cavity size was mildly dilated.   2. Right ventricular systolic function is mildly reduced. The right  ventricular size is normal.   3. Left atrial size was mildly dilated. No left atrial/left atrial  appendage thrombus was detected. There was "smoke" noted in the left  atrial appendage.   4. Right atrial size was mildly dilated.   5. Small secundum ASD, 0.45 cm in diameter, area 0.123 cm^2.   6. Peak RV-RA gradient 18 mmHg. Tricuspid valve regurgitation is mild to  moderate.   7. The aortic valve is tricuspid. There is severe calcifcation of the  aortic valve. Aortic valve regurgitation is trivial. Mild to moderate  aortic valve stenosis. Aortic valve mean gradient measures 11.0 mmHg with  AVA 1.93 cm^2 by continuity equation.   8. The mitral valve is abnormal.  Moderate to severe mitral valve  regurgitation. There was a broad jet of mitral regurgitation (or 2 jets  close together) and a small jet more laterally. PISA ERO from the  broad/larger jet was 0.31 cm^2. Vena contracta  area 0.3 cm^2. No evidence of mitral stenosis (mean gradient 3 mmHg).   9. Grade 3 plaque in the thoracic aorta.     Long term monitor  04/25/22 - 05/02/22: 7 Ventricular Tachycardia runs occurred, the run with the fastest interval lasting 11 beats with a max rate of 167 bpm (avg 135 bpm); the run with the fastest interval was also the longest. Atrial Fibrillation occurred continuously (100% burden), ranging  from 50-103 bpm (avg of 67 bpm). Possible Ventricular Pacing. Isolated VEs were occasional (3.1%, 20296), VE Couplets were rare (<1.0%, 31), and VE Triplets were rare (<1.0%, 6).  Conclusion: 1. Predominantly atrial fibrillation 2. 7 NSVT runs, longest 11 beats.   3. 3.1% PVCs   LHC 12/17/20:   Ost Cx lesion is 30% stenosed.   Ramus lesion is 30% stenosed.   Ost LAD lesion is 30% stenosed.   Prox LAD lesion is 40% stenosed.   1. Mildly elevated PCWP with prominent v-wave in the pressure tracing.  2. Mild pulmonary venous hypertension.  3. Normal RA pressure.  4. Left => right atrial  level shunt with Qp/Qs 1.43.  5. Nonobstructive CAD (nonischemic cardiomyopathy).   Past Medical History:  Diagnosis Date   Acute pain of right shoulder 03/22/2019   Acute renal failure (HCC) 04/15/2012   Last Assessment & Plan:  Superimposed on CK D stage III, Baseline Cr ~2-2.5, remains above baseline, felt to be due to Cardiorenal syndrome, creatinine trending down at time of discharge Negative urine eos and no urinary retention ACE inhibitor discontinued Follows with Dr Julien Girt as outpatient, to have labs drawn 08/15/16 and results sent to [facility] office for review   AICD (automatic cardioverter/defibrillator) present    Anemia 12/29/2016   Arthritis    hands   Asthma     as a child   Atrial fibrillation with RVR (HCC)    Biventricular ICD (implantable cardioverter-defibrillator) in place    CHF (congestive heart failure) (HCC)    CKD (chronic kidney disease), stage IV (HCC)    Colitis 09/11/2012   Complete heart block (HCC)    Dyspnea    Essential hypertension    GERD (gastroesophageal reflux disease)    H/O viral myocarditis 12/29/2016   Headache    always has a headache   Hearing loss of left ear due to cerumen impaction 12/31/2019   Pt presented 11/2 with recent h/o left sided decreased hearing thought to be related to left lower tooth abscess s/p antibiotics. Physical exam revealed cerumen impaction of left ear canal. Relieved by flushing.    Hypomagnesemia 04/10/2016   Hyponatremia 04/10/2016   Last Assessment & Plan:  - improved after tolvaptan 6/29, chiefly driven by CHF, further improvement with ongoing IV Lasix - continue low Na and fluid restriction - compliance with sodium restriction a major barrier to keeping patient out of hospital and compensated, remains resistant to this philosophy while inpatient - follows with Dr Julien Girt as outpatient, Neph consult not indicated at this ti   Hypothyroidism    ICD (implantable cardioverter-defibrillator) battery depletion 04/21/2014   Localized macular rash 08/11/2016   Last Assessment & Plan:  Seems to trend with heart failure, though she has more findings behind bilateral knees and up into her thigh/groin area than when I last saw her Would use steroid cream for now, consider alternative diagnosis if no improvement   Long-term insulin use in type 2 diabetes (HCC) 04/10/2016   Last Assessment & Plan:  Hypoglycemia resolved, continue SSI and hypoglycemia protocol   Nonischemic cardiomyopathy (HCC)    On amiodarone therapy 08/21/2017   Persistent atrial fibrillation (HCC)    AV node ablation 2019   Pneumonia    PONV (postoperative nausea and vomiting)    has awakened during surgery   Restless leg  syndrome 03/22/2019   Type 2 diabetes mellitus (HCC)     Past Surgical History:  Procedure Laterality Date   ABLATION     AV NODE ABLATION N/A 08/08/2017   Procedure: AV NODE ABLATION;  Surgeon: Hillis Range, MD;  Location: [facility];  Service: Cardiovascular;  Laterality: N/A;   BREAST BIOPSY     CARDIAC DEFIBRILLATOR PLACEMENT  2005   MDT Viva XT CRT-D BiV ICD implanted by Dr Sharlot Gowda in [place]   CATARACT EXTRACTION Bilateral    CHOLECYSTECTOMY     ICD GENERATOR CHANGEOUT N/A 05/10/2021   Procedure: ICD GENERATOR CHANGEOUT;  Surgeon: Marinus Maw, MD;  Location[facility] INVASIVE CV LAB;  Service: Cardiovascular;  Laterality: N/A;   RIGHT HEART CATH N/A 04/28/2022   Procedure: RIGHT HEART CATH;  Surgeon: Laurey Morale, MD;  Location: [facility] INVASIVE CV LAB;  Service: Cardiovascular;  Laterality: N/A;   RIGHT HEART CATH N/A 07/15/2022   Procedure: RIGHT HEART CATH;  Surgeon: Laurey Morale, MD;  Location: [facility] INVASIVE CV LAB;  Service: Cardiovascular;  Laterality: N/A;   RIGHT HEART CATH AND CORONARY ANGIOGRAPHY N/A 12/17/2020   Procedure: RIGHT HEART CATH AND CORONARY ANGIOGRAPHY;  Surgeon: Laurey Morale, MD;  Location: [facility] INVASIVE CV LAB;  Service: Cardiovascular;  Laterality: N/A;   TEE WITHOUT CARDIOVERSION N/A 12/17/2020   Procedure: TRANSESOPHAGEAL ECHOCARDIOGRAM (TEE);  Surgeon: Laurey Morale, MD;  Location: [facility] ENDOSCOPY;  Service: Cardiovascular;  Laterality: N/A;   TEE WITHOUT CARDIOVERSION N/A 06/23/2022   Procedure: TRANSESOPHAGEAL ECHOCARDIOGRAM;  Surgeon: Laurey Morale, MD;  Location: [facility] INVASIVE CV LAB;  Service: Cardiovascular;  Laterality: N/A;   THYROIDECTOMY     TOTAL VAGINAL HYSTERECTOMY      MEDICATIONS: No current facility-administered medications for this encounter.    acetaminophen (TYLENOL) 500 MG tablet   allopurinol (ZYLOPRIM) 100 MG tablet   Calcium Carb-Cholecalciferol (CALCIUM + VITAMIN D3 PO)   empagliflozin (JARDIANCE) 10 MG  TABS tablet   furosemide (LASIX) 40 MG tablet   Glucosamine-Chondroitin (OSTEO BI-FLEX REGULAR STRENGTH PO)   insulin glargine, 2 Unit Dial, (TOUJEO MAX SOLOSTAR) 300 UNIT/ML Solostar Pen   insulin lispro (HUMALOG) 100 UNIT/ML injection   levothyroxine (SYNTHROID) 88 MCG tablet   loratadine (CLARITIN) 10 MG tablet   metoprolol succinate (TOPROL-XL) 100 MG 24 hr tablet   nitroGLYCERIN (NITROSTAT) 0.4 MG SL tablet   Omega-3 Fatty Acids (FISH OIL) 1200 MG CPDR   omeprazole (PRILOSEC) 40 MG capsule   sacubitril-valsartan (ENTRESTO) 24-26 MG   simvastatin (ZOCOR) 20 MG tablet   spironolactone (ALDACTONE) 25 MG tablet   warfarin (COUMADIN) 2 MG tablet   INSULIN SYRINGE .5CC/29G 29G X 1/2" 0.5 ML MISC    Shonna Chock, PA-C Surgical Short Stay/Anesthesiology [facility] Phone [phone] [facility] Phone [phone] 08/30/2022 5:00 PM

## 2022-08-30 NOTE — Progress Notes (Signed)
PERIOPERATIVE PRESCRIPTION FOR IMPLANTED CARDIAC DEVICE PROGRAMMING  Patient Information: Name:  Emily [name]  DOB:  [birth year]  MRN:  [id number]    Planned Procedure:  Mitral transcatheter  Surgeon:  Dr. Alverda Skeans  Date of Procedure:  08/31/22  Cautery will be used.  Position during surgery:  Supine   Please send documentation back to:  [facility] (Fax # [phone])  Device Information:  Clinic EP Physician:  Lewayne Bunting, MD   Device Type:  Defibrillator Manufacturer and Phone #:  Medtronic: [phone] Pacemaker Dependent?:  Yes.   Date of Last Device Check:  08/15/2022 Normal Device Function?:  Yes.    Electrophysiologist's Recommendations:  Have magnet available. Provide continuous ECG monitoring when magnet is used or reprogramming is to be performed.  Procedure will likely interfere with device function.  Device should be programmed:  Tachy therapies disabled  Per Device Clinic Standing Orders, Lenor Coffin, RN  8:27 AM 08/30/2022

## 2022-08-30 NOTE — Anesthesia Preprocedure Evaluation (Addendum)
Anesthesia Evaluation  Patient identified by MRN, date of birth, ID band Patient awake    Reviewed: Allergy & Precautions, NPO status , Patient's Chart, lab work & pertinent test results, reviewed documented beta blocker date and time   History of Anesthesia Complications (+) PONV and history of anesthetic complications  Airway Mallampati: II  TM Distance: >3 FB Neck ROM: Full    Dental  (+) Dental Advisory Given   Pulmonary asthma    Pulmonary exam normal        Cardiovascular hypertension, Pt. on home beta blockers and Pt. on medications + CAD and +CHF  Normal cardiovascular exam+ dysrhythmias Atrial Fibrillation + Cardiac Defibrillator + Valvular Problems/Murmurs MR and AS    CHB  '24 TTE - Akinesis of the anteroseptal, anterior, anteroseptal wall from the base to apex. EF is 30 to 35%. Global hypokinesis. Right ventricular systolic function is moderately reduced. Functional MR. Moderate mitral valve regurgitation. TEE previously documented moderate AS.     Neuro/Psych  Headaches  negative psych ROS   GI/Hepatic ,GERD  Medicated and Controlled,, Gilbert's disease    Endo/Other  diabetes, Type 2, Insulin Dependent, Oral Hypoglycemic AgentsHypothyroidism    Renal/GU CRFRenal disease     Musculoskeletal  (+) Arthritis ,    Abdominal   Peds  Hematology  On coumadin    Anesthesia Other Findings   Reproductive/Obstetrics                             Anesthesia Physical Anesthesia Plan  ASA: 4  Anesthesia Plan: General   Post-op Pain Management: Tylenol PO (pre-op)* and Minimal or no pain anticipated   Induction: Intravenous  PONV Risk Score and Plan: 4 or greater and Treatment may vary due to age or medical condition, Ondansetron and Dexamethasone  Airway Management Planned: Oral ETT  Additional Equipment: Arterial line  Intra-op Plan:   Post-operative Plan: Extubation in  OR  Informed Consent: I have reviewed the patients History and Physical, chart, labs and discussed the procedure including the risks, benefits and alternatives for the proposed anesthesia with the patient or authorized representative who has indicated his/her understanding and acceptance.     Dental advisory given  Plan Discussed with: CRNA and Anesthesiologist  Anesthesia Plan Comments: (Per EP recommendations, AICD to be reprogrammed prior to procedure)       Anesthesia Quick Evaluation

## 2022-08-31 ENCOUNTER — Inpatient Hospital Stay (HOSPITAL_COMMUNITY)
Admission: RE | Admit: 2022-08-31 | Discharge: 2022-08-31 | DRG: 307 | Disposition: A | Discharge status: Home / Self Care | Payer: PPO | Attending: Internal Medicine | Admitting: Internal Medicine

## 2022-08-31 ENCOUNTER — Inpatient Hospital Stay (HOSPITAL_COMMUNITY): Payer: PPO | Admitting: Vascular Surgery

## 2022-08-31 ENCOUNTER — Encounter (HOSPITAL_COMMUNITY)
Admission: RE | Disposition: A | Discharge status: Home / Self Care | Payer: Self-pay | Source: Home / Self Care | Attending: Internal Medicine

## 2022-08-31 ENCOUNTER — Inpatient Hospital Stay (HOSPITAL_COMMUNITY): Payer: PPO

## 2022-08-31 ENCOUNTER — Encounter (HOSPITAL_COMMUNITY): Payer: Self-pay | Admitting: Internal Medicine

## 2022-08-31 ENCOUNTER — Other Ambulatory Visit: Payer: Self-pay

## 2022-08-31 DIAGNOSIS — Q2111 Secundum atrial septal defect: Secondary | ICD-10-CM | POA: Diagnosis not present

## 2022-08-31 DIAGNOSIS — Z7901 Long term (current) use of anticoagulants: Secondary | ICD-10-CM

## 2022-08-31 DIAGNOSIS — Z9049 Acquired absence of other specified parts of digestive tract: Secondary | ICD-10-CM

## 2022-08-31 DIAGNOSIS — J45909 Unspecified asthma, uncomplicated: Secondary | ICD-10-CM | POA: Diagnosis present

## 2022-08-31 DIAGNOSIS — Z006 Encounter for examination for normal comparison and control in clinical research program: Secondary | ICD-10-CM | POA: Diagnosis not present

## 2022-08-31 DIAGNOSIS — Z1152 Encounter for screening for COVID-19: Secondary | ICD-10-CM

## 2022-08-31 DIAGNOSIS — I340 Nonrheumatic mitral (valve) insufficiency: Secondary | ICD-10-CM

## 2022-08-31 DIAGNOSIS — E7800 Pure hypercholesterolemia, unspecified: Secondary | ICD-10-CM | POA: Diagnosis present

## 2022-08-31 DIAGNOSIS — Z79899 Other long term (current) drug therapy: Secondary | ICD-10-CM

## 2022-08-31 DIAGNOSIS — I428 Other cardiomyopathies: Secondary | ICD-10-CM | POA: Diagnosis present

## 2022-08-31 DIAGNOSIS — K219 Gastro-esophageal reflux disease without esophagitis: Secondary | ICD-10-CM | POA: Diagnosis present

## 2022-08-31 DIAGNOSIS — N184 Chronic kidney disease, stage 4 (severe): Secondary | ICD-10-CM

## 2022-08-31 DIAGNOSIS — I2510 Atherosclerotic heart disease of native coronary artery without angina pectoris: Secondary | ICD-10-CM | POA: Diagnosis present

## 2022-08-31 DIAGNOSIS — I130 Hypertensive heart and chronic kidney disease with heart failure and stage 1 through stage 4 chronic kidney disease, or unspecified chronic kidney disease: Secondary | ICD-10-CM

## 2022-08-31 DIAGNOSIS — Z8249 Family history of ischemic heart disease and other diseases of the circulatory system: Secondary | ICD-10-CM

## 2022-08-31 DIAGNOSIS — E1122 Type 2 diabetes mellitus with diabetic chronic kidney disease: Secondary | ICD-10-CM | POA: Diagnosis present

## 2022-08-31 DIAGNOSIS — I4821 Permanent atrial fibrillation: Secondary | ICD-10-CM | POA: Diagnosis present

## 2022-08-31 DIAGNOSIS — Z538 Procedure and treatment not carried out for other reasons: Secondary | ICD-10-CM | POA: Diagnosis present

## 2022-08-31 DIAGNOSIS — Z885 Allergy status to narcotic agent status: Secondary | ICD-10-CM

## 2022-08-31 DIAGNOSIS — Z90710 Acquired absence of both cervix and uterus: Secondary | ICD-10-CM

## 2022-08-31 DIAGNOSIS — G2581 Restless legs syndrome: Secondary | ICD-10-CM | POA: Diagnosis present

## 2022-08-31 DIAGNOSIS — M1990 Unspecified osteoarthritis, unspecified site: Secondary | ICD-10-CM | POA: Diagnosis present

## 2022-08-31 DIAGNOSIS — I5023 Acute on chronic systolic (congestive) heart failure: Secondary | ICD-10-CM

## 2022-08-31 DIAGNOSIS — Z825 Family history of asthma and other chronic lower respiratory diseases: Secondary | ICD-10-CM

## 2022-08-31 DIAGNOSIS — Z794 Long term (current) use of insulin: Secondary | ICD-10-CM | POA: Diagnosis not present

## 2022-08-31 DIAGNOSIS — Z7984 Long term (current) use of oral hypoglycemic drugs: Secondary | ICD-10-CM

## 2022-08-31 DIAGNOSIS — E890 Postprocedural hypothyroidism: Secondary | ICD-10-CM | POA: Diagnosis present

## 2022-08-31 DIAGNOSIS — E785 Hyperlipidemia, unspecified: Secondary | ICD-10-CM | POA: Diagnosis present

## 2022-08-31 DIAGNOSIS — I10 Essential (primary) hypertension: Secondary | ICD-10-CM | POA: Diagnosis present

## 2022-08-31 DIAGNOSIS — Z95810 Presence of automatic (implantable) cardiac defibrillator: Secondary | ICD-10-CM

## 2022-08-31 DIAGNOSIS — I4891 Unspecified atrial fibrillation: Secondary | ICD-10-CM | POA: Diagnosis present

## 2022-08-31 DIAGNOSIS — Z888 Allergy status to other drugs, medicaments and biological substances status: Secondary | ICD-10-CM

## 2022-08-31 DIAGNOSIS — I5022 Chronic systolic (congestive) heart failure: Secondary | ICD-10-CM | POA: Diagnosis present

## 2022-08-31 DIAGNOSIS — Z91041 Radiographic dye allergy status: Secondary | ICD-10-CM

## 2022-08-31 DIAGNOSIS — I442 Atrioventricular block, complete: Secondary | ICD-10-CM | POA: Diagnosis present

## 2022-08-31 HISTORY — DX: Nausea with vomiting, unspecified: R11.2

## 2022-08-31 HISTORY — DX: Gastro-esophageal reflux disease without esophagitis: K21.9

## 2022-08-31 HISTORY — DX: Hypothyroidism, unspecified: E03.9

## 2022-08-31 HISTORY — DX: Dyspnea, unspecified: R06.00

## 2022-08-31 HISTORY — DX: Presence of automatic (implantable) cardiac defibrillator: Z95.810

## 2022-08-31 HISTORY — DX: Other specified postprocedural states: Z98.890

## 2022-08-31 HISTORY — PX: TEE WITHOUT CARDIOVERSION: SHX5443

## 2022-08-31 HISTORY — DX: Unspecified asthma, uncomplicated: J45.909

## 2022-08-31 HISTORY — DX: Pneumonia, unspecified organism: J18.9

## 2022-08-31 HISTORY — DX: Headache, unspecified: R51.9

## 2022-08-31 HISTORY — DX: Unspecified osteoarthritis, unspecified site: M19.90

## 2022-08-31 HISTORY — DX: Nonrheumatic aortic (valve) stenosis: I35.0

## 2022-08-31 HISTORY — PX: MITRAL VALVE REPAIR: CATH118311

## 2022-08-31 HISTORY — DX: Atrial septal defect, unspecified: Q21.10

## 2022-08-31 HISTORY — DX: Nonrheumatic mitral (valve) insufficiency: I34.0

## 2022-08-31 LAB — GLUCOSE, CAPILLARY
Glucose-Capillary: 279 mg/dL — ABNORMAL HIGH (ref 70–99)
Glucose-Capillary: 307 mg/dL — ABNORMAL HIGH (ref 70–99)
Glucose-Capillary: 158 mg/dL — ABNORMAL HIGH (ref 70–99)
Glucose-Capillary: 163 mg/dL — ABNORMAL HIGH (ref 70–99)

## 2022-08-31 LAB — ABO/RH: ABO/RH(D): O NEG

## 2022-08-31 LAB — POCT ACTIVATED CLOTTING TIME
Activated Clotting Time: 226 seconds
Activated Clotting Time: 244 seconds
Activated Clotting Time: 269 seconds
Activated Clotting Time: 244 seconds

## 2022-08-31 LAB — SARS CORONAVIRUS 2 BY RT PCR: SARS Coronavirus 2 by RT PCR: NEGATIVE

## 2022-08-31 LAB — PROTIME-INR
INR: 1.1 (ref 0.8–1.2)
Prothrombin Time: 14.2 seconds (ref 11.4–15.2)

## 2022-08-31 LAB — SURGICAL PCR SCREEN
MRSA, PCR: NEGATIVE
Staphylococcus aureus: POSITIVE — AB

## 2022-08-31 LAB — TYPE AND SCREEN
ABO/RH(D): O NEG
Antibody Screen: NEGATIVE

## 2022-08-31 SURGERY — MITRAL VALVE REPAIR
Anesthesia: General

## 2022-08-31 MED ORDER — FENTANYL CITRATE (PF) 250 MCG/5ML IJ SOLN
INTRAMUSCULAR | Status: DC | PRN
  Administered 2022-08-31: 50 ug via INTRAVENOUS

## 2022-08-31 MED ORDER — ONDANSETRON HCL 4 MG/2ML IJ SOLN
INTRAMUSCULAR | Status: DC | PRN
  Administered 2022-08-31: 4 mg via INTRAVENOUS

## 2022-08-31 MED ORDER — LIDOCAINE 2% (20 MG/ML) 5 ML SYRINGE
INTRAMUSCULAR | Status: DC | PRN
  Administered 2022-08-31: 40 mg via INTRAVENOUS

## 2022-08-31 MED ORDER — ONDANSETRON HCL 4 MG/2ML IJ SOLN: 4.0000 mg | Freq: Four times a day (QID) | INTRAMUSCULAR | Status: DC | PRN

## 2022-08-31 MED ORDER — CHLORHEXIDINE GLUCONATE 0.12 % MT SOLN
15.0000 mL | Freq: Once | OROMUCOSAL | Status: AC
  Administered 2022-08-31: 15 mL via OROMUCOSAL
  Filled 2022-08-31: qty 15

## 2022-08-31 MED ORDER — ACETAMINOPHEN 500 MG PO TABS
1000.0000 mg | ORAL_TABLET | Freq: Once | ORAL | Status: AC
  Administered 2022-08-31: 1000 mg via ORAL
  Filled 2022-08-31: qty 2

## 2022-08-31 MED ORDER — MUPIROCIN 2 % EX OINT
1.0000 | TOPICAL_OINTMENT | Freq: Once | CUTANEOUS | Status: AC
  Administered 2022-08-31: 1 via TOPICAL
  Filled 2022-08-31: qty 22

## 2022-08-31 MED ORDER — INSULIN ASPART 100 UNIT/ML IJ SOLN
0.0000 [IU] | INTRAMUSCULAR | Status: AC | PRN
  Administered 2022-08-31: 5 [IU] via SUBCUTANEOUS
  Administered 2022-08-31: 4 [IU] via SUBCUTANEOUS

## 2022-08-31 MED ORDER — HEPARIN SODIUM (PORCINE) 1000 UNIT/ML IJ SOLN
INTRAMUSCULAR | Status: DC | PRN
  Administered 2022-08-31: 3000 [IU] via INTRAVENOUS
  Administered 2022-08-31: 2000 [IU] via INTRAVENOUS
  Administered 2022-08-31: 7000 [IU] via INTRAVENOUS

## 2022-08-31 MED ORDER — LIDOCAINE-EPINEPHRINE 1 %-1:100000 IJ SOLN
INTRAMUSCULAR | Status: AC
  Filled 2022-08-31: qty 1

## 2022-08-31 MED ORDER — DEXAMETHASONE SODIUM PHOSPHATE 10 MG/ML IJ SOLN
INTRAMUSCULAR | Status: DC | PRN
  Administered 2022-08-31: 4 mg via INTRAVENOUS

## 2022-08-31 MED ORDER — CEFAZOLIN SODIUM-DEXTROSE 2-4 GM/100ML-% IV SOLN
2.0000 g | INTRAVENOUS | Status: AC
  Administered 2022-08-31: 2 g via INTRAVENOUS
  Filled 2022-08-31: qty 100

## 2022-08-31 MED ORDER — PROPOFOL 10 MG/ML IV BOLUS
INTRAVENOUS | Status: DC | PRN
  Administered 2022-08-31: 50 mg via INTRAVENOUS

## 2022-08-31 MED ORDER — CHLORHEXIDINE GLUCONATE 4 % EX SOLN: 60.0000 mL | Freq: Once | CUTANEOUS | Status: DC

## 2022-08-31 MED ORDER — PHENYLEPHRINE 80 MCG/ML (10ML) SYRINGE FOR IV PUSH (FOR BLOOD PRESSURE SUPPORT)
PREFILLED_SYRINGE | INTRAVENOUS | Status: DC | PRN
  Administered 2022-08-31 (×2): 80 ug via INTRAVENOUS

## 2022-08-31 MED ORDER — ROCURONIUM BROMIDE 10 MG/ML (PF) SYRINGE
PREFILLED_SYRINGE | INTRAVENOUS | Status: DC | PRN
  Administered 2022-08-31: 20 mg via INTRAVENOUS
  Administered 2022-08-31: 50 mg via INTRAVENOUS

## 2022-08-31 MED ORDER — LACTATED RINGERS IV SOLN: INTRAVENOUS | Status: DC

## 2022-08-31 MED ORDER — SUGAMMADEX SODIUM 200 MG/2ML IV SOLN
INTRAVENOUS | Status: DC | PRN
  Administered 2022-08-31: 200 mg via INTRAVENOUS

## 2022-08-31 MED ORDER — PHENYLEPHRINE HCL-NACL 20-0.9 MG/250ML-% IV SOLN
INTRAVENOUS | Status: DC | PRN
  Administered 2022-08-31: 25 ug/min via INTRAVENOUS

## 2022-08-31 MED ORDER — INSULIN ASPART 100 UNIT/ML IJ SOLN
INTRAMUSCULAR | Status: AC
  Filled 2022-08-31: qty 1

## 2022-08-31 MED ORDER — SODIUM CHLORIDE 0.9 % IV SOLN: INTRAVENOUS | Status: DC

## 2022-08-31 MED ORDER — HEPARIN (PORCINE) IN NACL 2000-0.9 UNIT/L-% IV SOLN
INTRAVENOUS | Status: DC | PRN
  Administered 2022-08-31: 1000 mL

## 2022-08-31 MED ORDER — HEPARIN (PORCINE) IN NACL 1000-0.9 UT/500ML-% IV SOLN
INTRAVENOUS | Status: DC | PRN
  Administered 2022-08-31 (×2): 500 mL

## 2022-08-31 MED ORDER — FENTANYL CITRATE (PF) 100 MCG/2ML IJ SOLN
INTRAMUSCULAR | Status: AC
  Filled 2022-08-31: qty 2

## 2022-08-31 SURGICAL SUPPLY — 18 items
CLOSURE PERCLOSE PROSTYLE (VASCULAR PRODUCTS) IMPLANT
DEVICE RAD COMP TR BAND LRG (VASCULAR PRODUCTS) IMPLANT
DILATOR VESSEL 38 20CM 16FR (INTRODUCER) IMPLANT
ELECT DEFIB PAD ADLT CADENCE (PAD) IMPLANT
KIT HEART LEFT (KITS) ×2 IMPLANT
KIT VERSACROSS LRG ACCESS (CATHETERS) IMPLANT
PACK CARDIAC CATHETERIZATION (CUSTOM PROCEDURE TRAY) ×1 IMPLANT
RAIL STABILIZER SYS PASCAL (SYSTAGENIX WOUND MANAGEMENT) IMPLANT
SHEATH PASCAL GUIDE 22F (SHEATH) IMPLANT
SHEATH PINNACLE 8F 10CM (SHEATH) IMPLANT
SHEATH PROBE COVER 6X72 (BAG) ×1 IMPLANT
STOPCOCK MORSE 400PSI 3WAY (MISCELLANEOUS) ×6 IMPLANT
SYSTEM PASCAL MITRAL REPAIR (SYSTAGENIX WOUND MANAGEMENT) IMPLANT
TRANSDUCER W/STOPCOCK (MISCELLANEOUS) ×1 IMPLANT
TUBING ART PRESS 72 MALE/FEM (TUBING) ×1 IMPLANT
WIRE .035 3MM-J 145CM (WIRE) IMPLANT
WIRE J 3MM .035X260CM (WIRE) IMPLANT
WIRE MICRO SET SILHO 5FR 7 (SHEATH) IMPLANT

## 2022-08-31 NOTE — Interval H&P Note (Signed)
History and Physical Interval Note:  08/31/2022 6:43 AM  Emily [name]  has presented today for surgery, with the diagnosis of severe mitral insufficiency.  The various methods of treatment have been discussed with the patient and family. After consideration of risks, benefits and other options for treatment, the patient has consented to  Procedure(s): MITRAL VALVE REPAIR (N/A) TRANSESOPHAGEAL ECHOCARDIOGRAM (N/A) as a surgical intervention.  The patient's history has been reviewed, patient examined, no change in status, stable for surgery.  I have reviewed the patient's chart and labs.  Questions were answered to the patient's satisfaction.     [name]

## 2022-08-31 NOTE — Progress Notes (Signed)
  [facility]   MULTIDISCIPLINARY HEART VALVE TEAM  Called by RN due to slow right groin ooze post aborted mTEER procedure earlier today. RN at bedside who held manual pressure for @ 20 minutes. 3ml of Lido/Epi 1:100000 injected at site with improvement. Plan for additional 2 hours of bedrest post injection. She wishes to speak with Dr. Lynnette Caffey prior to discharge. Notified him of this with plan to see her after OP clinic later today.   Emily Chard NP-C Structural Heart Team  Pager: [phone] Phone: [phone]

## 2022-08-31 NOTE — Anesthesia Postprocedure Evaluation (Signed)
Anesthesia Post Note  Patient: Emily [name]  Procedure(s) Performed: MITRAL VALVE REPAIR TRANSESOPHAGEAL ECHOCARDIOGRAM     Patient location during evaluation: PACU Anesthesia Type: General Level of consciousness: awake and alert Pain management: pain level controlled Vital Signs Assessment: post-procedure vital signs reviewed and stable Respiratory status: spontaneous breathing, nonlabored ventilation and respiratory function stable Cardiovascular status: blood pressure returned to baseline and stable Postop Assessment: no apparent nausea or vomiting Anesthetic complications: no   There were no known notable events for this encounter.  Last Vitals:  Vitals:   08/31/22 1210 08/31/22 1222  BP: (!) 142/74 135/73  Pulse: 79 80  Resp: 12 16  Temp: (!) 36.2 C   SpO2: 100% 100%    Last Pain:  Vitals:   08/31/22 1124  TempSrc:   PainSc: 0-No pain                 Beryle Lathe

## 2022-08-31 NOTE — Anesthesia Procedure Notes (Signed)
Procedure Name: Intubation Date/Time: 08/31/2022 9:09 AM  Performed by: Garfield Cornea, CRNAPre-anesthesia Checklist: Patient identified, Emergency Drugs available, Suction available and Patient being monitored Patient Re-evaluated:Patient Re-evaluated prior to induction Oxygen Delivery Method: Circle System Utilized Preoxygenation: Pre-oxygenation with 100% oxygen Induction Type: IV induction Ventilation: Mask ventilation without difficulty and Oral airway inserted - appropriate to patient size Laryngoscope Size: Mac and 3 Grade View: Grade II Tube type: Oral Tube size: 7.0 mm Number of attempts: 1 Airway Equipment and Method: Stylet and Oral airway Placement Confirmation: ETT inserted through vocal cords under direct vision, positive ETCO2 and breath sounds checked- equal and bilateral Secured at: 21 cm Tube secured with: Tape Dental Injury: Teeth and Oropharynx as per pre-operative assessment

## 2022-08-31 NOTE — Op Note (Signed)
[facility]   MULTIDISCIPLINARY HEART TEAM  Date of Procedure:  08/31/2022  Preoperative Diagnosis: Severe Symptomatic Mitral Regurgitation (Stage D)  Postoperative Diagnosis: Mild Mitral Regurgitation   Procedure Performed: Ultrasound-guided right transfemoral venous access Double PreClose right femoral vein Transseptal puncture using Bailess RF needle Aborted Mitral edge to edge repair with PASCAL ACE  Surgeon: Alverda Skeans, MD Co-Surgeon: Micheline Chapman, MD Echocardiographer: Charlton Haws, MD  Anesthesiologist: Leslye Peer, MD  Device Implant: Aborted PASCAL ACE   Procedural Indication: Severe Non-rheumatic Mitral Regurgitation (Stage D)   Brief History: The patient is 72 year old female with a history of nonischemic cardiomyopathy ejection fraction of 30 to 35% status post CRT-D, permanent atrial fibrillation status post AV node ablation and permanent pacemaker, chronic anticoagulation with Coumadin, type 2 diabetes, stage IV kidney disease, hypertension, hyperlipidemia, moderate aortic stenosis, and severe functional mitral regurgitation was referred for elective mitral transcatheter edge-to-edge repair with a PASCAL ACE device.  NYHA class: 2  Echo Findings: Preop:  Severe LV systolic dysfunction Severe MR secondary to functional mitral regurgitation, Grade 4+ Post-op:  Unchanged LV systolic function Mild residual MR  Procedural Details: Prep The patient is brought to the cardiac catheterization lab in the fasting state. General anesthesia is induced. The patient is prepped from the groin to chin. A foley catheter is placed. Hemodynamics are monitored via a radial artery line.   Venous Access Using ultrasound guidance, the right femoral vein is punctured. Ultrasound images are captured and stored in the patient's chart. The vein is dilated and 2 Perclose devices are deplyed at 10' and 2' positions to 'Preclose' the femoral vein. An 8 Fr sheath is  inserted.  Transseptal Puncture A [place] Versacross wire is advanced into the SVC A [place] transseptal dilator is advanced into the SVC, and the VersaCross RF wire is retracted into the dilator  The transseptal sheath is retracted into the RA under fluoroscopic and echo guidance to obtain position on the posterior fossa where echo measurements are made to assure appropriate access to the mitral valve. Once proper position is confirmed by echo, RF energy is delivered and the VersaCross wire is advanced into the LA without resistance. The dilator and sheath are advanced over the wire where proper position is confirmed by echo and pressure measurement Weight based IV heparin is administered and a therapeutic ACT > 250 is confirmed  Guide Catheter Insertion The VersaCross wire is positioned at the left upper pulmonary vein The femoral vein is progressively dilated and the 22 Fr guide catheter is inserted and then directed across the interatrial septum over the wire. Position is confirmed approximately 2 cm into the left atrium.  The dilator and wire is then removed and the guide catheter is locked into place on the stabilizer rail platform system.  PASCAL ACE Insertion The PASCAL ACE is prepped per protocol and inserted via the introducer into the guide catheter The implant system (consisting of the steerable and implant catheters) is through the loader, the loader is removed, and 45cc of blood is aspirated and the system is flushed with an equal volume of saline. The implant system is advanced under fluoro and echo guidance so that it exits the guide catheter into the left atrium.  The device is then closed and the clasps are retracted.  PASCAL ACE Positioning in the Left Atrium (Supravalvular Alignment) Using flexion and torquing of the steerable catheter as well as advancement and retraction of the guide catheter, the device is centered above the target  of therapy and with an optimized  trajectory 2D and 3D TEE imaging is performed in multiple planes and the device is positioned and aligned above the valve using standard steering techniques   Entry into the Left Ventricle and Mitral Valve Leaflet Grasp The device is opened to the capture-ready configuration The clasps are then independently lowered to test functionality The device is advanced across the mitral valve into the LV, maintaining proper orientation The Clip arms are opened to 180 degrees and the device is slowly retracted  Capture of both the anterior and posterior leaflets are visualized by echo and the clasps are dropped  Procedure conclusion After multiple grasping attempts in the medial aspect of the valve and the A3 P3 region is well as closer to the central aspect of the valve with multiple changes in trajectory and orientation every grasp which demonstrated good leaflet insertion resulted in a significant increase in V wave amplitude and mitral regurgitation by color Doppler.  Eventually we suspected that the dense secondary chordae network was being captured in the device and distorting valve function.  After multiple attempts we decided to defer further attempts.  Of note due to the effects of anesthesia the patient's mitral regurgitation was thought to be mild at the conclusion of the procedure.  Device Removal The implant system is removed under echo guidance The guide catheter is retracted into the right atrium and the interatrial septum is assessed by echo without evidence of right-to-left shunting or large ASD  Hemostasis The guide catheter is removed over a 0.035" wire and the Perclose sutures are tightened with complete hemostasis and no evidence of hematoma  Estimated blood loss: minimal  There are no immediate procedural complications. The patient is transferred to the post-procedure recovery area in stable condition.   [name] 08/31/2022 2:45 PM

## 2022-08-31 NOTE — Op Note (Signed)
    PROCEDURE:  Transcatheter edge to edge mitral valve repair (TEER) INDICATION: Severe symptomatic mitral regurgitation (Stage D)  SURGEON:  Alverda Skeans, MD  CO-SURGEON: Tonny Bollman, MD   PROCEDURAL DETAILS: General anesthesia is induced.  The patient is prepped and draped.  Baseline transesophageal echo images are obtained and confirm appropriate anatomy for transcatheter edge-to-edge mitral valve repair.  Using vascular ultrasound guidance, the right common femoral vein is accessed via a front wall puncture.  2 Perclose sutures are deployed and an 8 [place] sheath is inserted.  A versa cross wire is advanced into the SVC.  Heparin is administered and a therapeutic ACT is achieved.  Transseptal puncture is performed over the mid posterior portion of the fossa.  The septum is dilated while the steerable guide catheter is prepped.  After progressively dilating the right common femoral vein, the 22 [place] guide catheter is inserted and advanced across the interatrial septum.  The dilator and the versa cross wire were carefully removed and the guide tip is appropriately positioned approximately 2 cm across the interatrial septum.  A PASCAL ACE device is prepped per protocol.  The implant system consisting of the PASCAL device, steerable catheter, and implant catheters is inserted through the guide catheter.  The PASCAL ACE device is positioned above the mitral valve after applying appropriate manuevers to the guide and steerable catheters.  The PASCAL ACE device is then placed in the capture ready position and oriented coaxially with the anterior and posterior leaflets of the mitral valve.   The clip device is advanced across the mitral valve and pulled back until capture of both the anterior and posterior leaflets is achieved.  Clasps are dropped and the device is closed under TEE guidance.  Careful TEE assessment is performed. Despite good leaflet capture and multiple attempts over the pathology on the  medial and central portions of the mitral valve, we were never able to achieve adequate reduction in MR. We suspected chordal interaction, as V wave amplitude increased with each grasp and MR worsened.  After multiple attempts, changes in position and trajectory, and changes in clasp orientation, the procedure was terminated and the pascal device is removed.   PROCEDURE COMPLETION: The guide catheter is pulled back into the right atrium and the interatrial septum is assessed with TEE.  There is no significant septal injury seen and no right to left shunting.  The guide catheter is removed and the Perclose sutures are tightened.    CONCLUSION: Aborted transcatheter edge-to-edge mitral valve repair under fluoroscopic and echo guidance, with inability to reduce degree of mitral regurgitation despite multiple adequate leaflet grasps.   Tonny Bollman 08/31/2022 11:29 PM

## 2022-08-31 NOTE — Transfer of Care (Signed)
Immediate Anesthesia Transfer of Care Note  Patient: Emily [name]  Procedure(s) Performed: MITRAL VALVE REPAIR TRANSESOPHAGEAL ECHOCARDIOGRAM  Patient Location: Cath Lab  Anesthesia Type:General  Level of Consciousness: drowsy  Airway & Oxygen Therapy: Patient Spontanous Breathing and Patient connected to nasal cannula oxygen  Post-op Assessment: Report given to RN and Post -op Vital signs reviewed and stable  Post vital signs: Reviewed and stable  Last Vitals:  Vitals Value Taken Time  BP 153/78 08/31/22 1137  Temp    Pulse 80 08/31/22 1139  Resp 16 08/31/22 1140  SpO2 94 % 08/31/22 1139  Vitals shown include unvalidated device data.  Last Pain:  Vitals:   08/31/22 0906  TempSrc:   PainSc: 0-No pain         Complications: There were no known notable events for this encounter.

## 2022-08-31 NOTE — Discharge Instructions (Addendum)
Femoral Site Care This sheet gives you information about how to care for yourself after your procedure. Your health care provider may also give you more specific instructions. If you have problems or questions, contact your health care provider. What can I expect after the procedure?  After the procedure, it is common to have: Bruising that usually fades within 1-2 weeks. Tenderness at the site. Follow these instructions at home: Wound care Follow instructions from your health care provider about how to take care of your insertion site. Make sure you: Wash your hands with soap and water before you change your bandage (dressing). If soap and water are not available, use hand sanitizer. Remove your dressing as told by your health care provider. 24 hours Do not take baths, swim, or use a hot tub until your health care provider approves. You may shower 24-48 hours after the procedure or as told by your health care provider. Gently wash the site with plain soap and water. Pat the area dry with a clean towel. Do not rub the site. This may cause bleeding. Do not apply powder or lotion to the site. Keep the site clean and dry. Check your femoral site every day for signs of infection. Check for: Redness, swelling, or pain. Fluid or blood. Warmth. Pus or a bad smell. Activity For the first 2-3 days after your procedure, or as long as directed: Avoid climbing stairs as much as possible. Do not squat. Do not lift anything that is heavier than 5 lb, or the limit that you are told, until your health care provider says that it is safe. For 5 days Rest as directed. Avoid sitting for a long time without moving. Get up to take short walks every 1-2 hours. Do not drive for 24 hours if you were given a medicine to help you relax (sedative). General instructions Take over-the-counter and prescription medicines only as told by your health care provider. Keep all follow-up visits as told by your health  care provider. This is important. Drink plenty of fluids. Contact a health care provider if you have: A fever or chills. You have redness, swelling, or pain around your insertion site. Get help right away if: The catheter insertion area swells very fast. You pass out. You suddenly start to sweat or your skin gets clammy. The catheter insertion area is bleeding, and the bleeding does not stop when you hold steady pressure on the area. These symptoms may represent a serious problem that is an emergency. Do not wait to see if the symptoms will go away. Get medical help right away. Call your local emergency services (911 in the [place] Do not drive yourself to the hospital. Summary After the procedure, it is common to have bruising that usually fades within 1-2 weeks. Check your femoral site every day for signs of infection. Do not lift anything that is heavier than 5 lbs, or the limit that you are told, until your health care provider says that it is safe. This information is not intended to replace advice given to you by your health care provider. Make sure you discuss any questions you have with your health care provider. Document Revised: 02/27/2017 Document Reviewed: 02/27/2017 Elsevier Patient Education  2020 [employer]     Radial Site Care  This sheet gives you information about how to care for yourself after your procedure. Your health care provider may also give you more specific instructions. If you have problems or questions, contact your health care provider. What  can I expect after the procedure? After the procedure, it is common to have: Bruising and tenderness at the catheter insertion area. Follow these instructions at home: Medicines Take over-the-counter and prescription medicines only as told by your health care provider. Insertion site care Follow instructions from your health care provider about how to take care of your insertion site. Make sure you: Wash your  hands with soap and water before you remove your bandage (dressing). If soap and water are not available, use hand sanitizer. May remove dressing in 24 hours. Check your insertion site every day for signs of infection. Check for: Redness, swelling, or pain. Fluid or blood. Pus or a bad smell. Warmth. Do no take baths, swim, or use a hot tub for 5 days. You may shower 24-48 hours after the procedure. Remove the dressing and gently wash the site with plain soap and water. Pat the area dry with a clean towel. Do not rub the site. That could cause bleeding. Do not apply powder or lotion to the site. Activity  For 24 hours after the procedure, or as directed by your health care provider: Do not flex or bend the affected arm. Do not push or pull heavy objects with the affected arm. Do not drive yourself home from the hospital or clinic. You may drive 24 hours after the procedure. Do not operate machinery or power tools. KEEP ARM ELEVATED THE REMAINDER OF THE DAY. Do not push, pull or lift anything that is heavier than 10 lb for 5 days. Ask your health care provider when it is okay to: Return to work or school. Resume usual physical activities or sports. Resume sexual activity. General instructions If the catheter site starts to bleed, raise your arm and put firm pressure on the site. If the bleeding does not stop, get help right away. This is a medical emergency. DRINK PLENTY OF FLUIDS FOR THE NEXT 2-3 DAYS. No alcohol consumption for 24 hours after receiving sedation. If you went home on the same day as your procedure, a responsible adult should be with you for the first 24 hours after you arrive home. Keep all follow-up visits as told by your health care provider. This is important. Contact a health care provider if: You have a fever. You have redness, swelling, or yellow drainage around your insertion site. Get help right away if: You have unusual pain at the radial site. The  catheter insertion area swells very fast. The insertion area is bleeding, and the bleeding does not stop when you hold steady pressure on the area. Your arm or hand becomes pale, cool, tingly, or numb. These symptoms may represent a serious problem that is an emergency. Do not wait to see if the symptoms will go away. Get medical help right away. Call your local emergency services (911 in the [place] Do not drive yourself to the hospital. Summary After the procedure, it is common to have bruising and tenderness at the site. Follow instructions from your health care provider about how to take care of your radial site wound. Check the wound every day for signs of infection.  This information is not intended to replace advice given to you by your health care provider. Make sure you discuss any questions you have with your health care provider. Document Revised: 03/22/2017 Document Reviewed: 03/22/2017 Elsevier Patient Education  2020 [employer].

## 2022-09-02 ENCOUNTER — Encounter (HOSPITAL_COMMUNITY): Payer: Self-pay | Admitting: Internal Medicine

## 2022-09-02 ENCOUNTER — Telehealth: Payer: Self-pay | Admitting: Cardiology

## 2022-09-02 MED FILL — Fentanyl Citrate Preservative Free (PF) Inj 100 MCG/2ML: INTRAMUSCULAR | Qty: 2 | Status: AC

## 2022-09-02 MED FILL — Lidocaine Inj 1% w/ Epinephrine-1:100000: INTRAMUSCULAR | Qty: 20 | Status: AC

## 2022-09-02 NOTE — Telephone Encounter (Signed)
  [facility]   MULTIDISCIPLINARY HEART VALVE TEAM   Patient contacted regarding discharge from [facility] on 08/31/22 after aborted mTEER procedure. Reports that she has done well with no groin issues. Discussed the need for follow up INR check with the [facility] preferably 7/8 or 7/9 of next week. I have sent a message to their team for scheduling.    Patient understands to follow up with provider Georgie Chard NP-C on 09/07/22 at 9:05am Patient understands discharge instructions? Yes Patient understands medications and regimen? Yes   Georgie Chard NP-C Structural Heart Team  Pager: [phone]

## 2022-09-05 ENCOUNTER — Ambulatory Visit (INDEPENDENT_AMBULATORY_CARE_PROVIDER_SITE_OTHER): Payer: PPO

## 2022-09-05 DIAGNOSIS — I5022 Chronic systolic (congestive) heart failure: Secondary | ICD-10-CM | POA: Diagnosis not present

## 2022-09-06 LAB — CUP PACEART REMOTE DEVICE CHECK
Battery Remaining Longevity: 94 mo
Battery Voltage: 3.01 V
Brady Statistic AP VP Percent: 0 %
Brady Statistic AP VS Percent: 0 %
Brady Statistic AS VP Percent: 0 %
Brady Statistic AS VS Percent: 0 %
Brady Statistic RA Percent Paced: 0 %
Brady Statistic RV Percent Paced: 92.01 %
Date Time Interrogation Session: 20240708043722
HighPow Impedance: 73 Ohm
Implantable Lead Connection Status: 753985
Implantable Lead Connection Status: 753985
Implantable Lead Connection Status: 753985
Implantable Lead Implant Date: 20050218
Implantable Lead Implant Date: 20050218
Implantable Lead Implant Date: 20050218
Implantable Lead Location: 753858
Implantable Lead Location: 753859
Implantable Lead Location: 753860
Implantable Lead Model: 4194
Implantable Lead Model: 5076
Implantable Lead Model: 6947
Implantable Pulse Generator Implant Date: 20230313
Lead Channel Impedance Value: 342 Ohm
Lead Channel Impedance Value: 342 Ohm
Lead Channel Impedance Value: 399 Ohm
Lead Channel Impedance Value: 589 Ohm
Lead Channel Impedance Value: 665 Ohm
Lead Channel Impedance Value: 893 Ohm
Lead Channel Pacing Threshold Amplitude: 1.125 V
Lead Channel Pacing Threshold Amplitude: 1.875 V
Lead Channel Pacing Threshold Pulse Width: 0.4 ms
Lead Channel Pacing Threshold Pulse Width: 0.4 ms
Lead Channel Sensing Intrinsic Amplitude: 1 mV
Lead Channel Sensing Intrinsic Amplitude: 5.375 mV
Lead Channel Sensing Intrinsic Amplitude: 5.375 mV
Lead Channel Setting Pacing Amplitude: 2.25 V
Lead Channel Setting Pacing Amplitude: 2.25 V
Lead Channel Setting Pacing Pulse Width: 0.4 ms
Lead Channel Setting Pacing Pulse Width: 0.4 ms
Lead Channel Setting Sensing Sensitivity: 0.3 mV
Zone Setting Status: 755011

## 2022-09-06 LAB — POCT ACTIVATED CLOTTING TIME: Activated Clotting Time: 269 seconds

## 2022-09-06 NOTE — Progress Notes (Unsigned)
[facility]   MULTIDISCIPLINARY HEART VALVE CLINIC                                     Cardiology Office Note:    Date:  09/06/2022   ID:  Emily [name], DOB 1950-03-19, MRN [id number]  PCP:  Emily Chard, DO  [facility] Cardiologist:  Marca Ancona, MD  [facility] Electrophysiologist:  None   Referring MD: Emily Chard, DO   No chief complaint on file. ***  History of Present Illness:    Emily [name] is a 72 y.o. 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    Emily [name] has been followed with Dr. Shirlee Latch for her complex cardiology care. She was initially diagnosed with myocarditis following an admission at [facility] with subsequent cardiac biopsy. Patient reported that her EF was down after that point. She had MDT CRT-device placed after that. Her EF has remained low on subsequent echo imaging. Echo in 7/21 showed EF 20-25%, moderate MR, mild AS. She is now in permanent atrial fibrillation and is s/p AV nodal ablation with BiV pacing. Elevated creatinine has limited medication management. TEE from 06/23/22 showed persistnetly decreased LV function at 30-35% with septal akinesis and  global hypokinesis of the other walls, small secundum ASD, mild to moderate AS, and moderate to severe MR. She was then referred to Dr. Lynnette Caffey for consideration of mTEER for her MR.    Today she is here alone and reports that she has been very stable since she was last seen. She denies chest pain, SOB, palpitations, LE edema, orthopnea, PND, dizziness, or syncope. Denies bleeding in stool or urine.   Patient contacted regarding discharge from [facility] on 08/31/22 after aborted mTEER procedure. Reports that she has done well with no groin issues. Discussed the  need for follow up INR check with the [facility] preferably 7/8 or 7/9 of next week. I have sent a message to their team for scheduling.     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 Most recent TEE with EF 30-35%, PMVL length: 0.9cm. Mean gradient: , Peak gradient: 10.46mmHg, Jet: broad central jet and small lateral. MR PISA radius: 0.70cm. MR PISA Eff ROA: 2mm2. Plan for mTEER with Randa Evens 08/31/22. Doing well today with NYHA class I symptoms. Pre procedure instructions reviewed with understanding. CHG soap given. Obtain CMET, CBC, BNP, INR today.    NICM: w/ persistent LVEF at 30-35% s/p CRT-D followed by AHF. Appears euvolemic on exam today although BNP elevated on pre procedure labs. Plan to treat with IV Lasix in the post procedure setting. Continue GDMT per AHF team.    Permanent AF s/p AV node ablation: Maintaining NSR. Continue Coumadin through 6/27 then she was instructed to stop for mTEER. Will plan to resume post procedure.    Stage IV CKD: Cr slightly elevated above most recent baseline. Plan to re-check on 7/3 and follow trend.    DM2: Pre procedure insulin instructions reviewed with the patient with understanding. Will cover with SSI while inpatient.    Moderate  AS: Noted on TEE, echocardiogram with a mean gradient at and AVA by VTI at 1.93. Continue to follow closely.    Past Medical History:  Diagnosis Date   Acute pain of right shoulder 03/22/2019   Acute renal failure (HCC) 04/15/2012   Last Assessment & Plan:  Superimposed on CK D stage III, Baseline Cr ~2-2.5, remains above baseline, felt to be due to Cardiorenal syndrome, creatinine trending down at time of discharge Negative urine eos and no urinary retention ACE inhibitor discontinued Follows with Dr Julien Girt as outpatient, to have labs drawn 08/15/16 and results sent to [facility] office for review    AICD (automatic cardioverter/defibrillator) present    Anemia 12/29/2016   Aortic stenosis    Arthritis    hands   ASD (atrial septal defect)    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   Asthma    as a child   Atrial fibrillation with RVR (HCC)    Biventricular ICD (implantable cardioverter-defibrillator) in place    CHF (congestive heart failure) (HCC)    CKD (chronic kidney disease), stage IV (HCC)    Colitis 09/11/2012   Complete heart block (HCC)    Dyspnea    Essential hypertension    GERD (gastroesophageal reflux disease)    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 Physical exam revealed cerumen impaction of left ear canal. Relieved by flushing.    Hypomagnesemia 04/10/2016   Hyponatremia 04/10/2016   Last Assessment & Plan:  - improved after tolvaptan 6/29, chiefly driven by CHF, further improvement with ongoing IV Lasix - continue low Na and fluid restriction - compliance with sodium restriction a major barrier to keeping patient out of hospital and compensated, remains resistant to this philosophy while inpatient - follows with Dr Julien Girt as outpatient, Neph consult not indicated at this ti   Hypothyroidism    ICD (implantable cardioverter-defibrillator) battery depletion 04/21/2014   Localized macular rash 08/11/2016   Last Assessment & Plan:  Seems to trend with heart failure, though she has more findings behind bilateral knees and up into her thigh/groin area than when I last saw her Would use steroid cream for now, consider alternative diagnosis if no improvement   Long-term insulin use in type 2 diabetes (HCC) 04/10/2016   Last Assessment & Plan:  Hypoglycemia resolved, continue SSI and hypoglycemia protocol   Mitral regurgitation    Nonischemic  cardiomyopathy (HCC)    On amiodarone therapy 08/21/2017   Persistent atrial fibrillation (HCC)    AV node ablation 2019   Pneumonia    PONV (postoperative nausea and vomiting)    has awakened during surgery   Restless leg syndrome 03/22/2019   Type 2 diabetes mellitus (HCC)     Past Surgical History:  Procedure Laterality Date   ABLATION     AV NODE ABLATION N/A 08/08/2017   Procedure: AV NODE ABLATION;  Surgeon: Hillis Range, MD;  Location: [facility] INVASIVE CV LAB;  Service: Cardiovascular;  Laterality: N/A;   BREAST BIOPSY     CARDIAC DEFIBRILLATOR PLACEMENT  2005   MDT Viva XT CRT-D BiV ICD implanted by Dr Sharlot Gowda in [place]   CATARACT EXTRACTION Bilateral    CHOLECYSTECTOMY     ICD GENERATOR CHANGEOUT N/A 05/10/2021  Procedure: ICD GENERATOR CHANGEOUT;  Surgeon: Marinus Maw, MD;  Location: [facility] INVASIVE CV LAB;  Service: Cardiovascular;  Laterality: N/A;   MITRAL VALVE REPAIR N/A 08/31/2022   Procedure: MITRAL VALVE REPAIR;  Surgeon: [name], MD;  Location: [facility] INVASIVE CV LAB;  Service: Cardiovascular;  Laterality: N/A;   RIGHT HEART CATH N/A 04/28/2022   Procedure: RIGHT HEART CATH;  Surgeon: Laurey Morale, MD;  Location: [facility] INVASIVE CV LAB;  Service: Cardiovascular;  Laterality: N/A;   RIGHT HEART CATH N/A 07/15/2022   Procedure: RIGHT HEART CATH;  Surgeon: Laurey Morale, MD;  Location: [facility] INVASIVE CV LAB;  Service: Cardiovascular;  Laterality: N/A;   RIGHT HEART CATH AND CORONARY ANGIOGRAPHY N/A 12/17/2020   Procedure: RIGHT HEART CATH AND CORONARY ANGIOGRAPHY;  Surgeon: Laurey Morale, MD;  Location: [facility] INVASIVE CV LAB;  Service: Cardiovascular;  Laterality: N/A;   TEE WITHOUT CARDIOVERSION N/A 12/17/2020   Procedure: TRANSESOPHAGEAL ECHOCARDIOGRAM (TEE);  Surgeon: Laurey Morale, MD;  Location: [facility] ENDOSCOPY;  Service: Cardiovascular;  Laterality: N/A;   TEE WITHOUT CARDIOVERSION N/A 06/23/2022   Procedure: TRANSESOPHAGEAL ECHOCARDIOGRAM;  Surgeon:  Laurey Morale, MD;  Location: [facility] INVASIVE CV LAB;  Service: Cardiovascular;  Laterality: N/A;   TEE WITHOUT CARDIOVERSION N/A 08/31/2022   Procedure: TRANSESOPHAGEAL ECHOCARDIOGRAM;  Surgeon: [name], MD;  Location: [facility] INVASIVE CV LAB;  Service: Cardiovascular;  Laterality: N/A;   THYROIDECTOMY     TOTAL VAGINAL HYSTERECTOMY      Current Medications: No outpatient medications have been marked as taking for the 09/07/22 encounter (Appointment) with [facility].     Allergies:   Amiodarone, Other, Codeine, Turmeric, Colchicine, Iodine, Ivp dye [iodinated contrast media], and Mexitil [mexiletine]   Social History   Socioeconomic History   Marital status: Married    Spouse name: Not on file   Number of children: Not on file   Years of education: Not on file   Highest education level: Not on file  Occupational History   Not on file  Tobacco Use   Smoking status: Never    Passive exposure: Never   Smokeless tobacco: Never  Vaping Use   Vaping Use: Never used  Substance and Sexual Activity   Alcohol use: No   Drug use: No   Sexual activity: Not on file  Other Topics Concern   Not on file  Social History Narrative   Not on file   Social Determinants of Health   Financial Resource Strain: Not on file  Food Insecurity: Not on file  Transportation Needs: Not on file  Physical Activity: Not on file  Stress: Not on file  Social Connections: Not on file     Family History: The patient's ***family history includes CAD in her sister; COPD in her father; Heart attack in her sister; Hip fracture in her mother.  ROS:   Please see the history of present illness.    All other systems reviewed and are negative.  EKGs/Labs/Other Studies Reviewed:    The following studies were reviewed today:  Cardiac Studies & Procedures   CARDIAC CATHETERIZATION  CARDIAC CATHETERIZATION 07/15/2022  Narrative 1. Normal filling pressures. 2. Prominent v-waves in  PCWP tracing suggestive of significant mitral regurgitation. 3. Good cardiac output.  Compensated CHF with evidence for significant mitral regurgitation.   CARDIAC CATHETERIZATION  CARDIAC CATHETERIZATION 04/28/2022  Narrative 1. Qp/Qs 1.29.  This suggests small ASD. 2. Preserved cardiac output. 3. Elevated PCWP with prominent V-waves.  Echo to  reassess for mitral regurgitation. 4. Mildly elevated RA pressure. 5. Moderate pulmonary venous hypertension.     ECHOCARDIOGRAM  ECHOCARDIOGRAM LIMITED 08/02/2022  Narrative ECHOCARDIOGRAM LIMITED REPORT    Patient Name:   KARLEY PHO Date of Exam: 08/02/2022 Medical Rec #:  [id number]       Height:       63.0 in Accession #:    [id number]      Weight:       124.0 lb Date of Birth:  April 30, 1950       BSA:          1.578 m Patient Age:    71 years        BP:           110/78 mmHg Patient Gender: F               HR:           91 bpm. Exam Location:  [address]  Procedure: 2D Echo, Limited Echo, Limited Color Doppler and Cardiac Doppler  Indications:    I05.9 Mitral Valve Disorder LIMITED ECHO TO ASSESS MITRAL REGURGITATION  History:        Patient has prior history of Echocardiogram examinations, most recent 06/23/2022. CHF, Defibrillator; Risk Factors:Hypertension, Diabetes and Dyslipidemia. Nonischemic cardiomyopathy.  Sonographer:    Daphine Deutscher RDCS Referring Phys: [id number] [name]  IMPRESSIONS   1. Akinesis of the anteroseptal, anterior, anteroseptal wall from the base to apex. Left ventricular ejection fraction, by estimation, is 30 to 35%. The left ventricle has moderately decreased function. The left ventricle demonstrates global hypokinesis. Left ventricular diastolic parameters are indeterminate. 2. Right ventricular systolic function is moderately reduced. The right ventricular size is normal. 3. Functional MR. The mitral valve is grossly normal. Moderate mitral valve regurgitation. 4. The  inferior vena cava is dilated in size with >50% respiratory variability, suggesting right atrial pressure of 8 mmHg.  FINDINGS Left Ventricle: Akinesis of the anteroseptal, anterior, anteroseptal wall from the base to apex. Left ventricular ejection fraction, by estimation, is 30 to 35%. The left ventricle has moderately decreased function. The left ventricle demonstrates global hypokinesis. There is no left ventricular hypertrophy. Left ventricular diastolic parameters are indeterminate.  Right Ventricle: The right ventricular size is normal. Right ventricular systolic function is moderately reduced.  Pericardium: There is no evidence of pericardial effusion.  Mitral Valve: Functional MR. The mitral valve is grossly normal. Moderate mitral valve regurgitation.  Aortic Valve: Aortic valve mean gradient measures 7.5 mmHg. Aortic valve peak gradient measures 13.1 mmHg.  Venous: The inferior vena cava is dilated in size with greater than 50% respiratory variability, suggesting right atrial pressure of 8 mmHg.  Additional Comments: A device lead is visualized.  LEFT VENTRICLE PLAX 2D LVIDd:         5.80 cm LVIDs:         4.80 cm LV PW:         0.80 cm LV IVS:        0.40 cm   IVC IVC diam: 2.10 cm  LEFT ATRIUM         Index LA diam:    4.30 cm 2.72 cm/m AORTIC VALVE AV Vmax:           181.00 cm/s AV Vmean:          131.000 cm/s AV VTI:            0.382 m AV Peak Grad:      13.1  mmHg AV Mean Grad:      7.5 mmHg LVOT Vmax:         87.43 cm/s LVOT Vmean:        61.300 cm/s LVOT VTI:          0.186 m LVOT/AV VTI ratio: 0.49  AORTA Ao Root diam: 2.60 cm  MR Peak grad: 97.8 mmHg     TRICUSPID VALVE MR Mean grad: 64.8 mmHg     TR Peak grad:   30.5 mmHg MR Vmax:      494.50 cm/s   TR Vmax:        276.00 cm/s MR Vmean:     382.5 cm/s MV E velocity: 152.33 cm/s  SHUNTS Systemic VTI: 0.19 m  Carolan Clines Electronically signed by Carolan Clines Signature Date/Time: 08/02/2022/6:47:59  PM    Final   TEE  ECHO TEE 06/23/2022  Narrative TRANSESOPHOGEAL ECHO REPORT    Patient Name:   SHERIL HAMMOND Date of Exam: 06/23/2022 Medical Rec #:  [id number]       Height:       62.5 in Accession #:    [id number]      Weight:       124.2 lb Date of Birth:  05/18/1950       BSA:          1.571 m Patient Age:    71 years        BP:           144/78 mmHg Patient Gender: F               HR:           81 bpm. Exam Location:  Inpatient  Procedure: 3D Echo, Transesophageal Echo, Cardiac Doppler and Color Doppler  Indications:     Atrial septal defect. ; I34.0 Nonrheumatic mitral (valve) insufficiency; I35.0 Nonrheumatic aortic (valve) stenosis  History:         Patient has prior history of Echocardiogram examinations, most recent 04/28/2022. CHF and Cardiomyopathy, Defibrillator and Abnormal ECG, Arrythmias:Atrial Fibrillation, Signs/Symptoms:Chest Pain; Risk Factors:Hypertension.  Sonographer:     Sheralyn Boatman RDCS Referring Phys:  [id number] [facility] Diagnosing Phys: Freida Busman McleanMD  PROCEDURE: After discussion of the risks and benefits of a TEE, an informed consent was obtained from the patient. The transesophogeal probe was passed without difficulty through the esophogus of the patient. Imaged were obtained with the patient in a left lateral decubitus position. Local oropharyngeal anesthetic was provided with Cetacaine. Sedation performed by different physician. The patient was monitored while under deep sedation. Anesthestetic sedation was provided intravenously by Anesthesiology: 212mg  of Propofol. The patient's vital signs; including heart rate, blood pressure, and oxygen saturation; remained stable throughout the procedure. The patient developed no complications during the procedure.  IMPRESSIONS   1. Left ventricular ejection fraction, by estimation, is 30 to 35%. The left ventricle has moderately decreased function. The left ventricle demonstrates regional wall motion  abnormalities with septal akinesis and global hypokinesis of the other walls. The left ventricular internal cavity size was mildly dilated. 2. Right ventricular systolic function is mildly reduced. The right ventricular size is normal. 3. Left atrial size was mildly dilated. No left atrial/left atrial appendage thrombus was detected. There was "smoke" noted in the left atrial appendage. 4. Right atrial size was mildly dilated. 5. Small secundum ASD, 0.45 cm in diameter, area 0.123 cm^2. 6. Peak RV-RA gradient 18 mmHg. Tricuspid valve regurgitation is mild to moderate. 7. The aortic  valve is tricuspid. There is severe calcifcation of the aortic valve. Aortic valve regurgitation is trivial. Mild to moderate aortic valve stenosis. Aortic valve mean gradient measures 11.0 mmHg with AVA 1.93 cm^2 by continuity equation. 8. The mitral valve is abnormal. Moderate to severe mitral valve regurgitation. There was a broad jet of mitral regurgitation (or 2 jets close together) and a small jet more laterally. PISA ERO from the broad/larger jet was 0.31 cm^2. Vena contracta area 0.3 cm^2. No evidence of mitral stenosis (mean gradient 3 mmHg). 9. Grade 3 plaque in the thoracic aorta.  FINDINGS Left Ventricle: Left ventricular ejection fraction, by estimation, is 30 to 35%. The left ventricle has moderately decreased function. The left ventricle demonstrates regional wall motion abnormalities. The left ventricular internal cavity size was mildly dilated.  Right Ventricle: The right ventricular size is normal. No increase in right ventricular wall thickness. Right ventricular systolic function is mildly reduced.  Left Atrium: Left atrial size was mildly dilated. No left atrial/left atrial appendage thrombus was detected.  Right Atrium: Right atrial size was mildly dilated.  Pericardium: There is no evidence of pericardial effusion.  Mitral Valve: The mitral valve is abnormal. Moderate to severe mitral valve  regurgitation. No evidence of mitral valve stenosis. MV peak gradient, 10.4 mmHg. The mean mitral valve gradient is 4.0 mmHg.  Tricuspid Valve: Peak RV-RA gradient 18 mmHg. The tricuspid valve is normal in structure. Tricuspid valve regurgitation is mild to moderate.  Aortic Valve: The aortic valve is tricuspid. There is severe calcifcation of the aortic valve. Aortic valve regurgitation is trivial. Mild to moderate aortic stenosis is present. Aortic valve mean gradient measures 11.0 mmHg. Aortic valve peak gradient measures 15.4 mmHg. Aortic valve area, by VTI measures 2.12 cm.  Pulmonic Valve: The pulmonic valve was normal in structure. Pulmonic valve regurgitation is not visualized.  Aorta: Grade 3 plaque in the thoracic aorta. The aortic root is normal in size and structure.  IAS/Shunts: Small secundum ASD, 0.45 cm in diameter, area 0.123 cm^2.  Additional Comments: A device lead is visualized in the right ventricle. Spectral Doppler performed.  LEFT VENTRICLE PLAX 2D LVOT diam:     2.20 cm LV SV:         105 LV SV Index:   67 LVOT Area:     3.80 cm   AORTIC VALVE AV Area (Vmax):    2.13 cm AV Area (Vmean):   2.29 cm AV Area (VTI):     2.12 cm AV Vmax:           196.40 cm/s AV Vmean:          160.000 cm/s AV VTI:            0.497 m AV Peak Grad:      15.4 mmHg AV Mean Grad:      11.0 mmHg LVOT Vmax:         110.27 cm/s LVOT Vmean:        96.300 cm/s LVOT VTI:          0.277 m LVOT/AV VTI ratio: 0.56  MITRAL VALVE MV Peak grad: 10.4 mmHg       SHUNTS MV Mean grad: 4.0 mmHg        Systemic VTI:  0.28 m MV Vmax:      1.62 m/s        Systemic Diam: 2.20 cm MV Vmean:     88.5 cm/s MR Peak grad:    115.3 mmHg MR Mean grad:  68.0 mmHg MR Vmax:         537.00 cm/s MR Vmean:        384.0 cm/s MR PISA:         3.08 cm MR PISA Eff ROA: 24 mm MR PISA Radius:  0.70 cm  Dalton McleanMD Electronically signed by Wilfred Lacy Signature Date/Time: 06/23/2022/2:44:07  PM    Final   MONITORS  LONG TERM MONITOR (3-14 DAYS) 05/29/2022  Narrative Patch Wear Time:  6 days and 22 hours (2024-02-26T11:54:16-0500 to 2024-03-04T10:10:01-499)  7 Ventricular Tachycardia runs occurred, the run with the fastest interval lasting 11 beats with a max rate of 167 bpm (avg 135 bpm); the run with the fastest interval was also the longest. Atrial Fibrillation occurred continuously (100% burden), ranging from 50-103 bpm (avg of 67 bpm). Possible Ventricular Pacing. Isolated VEs were occasional (3.1%, 20296), VE Couplets were rare (<1.0%, 31), and VE Triplets were rare (<1.0%, 6).  Conclusion: 1. Predominantly atrial fibrillation 2. 7 NSVT runs, longest 11 beats. 3. 3.1% PVCs             EKG:  EKG is *** ordered today.  The ekg ordered today demonstrates ***  Recent Labs: 12/28/2021: TSH 0.340 05/18/2022: B Natriuretic Peptide 354.0 08/24/2022: ALT 6; BUN 45; Creatinine, Ser 2.37; Hemoglobin 11.5; NT-Pro BNP 3,372; Platelets 335; Potassium 4.3; Sodium 133  Recent Lipid Panel    Component Value Date/Time   CHOL 157 12/28/2021 1605   TRIG 174 (H) 12/28/2021 1605   HDL 36 (L) 12/28/2021 1605   CHOLHDL 4.4 12/28/2021 1605   LDLCALC 91 12/28/2021 1605     Risk Assessment/Calculations:   {Does this patient have ATRIAL FIBRILLATION?[phone]}   Physical Exam:    VS:  There were no vitals taken for this visit.    Wt Readings from Last 3 Encounters:  08/31/22 123 lb 9.6 oz (56.1 kg)  08/24/22 123 lb 9.6 oz (56.1 kg)  07/22/22 124 lb (56.2 kg)     GEN: *** Well nourished, well developed in no acute distress HEENT: Normal NECK: No JVD; No carotid bruits LYMPHATICS: No lymphadenopathy CARDIAC: ***RRR, no murmurs, rubs, gallops RESPIRATORY:  Clear to auscultation without rales, wheezing or rhonchi  ABDOMEN: Soft, non-tender, non-distended MUSCULOSKELETAL:  No edema; No deformity  SKIN: Warm and dry NEUROLOGIC:  Alert and oriented x 3 PSYCHIATRIC:   Normal affect   ASSESSMENT:    No diagnosis found. PLAN:    In order of problems listed above:       {Are you ordering a CV Procedure (e.g. stress test, cath, DCCV, TEE, etc)?   Press F2        :[id number]}    Medication Adjustments/Labs and Tests Ordered: Current medicines are reviewed at length with the patient today.  Concerns regarding medicines are outlined above.  No orders of the defined types were placed in this encounter.  No orders of the defined types were placed in this encounter.   There are no Patient Instructions on file for this visit.   Signed, Georgie Chard, NP  09/06/2022 1:01 PM    [facility]

## 2022-09-07 ENCOUNTER — Ambulatory Visit: Payer: PPO | Attending: Cardiology | Admitting: Cardiology

## 2022-09-07 ENCOUNTER — Other Ambulatory Visit: Payer: Self-pay | Admitting: Cardiology

## 2022-09-07 ENCOUNTER — Ambulatory Visit: Payer: PPO

## 2022-09-07 VITALS — BP 108/76 | HR 88 | Ht 62.0 in | Wt 120.0 lb

## 2022-09-07 DIAGNOSIS — N184 Chronic kidney disease, stage 4 (severe): Secondary | ICD-10-CM

## 2022-09-07 DIAGNOSIS — H539 Unspecified visual disturbance: Secondary | ICD-10-CM | POA: Diagnosis not present

## 2022-09-07 DIAGNOSIS — I4891 Unspecified atrial fibrillation: Secondary | ICD-10-CM

## 2022-09-07 DIAGNOSIS — Z7901 Long term (current) use of anticoagulants: Secondary | ICD-10-CM

## 2022-09-07 DIAGNOSIS — I340 Nonrheumatic mitral (valve) insufficiency: Secondary | ICD-10-CM

## 2022-09-07 DIAGNOSIS — I5022 Chronic systolic (congestive) heart failure: Secondary | ICD-10-CM | POA: Diagnosis not present

## 2022-09-07 DIAGNOSIS — I5023 Acute on chronic systolic (congestive) heart failure: Secondary | ICD-10-CM | POA: Diagnosis not present

## 2022-09-07 DIAGNOSIS — E118 Type 2 diabetes mellitus with unspecified complications: Secondary | ICD-10-CM

## 2022-09-07 DIAGNOSIS — Z95810 Presence of automatic (implantable) cardiac defibrillator: Secondary | ICD-10-CM

## 2022-09-07 DIAGNOSIS — R42 Dizziness and giddiness: Secondary | ICD-10-CM

## 2022-09-07 DIAGNOSIS — Z794 Long term (current) use of insulin: Secondary | ICD-10-CM

## 2022-09-07 MED ORDER — SPIRONOLACTONE 25 MG PO TABS: 25.0000 mg | ORAL_TABLET | Freq: Every day | ORAL | 3 refills | Status: DC

## 2022-09-07 MED ORDER — METOPROLOL SUCCINATE ER 50 MG PO TB24: 50.0000 mg | ORAL_TABLET | Freq: Every day | ORAL | 3 refills | Status: DC

## 2022-09-07 NOTE — Patient Instructions (Signed)
Medication Instructions:  Your physician has recommended you make the following change in your medication:  DECREASE TOPROL TO 50 MG DAILY. HOLD ALDACTONE    *If you need a refill on your cardiac medications before your next appointment, please call your pharmacy*   Lab Work: TODAY: BMET, CBC, PT INR If you have labs (blood work) drawn today and your tests are completely normal, you will receive your results only by: MyChart Message (if you have MyChart) OR A paper copy in the mail If you have any lab test that is abnormal or we need to change your treatment, we will call you to review the results.   Testing/Procedures: YOUR PROVIDER RECOMMENDS THAT YOU HAVE A HEAD CT DONE.   Follow-Up: At [facility], you and your health needs are our priority.  As part of our continuing mission to provide you with exceptional heart care, we have created designated Provider Care Teams.  These Care Teams include your primary Cardiologist (physician) and Advanced Practice Providers (APPs -  Physician Assistants and Nurse Practitioners) who all work together to provide you with the care you need, when you need it.  We recommend signing up for the patient portal called "MyChart".  Sign up information is provided on this After Visit Summary.  MyChart is used to connect with patients for Virtual Visits (Telemedicine).  Patients are able to view lab/test results, encounter notes, upcoming appointments, etc.  Non-urgent messages can be sent to your provider as well.   To learn more about what you can do with MyChart, go to [email or web address].    Your next appointment:   BASED ON RESULTS

## 2022-09-08 ENCOUNTER — Telehealth: Payer: Self-pay

## 2022-09-08 ENCOUNTER — Ambulatory Visit (INDEPENDENT_AMBULATORY_CARE_PROVIDER_SITE_OTHER): Payer: PPO | Admitting: *Deleted

## 2022-09-08 DIAGNOSIS — Z5181 Encounter for therapeutic drug level monitoring: Secondary | ICD-10-CM | POA: Diagnosis not present

## 2022-09-08 DIAGNOSIS — I4891 Unspecified atrial fibrillation: Secondary | ICD-10-CM

## 2022-09-08 LAB — BASIC METABOLIC PANEL
BUN/Creatinine Ratio: 25 (ref 12–28)
BUN: 60 mg/dL — ABNORMAL HIGH (ref 8–27)
CO2: 20 mmol/L (ref 20–29)
Calcium: 8.9 mg/dL (ref 8.7–10.3)
Chloride: 88 mmol/L — ABNORMAL LOW (ref 96–106)
Creatinine, Ser: 2.42 mg/dL — ABNORMAL HIGH (ref 0.57–1.00)
Glucose: 447 mg/dL — ABNORMAL HIGH (ref 70–99)
Potassium: 4.5 mmol/L (ref 3.5–5.2)
Sodium: 132 mmol/L — ABNORMAL LOW (ref 134–144)
eGFR: 20 mL/min/{1.73_m2} — ABNORMAL LOW (ref >59)

## 2022-09-08 LAB — CBC
Hematocrit: 39.4 % (ref 34.0–46.6)
Hemoglobin: 12.3 g/dL (ref 11.1–15.9)
MCH: 28.9 pg (ref 26.6–33.0)
MCHC: 31.2 g/dL — ABNORMAL LOW (ref 31.5–35.7)
MCV: 93 fL (ref 79–97)
Platelets: 274 10*3/uL (ref 150–450)
RBC: 4.26 x10E6/uL (ref 3.77–5.28)
RDW: 14.8 % (ref 11.7–15.4)
WBC: 14.2 10*3/uL — ABNORMAL HIGH (ref 3.4–10.8)

## 2022-09-08 LAB — PROTIME-INR
INR: 1.2 (ref 0.9–1.2)
Prothrombin Time: 12.8 s — ABNORMAL HIGH (ref 9.1–12.0)

## 2022-09-08 NOTE — Telephone Encounter (Signed)
Spoke with patient regarding glucose level of 447 on her BMET on 09/07/22.  Patient states she sometimes runs high and has not checked her blood sugar today. Advised she recheck. Today it was 388. She took 8 units of Humalog per her sliding scale. Provided education on importance of checking blood sugar and reporting to her PCP if it is consistently running high.   Patient verbalized understanding and had no questions.    Reviewed with Dr Excell Seltzer (DOD) and no additional orders or recommendations received.

## 2022-09-08 NOTE — Patient Instructions (Signed)
Take warfarin 2 tablets tonight and tomorrow night then continue 1 tablet daily Recheck in 1 week Was off warfarin for hospital procedure Pt told and she verbalized understanding.

## 2022-09-09 ENCOUNTER — Other Ambulatory Visit: Payer: Self-pay | Admitting: Cardiology

## 2022-09-09 NOTE — Progress Notes (Signed)
Agree with CT head and neurology referral.  CVA certainly could be possible especially with subtherapeutic INR.

## 2022-09-13 ENCOUNTER — Telehealth: Payer: Self-pay | Admitting: Cardiology

## 2022-09-13 NOTE — Telephone Encounter (Signed)
Follow up with Dr. Shirlee Latch is scheduled on 8/13. She does have follow up with Dr. Ladona Ridgel on 8/1. Head CT scheduled 7/20.   Georgie Chard NP-C Structural Heart Team  Pager: [phone] Phone: [phone]

## 2022-09-13 NOTE — Telephone Encounter (Signed)
Spoke to patient to follow symptoms after recent office visit with changes. She reports she continues to have fatigue however as of yesterday, the fluttering/floaters in her right visual field have diminished. [facility]. I have asked that she request that they take her BP while there as she has no BP cuff at home. Plan for repeat call tomorrow to set up follow up with myself. She does not have follow up with Dr. Shirlee Latch until 8/154

## 2022-09-15 ENCOUNTER — Ambulatory Visit: Payer: PPO | Attending: Cardiology | Admitting: *Deleted

## 2022-09-15 DIAGNOSIS — I4891 Unspecified atrial fibrillation: Secondary | ICD-10-CM | POA: Diagnosis not present

## 2022-09-15 DIAGNOSIS — Z5181 Encounter for therapeutic drug level monitoring: Secondary | ICD-10-CM

## 2022-09-15 DIAGNOSIS — I513 Intracardiac thrombosis, not elsewhere classified: Secondary | ICD-10-CM

## 2022-09-15 LAB — POCT INR: INR: 1.4 — AB (ref 2.0–3.0)

## 2022-09-15 NOTE — Patient Instructions (Signed)
Increase warfarin to 1 tablet daily except 2 tablets on Mondays and Thursdays Recheck in 2 week

## 2022-09-15 NOTE — Progress Notes (Addendum)
Subjective:   Emily [name] is a 72 y.o. female who presents for an Initial Medicare Annual Wellness Visit.  Visit Complete: Virtual  I connected with  Emily [name] on 09/16/22 by a audio enabled telemedicine application and verified that I am speaking with the correct person using two identifiers.  Patient Location: Home  Provider Location: Home Office  I discussed the limitations of evaluation and management by telemedicine. The patient expressed understanding and agreed to proceed.  Review of Systems     Cardiac Risk Factors include: advanced age (>72men, >44 women);hypertension;dyslipidemia;diabetes mellitus;sedentary lifestyle  Per patient no change in vitals since last visit, unable to obtain new vitals due to telehealth visit     Objective:    Today's Vitals   09/16/22 1602  Weight: 120 lb (54.4 kg)  Height: 5\' 2"  (1.575 m)   Body mass index is 21.95 kg/m.     09/16/2022    4:05 PM 08/31/2022    6:59 AM 07/15/2022    9:47 AM 04/28/2022    5:38 AM 12/29/2021   12:14 PM 12/28/2021    2:24 PM 12/14/2021    8:12 PM  Advanced Directives  Does Patient Have a Medical Advance Directive? No No No No No No No  Would patient like information on creating a medical advance directive? Yes (MAU/Ambulatory/Procedural Areas - Information given) No - Patient declined No - Patient declined No - Patient declined No - Patient declined No - Patient declined     Current Medications (verified) Outpatient Encounter Medications as of 09/16/2022  Medication Sig   acetaminophen (TYLENOL) 500 MG tablet Take 500 mg by mouth at bedtime as needed for mild pain.   allopurinol (ZYLOPRIM) 100 MG tablet take 2 tablets in the morning and 1 tablet in the evening.   Calcium Carb-Cholecalciferol (CALCIUM + VITAMIN D3 PO) Take 1 tablet by mouth daily.   empagliflozin (JARDIANCE) 10 MG TABS tablet Take 1 tablet (10 mg total) by mouth daily before breakfast.   Glucosamine-Chondroitin (OSTEO BI-FLEX  REGULAR STRENGTH PO) Take 1 tablet by mouth in the morning.   insulin glargine, 2 Unit Dial, (TOUJEO MAX SOLOSTAR) 300 UNIT/ML Solostar Pen Inject 24 Units into the skin at bedtime.   insulin lispro (HUMALOG) 100 UNIT/ML injection Inject 4-8 Units into the skin daily as needed for high blood sugar. Sliding scale Less than 150 =0 150-200=4 Units 201-250=5 Units 251-300=6 Units 301-350=7 Units 351-400=8 Units   INSULIN SYRINGE .5CC/29G 29G X 1/2" 0.5 ML MISC Use to give yourself daily basal insulin (Lantus or Tuojeo).   levothyroxine (SYNTHROID) 88 MCG tablet Take 1 tablet (88 mcg total) by mouth daily before breakfast.   loratadine (CLARITIN) 10 MG tablet Take 10 mg by mouth in the morning.   metoprolol succinate (TOPROL-XL) 50 MG 24 hr tablet Take 1 tablet (50 mg total) by mouth daily. Take with or immediately following a meal.   nitroGLYCERIN (NITROSTAT) 0.4 MG SL tablet Place 1 tablet (0.4 mg total) under the tongue every 5 (five) minutes as needed for chest pain.   Omega-3 Fatty Acids (FISH OIL) 1200 MG CPDR Take 1,200 mg by mouth in the morning.   omeprazole (PRILOSEC) 40 MG capsule TAKE 1 CAPSULE BY MOUTH ONCE DAILY.   sacubitril-valsartan (ENTRESTO) 24-26 MG Take 1 tablet by mouth 2 (two) times daily.   simvastatin (ZOCOR) 20 MG tablet take 1 tablet every day at 6 in the evening   spironolactone (ALDACTONE) 25 MG tablet Take 1 tablet (25 mg  total) by mouth daily. ON HOLD   warfarin (COUMADIN) 2 MG tablet TAKE 1/2 TO 1 TABLET ONCE A DAY AS DIRECTED BY ANTICOAGULATION. (Patient taking differently: Take 2 mg by mouth daily with supper.)   furosemide (LASIX) 40 MG tablet Take 2 tablets (80 mg total) by mouth every morning AND 1 tablet (40 mg total) every evening.   No facility-administered encounter medications on file as of 09/16/2022.    Allergies (verified) Amiodarone, Other, Codeine, Turmeric, Colchicine, Iodine, Ivp dye [iodinated contrast media], and Mexitil [mexiletine]    History: Past Medical History:  Diagnosis Date   Acute pain of right shoulder 03/22/2019   Acute renal failure (HCC) 04/15/2012   Last Assessment & Plan:  Superimposed on CK D stage III, Baseline Cr ~2-2.5, remains above baseline, felt to be due to Cardiorenal syndrome, creatinine trending down at time of discharge Negative urine eos and no urinary retention ACE inhibitor discontinued Follows with Dr Julien Girt as outpatient, to have labs drawn 08/15/16 and results sent to [facility] office for review   AICD (automatic cardioverter/defibrillator) present    Anemia 12/29/2016   Aortic stenosis    Arthritis    hands   ASD (atrial septal defect)    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   Asthma    as a child   Atrial fibrillation with RVR (HCC)    Biventricular ICD (implantable cardioverter-defibrillator) in place    CHF (congestive heart failure) (HCC)    CKD (chronic kidney disease), stage IV (HCC)    Colitis 09/11/2012   Complete heart block (HCC)    Dyspnea    Essential hypertension    GERD (gastroesophageal reflux disease)    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 Physical exam revealed cerumen impaction of left ear canal. Relieved by flushing.    Hypomagnesemia 04/10/2016   Hyponatremia 04/10/2016   Last Assessment & Plan:  - improved after tolvaptan 6/29, chiefly driven by CHF, further improvement with ongoing IV Lasix - continue low Na and fluid restriction - compliance with sodium restriction a major barrier to keeping patient out of hospital and compensated, remains resistant to this philosophy while inpatient - follows with Dr Julien Girt as outpatient, Neph consult not indicated at this ti   Hypothyroidism    ICD (implantable cardioverter-defibrillator)  battery depletion 04/21/2014   Localized macular rash 08/11/2016   Last Assessment & Plan:  Seems to trend with heart failure, though she has more findings behind bilateral knees and up into her thigh/groin area than when I last saw her Would use steroid cream for now, consider alternative diagnosis if no improvement   Long-term insulin use in type 2 diabetes (HCC) 04/10/2016   Last Assessment & Plan:  Hypoglycemia resolved, continue SSI and hypoglycemia protocol   Mitral regurgitation    Nonischemic cardiomyopathy (HCC)    On amiodarone therapy 08/21/2017   Persistent atrial fibrillation (HCC)    AV node ablation 2019   Pneumonia    PONV (postoperative nausea and vomiting)    has awakened during surgery   Restless leg syndrome 03/22/2019   Type 2 diabetes mellitus (HCC)    Past Surgical History:  Procedure Laterality Date   ABLATION     AV NODE ABLATION N/A 08/08/2017  Procedure: AV NODE ABLATION;  Surgeon: Hillis Range, MD;  Location: [facility] INVASIVE CV LAB;  Service: Cardiovascular;  Laterality: N/A;   BREAST BIOPSY     CARDIAC DEFIBRILLATOR PLACEMENT  2005   MDT Viva XT CRT-D BiV ICD implanted by Dr Sharlot Gowda in [place]   CATARACT EXTRACTION Bilateral    CHOLECYSTECTOMY     ICD GENERATOR CHANGEOUT N/A 05/10/2021   Procedure: ICD GENERATOR CHANGEOUT;  Surgeon: Marinus Maw, MD;  Location: [facility] INVASIVE CV LAB;  Service: Cardiovascular;  Laterality: N/A;   MITRAL VALVE REPAIR N/A 08/31/2022   Procedure: MITRAL VALVE REPAIR;  Surgeon: [name], MD;  Location: [facility] INVASIVE CV LAB;  Service: Cardiovascular;  Laterality: N/A;   RIGHT HEART CATH N/A 04/28/2022   Procedure: RIGHT HEART CATH;  Surgeon: Laurey Morale, MD;  Location: [facility] INVASIVE CV LAB;  Service: Cardiovascular;  Laterality: N/A;   RIGHT HEART CATH N/A 07/15/2022   Procedure: RIGHT HEART CATH;  Surgeon: Laurey Morale, MD;  Location: [facility] INVASIVE CV LAB;  Service: Cardiovascular;  Laterality: N/A;   RIGHT HEART  CATH AND CORONARY ANGIOGRAPHY N/A 12/17/2020   Procedure: RIGHT HEART CATH AND CORONARY ANGIOGRAPHY;  Surgeon: Laurey Morale, MD;  Location: [facility] INVASIVE CV LAB;  Service: Cardiovascular;  Laterality: N/A;   TEE WITHOUT CARDIOVERSION N/A 12/17/2020   Procedure: TRANSESOPHAGEAL ECHOCARDIOGRAM (TEE);  Surgeon: Laurey Morale, MD;  Location: [facility] ENDOSCOPY;  Service: Cardiovascular;  Laterality: N/A;   TEE WITHOUT CARDIOVERSION N/A 06/23/2022   Procedure: TRANSESOPHAGEAL ECHOCARDIOGRAM;  Surgeon: Laurey Morale, MD;  Location: [facility] INVASIVE CV LAB;  Service: Cardiovascular;  Laterality: N/A;   TEE WITHOUT CARDIOVERSION N/A 08/31/2022   Procedure: TRANSESOPHAGEAL ECHOCARDIOGRAM;  Surgeon: [name], MD;  Location: [facility] INVASIVE CV LAB;  Service: Cardiovascular;  Laterality: N/A;   THYROIDECTOMY     TOTAL VAGINAL HYSTERECTOMY     Family History  Problem Relation Age of Onset   Hip fracture Mother    COPD Father    CAD Sister    Heart attack Sister    Social History   Socioeconomic History   Marital status: Married    Spouse name: Not on file   Number of children: Not on file   Years of education: Not on file   Highest education level: Not on file  Occupational History   Not on file  Tobacco Use   Smoking status: Never    Passive exposure: Never   Smokeless tobacco: Never  Vaping Use   Vaping status: Never Used  Substance and Sexual Activity   Alcohol use: No   Drug use: No   Sexual activity: Not on file  Other Topics Concern   Not on file  Social History Narrative   Not on file   Social Determinants of Health   Financial Resource Strain: Low Risk  (09/16/2022)   Overall Financial Resource Strain (CARDIA)    Difficulty of Paying Living Expenses: Not hard at all  Food Insecurity: No Food Insecurity (09/16/2022)   Hunger Vital Sign    Worried About Running Out of Food in the Last Year: Never true    Ran Out of Food in the Last Year: Never true  Transportation Needs: No  Transportation Needs (09/16/2022)   PRAPARE - Administrator, Civil Service (Medical): No    Lack of Transportation (Non-Medical): No  Physical Activity: Insufficiently Active (09/16/2022)   Exercise Vital Sign    Days of Exercise per Week: 3  days    Minutes of Exercise per Session: 10 min  Stress: No Stress Concern Present (09/16/2022)   [employer] of Occupational Health - Occupational Stress Questionnaire    Feeling of Stress : Only a little  Social Connections: Moderately Integrated (09/16/2022)   Social Connection and Isolation Panel [NHANES]    Frequency of Communication with Friends and Family: More than three times a week    Frequency of Social Gatherings with Friends and Family: Three times a week    Attends Religious Services: 1 to 4 times per year    Active Member of Clubs or Organizations: No    Attends Banker Meetings: Never    Marital Status: Married    Tobacco Counseling Counseling given: Not Answered   Clinical Intake:  Pre-visit preparation completed: Yes  Pain : No/denies pain     Diabetes: Yes CBG done?: No Did pt. bring in CBG monitor from home?: No  How often do you need to have someone help you when you read instructions, pamphlets, or other written materials from your doctor or pharmacy?: 1 - Never  Interpreter Needed?: No  Information entered by :: Kandis Fantasia LPN   Activities of Daily Living    09/16/2022    4:05 PM 08/31/2022    7:04 AM  In your present state of health, do you have any difficulty performing the following activities:  Hearing? 0   Vision? 0   Difficulty concentrating or making decisions? 0   Walking or climbing stairs? 1   Dressing or bathing? 0   Doing errands, shopping? 0 0  Preparing Food and eating ? N   Using the Toilet? N   In the past six months, have you accidently leaked urine? N   Do you have problems with loss of bowel control? N   Managing your Medications? N   Managing your  Finances? N   Housekeeping or managing your Housekeeping? Y     Patient Care Team: Gl[place]ale Chard, DO as PCP - General (Family Medicine) Laurey Morale, MD as PCP - Cardiology (Cardiology)  Indicate any recent Medical Services you may have received from other than [facility] providers in the past year (date may be approximate).     Assessment:   This is a routine wellness examination for [place].  Hearing/Vision screen Hearing Screening - Comments:: Denies hearing difficulties   Vision Screening - Comments:: Wears rx glasses - up to date with routine eye exams with in home provider sent by insurance    Dietary issues and exercise activities discussed:     Goals Addressed             This Visit's Progress    Remain active and independent         Depression Screen    09/16/2022    4:03 PM 12/28/2021    2:23 PM 07/01/2021   10:09 AM 12/23/2020    8:58 AM 11/06/2020    2:01 PM 12/31/2019   10:59 AM 03/21/2019    1:51 PM  PHQ 2/9 Scores  PHQ - 2 Score 0 2 3 0 0 0 0  PHQ- 9 Score  7 8  3 3      Fall Risk    09/16/2022    4:04 PM 12/28/2021    2:23 PM 12/23/2020    8:58 AM 11/06/2020    2:00 PM 12/31/2019   10:59 AM  Fall Risk   Falls in the past year? 1 1 0 0 0  Number falls in past yr: 0 0  0 0  Injury with Fall? 0 1  0 0  Risk for fall due to : History of fall(s);Impaired balance/gait;Impaired mobility      Follow up Education provided;Falls prevention discussed;Falls evaluation completed        MEDICARE RISK AT HOME:  Medicare Risk at Home - 09/16/22 1605     Any stairs in or around the home? No    If so, are there any without handrails? No    Home free of loose throw rugs in walkways, pet beds, electrical cords, etc? Yes    Adequate lighting in your home to reduce risk of falls? Yes    Life alert? No    Use of a cane, walker or w/c? No    Grab bars in the bathroom? Yes    Shower chair or bench in shower? No    Elevated toilet seat or a handicapped toilet? Yes              TIMED UP AND GO:  Was the test performed? No    Cognitive Function:        09/16/2022    4:05 PM  6CIT Screen  What Year? 0 points  What month? 0 points  What time? 0 points  Count back from 20 0 points  Months in reverse 0 points  Repeat phrase 0 points  Total Score 0 points    Immunizations Immunization History  Administered Date(s) Administered   Fluad Quad(high Dose 65+) 11/21/2019, 11/06/2020, 01/27/2022   Influenza,inj,Quad PF,6+ Mos 12/01/2017, 12/27/2018   PFIZER(Purple Top)SARS-COV-2 Vaccination 10/01/2019, 10/22/2019    TDAP status: Due, Education has been provided regarding the importance of this vaccine. Advised may receive this vaccine at local pharmacy or Health Dept. Aware to provide a copy of the vaccination record if obtained from local pharmacy or Health Dept. Verbalized acceptance and understanding.  Pneumococcal vaccine status: Declined,  Education has been provided regarding the importance of this vaccine but patient still declined. Advised may receive this vaccine at local pharmacy or Health Dept. Aware to provide a copy of the vaccination record if obtained from local pharmacy or Health Dept. Verbalized acceptance and understanding.   Covid-19 vaccine status: Information provided on how to obtain vaccines.   Qualifies for Shingles Vaccine? Yes   Zostavax completed No   Shingrix Completed?: No.    Education has been provided regarding the importance of this vaccine. Patient has been advised to call insurance company to determine out of pocket expense if they have not yet received this vaccine. Advised may also receive vaccine at local pharmacy or Health Dept. Verbalized acceptance and understanding.  Screening Tests Health Maintenance  Topic Date Due   OPHTHALMOLOGY EXAM  03/15/2019   COVID-19 Vaccine (3 - Pfizer risk series) 11/19/2019   HEMOGLOBIN A1C  06/28/2022   FOOT EXAM  07/02/2022   Zoster Vaccines- Shingrix (1 of 2)  12/17/2022 (Originally 09/07/1969)   Pneumonia Vaccine 30+ Years old (1 of 1 - PCV) 09/16/2023 (Originally 09/06/2013)   Hepatitis C Screening  09/16/2023 (Originally 09/07/1968)   INFLUENZA VACCINE  09/29/2022   Diabetic kidney evaluation - Urine ACR  12/29/2022   Diabetic kidney evaluation - eGFR measurement  09/07/2023   Medicare Annual Wellness (AWV)  09/16/2023   Colonoscopy  09/09/2026   DEXA SCAN  Completed   HPV VACCINES  Aged Out   DTaP/Tdap/Td  Discontinued   MAMMOGRAM  Discontinued    Health Maintenance  Health  Maintenance Due  Topic Date Due   OPHTHALMOLOGY EXAM  03/15/2019   COVID-19 Vaccine (3 - Pfizer risk series) 11/19/2019   HEMOGLOBIN A1C  06/28/2022   FOOT EXAM  07/02/2022    Colorectal cancer screening: Type of screening: Colonoscopy. Completed 09/08/16. Repeat every - years  Mammogram status:  Patient declines   Bone Density status: Completed 03/26/14. Results reflect: Bone density results: OSTEOPENIA. Repeat every - years.  Lung Cancer Screening: (Low Dose CT Chest recommended if Age 8-80 years, 20 pack-year currently smoking OR have quit w/in 15years.) does not qualify.   Lung Cancer Screening Referral: n/a  Additional Screening:  Hepatitis C Screening: does qualify  Vision Screening: Recommended annual ophthalmology exams for early detection of glaucoma and other disorders of the eye. Is the patient up to date with their annual eye exam?  Yes  Who is the provider or what is the name of the office in which the patient attends annual eye exams? Payer provider If pt is not established with a provider, would they like to be referred to a provider to establish care? No .   Dental Screening: Recommended annual dental exams for proper oral hygiene  Diabetic Foot Exam: Diabetic Foot Exam: Overdue, Pt has been advised about the importance in completing this exam. Pt is scheduled for diabetic foot exam on at next office visit.  Community Resource Referral /  Chronic Care Management: CRR required this visit?  No   CCM required this visit?  No     Plan:     I have personally reviewed and noted the following in the patient's chart:   Medical and social history Use of alcohol, tobacco or illicit drugs  Current medications and supplements including opioid prescriptions. Patient is not currently taking opioid prescriptions. Functional ability and status Nutritional status Physical activity Advanced directives List of other physicians Hospitalizations, surgeries, and ER visits in previous 12 months Vitals Screenings to include cognitive, depression, and falls Referrals and appointments  In addition, I have reviewed and discussed with patient certain preventive protocols, quality metrics, and best practice recommendations. A written personalized care plan for preventive services as well as general preventive health recommendations were provided to patient.     Kandis Fantasia [place], [place]   6/64/4034   After Visit Summary: (Mail) Due to this being a telephonic visit, the after visit summary with patients personalized plan was offered to patient via mail   Nurse Notes: No concerns at this time

## 2022-09-15 NOTE — Patient Instructions (Addendum)
Emily [name] , Thank you for taking time to come for your Medicare Wellness Visit. I appreciate your ongoing commitment to your health goals. Please review the following plan we discussed and let me know if I can assist you in the future.   These are the goals we discussed:  Goals      Remain active and independent        This is a list of the screening recommended for you and due dates:  Health Maintenance  Topic Date Due   Eye exam for diabetics  03/15/2019   COVID-19 Vaccine (3 - Pfizer risk series) 11/19/2019   Hemoglobin A1C  06/28/2022   Complete foot exam   07/02/2022   Zoster (Shingles) Vaccine (1 of 2) 12/17/2022*   Pneumonia Vaccine (1 of 1 - PCV) 09/16/2023*   Hepatitis C Screening  09/16/2023*   Flu Shot  09/29/2022   Yearly kidney health urinalysis for diabetes  12/29/2022   Yearly kidney function blood test for diabetes  09/07/2023   Medicare Annual Wellness Visit  09/16/2023   Colon Cancer Screening  09/09/2026   DEXA scan (bone density measurement)  Completed   HPV Vaccine  Aged Out   DTaP/Tdap/Td vaccine  Discontinued   Mammogram  Discontinued  *Topic was postponed. The date shown is not the original due date.    Advanced directives: Information on Advanced Care Planning can be found at [facility] Advance Health Care Directives Advance Health Care Directives ([email or web address] Please bring a copy of your health care power of attorney and living will to the office to be added to your chart at your convenience.  Conditions/risks identified: Aim for 30 minutes of exercise or brisk walking, 6-8 glasses of water, and 5 servings of fruits and vegetables each day.  Next appointment: Follow up in one year for your annual wellness visit    Preventive Care 65 Years and Older, Female Preventive care refers to lifestyle choices and visits with your health care provider that can promote health and wellness. What does preventive care include? A yearly  physical exam. This is also called an annual well check. Dental exams once or twice a year. Routine eye exams. Ask your health care provider how often you should have your eyes checked. Personal lifestyle choices, including: Daily care of your teeth and gums. Regular physical activity. Eating a healthy diet. Avoiding tobacco and drug use. Limiting alcohol use. Practicing safe sex. Taking low-dose aspirin every day. Taking vitamin and mineral supplements as recommended by your health care provider. What happens during an annual well check? The services and screenings done by your health care provider during your annual well check will depend on your age, overall health, lifestyle risk factors, and family history of disease. Counseling  Your health care provider may ask you questions about your: Alcohol use. Tobacco use. Drug use. Emotional well-being. Home and relationship well-being. Sexual activity. Eating habits. History of falls. Memory and ability to understand (cognition). Work and work Astronomer. Reproductive health. Screening  You may have the following tests or measurements: Height, weight, and BMI. Blood pressure. Lipid and cholesterol levels. These may be checked every 5 years, or more frequently if you are over 74 years old. Skin check. Lung cancer screening. You may have this screening every year starting at age 74 if you have a 30-pack-year history of smoking and currently smoke or have quit within the past 15 years. Fecal occult blood test (FOBT) of the stool. You may have  this test every year starting at age 74. Flexible sigmoidoscopy or colonoscopy. You may have a sigmoidoscopy every 5 years or a colonoscopy every 10 years starting at age 74. Hepatitis C blood test. Hepatitis B blood test. Sexually transmitted disease (STD) testing. Diabetes screening. This is done by checking your blood sugar (glucose) after you have not eaten for a while (fasting). You may  have this done every 1-3 years. Bone density scan. This is done to screen for osteoporosis. You may have this done starting at age 74. Mammogram. This may be done every 1-2 years. Talk to your health care provider about how often you should have regular mammograms. Talk with your health care provider about your test results, treatment options, and if necessary, the need for more tests. Vaccines  Your health care provider may recommend certain vaccines, such as: Influenza vaccine. This is recommended every year. Tetanus, diphtheria, and acellular pertussis (Tdap, Td) vaccine. You may need a Td booster every 10 years. Zoster vaccine. You may need this after age 74. Pneumococcal 13-valent conjugate (PCV13) vaccine. One dose is recommended after age 74. Pneumococcal polysaccharide (PPSV23) vaccine. One dose is recommended after age 74. Talk to your health care provider about which screenings and vaccines you need and how often you need them. This information is not intended to replace advice given to you by your health care provider. Make sure you discuss any questions you have with your health care provider. Document Released: 03/13/2015 Document Revised: 11/04/2015 Document Reviewed: 12/16/2014 Elsevier Interactive Patient Education  2017 [employer].  Fall Prevention in the Home Falls can cause injuries. They can happen to people of all ages. There are many things you can do to make your home safe and to help prevent falls. What can I do on the outside of my home? Regularly fix the edges of walkways and driveways and fix any cracks. Remove anything that might make you trip as you walk through a door, such as a raised step or threshold. Trim any bushes or trees on the path to your home. Use bright outdoor lighting. Clear any walking paths of anything that might make someone trip, such as rocks or tools. Regularly check to see if handrails are loose or broken. Make sure that both sides of any  steps have handrails. Any raised decks and porches should have guardrails on the edges. Have any leaves, snow, or ice cleared regularly. Use sand or salt on walking paths during winter. Clean up any spills in your garage right away. This includes oil or grease spills. What can I do in the bathroom? Use night lights. Install grab bars by the toilet and in the tub and shower. Do not use towel bars as grab bars. Use non-skid mats or decals in the tub or shower. If you need to sit down in the shower, use a plastic, non-slip stool. Keep the floor dry. Clean up any water that spills on the floor as soon as it happens. Remove soap buildup in the tub or shower regularly. Attach bath mats securely with double-sided non-slip rug tape. Do not have throw rugs and other things on the floor that can make you trip. What can I do in the bedroom? Use night lights. Make sure that you have a light by your bed that is easy to reach. Do not use any sheets or blankets that are too big for your bed. They should not hang down onto the floor. Have a firm chair that has side arms. You  can use this for support while you get dressed. Do not have throw rugs and other things on the floor that can make you trip. What can I do in the kitchen? Clean up any spills right away. Avoid walking on wet floors. Keep items that you use a lot in easy-to-reach places. If you need to reach something above you, use a strong step stool that has a grab bar. Keep electrical cords out of the way. Do not use floor polish or wax that makes floors slippery. If you must use wax, use non-skid floor wax. Do not have throw rugs and other things on the floor that can make you trip. What can I do with my stairs? Do not leave any items on the stairs. Make sure that there are handrails on both sides of the stairs and use them. Fix handrails that are broken or loose. Make sure that handrails are as long as the stairways. Check any carpeting to  make sure that it is firmly attached to the stairs. Fix any carpet that is loose or worn. Avoid having throw rugs at the top or bottom of the stairs. If you do have throw rugs, attach them to the floor with carpet tape. Make sure that you have a light switch at the top of the stairs and the bottom of the stairs. If you do not have them, ask someone to add them for you. What else can I do to help prevent falls? Wear shoes that: Do not have high heels. Have rubber bottoms. Are comfortable and fit you well. Are closed at the toe. Do not wear sandals. If you use a stepladder: Make sure that it is fully opened. Do not climb a closed stepladder. Make sure that both sides of the stepladder are locked into place. Ask someone to hold it for you, if possible. Clearly mark and make sure that you can see: Any grab bars or handrails. First and last steps. Where the edge of each step is. Use tools that help you move around (mobility aids) if they are needed. These include: Canes. Walkers. Scooters. Crutches. Turn on the lights when you go into a dark area. Replace any light bulbs as soon as they burn out. Set up your furniture so you have a clear path. Avoid moving your furniture around. If any of your floors are uneven, fix them. If there are any pets around you, be aware of where they are. Review your medicines with your doctor. Some medicines can make you feel dizzy. This can increase your chance of falling. Ask your doctor what other things that you can do to help prevent falls. This information is not intended to replace advice given to you by your health care provider. Make sure you discuss any questions you have with your health care provider. Document Released: 12/11/2008 Document Revised: 07/23/2015 Document Reviewed: 03/21/2014 Elsevier Interactive Patient Education  2017 [employer].

## 2022-09-16 ENCOUNTER — Ambulatory Visit (INDEPENDENT_AMBULATORY_CARE_PROVIDER_SITE_OTHER): Payer: PPO

## 2022-09-16 VITALS — Ht 62.0 in | Wt 120.0 lb

## 2022-09-16 DIAGNOSIS — Z0000 Encounter for general adult medical examination without abnormal findings: Secondary | ICD-10-CM | POA: Diagnosis not present

## 2022-09-17 ENCOUNTER — Ambulatory Visit (HOSPITAL_BASED_OUTPATIENT_CLINIC_OR_DEPARTMENT_OTHER): Payer: PPO

## 2022-09-19 ENCOUNTER — Ambulatory Visit: Payer: PPO | Attending: Internal Medicine

## 2022-09-19 ENCOUNTER — Other Ambulatory Visit (HOSPITAL_BASED_OUTPATIENT_CLINIC_OR_DEPARTMENT_OTHER): Payer: Self-pay

## 2022-09-19 ENCOUNTER — Other Ambulatory Visit (HOSPITAL_COMMUNITY): Payer: Self-pay | Admitting: Cardiology

## 2022-09-19 ENCOUNTER — Other Ambulatory Visit (HOSPITAL_COMMUNITY): Payer: Self-pay

## 2022-09-19 ENCOUNTER — Other Ambulatory Visit: Payer: Self-pay

## 2022-09-19 DIAGNOSIS — I5022 Chronic systolic (congestive) heart failure: Secondary | ICD-10-CM

## 2022-09-19 DIAGNOSIS — Z95810 Presence of automatic (implantable) cardiac defibrillator: Secondary | ICD-10-CM

## 2022-09-19 MED ORDER — ENTRESTO 24-26 MG PO TABS
1.0000 | ORAL_TABLET | Freq: Two times a day (BID) | ORAL | 11 refills | Status: DC
  Filled 2022-09-19: qty 60, 30d supply, fill #0
  Filled 2022-10-17: qty 60, 30d supply, fill #1
  Filled 2022-11-15: qty 60, 30d supply, fill #2
  Filled 2022-12-16: qty 60, 30d supply, fill #3
  Filled 2023-01-16: qty 60, 30d supply, fill #4

## 2022-09-20 NOTE — Progress Notes (Signed)
EPIC Encounter for ICM Monitoring  Patient Name: Emily [name] is a 72 y.o. female Date: 09/20/2022 Primary Care Physican: Glendale Chard, DO Primary Cardiologist: [facility] Electrophysiologist: Ladona Ridgel Nephrologist: [facility]-Dr Lacy Duverney  Bi-V Pacing: 89.9%  06/08/2022 Weight: 122-123 lbs 09/20/2022 Weight: 119-121 lbs    VT-NS (>4 beats, >150 bpm)     6     Spoke with patient and heart failure questions reviewed.  Transmission results reviewed.  Pt asymptomatic for fluid accumulation.  Reports feeling well at this time and voices no complaints.           Optivol Thoracic impedance suggesting possible fluid accumulation starting 7/16.    Prescribed: Furosemide 80 mg Take 1 tablet (80 mg total) by mouth every morning and 0.5 tablet (40 mg total) every evening.     Spironolactone 25 mg take 0.5 tablet by mouth daily   Labs: 09/07/2022 Creatinine 2.42, BUN 60, Potassium 4.5, Sodium 132, GFR 20 08/24/2022 Creatinine 2.37, BUN 45, Potassium 4.3, Sodium 133, GFR 21 07/15/2022 Potassium 2.7, Sodium 140  07/06/2022 Creatinine 2.08, BUN 32, Potassium 3.6, Sodium 134, GFR 25  06/23/2022 Creatinine 1.30, BUN 30, Potassium 3.1, Sodium 145  05/18/2022 Creatinine 2.01, BUN 43, Potassium 3.6, Sodium 134, GFR 26 04/19/2022 Creatinine 2.12, BUN 32, Potassium 4.0, Sodium 136, GFR 24 01/24/2022 Creatinine 2.31, BUN 31, Potassium 5.0, Sodium 136, GFR 22 A complete set of results can be found in Results Review.   Recommendations:  She will self adjust Furosemide and take extra 0.5 tablet.   Advised to limit salt intake.    Follow-up plan: ICM clinic phone appointment on 10/03/2022 to recheck.  91 day device clinic remote transmission 12/04/2022.      EP/Cardiology Office Visits:  09/29/2022 with Dr Ladona Ridgel.   10/11/2022 with Dr Shirlee Latch.       Copy of ICM check sent to Dr. Ladona Ridgel  3 month ICM trend: 09/19/2022.    12-14 Month ICM trend:     Karie Soda, RN 09/20/2022 3:03  PM

## 2022-09-21 ENCOUNTER — Other Ambulatory Visit (HOSPITAL_COMMUNITY): Payer: Self-pay

## 2022-09-22 NOTE — Progress Notes (Signed)
Remote ICD transmission.   

## 2022-09-27 ENCOUNTER — Ambulatory Visit: Payer: PPO | Attending: Internal Medicine | Admitting: *Deleted

## 2022-09-27 DIAGNOSIS — I513 Intracardiac thrombosis, not elsewhere classified: Secondary | ICD-10-CM | POA: Diagnosis not present

## 2022-09-27 DIAGNOSIS — Z5181 Encounter for therapeutic drug level monitoring: Secondary | ICD-10-CM | POA: Diagnosis not present

## 2022-09-27 DIAGNOSIS — I4891 Unspecified atrial fibrillation: Secondary | ICD-10-CM | POA: Diagnosis not present

## 2022-09-27 LAB — LAB REPORT - SCANNED
Creatinine, POC: 22 mg/dL
EGFR: 23

## 2022-09-27 LAB — POCT INR: INR: 1.6 — AB (ref 2.0–3.0)

## 2022-09-27 NOTE — Patient Instructions (Signed)
Increase warfarin to 2 tablets daily except 1 tablet on Mondays, Wednesdays and Fridays Recheck in 2 week

## 2022-09-29 ENCOUNTER — Ambulatory Visit: Payer: PPO | Admitting: Internal Medicine

## 2022-10-03 ENCOUNTER — Telehealth: Payer: Self-pay

## 2022-10-03 ENCOUNTER — Ambulatory Visit: Payer: PPO | Attending: Internal Medicine

## 2022-10-03 DIAGNOSIS — Z95810 Presence of automatic (implantable) cardiac defibrillator: Secondary | ICD-10-CM

## 2022-10-03 DIAGNOSIS — I5022 Chronic systolic (congestive) heart failure: Secondary | ICD-10-CM

## 2022-10-03 NOTE — Progress Notes (Signed)
EPIC Encounter for ICM Monitoring  Patient Name: Emily [name] is a 72 y.o. female Date: 10/03/2022 Primary Care Physican: Glendale Chard, DO Primary Cardiologist: [facility] Electrophysiologist: Ladona Ridgel Nephrologist: [facility]-Dr Lacy Duverney  Bi-V Pacing: 92.8%  06/08/2022 Weight: 122-123 lbs 09/20/2022 Weight: 119-121 lbs 10/03/2022 Weight: 120 lbs    VT-NS (>4 beats, >150 bpm)     4     Spoke with patient and heart failure questions reviewed.  Transmission results reviewed.  Pt asymptomatic for fluid accumulation.  Reports feeling well at this time and voices no complaints.  She is unsure what may be causing fluid accumulation and diet has not changed.         Optivol Thoracic impedance suggesting possible fluid accumulation starting 7/16.    Prescribed: Furosemide 80 mg Take 1 tablet (80 mg total) by mouth every morning and 0.5 tablet (40 mg total) every evening.     Spironolactone 25 mg take 0.5 tablet by mouth daily  (on hold)   Labs: 09/07/2022 Creatinine 2.42, BUN 60, Potassium 4.5, Sodium 132, GFR 20 08/24/2022 Creatinine 2.37, BUN 45, Potassium 4.3, Sodium 133, GFR 21 07/15/2022 Potassium 2.7, Sodium 140  07/06/2022 Creatinine 2.08, BUN 32, Potassium 3.6, Sodium 134, GFR 25  06/23/2022 Creatinine 1.30, BUN 30, Potassium 3.1, Sodium 145  05/18/2022 Creatinine 2.01, BUN 43, Potassium 3.6, Sodium 134, GFR 26 04/19/2022 Creatinine 2.12, BUN 32, Potassium 4.0, Sodium 136, GFR 24 01/24/2022 Creatinine 2.31, BUN 31, Potassium 5.0, Sodium 136, GFR 22 A complete set of results can be found in Results Review.   Recommendations:  Confirmed she takes prescribed Furosemide dosage of 80 mg AM/40 mg PM.  Copy sent to Dr Shirlee Latch for review and recommendations if needed.  Pt has OV appt 8/13 with Dr Shirlee Latch.   Follow-up plan: ICM clinic phone appointment on 10/17/2022 to recheck fluid levels.  91 day device clinic remote transmission 12/04/2022.      EP/Cardiology Office  Visits:  10/27/2022 with Dr Ladona Ridgel.   10/11/2022 with Dr Shirlee Latch.       Copy of ICM check sent to Dr. Ladona Ridgel  3 month ICM trend: 10/03/2022.    12-14 Month ICM trend:     Karie Soda, RN 10/03/2022 1:25 PM

## 2022-10-03 NOTE — Telephone Encounter (Signed)
ICM call to patient and requested to send a manual remote transmission to recheck fluid levels and she agreed to send manual transmission today.

## 2022-10-03 NOTE — Progress Notes (Signed)
Increase Lasix to 80 mg bid x 3 days then alternate 80 qam/40 qpm with 80 bid after that. BMET 1 week.

## 2022-10-04 MED ORDER — FUROSEMIDE 40 MG PO TABS: ORAL_TABLET | ORAL | 2 refills | Status: DC

## 2022-10-04 NOTE — Progress Notes (Signed)
Spoke with patient and advised Dr Shirlee Latch recommended to take Furosemide 80 mg twice a day x 3 days.  After 3rd day take Furosemide 80 mg every morning and 40 mg every evening ALTERNATING with 80 mg every morning and 80 mg every afternoon.  Labs will be drawn at 8/13 OV with Dr Shirlee Latch.  She verbalized understanding and agreed to recommendations.  She has Furosemide supply on hand and will call when refill is needed.

## 2022-10-09 ENCOUNTER — Other Ambulatory Visit: Payer: Self-pay | Admitting: Cardiology

## 2022-10-11 ENCOUNTER — Ambulatory Visit (HOSPITAL_COMMUNITY): Admission: RE | Admit: 2022-10-11 | Payer: PPO | Source: Ambulatory Visit | Admitting: Cardiology

## 2022-10-11 ENCOUNTER — Encounter (HOSPITAL_COMMUNITY): Payer: Self-pay | Admitting: Cardiology

## 2022-10-11 VITALS — BP 104/64 | HR 86 | Wt 124.2 lb

## 2022-10-11 DIAGNOSIS — I5022 Chronic systolic (congestive) heart failure: Secondary | ICD-10-CM | POA: Diagnosis not present

## 2022-10-11 DIAGNOSIS — Z8679 Personal history of other diseases of the circulatory system: Secondary | ICD-10-CM | POA: Diagnosis not present

## 2022-10-11 DIAGNOSIS — Z79899 Other long term (current) drug therapy: Secondary | ICD-10-CM | POA: Insufficient documentation

## 2022-10-11 DIAGNOSIS — E1122 Type 2 diabetes mellitus with diabetic chronic kidney disease: Secondary | ICD-10-CM | POA: Diagnosis not present

## 2022-10-11 DIAGNOSIS — I2510 Atherosclerotic heart disease of native coronary artery without angina pectoris: Secondary | ICD-10-CM | POA: Diagnosis not present

## 2022-10-11 DIAGNOSIS — I428 Other cardiomyopathies: Secondary | ICD-10-CM | POA: Insufficient documentation

## 2022-10-11 DIAGNOSIS — N184 Chronic kidney disease, stage 4 (severe): Secondary | ICD-10-CM | POA: Insufficient documentation

## 2022-10-11 DIAGNOSIS — R0602 Shortness of breath: Secondary | ICD-10-CM | POA: Diagnosis present

## 2022-10-11 DIAGNOSIS — Z7901 Long term (current) use of anticoagulants: Secondary | ICD-10-CM | POA: Insufficient documentation

## 2022-10-11 DIAGNOSIS — I272 Pulmonary hypertension, unspecified: Secondary | ICD-10-CM | POA: Insufficient documentation

## 2022-10-11 DIAGNOSIS — Q211 Atrial septal defect, unspecified: Secondary | ICD-10-CM | POA: Diagnosis not present

## 2022-10-11 DIAGNOSIS — Z794 Long term (current) use of insulin: Secondary | ICD-10-CM | POA: Diagnosis not present

## 2022-10-11 DIAGNOSIS — I4821 Permanent atrial fibrillation: Secondary | ICD-10-CM | POA: Diagnosis not present

## 2022-10-11 DIAGNOSIS — M109 Gout, unspecified: Secondary | ICD-10-CM | POA: Diagnosis not present

## 2022-10-11 DIAGNOSIS — I080 Rheumatic disorders of both mitral and aortic valves: Secondary | ICD-10-CM | POA: Insufficient documentation

## 2022-10-11 DIAGNOSIS — Z7984 Long term (current) use of oral hypoglycemic drugs: Secondary | ICD-10-CM | POA: Diagnosis not present

## 2022-10-11 LAB — BASIC METABOLIC PANEL
Anion gap: 14 (ref 5–15)
BUN: 35 mg/dL — ABNORMAL HIGH (ref 8–23)
CO2: 25 mmol/L (ref 22–32)
Calcium: 8.5 mg/dL — ABNORMAL LOW (ref 8.9–10.3)
Chloride: 99 mmol/L (ref 98–111)
Creatinine, Ser: 2.36 mg/dL — ABNORMAL HIGH (ref 0.44–1.00)
GFR, Estimated: 21 mL/min — ABNORMAL LOW (ref >60)
Glucose, Bld: 150 mg/dL — ABNORMAL HIGH (ref 70–99)
Potassium: 4.1 mmol/L (ref 3.5–5.1)
Sodium: 138 mmol/L (ref 135–145)

## 2022-10-11 LAB — BRAIN NATRIURETIC PEPTIDE: B Natriuretic Peptide: 504.3 pg/mL — ABNORMAL HIGH (ref 0.0–100.0)

## 2022-10-11 MED ORDER — FUROSEMIDE 40 MG PO TABS: 80.0000 mg | ORAL_TABLET | Freq: Two times a day (BID) | ORAL | Status: DC

## 2022-10-11 MED ORDER — EPLERENONE 25 MG PO TABS: 25.0000 mg | ORAL_TABLET | Freq: Every day | ORAL | 3 refills | Status: DC

## 2022-10-11 NOTE — Patient Instructions (Signed)
STOP Spironolactone  START Eplerenone 25 mg daily.  Labs done today, your results will be available in MyChart, we will contact you for abnormal readings.  Repeat blood work in 10 days.  Your physician recommends that you schedule a follow-up appointment in: 3 weeks  If you have any questions or concerns before your next appointment please send [place] a message through [place] or call our office at [phone].    TO LEAVE A MESSAGE FOR THE NURSE SELECT OPTION 2, PLEASE LEAVE A MESSAGE INCLUDING: YOUR NAME DATE OF BIRTH CALL BACK NUMBER REASON FOR CALL**this is important as we prioritize the call backs  YOU WILL RECEIVE A CALL BACK THE SAME DAY AS LONG AS YOU CALL BEFORE 4:00 PM  At the [facility], you and your health needs are our priority. As part of our continuing mission to provide you with exceptional heart care, we have created designated Provider Care Teams. These Care Teams include your primary Cardiologist (physician) and Advanced Practice Providers (APPs- Physician Assistants and Nurse Practitioners) who all work together to provide you with the care you need, when you need it.   You may see any of the following providers on your designated Care Team at your next follow up: Dr Arvilla Meres Dr Marca Ancona Dr. Marcos Eke, NP Robbie Lis, [place] [facility] [place], [place] Brynda Peon, NP Karle Plumber, PharmD   Please be sure to bring in all your medications bottles to every appointment.    Thank you for choosing [facility]

## 2022-10-11 NOTE — Progress Notes (Incomplete)
PCP: Glendale Chard, DO Cardiology: Dr. Diona Browner HF Cardiology: Dr. Shirlee Latch EP: Dr. Ladona Ridgel   72 y.o. with history of CKD stage IV, permanent atrial fibrillation, and a long-standing nonischemic cardiomyopathy was referred by Dr. Diona Browner for CHF clinic evaluation.  She first developed cardiac problems in 1988.  At that time, she was admitted with CHF and eventually underwent myocardial biopsy at [facility], per her report showing myocarditis.  Patient states that her EF has been low since then.  She had MDT CRT-D device implanted.  Echo in 7/21 showed EF 20-25%, moderate MR, mild AS. She is now in permanent atrial fibrillation and is s/p AV nodal ablation with BiV pacing. Elevated creatinine has limited medication management.    Echo in 10/22 showed EF 25-30%, severe LV dilation, RV normal, moderate TR, mod-severe AS with mean gradient 9 mmHg but AVA 0.76 cm^2 (?low gradient severe AS), mild-moderate MR, left=>right shunt possible ASD.  TEE done in 10/22 to more closely assess AS.  This showed EF 30%, moderate LV dilation, normal RV, possible calcified vegetation on the ICD lead, small secundum ASD, peak RV-RA gradient 23 mmHg, low flow/low gradient moderate AS, mild-moderate MR. RHC/LHC was done, showing mild nonobstructive CAD, left to right atrial level shunt with Qp/Qs 1.43.  I reviewed TEE and cath report with Dr. Excell Seltzer.  We decided that AS was not severe enough yet for TAVR and that the ASD was not significant enough to close percutaneously. With possible calcified vegetation on ICD, we drew blood cultures which were negative.  She saw ID and had a PET-CT, which did not suggest active endocarditis.   Patient was found on Zio patch in 10/22 to have 12.6% PVCs, this was thought to be limiting her BiV pacing.  She has a history of intolerance of amiodarone so was started on mexiletine.  She says that this caused a rash so she stopped it and rash resolved. She also decreased Toprol XL to 100 mg daily due to  nightmares when taking 150 mg daily.  She subsequently has decreased Toprol XL to 50 mg daily.   Echo in 10/23 showed EF 25-30%, severe LV dilation, normal RV, mild MR, moderate AS with AVA 1.1 cm^2, mean gradient 9 mmHg (low flow low gradient moderate AS).   Follow up 2/24, volume stable but worse NYHA III symptoms with recent syncope concerning for orthostasis.  Zio 7-day (2/24): 7 runs of NSVT, continuous atrial fibrillation  RHC (2/24) showed elevated PCWP with prominent V-waves, mildly elevated RA pressure, preserved CO, moderate pulmonary venous hypertension, small ASD (Qp/Qs 1.29). Echo showed EF 30-35%, likely low flow/low gradient mild-moderate aortic stenosis.   Follow up with Dr. Ladona Ridgel 05/10/22, with increased PVC burden, her pacing rate was increased from 60-80 and PVCs improved.   TEE 4/24 with LVEF 30-35%, LV w/ RWA, RV mildly reduced, "smoke" noted in LAA, RA mildly dilated, small secundum ASD 0.45 cm in diameter, TR mild-mod. Mild-mod aortic valve stenosis; AV mean gradient with AVA 1.93 cm^2; moderate-severe functional MR.   Echo in 6/24 with EF 30-35%, moderately decreased RV systolic function, moderate functional MR.   PASCAL mTEER was attempted in 7/24.  Unfortunately, the procedure was unsuccessful.    Today she returns for followup of CHF.  Weight up 2 lbs. She is short of breath with longer walks and going up stairs.  She is able to do housework without much problem.  Mild orthopnea.  No lightheadedness, no chest pain. She had nightmares while taking spironolactone and  is now off it with resolution of nightmares.   ECG (personally reviewed): Atrial fibrillation, BiV pacing  Medtronic device interrogation (personally reviewed): 91.4% BiV pacing, fluid index > threshold with decreased thoracic impedance.   Labs (1/21): LDL 118 Labs (9/21): K 4.4, creatinine 1.93 Labs (12/21): K 4, creatinine 1.77 Labs (2/22): TSH normal Labs (5/22): K 5, creatinine 2.42 Labs  (9/22): K 4.3, creatinine 1.73, LDL 90 Labs (10/22): K 4.2, creatinine 2.18 Labs (2/23): K 3.8, creatinine 2.42 Labs (11/23): K 3.6, creatinine 2.22 Labs (2/24): K 3.9, creatinine 2.43 Labs (3/24): K 3.6, SCr 2.01 Labs (4/24): K 3.1, SCr 1.3 Labs (7/24): K 4.8, creatinine 2.18  PMH: 1. Gout 2. Complete heart block: Medtronic CRT device.  3. CKD stage IV: Diabetic nephropathy.  4. Type 2 diabetes 5. Atrial fibrillation: Now permanent.   - AF ablation 4/18.  - AV nodal ablation in 2019 with MDT CRT-D.  6. Remote LV thrombus 7. Chronic systolic CHF: Nonischemic cardiomyopathy. Medtronic CRT-D device.  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  - LHC 2004: Nonobstructive mild CAD.  - Echo (8/19): EF 10-15%, mildly dilated LV, mild-moderately dilated RV with mild-moderately decreased systolic function.  - Echo (7/21): EF 20-25%, low normal RV function, moderate MR, mild AS.  - Echo (10/22): EF 25-30%, severe LV dilation, RV normal, moderate TR, mod-severe AS with mean gradient 9 mmHg but AVA 0.76 cm^2 (?low gradient severe AS), mild-moderate MR, left=>right shunt possible ASD.  - Echo (10/23): EF 25-30%, severe LV dilation, normal RV, mild MR, moderate AS with AVA 1.1 cm^2, mean gradient 9 mmHg (low flow low gradient moderate AS). ASD noted.  - Echo (2/24): EF 30-35% (on Dr. Alford Highland read), mildly dilated LV, normal RV, moderate MR, moderate AS with AVA  0.85 cm , mean gradient 13.5 mmHg (moderate low flow/low gradient aortic stenosis) - RHC/LHC (10/22): mild nonobstructive CAD, left to right atrial level shunt with Qp/Qs 1.43.  - RHC (2/24): RA mean 8, PA 52/21 (mean 31, PCPW mean 20 with prominent v-waves to 39, CO/CI (Fick) 4.92/3.09, PVR 2.2, Qp/Qs 1.29=>suggesting small ASD - TEE 4/24: LVEF 30-35%, LV w/ RWA, RV mildly reduced, "smoke" noted in LAA, RA mildly dilated, small secund ASD, 0.45 cm in diameter, TR mild-mod. Mild-mod aortic valve stenosis. AV  mean gradient with AVA 1.93 cm^2; moderate-severe MR.  - Echo (6/24): EF 30-35%, moderately decreased RV systolic function, moderate functional MR.  8. H/o cholecystectomy 9. Hypothyroidism 10. 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 Aortic stenosis: TEE (10/22) showed EF 30%, moderate LV dilation, normal RV, possible calcified vegetation on the ICD lead, small secundum ASD, peak RV-RA gradient 23 mmHg, low flow/low gradient moderate AS, mild-moderate MR.  - Echo in 10/23 confirmed moderate low flow/low gradient AS.  - Echo (2/24) probably moderate low flow/low gradient - Echo (4/24) AV mean gradient with AVA 1.93 cm^2 13. 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  14. 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  - RHC in 10/22 showed atrial level shunt with Qp/Qs 1.43.  - RHC 2/24 showed Qp/Qs 1.29 - TEE 4/24: small secundum ASD, 0.45 cm in diameter 15. PVCs: Zio monitor (10/22) with 12.6% PVCs. - Intolerant of both amiodarone and mexiletine.  16. Mitral regurgitation: Most studies have shown moderate-severe functional MR.  - She failed PASCAL mTEER in 7/24, will continue medical management.   SH: Married, lives in [place].  No smoking or ETOH.   Family History  Problem Relation Age of Onset  . Hip fracture Mother   . COPD Father   . CAD Sister   . Heart attack Sister    ROS: All systems reviewed and negative except as per HPI.   Current Outpatient Medications  Medication Sig Dispense Refill  . acetaminophen (TYLENOL) 500 MG tablet Take 500 mg by mouth at bedtime as needed for mild pain.    Marland Kitchen allopurinol (ZYLOPRIM) 100 MG tablet take 2 tablets in the morning and 1 tablet in the evening. 100 tablet 11  . Calcium Carb-Cholecalciferol (CALCIUM + VITAMIN D3 PO) Take 1 tablet by mouth daily.    . empagliflozin (JARDIANCE) 10 MG TABS tablet Take 1 tablet (10 mg total) by mouth daily before breakfast. 30  tablet 11  . eplerenone (INSPRA) 25 MG tablet Take 1 tablet (25 mg total) by mouth daily. 90 tablet 3  . Glucosamine-Chondroitin (OSTEO BI-FLEX REGULAR STRENGTH PO) Take 1 tablet by mouth in the morning.    . insulin glargine, 2 Unit Dial, (TOUJEO MAX SOLOSTAR) 300 UNIT/ML Solostar Pen Inject 24 Units into the skin at bedtime. 9 mL 2  . insulin lispro (HUMALOG) 100 UNIT/ML injection Inject 4-8 Units into the skin daily as needed for high blood sugar. Sliding scale Less than 150 =0 150-200=4 Units 201-250=5 Units 251-300=6 Units 301-350=7 Units 351-400=8 Units    . INSULIN SYRINGE .5CC/29G 29G X 1/2" 0.5 ML MISC Use to give yourself daily basal insulin (Lantus or Tuojeo). 100 each 6  . levothyroxine (SYNTHROID) 88 MCG tablet Take 1 tablet (88 mcg total) by mouth daily before breakfast. 90 tablet 3  . loratadine (CLARITIN) 10 MG tablet Take 10 mg by mouth in the morning.    . metoprolol succinate (TOPROL-XL) 50 MG 24 hr tablet Take 1 tablet (50 mg total) by mouth daily. Take with or immediately following a meal. 90 tablet 3  . nitroGLYCERIN (NITROSTAT) 0.4 MG SL tablet Place 1 tablet (0.4 mg total) under the tongue every 5 (five) minutes as needed for chest pain. 90 tablet 3  . Omega-3 Fatty Acids (FISH OIL) 1200 MG CPDR Take 1,200 mg by mouth in the morning.    Marland Kitchen omeprazole (PRILOSEC) 40 MG capsule TAKE 1 CAPSULE BY MOUTH ONCE DAILY. 90 capsule 1  . sacubitril-valsartan (ENTRESTO) 24-26 MG Take 1 tablet by mouth 2 (two) times daily. 60 tablet 11  . warfarin (COUMADIN) 2 MG tablet TAKE 1/2 TO 1 TABLET ONCE A DAY AS DIRECTED BY ANTICOAGULATION. (Patient taking differently: Take 2 mg by mouth daily with supper.) 40 tablet 5  . furosemide (LASIX) 40 MG tablet Take 2 tablets (80 mg total) by mouth 2 (two) times daily. Take (80 mg total) every morning and 4 tablets (80 mg total) every evening    . simvastatin (ZOCOR) 20 MG tablet take 1 tablet every day at 6 in the evening 90 tablet 1   No current  facility-administered medications for this encounter.   BP 104/64   Pulse 86   Wt 56.3 kg (124 lb 3.2 oz)   SpO2 100%   BMI 22.72 kg/m   Wt Readings from Last 3 Encounters:  10/11/22 56.3 kg (124 lb 3.2 oz)  09/16/22 54.4 kg (120 lb)  09/07/22 54.4 kg (120 lb)   PHYSICAL EXAM: General: NAD Neck: JVP 8 cm with HJR, no thyromegaly or thyroid nodule.  Lungs: Clear to auscultation bilaterally with normal respiratory effort. CV: Nondisplaced PMI.  Heart regular S1/S2, no S3/S4, 1/6 HSM apex.  No peripheral edema.  No carotid bruit.  Normal pedal pulses.  Abdomen: Soft, nontender, no hepatosplenomegaly, no distention.  Skin: Intact without lesions or rashes.  Neurologic: Alert and oriented x 3.  Psych: Normal affect. Extremities: No clubbing or cyanosis.  HEENT: Normal.   Assessment/Plan: 1. Chronic systolic CHF: Nonischemic cardiomyopathy.  Low EF for years.  Per patient's report, she had an endomyocardial biopsy at [facility] in 1988 showing myocarditis.  Medication titration has been limited by CKD stage IV. Has MDT CRT-D device, s/p AV nodal ablation with chronic AF. Echo in 7/21 showed EF 20-25%, moderate MR, mild AS.  Echo 12/17/20 showed EF 25-30%, severe LV dilation, RV normal, moderate TR, mod-severe AS with mean gradient 9 mmHg but AVA 0.76 cm^2.  TEE in 10/22 with EF 30%.  LHC in 10/22 with mild nonobstructive CAD.  Echo in 10/23 showed EF 25-30%, severe LV dilation, normal RV, mild MR, moderate AS with AVA 1.1 cm^2, mean gradient 9 mmHg (low flow low gradient moderate AS), small ASD. RHC (2/24) with mildly elevated RA pressure, moderate pulmonary venous hypertension, preserved CO and elevated PCWP with prominent v-waves. Echo 2/24 showed EF 30-35%, likely low flow/low gradient moderate aortic stenosis, moderate MR & ASD visualized.  Echo in 6/24 with EF 30-35%, moderate RV dysfunction, moderate functional MR.  She is mildly volume overloaded on exam and thoracic impedance decreased by  Optivol. NYHA class II-III.  - Increase Lasix to 80 mg bid.  BMET/BNP today and again in 10 days.  - She stopped spironolactone (caused "nightmares.").  I will have her start eplerenone 25 mg daily.  - Continue Jardiance 10 mg daily.  - Continue Entresto 24/26 bid. Would not increase with elevated creatinine.  - Continue Toprol XL 50 mg daily, cannot tolerate higher dose.  2.  Atrial fibrillation: Permanent.  S/p AV nodal ablation with BiV pacing.   - Continue warfarin.  INRs followed by [facility].  3. PVCs: Zio 10/22 with 12.6% PVCs. BiV pacing percentage has been up to >95% but recently down to 75%, ?increasing PVCs again. Pacing rate increased at recent EP visit and now BiV pacing percentage up to 90%. - Continue Toprol. - Unable to tolerate amiodarone in the past.  - She had a rash with mexiletine and stopped it.  4. Aortic stenosis: TEE in 10/22 showed low flow/low gradient moderate aortic stenosis.  Dr. Shirlee Latch discussed with Dr. Excell Seltzer, at that point was not felt to need TAVR. Echo in 10/23 with moderate low flow/low gradient AS. Most recent echo 2/24 suggests probably moderate low flow/low gradient AS. Need TEE to further evaluate. We discussed risks/benefits. She is agreeable to proceed. Discussed with Dr. Shirlee Latch. 5. ASD: Patient has an ASD by echo with L=>R shunting, small.  Qp/Qs by RHC was 1.43. Small on last echo.  - Qp/Qs on RHC 2/24 was 1.29.  - TEE 4/25: LVEF 30-35%, LV w/ RWA, RV mildly reduced, "smoke" noted in LAA, RA mildly dilated, small secund ASD, 0.45 cm in diameter, TR mild-mod. Mild-mod aortic valve stenosis. AV mean gradient with AVA 1.93 cm^2 - Plan for RHC 07/15/22 6. Mitral regurgitation: Moderate on most recent echo.  - Mod to severe on TEE 06/23/22 - Has mTEER consult 07/22/22 with Dr. Lynnette Caffey 7. Gout: Stable.  8. Calcified device lead vegetation: Blood cultures negative, PET-CT in 11/22 not suggestive of active endocarditis.   Follow-up 3-4  months with Dr. Marca Ancona  Shirlee Latch, AGACNP-BC  10/11/2022  [facility] [address] [facility] [place] [address] [phone] (office) [phone] (fax)

## 2022-10-11 NOTE — Progress Notes (Signed)
PCP: Glendale Chard, DO Cardiology: Dr. Diona Browner HF Cardiology: Dr. Shirlee Latch EP: Dr. Ladona Ridgel   72 y.o. with history of CKD stage IV, permanent atrial fibrillation, and a long-standing nonischemic cardiomyopathy was referred by Dr. Diona Browner for CHF clinic evaluation.  She first developed cardiac problems in 1988.  At that time, she was admitted with CHF and eventually underwent myocardial biopsy at [facility], per her report showing myocarditis.  Patient states that her EF has been low since then.  She had MDT CRT-D device implanted.  Echo in 7/21 showed EF 20-25%, moderate MR, mild AS. She is now in permanent atrial fibrillation and is s/p AV nodal ablation with BiV pacing. Elevated creatinine has limited medication management.    Echo in 10/22 showed EF 25-30%, severe LV dilation, RV normal, moderate TR, mod-severe AS with mean gradient 9 mmHg but AVA 0.76 cm^2 (?low gradient severe AS), mild-moderate MR, left=>right shunt possible ASD.  TEE done in 10/22 to more closely assess AS.  This showed EF 30%, moderate LV dilation, normal RV, possible calcified vegetation on the ICD lead, small secundum ASD, peak RV-RA gradient 23 mmHg, low flow/low gradient moderate AS, mild-moderate MR. RHC/LHC was done, showing mild nonobstructive CAD, left to right atrial level shunt with Qp/Qs 1.43.  I reviewed TEE and cath report with Dr. Excell Seltzer.  We decided that AS was not severe enough yet for TAVR and that the ASD was not significant enough to close percutaneously. With possible calcified vegetation on ICD, we drew blood cultures which were negative.  She saw ID and had a PET-CT, which did not suggest active endocarditis.   Patient was found on Zio patch in 10/22 to have 12.6% PVCs, this was thought to be limiting her BiV pacing.  She has a history of intolerance of amiodarone so was started on mexiletine.  She says that this caused a rash so she stopped it and rash resolved. She also decreased Toprol XL to 100 mg daily due to  nightmares when taking 150 mg daily.  She subsequently has decreased Toprol XL to 50 mg daily.   Echo in 10/23 showed EF 25-30%, severe LV dilation, normal RV, mild MR, moderate AS with AVA 1.1 cm^2, mean gradient 9 mmHg (low flow low gradient moderate AS).   Follow up 2/24, volume stable but worse NYHA III symptoms with recent syncope concerning for orthostasis.  Zio 7-day (2/24): 7 runs of NSVT, continuous atrial fibrillation  RHC (2/24) showed elevated PCWP with prominent V-waves, mildly elevated RA pressure, preserved CO, moderate pulmonary venous hypertension, small ASD (Qp/Qs 1.29). Echo showed EF 30-35%, likely low flow/low gradient mild-moderate aortic stenosis.   Follow up with Dr. Ladona Ridgel 05/10/22, with increased PVC burden, her pacing rate was increased from 60-80 and PVCs improved.   TEE 4/24 with LVEF 30-35%, LV w/ RWA, RV mildly reduced, "smoke" noted in LAA, RA mildly dilated, small secundum ASD 0.45 cm in diameter, TR mild-mod. Mild-mod aortic valve stenosis; AV mean gradient with AVA 1.93 cm^2; moderate-severe functional MR.   Echo in 6/24 with EF 30-35%, moderately decreased RV systolic function, moderate functional MR.   PASCAL mTEER was attempted in 7/24.  Unfortunately, the procedure was unsuccessful.    Today she returns for followup of CHF.  Weight up 2 lbs. She is short of breath with longer walks and going up stairs.  She is able to do housework without much problem.  Mild orthopnea.  No lightheadedness, no chest pain. She had nightmares while taking spironolactone and  is now off it with resolution of nightmares.   ECG (personally reviewed): Atrial fibrillation, BiV pacing  Medtronic device interrogation (personally reviewed): 91.4% BiV pacing, fluid index > threshold with decreased thoracic impedance.   Labs (1/21): LDL 118 Labs (9/21): K 4.4, creatinine 1.93 Labs (12/21): K 4, creatinine 1.77 Labs (2/22): TSH normal Labs (5/22): K 5, creatinine 2.42 Labs  (9/22): K 4.3, creatinine 1.73, LDL 90 Labs (10/22): K 4.2, creatinine 2.18 Labs (2/23): K 3.8, creatinine 2.42 Labs (11/23): K 3.6, creatinine 2.22 Labs (2/24): K 3.9, creatinine 2.43 Labs (3/24): K 3.6, SCr 2.01 Labs (4/24): K 3.1, SCr 1.3 Labs (7/24): K 4.8, creatinine 2.18  PMH: 1. Gout 2. Complete heart block: Medtronic CRT device.  3. CKD stage IV: Diabetic nephropathy.  4. Type 2 diabetes 5. Atrial fibrillation: Now permanent.   - AF ablation 4/18.  - AV nodal ablation in 2019 with MDT CRT-D.  6. Remote LV thrombus 7. Chronic systolic CHF: Nonischemic cardiomyopathy. Medtronic CRT-D device.  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  - LHC 2004: Nonobstructive mild CAD.  - Echo (8/19): EF 10-15%, mildly dilated LV, mild-moderately dilated RV with mild-moderately decreased systolic function.  - Echo (7/21): EF 20-25%, low normal RV function, moderate MR, mild AS.  - Echo (10/22): EF 25-30%, severe LV dilation, RV normal, moderate TR, mod-severe AS with mean gradient 9 mmHg but AVA 0.76 cm^2 (?low gradient severe AS), mild-moderate MR, left=>right shunt possible ASD.  - Echo (10/23): EF 25-30%, severe LV dilation, normal RV, mild MR, moderate AS with AVA 1.1 cm^2, mean gradient 9 mmHg (low flow low gradient moderate AS). ASD noted.  - Echo (2/24): EF 30-35% (on Dr. Alford Highland read), mildly dilated LV, normal RV, moderate MR, moderate AS with AVA  0.85 cm , mean gradient 13.5 mmHg (moderate low flow/low gradient aortic stenosis) - RHC/LHC (10/22): mild nonobstructive CAD, left to right atrial level shunt with Qp/Qs 1.43.  - RHC (2/24): RA mean 8, PA 52/21 (mean 31, PCPW mean 20 with prominent v-waves to 39, CO/CI (Fick) 4.92/3.09, PVR 2.2, Qp/Qs 1.29=>suggesting small ASD - TEE 4/24: LVEF 30-35%, LV w/ RWA, RV mildly reduced, "smoke" noted in LAA, RA mildly dilated, small secund ASD, 0.45 cm in diameter, TR mild-mod. Mild-mod aortic valve stenosis. AV  mean gradient with AVA 1.93 cm^2; moderate-severe MR.  - Echo (6/24): EF 30-35%, moderately decreased RV systolic function, moderate functional MR.  8. H/o cholecystectomy 9. Hypothyroidism 10. 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 Aortic stenosis: TEE (10/22) showed EF 30%, moderate LV dilation, normal RV, possible calcified vegetation on the ICD lead, small secundum ASD, peak RV-RA gradient 23 mmHg, low flow/low gradient moderate AS, mild-moderate MR.  - Echo in 10/23 confirmed moderate low flow/low gradient AS.  - Echo (2/24) probably moderate low flow/low gradient - Echo (4/24) AV mean gradient with AVA 1.93 cm^2 13. 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  14. 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  - RHC in 10/22 showed atrial level shunt with Qp/Qs 1.43.  - RHC 2/24 showed Qp/Qs 1.29 - TEE 4/24: small secundum ASD, 0.45 cm in diameter 15. PVCs: Zio monitor (10/22) with 12.6% PVCs. - Intolerant of both amiodarone and mexiletine.  16. Mitral regurgitation: Most studies have shown moderate-severe functional MR.  - She failed PASCAL mTEER in 7/24, will continue medical management.   SH: Married, lives in [place].  No smoking or ETOH.   Family History  Problem Relation Age of Onset   Hip fracture Mother    COPD Father    CAD Sister    Heart attack Sister    ROS: All systems reviewed and negative except as per HPI.   Current Outpatient Medications  Medication Sig Dispense Refill   acetaminophen (TYLENOL) 500 MG tablet Take 500 mg by mouth at bedtime as needed for mild pain.     allopurinol (ZYLOPRIM) 100 MG tablet take 2 tablets in the morning and 1 tablet in the evening. 100 tablet 11   Calcium Carb-Cholecalciferol (CALCIUM + VITAMIN D3 PO) Take 1 tablet by mouth daily.     empagliflozin (JARDIANCE) 10 MG TABS tablet Take 1 tablet (10 mg total) by mouth daily before breakfast. 30 tablet 11    eplerenone (INSPRA) 25 MG tablet Take 1 tablet (25 mg total) by mouth daily. 90 tablet 3   Glucosamine-Chondroitin (OSTEO BI-FLEX REGULAR STRENGTH PO) Take 1 tablet by mouth in the morning.     insulin glargine, 2 Unit Dial, (TOUJEO MAX SOLOSTAR) 300 UNIT/ML Solostar Pen Inject 24 Units into the skin at bedtime. 9 mL 2   insulin lispro (HUMALOG) 100 UNIT/ML injection Inject 4-8 Units into the skin daily as needed for high blood sugar. Sliding scale Less than 150 =0 150-200=4 Units 201-250=5 Units 251-300=6 Units 301-350=7 Units 351-400=8 Units     INSULIN SYRINGE .5CC/29G 29G X 1/2" 0.5 ML MISC Use to give yourself daily basal insulin (Lantus or Tuojeo). 100 each 6   levothyroxine (SYNTHROID) 88 MCG tablet Take 1 tablet (88 mcg total) by mouth daily before breakfast. 90 tablet 3   loratadine (CLARITIN) 10 MG tablet Take 10 mg by mouth in the morning.     metoprolol succinate (TOPROL-XL) 50 MG 24 hr tablet Take 1 tablet (50 mg total) by mouth daily. Take with or immediately following a meal. 90 tablet 3   nitroGLYCERIN (NITROSTAT) 0.4 MG SL tablet Place 1 tablet (0.4 mg total) under the tongue every 5 (five) minutes as needed for chest pain. 90 tablet 3   Omega-3 Fatty Acids (FISH OIL) 1200 MG CPDR Take 1,200 mg by mouth in the morning.     omeprazole (PRILOSEC) 40 MG capsule TAKE 1 CAPSULE BY MOUTH ONCE DAILY. 90 capsule 1   sacubitril-valsartan (ENTRESTO) 24-26 MG Take 1 tablet by mouth 2 (two) times daily. 60 tablet 11   warfarin (COUMADIN) 2 MG tablet TAKE 1/2 TO 1 TABLET ONCE A DAY AS DIRECTED BY ANTICOAGULATION. (Patient taking differently: Take 2 mg by mouth daily with supper.) 40 tablet 5   furosemide (LASIX) 40 MG tablet Take 2 tablets (80 mg total) by mouth 2 (two) times daily. Take (80 mg total) every morning and 4 tablets (80 mg total) every evening     simvastatin (ZOCOR) 20 MG tablet take 1 tablet every day at 6 in the evening 90 tablet 1   No current facility-administered  medications for this encounter.   BP 104/64   Pulse 86   Wt 56.3 kg (124 lb 3.2 oz)   SpO2 100%   BMI 22.72 kg/m   Wt Readings from Last 3 Encounters:  10/11/22 56.3 kg (124 lb 3.2 oz)  09/16/22 54.4 kg (120 lb)  09/07/22 54.4 kg (120 lb)   PHYSICAL EXAM: General: NAD Neck: JVP 8 cm with HJR, no thyromegaly or thyroid nodule.  Lungs: Clear to auscultation bilaterally with normal respiratory effort. CV: Nondisplaced PMI.  Heart regular S1/S2, no S3/S4, 1/6 HSM apex.  No peripheral edema.  No carotid bruit.  Normal pedal pulses.  Abdomen: Soft, nontender, no hepatosplenomegaly, no distention.  Skin: Intact without lesions or rashes.  Neurologic: Alert and oriented x 3.  Psych: Normal affect. Extremities: No clubbing or cyanosis.  HEENT: Normal.   Assessment/Plan: 1. Chronic systolic CHF: Nonischemic cardiomyopathy.  Low EF for years.  Per patient's report, she had an endomyocardial biopsy at [facility] in 1988 showing myocarditis.  Medication titration has been limited by CKD stage IV. Has MDT CRT-D device, s/p AV nodal ablation with chronic AF. Echo in 7/21 showed EF 20-25%, moderate MR, mild AS.  Echo 12/17/20 showed EF 25-30%, severe LV dilation, RV normal, moderate TR, mod-severe AS with mean gradient 9 mmHg but AVA 0.76 cm^2.  TEE in 10/22 with EF 30%.  LHC in 10/22 with mild nonobstructive CAD.  Echo in 10/23 showed EF 25-30%, severe LV dilation, normal RV, mild MR, moderate AS with AVA 1.1 cm^2, mean gradient 9 mmHg (low flow low gradient moderate AS), small ASD. RHC (2/24) with mildly elevated RA pressure, moderate pulmonary venous hypertension, preserved CO and elevated PCWP with prominent v-waves. Echo 2/24 showed EF 30-35%, likely low flow/low gradient moderate aortic stenosis, moderate MR & ASD visualized.  Echo in 6/24 with EF 30-35%, moderate RV dysfunction, moderate functional MR.  She is mildly volume overloaded on exam and thoracic impedance decreased by Optivol. NYHA class  II-III.  - Increase Lasix to 80 mg bid.  BMET/BNP today and again in 10 days.  - She stopped spironolactone (caused "nightmares.").  I will have her start eplerenone 25 mg daily.  - Continue Jardiance 10 mg daily.  - Continue Entresto 24/26 bid. Would not increase with elevated creatinine.  - Continue Toprol XL 50 mg daily, cannot tolerate higher dose.  2.  Atrial fibrillation: Permanent.  S/p AV nodal ablation with BiV pacing.   - Continue warfarin.  INRs followed by [facility].  3. PVCs: Zio 10/22 with 12.6% PVCs. BiV pacing now improved at 91%.   - Continue Toprol. - Unable to tolerate amiodarone in the past.  - She had a rash with mexiletine and stopped it.  4. Aortic stenosis: Probably moderate low flow/low gradient aortic stenosis.  - Continue to follow.  5. ASD: Patient has a small ASD by echo with L=>R shunting, small. Qp/Qs on RHC 2/24 was 1.29.  Suspect not large enough to repair.  6. Mitral regurgitation: Moderate-severe functional MR.  Attempted PASCAL mTEER in 7/24 but procedure failed.  Will have to continue medical management.   7. Gout: Stable.  8. Calcified device lead vegetation: Blood cultures negative, PET-CT in 11/22 not suggestive of active endocarditis.   Follow-up 3 wks with APP to reassess volume status.   Marca Ancona,  10/11/2022  [facility] [id number] [facility] [place] [address] [phone] (office) [phone] (fax)

## 2022-10-12 ENCOUNTER — Ambulatory Visit: Payer: PPO | Attending: Cardiology | Admitting: *Deleted

## 2022-10-12 DIAGNOSIS — I513 Intracardiac thrombosis, not elsewhere classified: Secondary | ICD-10-CM | POA: Diagnosis not present

## 2022-10-12 DIAGNOSIS — Z5181 Encounter for therapeutic drug level monitoring: Secondary | ICD-10-CM | POA: Diagnosis not present

## 2022-10-12 DIAGNOSIS — I4891 Unspecified atrial fibrillation: Secondary | ICD-10-CM | POA: Diagnosis not present

## 2022-10-12 LAB — POCT INR: INR: 2.4 (ref 2.0–3.0)

## 2022-10-12 NOTE — Patient Instructions (Signed)
Continue warfarin 2 tablets daily except 1 tablet on Mondays, Wednesdays and Fridays Recheck in 4 weeks

## 2022-10-17 ENCOUNTER — Other Ambulatory Visit (HOSPITAL_COMMUNITY): Payer: Self-pay

## 2022-10-19 ENCOUNTER — Other Ambulatory Visit (HOSPITAL_COMMUNITY): Payer: Self-pay

## 2022-10-19 DIAGNOSIS — I5022 Chronic systolic (congestive) heart failure: Secondary | ICD-10-CM

## 2022-10-19 NOTE — Addendum Note (Signed)
Addended by: Rob Bunting R on: 10/19/2022 04:44 PM   Modules accepted: Orders

## 2022-10-20 NOTE — Progress Notes (Signed)
No ICM remote transmission received for 10/17/2022 and next ICM transmission scheduled for 10/24/2022.

## 2022-10-21 ENCOUNTER — Other Ambulatory Visit (HOSPITAL_COMMUNITY): Payer: PPO

## 2022-10-22 LAB — BASIC METABOLIC PANEL
BUN/Creatinine Ratio: 19 (ref 12–28)
BUN: 45 mg/dL — ABNORMAL HIGH (ref 8–27)
CO2: 23 mmol/L (ref 20–29)
Calcium: 8.4 mg/dL — ABNORMAL LOW (ref 8.7–10.3)
Chloride: 94 mmol/L — ABNORMAL LOW (ref 96–106)
Creatinine, Ser: 2.35 mg/dL — ABNORMAL HIGH (ref 0.57–1.00)
Glucose: 234 mg/dL — ABNORMAL HIGH (ref 70–99)
Potassium: 4.5 mmol/L (ref 3.5–5.2)
Sodium: 136 mmol/L (ref 134–144)
eGFR: 21 mL/min/{1.73_m2} — ABNORMAL LOW (ref >59)

## 2022-10-24 ENCOUNTER — Ambulatory Visit: Payer: PPO | Attending: Internal Medicine

## 2022-10-24 DIAGNOSIS — Z95810 Presence of automatic (implantable) cardiac defibrillator: Secondary | ICD-10-CM | POA: Diagnosis not present

## 2022-10-24 DIAGNOSIS — I5022 Chronic systolic (congestive) heart failure: Secondary | ICD-10-CM | POA: Diagnosis not present

## 2022-10-26 NOTE — Progress Notes (Signed)
EPIC Encounter for ICM Monitoring  Patient Name: Emily [name] is a 72 y.o. female Date: 10/26/2022 Primary Care Physican: Glendale Chard, DO Primary Cardiologist: [facility] Electrophysiologist: Ladona Ridgel Nephrologist: [facility]-Dr Lacy Duverney  Bi-V Pacing: 90.2%  06/08/2022 Weight: 122-123 lbs 09/20/2022 Weight: 119-121 lbs 10/03/2022 Weight: 120 lbs       Spoke with patient and heart failure questions reviewed.  Transmission results reviewed.  Pt asymptomatic for fluid accumulation.  Reports feeling well at this time and voices no complaints.           Optivol Thoracic impedance trending close to baseline since Furosemide 80 mg twice a day.    Prescribed: Furosemide 80 mg Take 1 tablet (80 mg total) by mouth twice a day     Eplerenone 25 mg take 1 tablet by mouth daily     Labs: 10/21/2022 Creatinine 2.35, BUN 45, Potassium 4.5, Sodium 136, GFR 21  10/11/2022 Creatinine 2.36, BUN 35, Potassium 4.1, Sodium 138  09/07/2022 Creatinine 2.42, BUN 60, Potassium 4.5, Sodium 132, GFR 20 08/24/2022 Creatinine 2.37, BUN 45, Potassium 4.3, Sodium 133, GFR 21 07/15/2022 Potassium 2.7, Sodium 140  07/06/2022 Creatinine 2.08, BUN 32, Potassium 3.6, Sodium 134, GFR 25  06/23/2022 Creatinine 1.30, BUN 30, Potassium 3.1, Sodium 145  05/18/2022 Creatinine 2.01, BUN 43, Potassium 3.6, Sodium 134, GFR 26 04/19/2022 Creatinine 2.12, BUN 32, Potassium 4.0, Sodium 136, GFR 24 01/24/2022 Creatinine 2.31, BUN 31, Potassium 5.0, Sodium 136, GFR 22 A complete set of results can be found in Results Review.   Recommendations:  No changes and encouraged to call if experiencing any fluid symptoms.   Follow-up plan: ICM clinic phone appointment on 12/06/2022.  91 day device clinic remote transmission 12/05/2022.      EP/Cardiology Office Visits:  10/27/2022 with Dr Ladona Ridgel.   11/03/2022 with HF clinic.       Copy of ICM check sent to Dr. Ladona Ridgel.  3 month ICM trend: 10/24/2022.    12-14 Month  ICM trend:     Karie Soda, RN 10/26/2022 3:07 PM

## 2022-10-27 ENCOUNTER — Ambulatory Visit: Payer: PPO | Attending: Internal Medicine | Admitting: Internal Medicine

## 2022-10-27 ENCOUNTER — Encounter: Payer: Self-pay | Admitting: Internal Medicine

## 2022-10-27 VITALS — BP 102/74 | HR 89 | Ht 62.0 in | Wt 123.4 lb

## 2022-10-27 DIAGNOSIS — I420 Dilated cardiomyopathy: Secondary | ICD-10-CM | POA: Diagnosis not present

## 2022-10-27 LAB — CUP PACEART INCLINIC DEVICE CHECK
Date Time Interrogation Session: 20240829094352
Implantable Lead Connection Status: 753985
Implantable Lead Connection Status: 753985
Implantable Lead Connection Status: 753985
Implantable Lead Implant Date: 20050218
Implantable Lead Implant Date: 20050218
Implantable Lead Implant Date: 20050218
Implantable Lead Location: 753858
Implantable Lead Location: 753859
Implantable Lead Location: 753860
Implantable Lead Model: 4194
Implantable Lead Model: 5076
Implantable Lead Model: 6947
Implantable Pulse Generator Implant Date: 20230313

## 2022-10-27 NOTE — Progress Notes (Signed)
HPI Emily [name] returns today for followup. She is a pleasant 72 yo woman with a h/o both atrial and ventricular arrhythmias. She has undergone AV node ablation. She has persistent atrial fib. She has a h/o dietary indiscretion and has undergone adjustments of her meds and dietary change.  No ICD therapies. She has undergone workup which demonstrates mod MR, pulm HTN, elevated filling pressures and increased PVC's. She is chronically in atrial fib.  She underwent attempted MV repair but this was unsuccessful and the mitral clips were not deployed.  Allergies  Allergen Reactions   Amiodarone Rash   Other Palpitations    PT STATES SHE GETS REALLY HOT , DIZZY HEADED, HAS PASSED OUT TWICE , AND ITCHING WITH IV DYE   Codeine Other (See Comments)    Euphoria, hallucinations   Turmeric     Pt went into AFIB    Colchicine Rash   Iodine Rash    topical   Ivp Dye [Iodinated Contrast Media] Rash   Mexitil [Mexiletine] Rash     Current Outpatient Medications  Medication Sig Dispense Refill   acetaminophen (TYLENOL) 500 MG tablet Take 500 mg by mouth at bedtime as needed for mild pain.     allopurinol (ZYLOPRIM) 100 MG tablet take 2 tablets in the morning and 1 tablet in the evening. 100 tablet 11   Calcium Carb-Cholecalciferol (CALCIUM + VITAMIN D3 PO) Take 1 tablet by mouth daily.     empagliflozin (JARDIANCE) 10 MG TABS tablet Take 1 tablet (10 mg total) by mouth daily before breakfast. 30 tablet 11   eplerenone (INSPRA) 25 MG tablet Take 1 tablet (25 mg total) by mouth daily. 90 tablet 3   furosemide (LASIX) 40 MG tablet Take 2 tablets (80 mg total) by mouth 2 (two) times daily. Take (80 mg total) every morning and 4 tablets (80 mg total) every evening     insulin glargine, 2 Unit Dial, (TOUJEO MAX SOLOSTAR) 300 UNIT/ML Solostar Pen Inject 24 Units into the skin at bedtime. 9 mL 2   insulin lispro (HUMALOG) 100 UNIT/ML injection Inject 4-8 Units into the skin daily as needed for high  blood sugar. Sliding scale Less than 150 =0 150-200=4 Units 201-250=5 Units 251-300=6 Units 301-350=7 Units 351-400=8 Units     INSULIN SYRINGE .5CC/29G 29G X 1/2" 0.5 ML MISC Use to give yourself daily basal insulin (Lantus or Tuojeo). 100 each 6   levothyroxine (SYNTHROID) 88 MCG tablet Take 1 tablet (88 mcg total) by mouth daily before breakfast. 90 tablet 3   loratadine (CLARITIN) 10 MG tablet Take 10 mg by mouth in the morning.     metoprolol succinate (TOPROL-XL) 50 MG 24 hr tablet Take 1 tablet (50 mg total) by mouth daily. Take with or immediately following a meal. 90 tablet 3   nitroGLYCERIN (NITROSTAT) 0.4 MG SL tablet Place 1 tablet (0.4 mg total) under the tongue every 5 (five) minutes as needed for chest pain. 90 tablet 3   Omega-3 Fatty Acids (FISH OIL) 1200 MG CPDR Take 1,200 mg by mouth in the morning.     omeprazole (PRILOSEC) 40 MG capsule TAKE 1 CAPSULE BY MOUTH ONCE DAILY. 90 capsule 1   sacubitril-valsartan (ENTRESTO) 24-26 MG Take 1 tablet by mouth 2 (two) times daily. 60 tablet 11   simvastatin (ZOCOR) 20 MG tablet take 1 tablet every day at 6 in the evening 90 tablet 1   warfarin (COUMADIN) 2 MG tablet TAKE 1/2 TO 1 TABLET ONCE  A DAY AS DIRECTED BY ANTICOAGULATION. (Patient taking differently: Take 2 mg by mouth daily with supper.) 40 tablet 5   Glucosamine-Chondroitin (OSTEO BI-FLEX REGULAR STRENGTH PO) Take 1 tablet by mouth in the morning. (Patient not taking: Reported on 10/27/2022)     No current facility-administered medications for this visit.     Past Medical History:  Diagnosis Date   Acute pain of right shoulder 03/22/2019   Acute renal failure (HCC) 04/15/2012   Last Assessment & Plan:  Superimposed on CK D stage III, Baseline Cr ~2-2.5, remains above baseline, felt to be due to Cardiorenal syndrome, creatinine trending down at time of discharge Negative urine eos and no urinary retention ACE inhibitor discontinued Follows with Dr Julien Girt as outpatient,  to have labs drawn 08/15/16 and results sent to [facility] office for review   AICD (automatic cardioverter/defibrillator) present    Anemia 12/29/2016   Aortic stenosis    Arthritis    hands   ASD (atrial septal defect)    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   Asthma    as a child   Atrial fibrillation with RVR (HCC)    Biventricular ICD (implantable cardioverter-defibrillator) in place    CHF (congestive heart failure) (HCC)    CKD (chronic kidney disease), stage IV (HCC)    Colitis 09/11/2012   Complete heart block (HCC)    Dyspnea    Essential hypertension    GERD (gastroesophageal reflux disease)    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 Physical exam revealed cerumen impaction of left ear canal. Relieved by flushing.    Hypomagnesemia 04/10/2016   Hyponatremia 04/10/2016   Last Assessment & Plan:  - improved after tolvaptan 6/29, chiefly driven by CHF, further improvement with ongoing IV Lasix - continue low Na and fluid restriction - compliance with sodium restriction a major barrier to keeping patient out of hospital and compensated, remains resistant to this philosophy while inpatient - follows with Dr Julien Girt as outpatient, Neph consult not indicated at this ti   Hypothyroidism    ICD (implantable cardioverter-defibrillator) battery depletion 04/21/2014   Localized macular rash 08/11/2016   Last Assessment & Plan:  Seems to trend with heart failure, though she has more findings behind bilateral knees and up into her thigh/groin area than when I last saw her Would use steroid cream for now, consider alternative diagnosis if no improvement   Long-term insulin use in type 2 diabetes (HCC) 04/10/2016   Last Assessment & Plan:  Hypoglycemia resolved,  continue SSI and hypoglycemia protocol   Mitral regurgitation    Nonischemic cardiomyopathy (HCC)    On amiodarone therapy 08/21/2017   Persistent atrial fibrillation (HCC)    AV node ablation 2019   Pneumonia    PONV (postoperative nausea and vomiting)    has awakened during surgery   Restless leg syndrome 03/22/2019   Type 2 diabetes mellitus (HCC)     ROS:   All systems reviewed and negative except as noted in the HPI.   Past Surgical History:  Procedure Laterality Date   ABLATION     AV NODE ABLATION N/A 08/08/2017   Procedure: AV NODE ABLATION;  Surgeon: Hillis Range, MD;  Location: [facility] INVASIVE CV LAB;  Service: Cardiovascular;  Laterality: N/A;   BREAST BIOPSY     CARDIAC DEFIBRILLATOR PLACEMENT  2005   MDT Viva XT CRT-D BiV ICD implanted by Dr Sharlot Gowda in [employer] [place]   CATARACT EXTRACTION Bilateral    CHOLECYSTECTOMY     ICD GENERATOR CHANGEOUT N/A 05/10/2021   Procedure: ICD GENERATOR CHANGEOUT;  Surgeon: Marinus Maw, MD;  Location: [facility] INVASIVE CV LAB;  Service: Cardiovascular;  Laterality: N/A;   MITRAL VALVE REPAIR N/A 08/31/2022   Procedure: MITRAL VALVE REPAIR;  Surgeon: [name], MD;  Location: [facility] INVASIVE CV LAB;  Service: Cardiovascular;  Laterality: N/A;   RIGHT HEART CATH N/A 04/28/2022   Procedure: RIGHT HEART CATH;  Surgeon: Laurey Morale, MD;  Location: [facility] INVASIVE CV LAB;  Service: Cardiovascular;  Laterality: N/A;   RIGHT HEART CATH N/A 07/15/2022   Procedure: RIGHT HEART CATH;  Surgeon: Laurey Morale, MD;  Location: [facility] INVASIVE CV LAB;  Service: Cardiovascular;  Laterality: N/A;   RIGHT HEART CATH AND CORONARY ANGIOGRAPHY N/A 12/17/2020   Procedure: RIGHT HEART CATH AND CORONARY ANGIOGRAPHY;  Surgeon: Laurey Morale, MD;  Location: [facility] INVASIVE CV LAB;  Service: Cardiovascular;  Laterality: N/A;   TEE WITHOUT CARDIOVERSION N/A 12/17/2020   Procedure: TRANSESOPHAGEAL ECHOCARDIOGRAM (TEE);  Surgeon: Laurey Morale, MD;  Location: [facility]  ENDOSCOPY;  Service: Cardiovascular;  Laterality: N/A;   TEE WITHOUT CARDIOVERSION N/A 06/23/2022   Procedure: TRANSESOPHAGEAL ECHOCARDIOGRAM;  Surgeon: Laurey Morale, MD;  Location: [facility] INVASIVE CV LAB;  Service: Cardiovascular;  Laterality: N/A;   TEE WITHOUT CARDIOVERSION N/A 08/31/2022   Procedure: TRANSESOPHAGEAL ECHOCARDIOGRAM;  Surgeon: [name], MD;  Location: [facility] INVASIVE CV LAB;  Service: Cardiovascular;  Laterality: N/A;   THYROIDECTOMY     TOTAL VAGINAL HYSTERECTOMY       Family History  Problem Relation Age of Onset   Hip fracture Mother    COPD Father    CAD Sister    Heart attack Sister      Social History   Socioeconomic History   Marital status: Married    Spouse name: Not on file   Number of children: Not on file   Years of education: Not on file   Highest education level: Not on file  Occupational History   Not on file  Tobacco Use   Smoking status: Never    Passive exposure: Never   Smokeless tobacco: Never  Vaping Use   Vaping status: Never Used  Substance and Sexual Activity   Alcohol use: No   Drug use: No   Sexual activity: Not on file  Other Topics Concern   Not on file  Social History Narrative   Not on file   Social Determinants of Health   Financial Resource Strain: Low Risk  (09/16/2022)   Overall Financial Resource Strain (CARDIA)    Difficulty of Paying Living Expenses: Not hard at all  Food Insecurity: No Food Insecurity (09/16/2022)   Hunger Vital Sign    Worried About Running Out of Food in the Last Year: Never true    Ran Out of Food in the Last Year: Never true  Transportation Needs: No Transportation Needs (09/16/2022)   PRAPARE - Administrator, Civil Service (Medical): No    Lack of Transportation (Non-Medical): No  Physical Activity: Insufficiently Active (09/16/2022)   Exercise Vital Sign    Days of Exercise per Week: 3 days    Minutes of Exercise per Session: 10 min  Stress: No Stress Concern  Present  (09/16/2022)   [employer] of Occupational Health - Occupational Stress Questionnaire    Feeling of Stress : Only a little  Social Connections: Moderately Integrated (09/16/2022)   Social Connection and Isolation Panel [NHANES]    Frequency of Communication with Friends and Family: More than three times a week    Frequency of Social Gatherings with Friends and Family: Three times a week    Attends Religious Services: 1 to 4 times per year    Active Member of Clubs or Organizations: No    Attends Banker Meetings: Never    Marital Status: Married  Catering manager Violence: Not At Risk (09/16/2022)   Humiliation, Afraid, Rape, and Kick questionnaire    Fear of Current or Ex-Partner: No    Emotionally Abused: No    Physically Abused: No    Sexually Abused: No     BP 102/74   Pulse 89   Ht 5\' 2"  (1.575 m)   Wt 123 lb 6.4 oz (56 kg)   SpO2 99%   BMI 22.57 kg/m   Physical Exam:  Well appearing NAD HEENT: Unremarkable Neck:  No JVD, no thyromegally Lymphatics:  No adenopathy Back:  No CVA tenderness Lungs:  Clear HEART:  Regular rate rhythm, no murmurs, no rubs, no clicks Abd:  soft, positive bowel sounds, no organomegally, no rebound, no guarding Ext:  2 plus pulses, no edema, no cyanosis, no clubbing Skin:  No rashes no nodules Neuro:  CN II through XII intact, motor grossly intact  DEVICE  Normal device function.  See PaceArt for details.   Assess/Plan:  Atrial fib - she is s/p AV node RFA and her rates are well controlled. PVC's - she has had about 8% PVC's since increasing her pacing rate. Chronic systolic heart failure - her symptoms are class 2B. Her optivol is improved with an increase in lasix. I'll defer adding vercuvo to Dr. Shirlee Latch. ICD - her optivol is back down. Her LV lead is working normally.      Sharlot Gowda Geneviene Tesch,MD

## 2022-10-27 NOTE — Patient Instructions (Signed)

## 2022-11-03 ENCOUNTER — Encounter (HOSPITAL_COMMUNITY): Payer: PPO

## 2022-11-09 ENCOUNTER — Ambulatory Visit: Payer: PPO | Attending: Internal Medicine | Admitting: *Deleted

## 2022-11-09 DIAGNOSIS — I513 Intracardiac thrombosis, not elsewhere classified: Secondary | ICD-10-CM

## 2022-11-09 DIAGNOSIS — Z5181 Encounter for therapeutic drug level monitoring: Secondary | ICD-10-CM | POA: Diagnosis not present

## 2022-11-09 DIAGNOSIS — I4891 Unspecified atrial fibrillation: Secondary | ICD-10-CM

## 2022-11-09 LAB — POCT INR: INR: 3.3 — AB (ref 2.0–3.0)

## 2022-11-09 NOTE — Patient Instructions (Signed)
Hold warfarin tonight then resume 2 tablets daily except 1 tablet on Mondays, Wednesdays and Fridays Recheck in 3 weeks

## 2022-11-15 ENCOUNTER — Other Ambulatory Visit (HOSPITAL_COMMUNITY): Payer: Self-pay

## 2022-11-18 ENCOUNTER — Encounter (HOSPITAL_COMMUNITY): Payer: Self-pay

## 2022-11-18 ENCOUNTER — Ambulatory Visit (HOSPITAL_COMMUNITY)
Admission: RE | Admit: 2022-11-18 | Discharge: 2022-11-18 | Disposition: A | Discharge status: Home / Self Care | Payer: PPO | Source: Ambulatory Visit | Attending: Family Medicine

## 2022-11-18 ENCOUNTER — Other Ambulatory Visit (HOSPITAL_COMMUNITY): Payer: Self-pay

## 2022-11-18 VITALS — BP 124/86 | HR 83 | Wt 125.4 lb

## 2022-11-18 DIAGNOSIS — I493 Ventricular premature depolarization: Secondary | ICD-10-CM

## 2022-11-18 DIAGNOSIS — I330 Acute and subacute infective endocarditis: Secondary | ICD-10-CM

## 2022-11-18 DIAGNOSIS — I4821 Permanent atrial fibrillation: Secondary | ICD-10-CM | POA: Diagnosis not present

## 2022-11-18 DIAGNOSIS — I272 Pulmonary hypertension, unspecified: Secondary | ICD-10-CM | POA: Diagnosis not present

## 2022-11-18 DIAGNOSIS — Q211 Atrial septal defect, unspecified: Secondary | ICD-10-CM

## 2022-11-18 DIAGNOSIS — I080 Rheumatic disorders of both mitral and aortic valves: Secondary | ICD-10-CM | POA: Diagnosis not present

## 2022-11-18 DIAGNOSIS — N184 Chronic kidney disease, stage 4 (severe): Secondary | ICD-10-CM | POA: Diagnosis not present

## 2022-11-18 DIAGNOSIS — I5022 Chronic systolic (congestive) heart failure: Secondary | ICD-10-CM | POA: Diagnosis present

## 2022-11-18 DIAGNOSIS — Z79899 Other long term (current) drug therapy: Secondary | ICD-10-CM | POA: Diagnosis not present

## 2022-11-18 DIAGNOSIS — I4819 Other persistent atrial fibrillation: Secondary | ICD-10-CM

## 2022-11-18 DIAGNOSIS — I2510 Atherosclerotic heart disease of native coronary artery without angina pectoris: Secondary | ICD-10-CM | POA: Insufficient documentation

## 2022-11-18 DIAGNOSIS — I428 Other cardiomyopathies: Secondary | ICD-10-CM | POA: Insufficient documentation

## 2022-11-18 DIAGNOSIS — M109 Gout, unspecified: Secondary | ICD-10-CM | POA: Diagnosis not present

## 2022-11-18 DIAGNOSIS — I350 Nonrheumatic aortic (valve) stenosis: Secondary | ICD-10-CM

## 2022-11-18 DIAGNOSIS — Z7901 Long term (current) use of anticoagulants: Secondary | ICD-10-CM | POA: Insufficient documentation

## 2022-11-18 DIAGNOSIS — I340 Nonrheumatic mitral (valve) insufficiency: Secondary | ICD-10-CM

## 2022-11-18 DIAGNOSIS — Z7984 Long term (current) use of oral hypoglycemic drugs: Secondary | ICD-10-CM | POA: Diagnosis not present

## 2022-11-18 NOTE — Patient Instructions (Signed)
No Labs done today.   No medication changes were made. Please continue all current medications as prescribed.  Your physician recommends that you schedule a follow-up appointment in: 3-4 months with Dr. Shirlee Latch. Please contact our office in November to schedule a January 2025 appointment.   If you have any questions or concerns before your next appointment please send [place] a message through [place] or call our office at [phone].    TO LEAVE A MESSAGE FOR THE NURSE SELECT OPTION 2, PLEASE LEAVE A MESSAGE INCLUDING: YOUR NAME DATE OF BIRTH CALL BACK NUMBER REASON FOR CALL**this is important as we prioritize the call backs  YOU WILL RECEIVE A CALL BACK THE SAME DAY AS LONG AS YOU CALL BEFORE 4:00 PM   Do the following things EVERYDAY: Weigh yourself in the morning before breakfast. Write it down and keep it in a log. Take your medicines as prescribed Eat low salt foods--Limit salt (sodium) to 2000 mg per day.  Stay as active as you can everyday Limit all fluids for the day to less than 2 liters   At the [facility], you and your health needs are our priority. As part of our continuing mission to provide you with exceptional heart care, we have created designated Provider Care Teams. These Care Teams include your primary Cardiologist (physician) and Advanced Practice Providers (APPs- Physician Assistants and Nurse Practitioners) who all work together to provide you with the care you need, when you need it.   You may see any of the following providers on your designated Care Team at your next follow up: Dr Arvilla Meres Dr Marca Ancona Dr. Marcos Eke, NP Robbie Lis, [place] [facility] [place], [place] Brynda Peon, NP Karle Plumber, PharmD   Please be sure to bring in all your medications bottles to every appointment.    Thank you for choosing [facility]

## 2022-11-18 NOTE — Progress Notes (Signed)
PCP: Glendale Chard, DO Cardiology: Dr. Diona Browner HF Cardiology: Dr. Shirlee Latch EP: Dr. Ladona Ridgel   72 y.o. with history of CKD stage IV, permanent atrial fibrillation, and a long-standing nonischemic cardiomyopathy was referred by Dr. Diona Browner for CHF clinic evaluation.  She first developed cardiac problems in 1988.  At that time, she was admitted with CHF and eventually underwent myocardial biopsy at [facility], per her report showing myocarditis.  Patient states that her EF has been low since then.  She had MDT CRT-D device implanted.  Echo in 7/21 showed EF 20-25%, moderate MR, mild AS. She is now in permanent atrial fibrillation and is s/p AV nodal ablation with BiV pacing. Elevated creatinine has limited medication management.    Echo in 10/22 showed EF 25-30%, severe LV dilation, RV normal, moderate TR, mod-severe AS with mean gradient 9 mmHg but AVA 0.76 cm^2 (?low gradient severe AS), mild-moderate MR, left=>right shunt possible ASD.  TEE done in 10/22 to more closely assess AS.  This showed EF 30%, moderate LV dilation, normal RV, possible calcified vegetation on the ICD lead, small secundum ASD, peak RV-RA gradient 23 mmHg, low flow/low gradient moderate AS, mild-moderate MR. RHC/LHC was done, showing mild nonobstructive CAD, left to right atrial level shunt with Qp/Qs 1.43.  I reviewed TEE and cath report with Dr. Excell Seltzer.  We decided that AS was not severe enough yet for TAVR and that the ASD was not significant enough to close percutaneously. With possible calcified vegetation on ICD, we drew blood cultures which were negative.  She saw ID and had a PET-CT, which did not suggest active endocarditis.   Patient was found on Zio patch in 10/22 to have 12.6% PVCs, this was thought to be limiting her BiV pacing.  She has a history of intolerance of amiodarone so was started on mexiletine.  She says that this caused a rash so she stopped it and rash resolved. She also decreased Toprol XL to 100 mg daily due to  nightmares when taking 150 mg daily.  She subsequently has decreased Toprol XL to 50 mg daily.   Echo in 10/23 showed EF 25-30%, severe LV dilation, normal RV, mild MR, moderate AS with AVA 1.1 cm^2, mean gradient 9 mmHg (low flow low gradient moderate AS).   Follow up 2/24, volume stable but worse NYHA III symptoms with recent syncope concerning for orthostasis.  Zio 7-day (2/24): 7 runs of NSVT, continuous atrial fibrillation  RHC (2/24) showed elevated PCWP with prominent V-waves, mildly elevated RA pressure, preserved CO, moderate pulmonary venous hypertension, small ASD (Qp/Qs 1.29). Echo showed EF 30-35%, likely low flow/low gradient mild-moderate aortic stenosis.   Follow up with Dr. Ladona Ridgel 05/10/22, with increased PVC burden, her pacing rate was increased from 60-80 and PVCs improved.   TEE 4/24 with LVEF 30-35%, LV w/ RWA, RV mildly reduced, "smoke" noted in LAA, RA mildly dilated, small secundum ASD 0.45 cm in diameter, TR mild-mod. Mild-mod aortic valve stenosis; AV mean gradient with AVA 1.93 cm^2; moderate-severe functional MR.   Echo in 6/24 with EF 30-35%, moderately decreased RV systolic function, moderate functional MR.   PASCAL mTEER was attempted in 7/24.  Unfortunately, the procedure was unsuccessful.    Today she returns for HF follow up. Overall feeling fine. She has SOB walking further distances on flat ground, this is her baseline. Denies palpitations, CP, dizziness, edema, abnormal bleeding, or PND/Orthopnea. Appetite ok. No fever or chills. Weight at home 122-123 pounds. Taking all medications. No nightmares on Inspra.  ECG (personally reviewed): none ordered today.  Medtronic device interrogation (personally reviewed): 95% BiV pacing, stable thoracic impedence, 0.9 hr/day activity  Labs (1/21): LDL 118 Labs (9/21): K 4.4, creatinine 1.93 Labs (12/21): K 4, creatinine 1.77 Labs (2/22): TSH normal Labs (5/22): K 5, creatinine 2.42 Labs (9/22): K 4.3,  creatinine 1.73, LDL 90 Labs (10/22): K 4.2, creatinine 2.18 Labs (2/23): K 3.8, creatinine 2.42 Labs (11/23): K 3.6, creatinine 2.22 Labs (2/24): K 3.9, creatinine 2.43 Labs (3/24): K 3.6, SCr 2.01 Labs (4/24): K 3.1, SCr 1.3 Labs (7/24): K 4.8, creatinine 2.18 Labs (8/24): K 4.5, creatinine 2.35  PMH: 1. Gout 2. Complete heart block: Medtronic CRT device.  3. CKD stage IV: Diabetic nephropathy.  4. Type 2 diabetes 5. Atrial fibrillation: Now permanent.   - AF ablation 4/18.  - AV nodal ablation in 2019 with MDT CRT-D.  6. Remote LV thrombus 7. Chronic systolic CHF: Nonischemic cardiomyopathy. Medtronic CRT-D device.  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  - LHC 2004: Nonobstructive mild CAD.  - Echo (8/19): EF 10-15%, mildly dilated LV, mild-moderately dilated RV with mild-moderately decreased systolic function.  - Echo (7/21): EF 20-25%, low normal RV function, moderate MR, mild AS.  - Echo (10/22): EF 25-30%, severe LV dilation, RV normal, moderate TR, mod-severe AS with mean gradient 9 mmHg but AVA 0.76 cm^2 (?low gradient severe AS), mild-moderate MR, left=>right shunt possible ASD.  - Echo (10/23): EF 25-30%, severe LV dilation, normal RV, mild MR, moderate AS with AVA 1.1 cm^2, mean gradient 9 mmHg (low flow low gradient moderate AS). ASD noted.  - Echo (2/24): EF 30-35% (on Dr. Alford Highland read), mildly dilated LV, normal RV, moderate MR, moderate AS with AVA  0.85 cm , mean gradient 13.5 mmHg (moderate low flow/low gradient aortic stenosis) - RHC/LHC (10/22): mild nonobstructive CAD, left to right atrial level shunt with Qp/Qs 1.43.  - RHC (2/24): RA mean 8, PA 52/21 (mean 31, PCPW mean 20 with prominent v-waves to 39, CO/CI (Fick) 4.92/3.09, PVR 2.2, Qp/Qs 1.29=>suggesting small ASD - TEE 4/24: LVEF 30-35%, LV w/ RWA, RV mildly reduced, "smoke" noted in LAA, RA mildly dilated, small secund ASD, 0.45 cm in diameter, TR mild-mod. Mild-mod  aortic valve stenosis. AV mean gradient with AVA 1.93 cm^2; moderate-severe MR.  - Echo (6/24): EF 30-35%, moderately decreased RV systolic function, moderate functional MR.  8. H/o cholecystectomy 9. Hypothyroidism 10. 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 Aortic stenosis: TEE (10/22) showed EF 30%, moderate LV dilation, normal RV, possible calcified vegetation on the ICD lead, small secundum ASD, peak RV-RA gradient 23 mmHg, low flow/low gradient moderate AS, mild-moderate MR.  - Echo in 10/23 confirmed moderate low flow/low gradient AS.  - Echo (2/24) probably moderate low flow/low gradient - Echo (4/24) AV mean gradient with AVA 1.93 cm^2 13. 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  14. 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  - RHC in 10/22 showed atrial level shunt with Qp/Qs 1.43.  - RHC 2/24 showed Qp/Qs 1.29 - TEE 4/24: small secundum ASD, 0.45 cm in diameter 15. PVCs: Zio monitor (10/22) with 12.6% PVCs. - Intolerant of both amiodarone and mexiletine.  16. Mitral regurgitation: Most studies have shown moderate-severe functional MR.  - She failed PASCAL mTEER in 7/24, will continue medical management.   SH: Married, lives in [place].  No smoking or ETOH.   Family History  Problem Relation Age of Onset   Hip fracture Mother    COPD Father    CAD Sister    Heart attack Sister    ROS: All systems reviewed and negative except as per HPI.   Current Outpatient Medications  Medication Sig Dispense Refill   acetaminophen (TYLENOL) 500 MG tablet Take 500 mg by mouth at bedtime as needed for mild pain.     allopurinol (ZYLOPRIM) 100 MG tablet take 2 tablets in the morning and 1 tablet in the evening. 100 tablet 11   Calcium Carb-Cholecalciferol (CALCIUM + VITAMIN D3 PO) Take 1 tablet by mouth daily.     empagliflozin (JARDIANCE) 10 MG TABS tablet Take 1 tablet (10 mg total) by mouth daily before  breakfast. 30 tablet 11   eplerenone (INSPRA) 25 MG tablet Take 1 tablet (25 mg total) by mouth daily. 90 tablet 3   furosemide (LASIX) 40 MG tablet Take 2 tablets (80 mg total) by mouth 2 (two) times daily. Take (80 mg total) every morning and 4 tablets (80 mg total) every evening     Glucosamine-Chondroitin (OSTEO BI-FLEX REGULAR STRENGTH PO) Take 1 tablet by mouth in the morning.     insulin glargine, 2 Unit Dial, (TOUJEO MAX SOLOSTAR) 300 UNIT/ML Solostar Pen Inject 24 Units into the skin at bedtime. 9 mL 2   insulin lispro (HUMALOG) 100 UNIT/ML injection Inject 4-8 Units into the skin daily as needed for high blood sugar. Sliding scale Less than 150 =0 150-200=4 Units 201-250=5 Units 251-300=6 Units 301-350=7 Units 351-400=8 Units     INSULIN SYRINGE .5CC/29G 29G X 1/2" 0.5 ML MISC Use to give yourself daily basal insulin (Lantus or Tuojeo). 100 each 6   levothyroxine (SYNTHROID) 88 MCG tablet Take 1 tablet (88 mcg total) by mouth daily before breakfast. 90 tablet 3   loratadine (CLARITIN) 10 MG tablet Take 10 mg by mouth in the morning.     metoprolol succinate (TOPROL-XL) 50 MG 24 hr tablet Take 1 tablet (50 mg total) by mouth daily. Take with or immediately following a meal. 90 tablet 3   nitroGLYCERIN (NITROSTAT) 0.4 MG SL tablet Place 1 tablet (0.4 mg total) under the tongue every 5 (five) minutes as needed for chest pain. 90 tablet 3   Omega-3 Fatty Acids (FISH OIL) 1200 MG CPDR Take 1,200 mg by mouth in the morning.     omeprazole (PRILOSEC) 40 MG capsule TAKE 1 CAPSULE BY MOUTH ONCE DAILY. 90 capsule 1   sacubitril-valsartan (ENTRESTO) 24-26 MG Take 1 tablet by mouth 2 (two) times daily. 60 tablet 11   simvastatin (ZOCOR) 20 MG tablet take 1 tablet every day at 6 in the evening 90 tablet 1   warfarin (COUMADIN) 2 MG tablet TAKE 1/2 TO 1 TABLET ONCE A DAY AS DIRECTED BY ANTICOAGULATION. (Patient taking differently: Take 2 mg by mouth daily with supper.) 40 tablet 5   No current  facility-administered medications for this encounter.   BP 124/86   Pulse 83   Wt 56.9 kg (125 lb 6.4 oz)   SpO2 97%   BMI 22.94 kg/m   Wt Readings from Last 3 Encounters:  11/18/22 56.9 kg (125 lb 6.4 oz)  10/27/22 56 kg (123 lb 6.4 oz)  10/11/22 56.3 kg (124 lb 3.2 oz)   PHYSICAL EXAM: General:  NAD. No resp difficulty, walked into clinic HEENT: Normal Neck: Supple. No JVD. Carotids 2+ bilat; no bruits. No lymphadenopathy or thryomegaly appreciated. Cor: PMI nondisplaced. Regular rate & rhythm.  No rubs, gallops or murmurs. Lungs: Clear Abdomen: Soft, nontender, nondistended. No hepatosplenomegaly. No bruits or masses. Good bowel sounds. Extremities: No cyanosis, clubbing, rash, edema Neuro: Alert & oriented x 3, cranial nerves grossly intact. Moves all 4 extremities w/o difficulty. Affect pleasant.  Assessment/Plan: 1. Chronic systolic CHF: Nonischemic cardiomyopathy.  Low EF for years.  Per patient's report, she had an endomyocardial biopsy at [facility] in 1988 showing myocarditis.  Medication titration has been limited by CKD stage IV. Has MDT CRT-D device, s/p AV nodal ablation with chronic AF. Echo in 7/21 showed EF 20-25%, moderate MR, mild AS.  Echo 12/17/20 showed EF 25-30%, severe LV dilation, RV normal, moderate TR, mod-severe AS with mean gradient 9 mmHg but AVA 0.76 cm^2.  TEE in 10/22 with EF 30%.  LHC in 10/22 with mild nonobstructive CAD.  Echo in 10/23 showed EF 25-30%, severe LV dilation, normal RV, mild MR, moderate AS with AVA 1.1 cm^2, mean gradient 9 mmHg (low flow low gradient moderate AS), small ASD. RHC (2/24) with mildly elevated RA pressure, moderate pulmonary venous hypertension, preserved CO and elevated PCWP with prominent v-waves. Echo 2/24 showed EF 30-35%, likely low flow/low gradient moderate aortic stenosis, moderate MR & ASD visualized.  Echo in 6/24 with EF 30-35%, moderate RV dysfunction, moderate functional MR.  Stable NYHA class II-III. She is not volume  overloaded by exam or CorVue - Continue Lasix 80 mg bid. Recent labs reviewed and are stable, K 4.5, SCr 2.35 - Continue eplerenone 25 mg daily (nightmares on spiro).  - Continue Jardiance 10 mg daily.  - Continue Entresto 24/26 bid. Would not increase with elevated creatinine.  - Continue Toprol XL 50 mg daily, cannot tolerate higher dose.  2.  Atrial fibrillation: Permanent.  S/p AV nodal ablation with BiV pacing.   - Continue warfarin.  INRs followed by [facility].  3. PVCs: Zio 10/22 with 12.6% PVCs. BiV pacing now improved at 95%.   - Continue Toprol. - Unable to tolerate amiodarone in the past.  - She had a rash with mexiletine and stopped it.  4. Aortic stenosis: Probably moderate low flow/low gradient aortic stenosis.  - Continue to follow.  5. ASD: Patient has a small ASD by echo with L=>R shunting, small. Qp/Qs on RHC 2/24 was 1.29.  Suspect not large enough to repair.  6. Mitral regurgitation: Moderate-severe functional MR.  Attempted PASCAL mTEER in 7/24 but procedure failed.  Will have to continue medical management.   7. Gout: Stable.  8. Calcified device lead vegetation: Blood cultures negative, PET-CT in 11/22 not suggestive of active endocarditis.   Follow-up in 4 months with Dr. Park Liter, FNP-BC 11/18/2022  [facility] [address] [facility] [place] [address] [phone] (office) [phone] (fax)

## 2022-11-29 ENCOUNTER — Other Ambulatory Visit (HOSPITAL_COMMUNITY): Payer: Self-pay | Admitting: Cardiology

## 2022-11-29 MED ORDER — FUROSEMIDE 40 MG PO TABS: 80.0000 mg | ORAL_TABLET | Freq: Two times a day (BID) | ORAL | 11 refills | Status: DC

## 2022-12-01 ENCOUNTER — Ambulatory Visit: Payer: PPO | Attending: Internal Medicine | Admitting: *Deleted

## 2022-12-01 DIAGNOSIS — I513 Intracardiac thrombosis, not elsewhere classified: Secondary | ICD-10-CM | POA: Diagnosis not present

## 2022-12-01 DIAGNOSIS — Z5181 Encounter for therapeutic drug level monitoring: Secondary | ICD-10-CM | POA: Diagnosis not present

## 2022-12-01 DIAGNOSIS — I4891 Unspecified atrial fibrillation: Secondary | ICD-10-CM

## 2022-12-01 LAB — POCT INR: INR: 3.3 — AB (ref 2.0–3.0)

## 2022-12-01 NOTE — Patient Instructions (Signed)
Hold warfarin tonight then decrease dose to 1 tablet daily except 2 tablets on Tuesdays, Thursdays and Saturdays Recheck in 4 weeks

## 2022-12-05 ENCOUNTER — Ambulatory Visit: Payer: PPO

## 2022-12-05 DIAGNOSIS — I420 Dilated cardiomyopathy: Secondary | ICD-10-CM | POA: Diagnosis not present

## 2022-12-06 ENCOUNTER — Ambulatory Visit: Payer: PPO | Attending: Internal Medicine

## 2022-12-06 DIAGNOSIS — Z95810 Presence of automatic (implantable) cardiac defibrillator: Secondary | ICD-10-CM | POA: Diagnosis not present

## 2022-12-06 DIAGNOSIS — I5022 Chronic systolic (congestive) heart failure: Secondary | ICD-10-CM | POA: Diagnosis not present

## 2022-12-06 LAB — CUP PACEART REMOTE DEVICE CHECK
Battery Remaining Longevity: 90 mo
Battery Voltage: 3 V
Brady Statistic AP VP Percent: 0 %
Brady Statistic AP VS Percent: 0 %
Brady Statistic AS VP Percent: 0 %
Brady Statistic AS VS Percent: 0 %
Brady Statistic RA Percent Paced: 0 %
Brady Statistic RV Percent Paced: 91.5 %
Date Time Interrogation Session: 20241007001603
HighPow Impedance: 67 Ohm
Implantable Lead Connection Status: 753985
Implantable Lead Connection Status: 753985
Implantable Lead Connection Status: 753985
Implantable Lead Implant Date: 20050218
Implantable Lead Implant Date: 20050218
Implantable Lead Implant Date: 20050218
Implantable Lead Location: 753858
Implantable Lead Location: 753859
Implantable Lead Location: 753860
Implantable Lead Model: 4194
Implantable Lead Model: 5076
Implantable Lead Model: 6947
Implantable Pulse Generator Implant Date: 20230313
Lead Channel Impedance Value: 342 Ohm
Lead Channel Impedance Value: 380 Ohm
Lead Channel Impedance Value: 399 Ohm
Lead Channel Impedance Value: 589 Ohm
Lead Channel Impedance Value: 703 Ohm
Lead Channel Impedance Value: 912 Ohm
Lead Channel Pacing Threshold Amplitude: 1.125 V
Lead Channel Pacing Threshold Amplitude: 1.375 V
Lead Channel Pacing Threshold Pulse Width: 0.4 ms
Lead Channel Pacing Threshold Pulse Width: 0.4 ms
Lead Channel Sensing Intrinsic Amplitude: 1 mV
Lead Channel Sensing Intrinsic Amplitude: 4.875 mV
Lead Channel Sensing Intrinsic Amplitude: 4.875 mV
Lead Channel Setting Pacing Amplitude: 2.25 V
Lead Channel Setting Pacing Amplitude: 2.5 V
Lead Channel Setting Pacing Pulse Width: 0.4 ms
Lead Channel Setting Pacing Pulse Width: 0.4 ms
Lead Channel Setting Sensing Sensitivity: 0.3 mV
Zone Setting Status: 755011

## 2022-12-07 NOTE — Progress Notes (Signed)
EPIC Encounter for ICM Monitoring  Patient Name: Emily [name] is a 72 y.o. female Date: 12/07/2022 Primary Care Physican: Glendale Chard, DO Primary Cardiologist: [facility] Electrophysiologist: Ladona Ridgel Nephrologist: [facility]-Dr Lacy Duverney  Bi-V Pacing: 91.5%  06/08/2022 Weight: 122-123 lbs 09/20/2022 Weight: 119-121 lbs 10/03/2022 Weight: 120 lbs 12/07/2022 Weight: 123-124 lbs    Spoke with patient and heart failure questions reviewed.  Transmission results reviewed.  Pt asymptomatic for fluid accumulation.  Reports feeling well at this time and voices no complaints.           Optivol Thoracic impedance trending slightly below baseline normal suggest possible minimal possible fluid within the last month.   Prescribed: Furosemide 80 mg Take 1 tablet (80 mg total) by mouth twice a day     Eplerenone 25 mg take 1 tablet by mouth daily     Labs: 10/21/2022 Creatinine 2.35, BUN 45, Potassium 4.5, Sodium 136, GFR 21  10/11/2022 Creatinine 2.36, BUN 35, Potassium 4.1, Sodium 138  09/07/2022 Creatinine 2.42, BUN 60, Potassium 4.5, Sodium 132, GFR 20 08/24/2022 Creatinine 2.37, BUN 45, Potassium 4.3, Sodium 133, GFR 21 07/15/2022 Potassium 2.7, Sodium 140  07/06/2022 Creatinine 2.08, BUN 32, Potassium 3.6, Sodium 134, GFR 25  06/23/2022 Creatinine 1.30, BUN 30, Potassium 3.1, Sodium 145  05/18/2022 Creatinine 2.01, BUN 43, Potassium 3.6, Sodium 134, GFR 26 04/19/2022 Creatinine 2.12, BUN 32, Potassium 4.0, Sodium 136, GFR 24 01/24/2022 Creatinine 2.31, BUN 31, Potassium 5.0, Sodium 136, GFR 22 A complete set of results can be found in Results Review.   Recommendations:  No changes and encouraged to call if experiencing any fluid symptoms.   Follow-up plan: ICM clinic phone appointment on 01/09/2023.  91 day device clinic remote transmission 12/05/2022.      EP/Cardiology Office Visits:  Recall 05/05/2023 with Dr Ladona Ridgel.   Recall 03/18/2023 with Dr Shirlee Latch.       Copy of  ICM check sent to Dr. Ladona Ridgel.  3 month ICM trend: 12/05/2022.    12-14 Month ICM trend:     Karie Soda, RN 12/07/2022 1:22 PM

## 2022-12-13 ENCOUNTER — Encounter: Payer: Self-pay | Admitting: Internal Medicine

## 2022-12-16 ENCOUNTER — Telehealth: Payer: Self-pay

## 2022-12-16 ENCOUNTER — Other Ambulatory Visit (HOSPITAL_COMMUNITY): Payer: Self-pay

## 2022-12-16 NOTE — Telephone Encounter (Signed)
Patient Advocate Encounter   The patient was approved for a [employer] grant that will help cover the cost of JARDIANCE AND ENTRESTO Total amount awarded, $10,000.  Effective: 12/26/22 - 12/25/23   BIN [id number] PCN [id number] [id number] ID [id number]   Pharmacy provided with approval and processing information. Patient informed via TELEPHONE ON A CALL CENTER CALL WITH ANNA, CPHT  Haze Rushing, CPhT  Pharmacy Patient Advocate Specialist  Direct Number: [phone] Fax: [phone]

## 2022-12-20 NOTE — Progress Notes (Signed)
Remote ICD transmission.   

## 2022-12-20 NOTE — Discharge Summary (Signed)
[facility]   MULTIDISCIPLINARY HEART VALVE TEAM  Discharge Summary    Patient ID: Emily [name] MRN: [id number]; DOB: 30-Dec-1950  Admit date: 08/31/2022 Discharge date: 12/20/2022  Primary Care Provider: Glendale Chard, DO  Primary Cardiologist: Marca Ancona, MD   Discharge Diagnoses    Principal Problem:   Severe mitral regurgitation Active Problems:   ICD (implantable cardioverter-defibrillator) in place   CKD (chronic kidney disease) stage 4, GFR 15-29 ml/min (HCC)   Type 2 diabetes mellitus with diabetic chronic kidney disease (HCC)   Hypertension, well controlled   Atrial fibrillation (HCC)   Coronary artery disease involving native coronary artery of native heart without angina pectoris   Warfarin anticoagulation   Hypercholesterolemia  Allergies Allergies  Allergen Reactions   Amiodarone Rash   Other Palpitations    PT STATES SHE GETS REALLY HOT , DIZZY HEADED, HAS PASSED OUT TWICE , AND ITCHING WITH IV DYE   Codeine Other (See Comments)    Euphoria, hallucinations   Turmeric     Pt went into AFIB    Colchicine Rash   Iodine Rash    topical   Ivp Dye [Iodinated Contrast Media] Rash   Mexitil [Mexiletine] Rash   Diagnostic Studies/Procedures    Attempted transcatheter mitral valve repair 08/31/22:  Aborted PASCAL mitral edge to edge repair due chordal interaction worsening mitral regurgitation (V-waves at baseline ~59mmHG with device grasps up to 50-40mmHg). The results were discussed with Dr. Shirlee Latch and medical therapy will be pursued.   History of Present Illness     Emily [name] is a 72 y.o. female with a history of NICM with an LVEF at 30-35% s/p CRT-D, permanent atrial fibrillation s/p AV node ablation and PPM placement on chronic coumadin, IDDM, stage IV CKD, HTN, HLD, secundum ASD, moderate AS and severe functional MR who presented to [facility] for scheduled mTEER.    Emily [name] has been followed with Dr. Shirlee Latch for her complex cardiology  care. She was initially diagnosed with myocarditis following an admission at [facility] with subsequent cardiac biopsy. Patient reported that her EF was down after that point. She had MDT CRT-device placed after that. Her EF has remained low on subsequent echo imaging. Echo in 7/21 showed EF 20-25%, moderate MR, mild AS. She is now in permanent atrial fibrillation and is s/p AV nodal ablation with BiV pacing. Elevated creatinine has limited medication management. TEE from 06/23/22 showed persistnetly decreased LV function at 30-35% with septal akinesis and  global hypokinesis of the other walls, small secundum ASD, mild to moderate AS, and moderate to severe MR. She was then referred to Dr. Lynnette Caffey for consideration of mTEER for her MR.   She was evaluated by the multidisciplinary valve team and felt to have severe, symptomatic mitral regurgitation and to be a suitable candidate for TEER, which was set up for 08/31/22.     Hospital Course     Severe functional MR: Unfortunately case was aborted due to chordal interaction worsening mitral regurgitation (V-waves at baseline ~46mmHG with device grasps up to 50-17mmHg). The results were discussed with Dr. Shirlee Latch and medical therapy will be pursued.   NICM: w/ persistent LVEF at 30-35% s/p CRT-D followed by AHF. Continue GDMT per AHF team.    Permanent AF s/p AV node ablation: Patient resumed on Coumadin given no clip placement and aborted case as above.    Stage IV CKD: Baseline appears to be in the 2.1-2.2 range.   DM2: Resume home regimen.  Moderate AS: Noted on TEE, echocardiogram with a mean gradient at and AVA by VTI at 1.93. Continue to follow closely.   Consultants: None    The patient was seen and examined by Dr. Lynnette Caffey who feels she is stable and ready for discharge.  _____________  Discharge Vitals Blood pressure 122/68, pulse 80, temperature (!) 97.1 F (36.2 C), resp. rate 16, height 5\' 3"  (1.6 m), weight 56.1 kg, SpO2 100%.   Filed Weights   08/31/22 0636  Weight: 56.1 kg   Disposition   Pt is being discharged home today in good condition.  Follow-up Plans & Appointments     Follow-up Information     Filbert Schilder, NP Follow up on 09/07/2022.   Specialty: Cardiology Why: @ 9:05am. Please arrive to your appointment at 8:50am. Contact information: [address] [place] [address] [phone]                 Discharge Medications   Allergies as of 08/31/2022       Reactions   Amiodarone Rash   Other Palpitations   PT STATES SHE GETS REALLY HOT , DIZZY HEADED, HAS PASSED OUT TWICE , AND ITCHING WITH IV DYE   Codeine Other (See Comments)   Euphoria, hallucinations   Turmeric    Pt went into AFIB    Colchicine Rash   Iodine Rash   topical   Ivp Dye [iodinated Contrast Media] Rash   Mexitil [mexiletine] Rash        Medication List     ASK your doctor about these medications    acetaminophen 500 MG tablet Commonly known as: TYLENOL Take 500 mg by mouth at bedtime as needed for mild pain.   allopurinol 100 MG tablet Commonly known as: ZYLOPRIM take 2 tablets in the morning and 1 tablet in the evening.   CALCIUM + VITAMIN D3 PO Take 1 tablet by mouth daily.   Fish Oil 1200 MG Cpdr Take 1,200 mg by mouth in the morning.   insulin lispro 100 UNIT/ML injection Commonly known as: HUMALOG Inject 4-8 Units into the skin daily as needed for high blood sugar. Sliding scale Less than 150 =0 150-200=4 Units 201-250=5 Units 251-300=6 Units 301-350=7 Units 351-400=8 Units   INSULIN SYRINGE .5CC/29G 29G X 1/2" 0.5 ML Misc Use to give yourself daily basal insulin (Lantus or Tuojeo).   Jardiance 10 MG Tabs tablet Generic drug: empagliflozin Take 1 tablet (10 mg total) by mouth daily before breakfast.   levothyroxine 88 MCG tablet Commonly known as: SYNTHROID Take 1 tablet (88 mcg total) by mouth daily before breakfast.   loratadine 10 MG tablet Commonly  known as: CLARITIN Take 10 mg by mouth in the morning.   nitroGLYCERIN 0.4 MG SL tablet Commonly known as: NITROSTAT Place 1 tablet (0.4 mg total) under the tongue every 5 (five) minutes as needed for chest pain.   omeprazole 40 MG capsule Commonly known as: PRILOSEC TAKE 1 CAPSULE BY MOUTH ONCE DAILY.   OSTEO BI-FLEX REGULAR STRENGTH PO Take 1 tablet by mouth in the morning.   Toujeo Max SoloStar 300 UNIT/ML Solostar Pen Generic drug: insulin glargine (2 Unit Dial) Inject 24 Units into the skin at bedtime.   warfarin 2 MG tablet Commonly known as: COUMADIN Take as directed. If you are unsure how to take this medication, talk to your nurse or doctor. Original instructions: TAKE 1/2 TO 1 TABLET ONCE A DAY AS DIRECTED BY ANTICOAGULATION.  Outstanding Labs/Studies   BMET, follow regular INR checks   Duration of Discharge Encounter   Greater than 30 minutes including physician time.  SignedGeorgie Chard, NP 12/20/2022, 10:23 AM [phone]

## 2022-12-29 ENCOUNTER — Ambulatory Visit: Payer: PPO | Attending: Cardiology | Admitting: *Deleted

## 2022-12-29 DIAGNOSIS — I4891 Unspecified atrial fibrillation: Secondary | ICD-10-CM

## 2022-12-29 DIAGNOSIS — Z5181 Encounter for therapeutic drug level monitoring: Secondary | ICD-10-CM | POA: Diagnosis not present

## 2022-12-29 DIAGNOSIS — I513 Intracardiac thrombosis, not elsewhere classified: Secondary | ICD-10-CM

## 2022-12-29 LAB — POCT INR: INR: 2.9 (ref 2.0–3.0)

## 2022-12-29 NOTE — Patient Instructions (Signed)
Continue warfarin 1 tablet daily except 2 tablets on Tuesdays, Thursdays and Saturdays Recheck in 4 weeks

## 2023-01-04 ENCOUNTER — Telehealth: Payer: Self-pay

## 2023-01-04 DIAGNOSIS — I5022 Chronic systolic (congestive) heart failure: Secondary | ICD-10-CM

## 2023-01-04 NOTE — Telephone Encounter (Signed)
Increase Lasix to 120 mg bid x 2 days then 120 qam/80 qpm.  BMET in 1 week and will need clinic followup.

## 2023-01-04 NOTE — Telephone Encounter (Signed)
Received remote transmission 11/6.  Optivol thoracic impedance suggesting possible fluid accumulation starting 10/1.

## 2023-01-04 NOTE — Telephone Encounter (Signed)
Spoke with patient and she attempted to send carelink report x 3 and unsuccessful.   Pt will attempt to contact [employer] tech support tomorrow or Friday.   Advised will send note to HF clinic regarding current symptoms for the past few days of  weight gain of 3 lbs (123 to 127 lbs) Wheezing feels like her heart is racing shortness of breath, especially when she is trying to sleep extreme fatigue.    Confirmed she is taking Furosemide 40 mg 2 tablets (80 mg total) twice a day.  Sent to Dr Shirlee Latch for review and recommendation regarding symptoms.

## 2023-01-04 NOTE — Telephone Encounter (Signed)
Received call from patient.  She reports the following symptoms over the last few days  weight gain of 3 lbs (123 to 127 lbs) Wheezing feels like her heart is racing shortness of breath, especially when she is trying to sleep extreme fatigue.     She thinks she may have fluid retention.  She will send [facility] remote transmission for review.

## 2023-01-05 MED ORDER — FUROSEMIDE 40 MG PO TABS: ORAL_TABLET | ORAL | 2 refills | Status: DC

## 2023-01-05 NOTE — Telephone Encounter (Signed)
Spoke with patient.  Advised Dr Alford Highland recommendations to increase Furosemide 40 mg to 3 tablets (120 mg total) twice a day x 2 days only.  Advised after the 2 days she should start a dosage of 3 tablets (120 mg total) every morning and 2 tablets (80 mg total) every evening and continue this dosage unless told otherwise.   Advised will need lab work in a week and she will go to [facility] for labs in a week, 11/14.    Advised she will receive a call from HF clinic to set up office appointment.   She verbalize understanding and repeated instructions back correctly.  She needs a refill for Lasix and sent to preferred pharmacy.

## 2023-01-06 NOTE — Telephone Encounter (Signed)
Appt sch for 11/22

## 2023-01-09 ENCOUNTER — Other Ambulatory Visit: Payer: Self-pay | Admitting: Cardiology

## 2023-01-09 ENCOUNTER — Ambulatory Visit: Payer: PPO | Attending: Internal Medicine

## 2023-01-09 DIAGNOSIS — I5022 Chronic systolic (congestive) heart failure: Secondary | ICD-10-CM

## 2023-01-09 DIAGNOSIS — I4821 Permanent atrial fibrillation: Secondary | ICD-10-CM

## 2023-01-09 DIAGNOSIS — Z95810 Presence of automatic (implantable) cardiac defibrillator: Secondary | ICD-10-CM

## 2023-01-09 DIAGNOSIS — Z5181 Encounter for therapeutic drug level monitoring: Secondary | ICD-10-CM

## 2023-01-09 NOTE — Progress Notes (Unsigned)
EPIC Encounter for ICM Monitoring  Patient Name: Emily [name] is a 72 y.o. female Date: 01/09/2023 Primary Care Physican: Glendale Chard, DO Primary Cardiologist: [facility] Electrophysiologist: Ladona Ridgel Nephrologist:  [facility]-Dr Lacy Duverney  Bi-V Pacing: 91.5%  06/08/2022 Weight: 122-123 lbs 09/20/2022 Weight: 119-121 lbs 10/03/2022 Weight: 120 lbs 12/07/2022 Weight: 123-124 lbs  01/04/2023 Weight: 127 lbs 01/09/2023 Weight: 127 lbs  VT-NS (>4 beats, >150 bpm) 2 Per 10/11/2022 HF clinic note, Atrial fibrillation: Now permanent.    Spoke with patient.  She reports more urine output when taking higher dosage of Lasix.  Continues to report following symptoms:        still 3 lbs above baseline (after taking higher Lasix dosage starting 11/7        dry cough        Wheezing continues but slight improvement        SOB and Racing heart beat on minimal exertion such as normal housework        Fatigue.....did not have enough energy yesterday to get a shower, dressed to go to church.           Optivol Thoracic impedance continues to suggest possible fluid accumulation starting 10/1 and no improvement after taking Lasix 120 mg bid x 2 days followed by long term dosage change to Lasix 120 mg every AM and 80 mg every PM (see 11/6 ICM phone note).   Prescribed: Furosemide 40 mg Take 3 tablets (120 mg total) by mouth every morning and 2 tablets (80 mg total) every evening     Eplerenone 25 mg take 1 tablet by mouth daily     Labs: 01/12/2023 BMET Scheduled and will be drawn at [facility] at patient's request 10/21/2022 Creatinine 2.35, BUN 45, Potassium 4.5, Sodium 136, GFR 21  10/11/2022 Creatinine 2.36, BUN 35, Potassium 4.1, Sodium 138  09/07/2022 Creatinine 2.42, BUN 60, Potassium 4.5, Sodium 132, GFR 20 08/24/2022 Creatinine 2.37, BUN 45, Potassium 4.3, Sodium 133, GFR 21 07/15/2022 Potassium 2.7, Sodium 140  07/06/2022 Creatinine 2.08, BUN 32, Potassium 3.6,  Sodium 134, GFR 25  06/23/2022 Creatinine 1.30, BUN 30, Potassium 3.1, Sodium 145  05/18/2022 Creatinine 2.01, BUN 43, Potassium 3.6, Sodium 134, GFR 26 04/19/2022 Creatinine 2.12, BUN 32, Potassium 4.0, Sodium 136, GFR 24 01/24/2022 Creatinine 2.31, BUN 31, Potassium 5.0, Sodium 136, GFR 22 A complete set of results can be found in Results Review.   Recommendations:   Copy sent to Dr Shirlee Latch for review following Lasix dosage increase on 11/6   Follow-up plan: ICM clinic phone appointment on 01/16/2023 (manual) to recheck fluid levels.  91 day device clinic remote transmission 03/06/2023.      EP/Cardiology Office Visits:  01/20/2023 with HF clinic.  Recall 05/05/2023 with Dr Ladona Ridgel.   Recall 03/18/2023 with Dr Shirlee Latch.       Copy of ICM check sent to Dr. Ladona Ridgel.  3 month ICM trend: 01/09/2023.    12-14 Month ICM trend:     Karie Soda, RN 01/09/2023 1:12 PM

## 2023-01-09 NOTE — Telephone Encounter (Signed)
Warfarin 2mg  refill Afib Last INR 12/29/22 Last OV 11/18/22

## 2023-01-09 NOTE — Progress Notes (Signed)
Start metolazone 2.5 once weekly with KCl 20 on metolazone days.  Take the weekly metolazone with the am Lasix.  BMET 2 wks.  Arrange followup in CHF clinic with APP.

## 2023-01-10 ENCOUNTER — Telehealth (HOSPITAL_COMMUNITY): Payer: Self-pay | Admitting: Cardiology

## 2023-01-10 MED ORDER — METOLAZONE 2.5 MG PO TABS: ORAL_TABLET | ORAL | 3 refills | Status: DC

## 2023-01-10 MED ORDER — POTASSIUM CHLORIDE CRYS ER 20 MEQ PO TBCR: EXTENDED_RELEASE_TABLET | ORAL | 3 refills | Status: DC

## 2023-01-10 NOTE — Telephone Encounter (Signed)
Pt left vm on triage line requesting an appt [phone] no answer/invalid number x [phone] call unable to complete  Follow up already scheduled 11/22

## 2023-01-10 NOTE — Progress Notes (Signed)
Spoke with patient and advised Dr Shirlee Latch ordered new medication called Metolazone.  Explained it is a strong diuretic that is taken weekly prior to taking Furosemide dosage.  Advised will also take Potassium on the days she takes Metolazone.  Weight increased to 128 lbs today, 11/12.  She will still need the 11/14 BMET and then again in 2 weeks on 11/27 or 11/29 since 11/28 is a holiday.  Reviewed instructions:  Take Metolazone 2.5 mg 1 tablet weekly starting tomorrow prior to taking the Furosemide.   Take Potassium 20 mEq 1 tablet on the days she takes weekly Metolazone.   BMET on 11/27 or 11/29 (will have drawn at [facility])  Advised to call if she has any questions or problems taking Metolazone such as dizziness, lightheadedness.   Advised to drink 64 oz daily especially when taking Metolazone.

## 2023-01-10 NOTE — Telephone Encounter (Signed)
Pt ok with upcoming appt Nothing further needed at this time

## 2023-01-13 ENCOUNTER — Telehealth (HOSPITAL_COMMUNITY): Payer: Self-pay

## 2023-01-13 DIAGNOSIS — I5022 Chronic systolic (congestive) heart failure: Secondary | ICD-10-CM

## 2023-01-13 LAB — BASIC METABOLIC PANEL
BUN/Creatinine Ratio: 17 (ref 12–28)
BUN: 52 mg/dL — ABNORMAL HIGH (ref 8–27)
CO2: 19 mmol/L — ABNORMAL LOW (ref 20–29)
Calcium: 8.3 mg/dL — ABNORMAL LOW (ref 8.7–10.3)
Chloride: 87 mmol/L — ABNORMAL LOW (ref 96–106)
Creatinine, Ser: 2.98 mg/dL — ABNORMAL HIGH (ref 0.57–1.00)
Glucose: 293 mg/dL — ABNORMAL HIGH (ref 70–99)
Potassium: 4.6 mmol/L (ref 3.5–5.2)
Sodium: 130 mmol/L — ABNORMAL LOW (ref 134–144)
eGFR: 16 mL/min/{1.73_m2} — ABNORMAL LOW (ref >59)

## 2023-01-13 MED ORDER — FUROSEMIDE 40 MG PO TABS: ORAL_TABLET | ORAL | 2 refills | Status: DC

## 2023-01-13 NOTE — Telephone Encounter (Addendum)
Pt aware, agreeable, and verbalized understanding   ----- Message from Marca Ancona sent at 01/13/2023  2:13 PM EST ----- Creatinine is up.  Hold Lasix x 1 day and then decrease to 80 mg bid.  BMET 1 week.

## 2023-01-16 ENCOUNTER — Telehealth: Payer: Self-pay

## 2023-01-16 ENCOUNTER — Other Ambulatory Visit (HOSPITAL_COMMUNITY): Payer: Self-pay

## 2023-01-16 ENCOUNTER — Ambulatory Visit: Payer: PPO | Attending: Internal Medicine

## 2023-01-16 DIAGNOSIS — Z95810 Presence of automatic (implantable) cardiac defibrillator: Secondary | ICD-10-CM

## 2023-01-16 DIAGNOSIS — I5022 Chronic systolic (congestive) heart failure: Secondary | ICD-10-CM

## 2023-01-16 MED ORDER — FUROSEMIDE 40 MG PO TABS: 80.0000 mg | ORAL_TABLET | Freq: Two times a day (BID) | ORAL | 2 refills | Status: DC

## 2023-01-16 NOTE — Telephone Encounter (Signed)
Remote ICM transmission received.  Attempted call to patient regarding ICM remote transmission and and no answer or voice mail option.

## 2023-01-16 NOTE — Progress Notes (Unsigned)
EPIC Encounter for ICM Monitoring  Patient Name: Emily [name] is a 72 y.o. female Date: 01/16/2023 Primary Care Physican: Glendale Chard, DO Primary Cardiologist: [facility] Electrophysiologist: Ladona Ridgel Nephrologist: [place] [facility]-Dr Lacy Duverney  Bi-V Pacing: 91.4%  06/08/2022 Weight: 122-123 lbs 09/20/2022 Weight: 119-121 lbs 10/03/2022 Weight: 120 lbs 12/07/2022 Weight: 123-124 lbs    Attempted call to patient and unable to reach.   Transmission reviewed.          Optivol Thoracic impedance returned to normal after taking prescribed Furosemide and Metolazone dosage.   Prescribed: Furosemide 80 mg Take 1 tablet (80 mg total) by mouth twice a day     Metolazone 2.5 mg take 1 tablet by mouth weekly.  Take 20 mEq potassium with weekly dose.  Potassium 20 mEq take 1 tablet by mouth with weekly Metolazone dosage Eplerenone 25 mg take 1 tablet by mouth daily     Labs: 01/12/2023 Creatinine 2.98, BUN 52, Potassium 4.6, Sodium 130, GFR 16 10/21/2022 Creatinine 2.35, BUN 45, Potassium 4.5, Sodium 136, GFR 21  10/11/2022 Creatinine 2.36, BUN 35, Potassium 4.1, Sodium 138  09/07/2022 Creatinine 2.42, BUN 60, Potassium 4.5, Sodium 132, GFR 20 08/24/2022 Creatinine 2.37, BUN 45, Potassium 4.3, Sodium 133, GFR 21 A complete set of results can be found in Results Review.   Recommendations:  Unable to reach.     Follow-up plan: ICM clinic phone appointment on 02/27/2023.  91 day device clinic remote transmission 03/06/2023.      EP/Cardiology Office Visits:   01/20/2023 with HF clinic.   Recall 05/05/2023 with Dr Ladona Ridgel.   Recall 03/18/2023 with Dr Shirlee Latch.       Copy of ICM check sent to Dr. Ladona Ridgel.  3 month ICM trend: 01/16/2023.    12-14 Month ICM trend:     Karie Soda, RN 01/16/2023 1:58 PM

## 2023-01-16 NOTE — Telephone Encounter (Signed)
Remote ICM transmission received.  Attempted call to patient regarding ICM remote transmission and no answer or voice mail option.  

## 2023-01-19 NOTE — Progress Notes (Signed)
PCP: Dr. Larita Fife ([facility]) Cardiology: Dr. Diona Browner HF Cardiology: Dr. Shirlee Latch EP: Dr. Ladona Ridgel   72 y.o. with history of CKD stage IV, permanent atrial fibrillation, and a long-standing nonischemic cardiomyopathy was referred by Dr. Diona Browner for CHF clinic evaluation.  She first developed cardiac problems in 1988.  At that time, she was admitted with CHF and eventually underwent myocardial biopsy at [facility], per her report showing myocarditis.  Patient states that her EF has been low since then.  She had MDT CRT-D device implanted.  Echo in 7/21 showed EF 20-25%, moderate MR, mild AS. She is now in permanent atrial fibrillation and is s/p AV nodal ablation with BiV pacing. Elevated creatinine has limited medication management.    Echo in 10/22 showed EF 25-30%, severe LV dilation, RV normal, moderate TR, mod-severe AS with mean gradient 9 mmHg but AVA 0.76 cm^2 (?low gradient severe AS), mild-moderate MR, left=>right shunt possible ASD.  TEE done in 10/22 to more closely assess AS.  This showed EF 30%, moderate LV dilation, normal RV, possible calcified vegetation on the ICD lead, small secundum ASD, peak RV-RA gradient 23 mmHg, low flow/low gradient moderate AS, mild-moderate MR. RHC/LHC was done, showing mild nonobstructive CAD, left to right atrial level shunt with Qp/Qs 1.43.  I reviewed TEE and cath report with Dr. Excell Seltzer.  We decided that AS was not severe enough yet for TAVR and that the ASD was not significant enough to close percutaneously. With possible calcified vegetation on ICD, we drew blood cultures which were negative.  She saw ID and had a PET-CT, which did not suggest active endocarditis.   Patient was found on Zio patch in 10/22 to have 12.6% PVCs, this was thought to be limiting her BiV pacing.  She has a history of intolerance of amiodarone so was started on mexiletine.  She says that this caused a rash so she stopped it and rash resolved. She also decreased Toprol XL to 100 mg daily  due to nightmares when taking 150 mg daily.  She subsequently has decreased Toprol XL to 50 mg daily.   Echo in 10/23 showed EF 25-30%, severe LV dilation, normal RV, mild MR, moderate AS with AVA 1.1 cm^2, mean gradient 9 mmHg (low flow low gradient moderate AS).   Follow up 2/24, volume stable but worse NYHA III symptoms with recent syncope concerning for orthostasis.  Zio 7-day (2/24): 7 runs of NSVT, continuous atrial fibrillation  RHC (2/24) showed elevated PCWP with prominent V-waves, mildly elevated RA pressure, preserved CO, moderate pulmonary venous hypertension, small ASD (Qp/Qs 1.29). Echo showed EF 30-35%, likely low flow/low gradient mild-moderate aortic stenosis.   Follow up with Dr. Ladona Ridgel 05/10/22, with increased PVC burden, her pacing rate was increased from 60-80 and PVCs improved.   TEE 4/24 with LVEF 30-35%, LV w/ RWA, RV mildly reduced, "smoke" noted in LAA, RA mildly dilated, small secundum ASD 0.45 cm in diameter, TR mild-mod. Mild-mod aortic valve stenosis; AV mean gradient with AVA 1.93 cm^2; moderate-severe functional MR.   Echo in 6/24 with EF 30-35%, moderately decreased RV systolic function, moderate functional MR.   PASCAL mTEER was attempted in 7/24.  Unfortunately, the procedure was unsuccessful.    Weekly metolazone 2.5 mg + 20 KCL added to diuretic regimen 01/09/23 due to on-going symptoms and OptiVol elevation.  Today she returns for HF follow up. Overall feeling "worn out." She is a little more SOB than usual and is dizzy. She had brisk urination with metolazone.  Feels palpitations are worse. Denies palpitations, CP, edema, or PND/Orthopnea. Appetite ok. No fever or chills. Weight at home 124-125 pounds. Taking all medications.  ECG (personally reviewed): BiV paced 83 bpm  Medtronic device interrogation (personally reviewed): OptiVol down and thoracic impedence up, suggesting hypovolemia, 90.9% BiV pacing, no VT, 0.6 hr/day activity  Labs (1/21): LDL  118 Labs (9/21): K 4.4, creatinine 1.93 Labs (12/21): K 4, creatinine 1.77 Labs (2/22): TSH normal Labs (5/22): K 5, creatinine 2.42 Labs (9/22): K 4.3, creatinine 1.73, LDL 90 Labs (10/22): K 4.2, creatinine 2.18 Labs (2/23): K 3.8, creatinine 2.42 Labs (11/23): K 3.6, creatinine 2.22 Labs (2/24): K 3.9, creatinine 2.43 Labs (3/24): K 3.6, SCr 2.01 Labs (4/24): K 3.1, SCr 1.3 Labs (7/24): K 4.8, creatinine 2.18 Labs (8/24): K 4.5, creatinine 2.35 Labs (11/24): K 4.6, creatinine 2.98  PMH: 1. Gout 2. Complete heart block: Medtronic CRT device.  3. CKD stage IV: Diabetic nephropathy.  4. Type 2 diabetes 5. Atrial fibrillation: Now permanent.   - AF ablation 4/18.  - AV nodal ablation in 2019 with MDT CRT-D.  6. Remote LV thrombus 7. Chronic systolic CHF: Nonischemic cardiomyopathy. Medtronic CRT-D device.  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  - LHC 2004: Nonobstructive mild CAD.  - Echo (8/19): EF 10-15%, mildly dilated LV, mild-moderately dilated RV with mild-moderately decreased systolic function.  - Echo (7/21): EF 20-25%, low normal RV function, moderate MR, mild AS.  - Echo (10/22): EF 25-30%, severe LV dilation, RV normal, moderate TR, mod-severe AS with mean gradient 9 mmHg but AVA 0.76 cm^2 (?low gradient severe AS), mild-moderate MR, left=>right shunt possible ASD.  - Echo (10/23): EF 25-30%, severe LV dilation, normal RV, mild MR, moderate AS with AVA 1.1 cm^2, mean gradient 9 mmHg (low flow low gradient moderate AS). ASD noted.  - Echo (2/24): EF 30-35% (on Dr. Alford Highland read), mildly dilated LV, normal RV, moderate MR, moderate AS with AVA  0.85 cm , mean gradient 13.5 mmHg (moderate low flow/low gradient aortic stenosis) - RHC/LHC (10/22): mild nonobstructive CAD, left to right atrial level shunt with Qp/Qs 1.43.  - RHC (2/24): RA mean 8, PA 52/21 (mean 31, PCPW mean 20 with prominent v-waves to 39, CO/CI (Fick) 4.92/3.09, PVR 2.2,  Qp/Qs 1.29=>suggesting small ASD - TEE 4/24: LVEF 30-35%, LV w/ RWA, RV mildly reduced, "smoke" noted in LAA, RA mildly dilated, small secund ASD, 0.45 cm in diameter, TR mild-mod. Mild-mod aortic valve stenosis. AV mean gradient with AVA 1.93 cm^2; moderate-severe MR.  - Echo (6/24): EF 30-35%, moderately decreased RV systolic function, moderate functional MR.  8. H/o cholecystectomy 9. Hypothyroidism 10. 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 Aortic stenosis: TEE (10/22) showed EF 30%, moderate LV dilation, normal RV, possible calcified vegetation on the ICD lead, small secundum ASD, peak RV-RA gradient 23 mmHg, low flow/low gradient moderate AS, mild-moderate MR.  - Echo in 10/23 confirmed moderate low flow/low gradient AS.  - Echo (2/24) probably moderate low flow/low gradient - Echo (4/24) AV mean gradient with AVA 1.93 cm^2 13. 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  14. 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  - RHC in 10/22 showed atrial level shunt with Qp/Qs 1.43.  - RHC 2/24 showed Qp/Qs 1.29 - TEE 4/24: small secundum ASD, 0.45 cm in diameter 15. PVCs: Zio monitor (10/22) with 12.6% PVCs. - Intolerant of both amiodarone and mexiletine.  16. Mitral regurgitation: Most studies have shown moderate-severe functional MR.  - She failed PASCAL mTEER in 7/24, will continue medical management.   SH: Married, lives in [place].  No smoking or ETOH.   Family History  Problem Relation Age of Onset   Hip fracture Mother    COPD Father    CAD Sister    Heart attack Sister    ROS: All systems reviewed and negative except as per HPI.   Current Outpatient Medications  Medication Sig Dispense Refill   acetaminophen (TYLENOL) 500 MG tablet Take 500 mg by mouth at bedtime as needed for mild pain.     allopurinol (ZYLOPRIM) 100 MG tablet take 2 tablets in the morning and 1 tablet in the evening. 100  tablet 11   Calcium Carb-Cholecalciferol (CALCIUM + VITAMIN D3 PO) Take 1 tablet by mouth daily.     empagliflozin (JARDIANCE) 10 MG TABS tablet Take 1 tablet (10 mg total) by mouth daily before breakfast. 30 tablet 11   eplerenone (INSPRA) 25 MG tablet Take 1 tablet (25 mg total) by mouth daily. 90 tablet 3   furosemide (LASIX) 40 MG tablet Take 2 tablets (80 mg total) by mouth 2 (two) times daily. 120 tablet 2   Glucosamine-Chondroitin (OSTEO BI-FLEX REGULAR STRENGTH PO) Take 1 tablet by mouth in the morning.     insulin glargine, 2 Unit Dial, (TOUJEO MAX SOLOSTAR) 300 UNIT/ML Solostar Pen Inject 24 Units into the skin at bedtime. 9 mL 2   insulin lispro (HUMALOG) 100 UNIT/ML injection Inject 4-8 Units into the skin daily as needed for high blood sugar. Sliding scale Less than 150 =0 150-200=4 Units 201-250=5 Units 251-300=6 Units 301-350=7 Units 351-400=8 Units     INSULIN SYRINGE .5CC/29G 29G X 1/2" 0.5 ML MISC Use to give yourself daily basal insulin (Lantus or Tuojeo). 100 each 6   levothyroxine (SYNTHROID) 88 MCG tablet Take 1 tablet (88 mcg total) by mouth daily before breakfast. 90 tablet 3   loratadine (CLARITIN) 10 MG tablet Take 10 mg by mouth in the morning.     metolazone (ZAROXOLYN) 2.5 MG tablet Take 1 tablet (2.5 mg total) weekly prior to taking Furosemide morning dosage.   Take 1 Potassium 20 mEq tablet on Metolazone days. 12 tablet 3   metoprolol succinate (TOPROL-XL) 50 MG 24 hr tablet Take 1 tablet (50 mg total) by mouth daily. Take with or immediately following a meal. 90 tablet 3   nitroGLYCERIN (NITROSTAT) 0.4 MG SL tablet Place 1 tablet (0.4 mg total) under the tongue every 5 (five) minutes as needed for chest pain. 90 tablet 3   Omega-3 Fatty Acids (FISH OIL) 1200 MG CPDR Take 1,200 mg by mouth in the morning.     omeprazole (PRILOSEC) 40 MG capsule TAKE 1 CAPSULE BY MOUTH ONCE DAILY. 90 capsule 1   potassium chloride SA (KLOR-CON M20) 20 MEQ tablet Take 1 tablet (20  mEq total) with weekly Metolazone dose. 12 tablet 3   sacubitril-valsartan (ENTRESTO) 24-26 MG Take 1 tablet by mouth 2 (two) times daily. 60 tablet 11   simvastatin (ZOCOR) 20 MG tablet take 1 tablet every day at 6 in the evening 90 tablet 1   warfarin (COUMADIN) 2 MG tablet Take 1 tablet by mouth daily except 2 tablets on Tuesday, Thursday, & Saturday or as directed by Anticoagulation Clinic. 40 tablet 3   No current facility-administered medications for this encounter.   BP 90/60   Pulse 86   Wt 56.7 kg (125  lb)   BMI 22.86 kg/m   Wt Readings from Last 3 Encounters:  01/20/23 56.7 kg (125 lb)  11/18/22 56.9 kg (125 lb 6.4 oz)  10/27/22 56 kg (123 lb 6.4 oz)   PHYSICAL EXAM: General:  NAD. No resp difficulty, walked into clinic, fatigued-appearing HEENT: Normal Neck: Supple. No JVD. Carotids 2+ bilat; no bruits. No lymphadenopathy or thryomegaly appreciated. Cor: PMI nondisplaced. Regular rate & rhythm. No rubs, gallops or murmurs. Lungs: Clear Abdomen: Soft, nontender, nondistended. No hepatosplenomegaly. No bruits or masses. Good bowel sounds. Extremities: No cyanosis, clubbing, rash, edema Neuro: Alert & oriented x 3, cranial nerves grossly intact. Moves all 4 extremities w/o difficulty. Affect pleasant.  Assessment/Plan: 1. Chronic systolic CHF: Nonischemic cardiomyopathy.  Low EF for years.  Per patient's report, she had an endomyocardial biopsy at [facility] in 1988 showing myocarditis.  Medication titration has been limited by CKD stage IV. Has MDT CRT-D device, s/p AV nodal ablation with chronic AF. Echo in 7/21 showed EF 20-25%, moderate MR, mild AS.  Echo 12/17/20 showed EF 25-30%, severe LV dilation, RV normal, moderate TR, mod-severe AS with mean gradient 9 mmHg but AVA 0.76 cm^2.  TEE in 10/22 with EF 30%.  LHC in 10/22 with mild nonobstructive CAD.  Echo in 10/23 showed EF 25-30%, severe LV dilation, normal RV, mild MR, moderate AS with AVA 1.1 cm^2, mean gradient 9 mmHg (low  flow low gradient moderate AS), small ASD. RHC (2/24) with mildly elevated RA pressure, moderate pulmonary venous hypertension, preserved CO and elevated PCWP with prominent v-waves. Echo 2/24 showed EF 30-35%, likely low flow/low gradient moderate aortic stenosis, moderate MR & ASD visualized.  Echo in 6/24 with EF 30-35%, moderate RV dysfunction, moderate functional MR.  Worse NYHA class III, with prominent fatigue. Exam and OptiVol suggestive of hypovolemia. - Liberalize salt and fluid intake today. - Decrease metolazone 2.5 mg/20 KCL to every other week (Tuesdays) - Hold Lasix today. BMET and BNP today. - After today, restart Lasix 80 mg bid.  - With low BP, stop Entresto - Tomorrow, start losartan 12.5 mg daily. - Continue eplerenone 25 mg daily (nightmares on spiro).  - Continue Jardiance 10 mg daily.  - Continue Toprol XL 50 mg daily, cannot tolerate higher dose.  - I will ask device RN to send OptiVol in 7-10 days to follow. 2.  Atrial fibrillation: Permanent.  S/p AV nodal ablation with BiV pacing.   - Continue warfarin.  INRs followed by [facility].  3. PVCs: Zio 10/22 with 12.6% PVCs. BiV pacing improved to 95%, today down to 91% - Continue Toprol. - Unable to tolerate amiodarone in the past.  - She had a rash with mexiletine and stopped it.  4. Aortic stenosis: Probably moderate low flow/low gradient aortic stenosis.  - Continue to follow.  5. ASD: Patient has a small ASD by echo with L=>R shunting, small. Qp/Qs on RHC 2/24 was 1.29.  Suspect not large enough to repair.  6. Mitral regurgitation: Moderate-severe functional MR.  Attempted PASCAL mTEER in 7/24 but procedure failed.  Will have to continue medical management.   7. Gout: Stable.  8. Calcified device lead vegetation: Blood cultures negative, PET-CT in 11/22 not suggestive of active endocarditis.   Follow up in 3-4 months with Dr. Park Liter, FNP-BC 01/20/2023  Advanced Heart Failure  Clinic [facility] [address] [facility] [place] [address] [phone] (office) [phone] (fax)

## 2023-01-20 ENCOUNTER — Ambulatory Visit (HOSPITAL_COMMUNITY)
Admission: RE | Admit: 2023-01-20 | Discharge: 2023-01-20 | Disposition: A | Discharge status: Home / Self Care | Payer: PPO | Source: Ambulatory Visit | Attending: Family Medicine

## 2023-01-20 ENCOUNTER — Other Ambulatory Visit (HOSPITAL_COMMUNITY): Payer: Self-pay | Admitting: Family Medicine

## 2023-01-20 ENCOUNTER — Telehealth (HOSPITAL_COMMUNITY): Payer: Self-pay | Admitting: *Deleted

## 2023-01-20 ENCOUNTER — Encounter (HOSPITAL_COMMUNITY): Payer: Self-pay

## 2023-01-20 ENCOUNTER — Other Ambulatory Visit (HOSPITAL_COMMUNITY): Payer: Self-pay

## 2023-01-20 VITALS — BP 90/60 | HR 86 | Wt 125.0 lb

## 2023-01-20 DIAGNOSIS — N184 Chronic kidney disease, stage 4 (severe): Secondary | ICD-10-CM | POA: Insufficient documentation

## 2023-01-20 DIAGNOSIS — I5022 Chronic systolic (congestive) heart failure: Secondary | ICD-10-CM

## 2023-01-20 DIAGNOSIS — I340 Nonrheumatic mitral (valve) insufficiency: Secondary | ICD-10-CM

## 2023-01-20 DIAGNOSIS — R9431 Abnormal electrocardiogram [ECG] [EKG]: Secondary | ICD-10-CM | POA: Diagnosis not present

## 2023-01-20 DIAGNOSIS — Z7901 Long term (current) use of anticoagulants: Secondary | ICD-10-CM | POA: Insufficient documentation

## 2023-01-20 DIAGNOSIS — I493 Ventricular premature depolarization: Secondary | ICD-10-CM | POA: Diagnosis not present

## 2023-01-20 DIAGNOSIS — Q211 Atrial septal defect, unspecified: Secondary | ICD-10-CM | POA: Diagnosis not present

## 2023-01-20 DIAGNOSIS — I428 Other cardiomyopathies: Secondary | ICD-10-CM | POA: Insufficient documentation

## 2023-01-20 DIAGNOSIS — I350 Nonrheumatic aortic (valve) stenosis: Secondary | ICD-10-CM | POA: Diagnosis not present

## 2023-01-20 DIAGNOSIS — I080 Rheumatic disorders of both mitral and aortic valves: Secondary | ICD-10-CM | POA: Diagnosis not present

## 2023-01-20 DIAGNOSIS — M109 Gout, unspecified: Secondary | ICD-10-CM | POA: Insufficient documentation

## 2023-01-20 DIAGNOSIS — I330 Acute and subacute infective endocarditis: Secondary | ICD-10-CM

## 2023-01-20 DIAGNOSIS — E1122 Type 2 diabetes mellitus with diabetic chronic kidney disease: Secondary | ICD-10-CM | POA: Insufficient documentation

## 2023-01-20 DIAGNOSIS — T82190A Other mechanical complication of cardiac electrode, initial encounter: Secondary | ICD-10-CM | POA: Insufficient documentation

## 2023-01-20 DIAGNOSIS — I4821 Permanent atrial fibrillation: Secondary | ICD-10-CM | POA: Insufficient documentation

## 2023-01-20 LAB — CBC
HCT: 34 % — ABNORMAL LOW (ref 36.0–46.0)
Hemoglobin: 11.3 g/dL — ABNORMAL LOW (ref 12.0–15.0)
MCH: 28 pg (ref 26.0–34.0)
MCHC: 33.2 g/dL (ref 30.0–36.0)
MCV: 84.2 fL (ref 80.0–100.0)
Platelets: 317 10*3/uL (ref 150–400)
RBC: 4.04 MIL/uL (ref 3.87–5.11)
RDW: 15.2 % (ref 11.5–15.5)
WBC: 8.5 10*3/uL (ref 4.0–10.5)
nRBC: 0 % (ref 0.0–0.2)

## 2023-01-20 LAB — BASIC METABOLIC PANEL
Anion gap: 15 (ref 5–15)
BUN: 63 mg/dL — ABNORMAL HIGH (ref 8–23)
CO2: 25 mmol/L (ref 22–32)
Calcium: 8.3 mg/dL — ABNORMAL LOW (ref 8.9–10.3)
Chloride: 85 mmol/L — ABNORMAL LOW (ref 98–111)
Creatinine, Ser: 3.29 mg/dL — ABNORMAL HIGH (ref 0.44–1.00)
GFR, Estimated: 14 mL/min — ABNORMAL LOW (ref >60)
Glucose, Bld: 200 mg/dL — ABNORMAL HIGH (ref 70–99)
Potassium: 4.7 mmol/L (ref 3.5–5.1)
Sodium: 125 mmol/L — ABNORMAL LOW (ref 135–145)

## 2023-01-20 LAB — IRON AND TIBC
Iron: 29 ug/dL (ref 28–170)
Saturation Ratios: 5 % — ABNORMAL LOW (ref 10.4–31.8)
TIBC: 545 ug/dL — ABNORMAL HIGH (ref 250–450)
UIBC: 516 ug/dL

## 2023-01-20 LAB — FERRITIN: Ferritin: 16 ng/mL (ref 11–307)

## 2023-01-20 LAB — BRAIN NATRIURETIC PEPTIDE: B Natriuretic Peptide: 671.7 pg/mL — ABNORMAL HIGH (ref 0.0–100.0)

## 2023-01-20 MED ORDER — POTASSIUM CHLORIDE CRYS ER 20 MEQ PO TBCR: EXTENDED_RELEASE_TABLET | ORAL | 3 refills | Status: DC

## 2023-01-20 MED ORDER — LOSARTAN POTASSIUM 25 MG PO TABS: 12.5000 mg | ORAL_TABLET | Freq: Every day | ORAL | 11 refills | Status: DC

## 2023-01-20 MED ORDER — METOLAZONE 2.5 MG PO TABS: ORAL_TABLET | ORAL | 3 refills | Status: DC

## 2023-01-20 NOTE — Telephone Encounter (Signed)
Geroge Baseman Queets, RN 01/20/2023  4:18 PM EST Back to Top    Pt aware, agreeable, and verbalized understanding, she will hold meds, repeat labs sch for Tue 11/26. Pt states she does not want to pursue iron infusion at this time, she doesn't like needles/PIV and states her iron level has always been low   Jacklynn Ganong, [place] 01/20/2023 12:23 PM EST     Tsat and ferritin low. Please arrange iron transfusion if she is agreeable. This may help her symptoms of fatigue.   Anderson Malta [place], FNP 01/20/2023 11:48 AM EST     Labs consistent with AKI and hyponatremia (low sodium).   Hold Lasix, Inspra, Jardiance, metolazone, losartan and KCL x 3 days. Then may resume at previous doses.   Repeat  BMET on Monday or Tuesday of next week.

## 2023-01-20 NOTE — Patient Instructions (Addendum)
Labs done today. We will contact you only if your labs are abnormal.  STOP taking Entresto  START Losartan 12.5mg  (1/2 tablet) by mouth daily.   DECREASE Metolazone 2.5mg  (1 tablet) by mouth every other week.  DECREASE Potassium to (1 tablet) by mouth every other week with Metolazone.   No other medication changes were made. Please continue all current medications as prescribed.  HOLD ALL HEART FAILURE MEDICATIONS TODAY. RESTART AGAIN TOMORROW.    Make sure you eat some salt today!   Your physician recommends that you schedule a follow-up appointment in: 4 months with Dr. Shirlee Latch. Please contact our office in January 2025 to schedule a March 2025 appointment.   If you have any questions or concerns before your next appointment please send [place] a message through [place] or call our office at [phone].    TO LEAVE A MESSAGE FOR THE NURSE SELECT OPTION 2, PLEASE LEAVE A MESSAGE INCLUDING: YOUR NAME DATE OF BIRTH CALL BACK NUMBER REASON FOR CALL**this is important as we prioritize the call backs  YOU WILL RECEIVE A CALL BACK THE SAME DAY AS LONG AS YOU CALL BEFORE 4:00 PM   Do the following things EVERYDAY: Weigh yourself in the morning before breakfast. Write it down and keep it in a log. Take your medicines as prescribed Eat low salt foods--Limit salt (sodium) to 2000 mg per day.  Stay as active as you can everyday Limit all fluids for the day to less than 2 liters   At the [facility], you and your health needs are our priority. As part of our continuing mission to provide you with exceptional heart care, we have created designated Provider Care Teams. These Care Teams include your primary Cardiologist (physician) and Advanced Practice Providers (APPs- Physician Assistants and Nurse Practitioners) who all work together to provide you with the care you need, when you need it.   You may see any of the following providers on your designated Care Team at  your next follow up: Dr Arvilla Meres Dr Marca Ancona Dr. Marcos Eke, NP Robbie Lis, [place] [facility], [place] Brynda Peon, NP Karle Plumber, PharmD   Please be sure to bring in all your medications bottles to every appointment.    Thank you for choosing [facility]

## 2023-01-24 ENCOUNTER — Ambulatory Visit (HOSPITAL_COMMUNITY)
Admission: RE | Admit: 2023-01-24 | Discharge: 2023-01-24 | Disposition: A | Discharge status: Home / Self Care | Payer: PPO | Source: Ambulatory Visit | Attending: Internal Medicine | Admitting: Internal Medicine

## 2023-01-24 DIAGNOSIS — I5022 Chronic systolic (congestive) heart failure: Secondary | ICD-10-CM | POA: Diagnosis present

## 2023-01-24 LAB — BASIC METABOLIC PANEL
Anion gap: 16 — ABNORMAL HIGH (ref 5–15)
BUN: 71 mg/dL — ABNORMAL HIGH (ref 8–23)
CO2: 22 mmol/L (ref 22–32)
Calcium: 8.2 mg/dL — ABNORMAL LOW (ref 8.9–10.3)
Chloride: 91 mmol/L — ABNORMAL LOW (ref 98–111)
Creatinine, Ser: 2.88 mg/dL — ABNORMAL HIGH (ref 0.44–1.00)
GFR, Estimated: 17 mL/min — ABNORMAL LOW (ref >60)
Glucose, Bld: 179 mg/dL — ABNORMAL HIGH (ref 70–99)
Potassium: 4.4 mmol/L (ref 3.5–5.1)
Sodium: 129 mmol/L — ABNORMAL LOW (ref 135–145)

## 2023-01-30 ENCOUNTER — Ambulatory Visit: Payer: PPO | Attending: Internal Medicine | Admitting: *Deleted

## 2023-01-30 DIAGNOSIS — I513 Intracardiac thrombosis, not elsewhere classified: Secondary | ICD-10-CM

## 2023-01-30 DIAGNOSIS — I4891 Unspecified atrial fibrillation: Secondary | ICD-10-CM

## 2023-01-30 DIAGNOSIS — Z5181 Encounter for therapeutic drug level monitoring: Secondary | ICD-10-CM | POA: Diagnosis not present

## 2023-01-30 LAB — POCT INR: INR: 2.1 (ref 2.0–3.0)

## 2023-01-30 NOTE — Patient Instructions (Signed)
Continue warfarin 1 tablet daily except 2 tablets on Tuesdays, Thursdays and Saturdays Recheck in 6 weeks

## 2023-02-01 ENCOUNTER — Telehealth (HOSPITAL_COMMUNITY): Payer: Self-pay | Admitting: Cardiology

## 2023-02-01 NOTE — Telephone Encounter (Signed)
Patient called to report increase in palps over the past 2 weeks -reports with normal activity she can feel her heart "just a racing" -denies CP, dizziness, recent medication changes  11/22 OV HX of perm A fiB  PVCs: Zio 10/22 with 12.6% PVCs. BiV pacing improved to 95%, today down to 91% - Continue Toprol. - Unable to tolerate amiodarone in the past.  - She had a rash with mexiletine and stopped it.    EP follow up 3/22 Dr Junie Spencer to return for repeat zio, acute add on, direct pt to EP Please advise

## 2023-02-02 NOTE — Telephone Encounter (Signed)
Nurse visit for ZIO 12/6 @ 11 Pt aware to contact Dr Ladona Ridgel for follow up  Message to device clinic to assist with transmission

## 2023-02-02 NOTE — Telephone Encounter (Signed)
Per EP RN E,Wallace RN NO alerts on her device since 11/30 and those were NSVT <20 beats, we would not alert our EP MD for that. She is in AF but not new for her. Nothing is showing abnormal for her device.

## 2023-02-03 ENCOUNTER — Encounter (HOSPITAL_COMMUNITY): Payer: PPO

## 2023-02-07 ENCOUNTER — Ambulatory Visit (HOSPITAL_COMMUNITY)
Admission: RE | Admit: 2023-02-07 | Discharge: 2023-02-07 | Disposition: A | Discharge status: Home / Self Care | Payer: PPO | Source: Ambulatory Visit | Attending: Cardiology | Admitting: Cardiology

## 2023-02-07 ENCOUNTER — Inpatient Hospital Stay (HOSPITAL_COMMUNITY)
Admission: RE | Admit: 2023-02-07 | Discharge: 2023-02-07 | Disposition: A | Discharge status: Home / Self Care | Payer: PPO | Source: Ambulatory Visit | Attending: Cardiology | Admitting: Cardiology

## 2023-02-07 DIAGNOSIS — R002 Palpitations: Secondary | ICD-10-CM | POA: Insufficient documentation

## 2023-02-08 ENCOUNTER — Telehealth (HOSPITAL_COMMUNITY): Payer: Self-pay | Admitting: Surgery

## 2023-02-08 NOTE — Telephone Encounter (Signed)
I called Emily [name] after receiving an email from [place] that her monitor had come off after only wearing for 1 day.  She told the company that she did not want a replacement however after I spoke with her she is willing to have one.  I will contact the company and have them send her a replacement through the mail.

## 2023-02-13 ENCOUNTER — Inpatient Hospital Stay (HOSPITAL_COMMUNITY): Admission: RE | Admit: 2023-02-13 | Payer: PPO | Source: Ambulatory Visit

## 2023-02-14 ENCOUNTER — Encounter (HOSPITAL_COMMUNITY): Payer: Self-pay | Admitting: Cardiology

## 2023-02-14 ENCOUNTER — Other Ambulatory Visit (HOSPITAL_COMMUNITY): Payer: Self-pay

## 2023-02-14 ENCOUNTER — Ambulatory Visit (HOSPITAL_COMMUNITY)
Admission: RE | Admit: 2023-02-14 | Discharge: 2023-02-14 | Disposition: A | Discharge status: Home / Self Care | Payer: PPO | Source: Ambulatory Visit | Attending: Cardiology | Admitting: Cardiology

## 2023-02-14 VITALS — BP 122/80 | HR 83 | Wt 129.0 lb

## 2023-02-14 DIAGNOSIS — Z7901 Long term (current) use of anticoagulants: Secondary | ICD-10-CM | POA: Insufficient documentation

## 2023-02-14 DIAGNOSIS — I4821 Permanent atrial fibrillation: Secondary | ICD-10-CM | POA: Diagnosis not present

## 2023-02-14 DIAGNOSIS — I5022 Chronic systolic (congestive) heart failure: Secondary | ICD-10-CM | POA: Insufficient documentation

## 2023-02-14 DIAGNOSIS — I428 Other cardiomyopathies: Secondary | ICD-10-CM | POA: Diagnosis not present

## 2023-02-14 DIAGNOSIS — I2510 Atherosclerotic heart disease of native coronary artery without angina pectoris: Secondary | ICD-10-CM | POA: Diagnosis not present

## 2023-02-14 DIAGNOSIS — Q211 Atrial septal defect, unspecified: Secondary | ICD-10-CM | POA: Insufficient documentation

## 2023-02-14 DIAGNOSIS — M109 Gout, unspecified: Secondary | ICD-10-CM | POA: Insufficient documentation

## 2023-02-14 DIAGNOSIS — I493 Ventricular premature depolarization: Secondary | ICD-10-CM | POA: Insufficient documentation

## 2023-02-14 DIAGNOSIS — I080 Rheumatic disorders of both mitral and aortic valves: Secondary | ICD-10-CM | POA: Insufficient documentation

## 2023-02-14 DIAGNOSIS — E1122 Type 2 diabetes mellitus with diabetic chronic kidney disease: Secondary | ICD-10-CM | POA: Insufficient documentation

## 2023-02-14 DIAGNOSIS — Z79899 Other long term (current) drug therapy: Secondary | ICD-10-CM | POA: Insufficient documentation

## 2023-02-14 DIAGNOSIS — I272 Pulmonary hypertension, unspecified: Secondary | ICD-10-CM | POA: Insufficient documentation

## 2023-02-14 DIAGNOSIS — N184 Chronic kidney disease, stage 4 (severe): Secondary | ICD-10-CM | POA: Diagnosis not present

## 2023-02-14 DIAGNOSIS — Z8679 Personal history of other diseases of the circulatory system: Secondary | ICD-10-CM | POA: Diagnosis not present

## 2023-02-14 DIAGNOSIS — Z794 Long term (current) use of insulin: Secondary | ICD-10-CM | POA: Insufficient documentation

## 2023-02-14 DIAGNOSIS — Z7984 Long term (current) use of oral hypoglycemic drugs: Secondary | ICD-10-CM | POA: Diagnosis not present

## 2023-02-14 LAB — BASIC METABOLIC PANEL
Anion gap: 13 (ref 5–15)
BUN: 59 mg/dL — ABNORMAL HIGH (ref 8–23)
CO2: 18 mmol/L — ABNORMAL LOW (ref 22–32)
Calcium: 8.2 mg/dL — ABNORMAL LOW (ref 8.9–10.3)
Chloride: 100 mmol/L (ref 98–111)
Creatinine, Ser: 2.94 mg/dL — ABNORMAL HIGH (ref 0.44–1.00)
GFR, Estimated: 16 mL/min — ABNORMAL LOW (ref >60)
Glucose, Bld: 212 mg/dL — ABNORMAL HIGH (ref 70–99)
Potassium: 3.9 mmol/L (ref 3.5–5.1)
Sodium: 131 mmol/L — ABNORMAL LOW (ref 135–145)

## 2023-02-14 LAB — CBC
HCT: 30.1 % — ABNORMAL LOW (ref 36.0–46.0)
Hemoglobin: 9.5 g/dL — ABNORMAL LOW (ref 12.0–15.0)
MCH: 27.5 pg (ref 26.0–34.0)
MCHC: 31.6 g/dL (ref 30.0–36.0)
MCV: 87.2 fL (ref 80.0–100.0)
Platelets: 262 10*3/uL (ref 150–400)
RBC: 3.45 MIL/uL — ABNORMAL LOW (ref 3.87–5.11)
RDW: 16.6 % — ABNORMAL HIGH (ref 11.5–15.5)
WBC: 7.6 10*3/uL (ref 4.0–10.5)
nRBC: 0.3 % — ABNORMAL HIGH (ref 0.0–0.2)

## 2023-02-14 LAB — BRAIN NATRIURETIC PEPTIDE: B Natriuretic Peptide: 804.1 pg/mL — ABNORMAL HIGH (ref 0.0–100.0)

## 2023-02-14 LAB — TSH: TSH: 8.183 u[IU]/mL — ABNORMAL HIGH (ref 0.350–4.500)

## 2023-02-14 MED ORDER — TORSEMIDE 20 MG PO TABS: 20.0000 mg | ORAL_TABLET | Freq: Every day | ORAL | 3 refills | Status: DC

## 2023-02-14 MED ORDER — METOLAZONE 2.5 MG PO TABS: 2.5000 mg | ORAL_TABLET | ORAL | 3 refills | Status: DC

## 2023-02-14 MED ORDER — POTASSIUM CHLORIDE CRYS ER 20 MEQ PO TBCR: 20.0000 meq | EXTENDED_RELEASE_TABLET | ORAL | 3 refills | Status: DC

## 2023-02-14 MED ORDER — TORSEMIDE 20 MG PO TABS: 80.0000 mg | ORAL_TABLET | Freq: Every day | ORAL | 3 refills | Status: DC

## 2023-02-14 NOTE — Progress Notes (Signed)
PCP: Dr. Larita Fife ([facility]) Cardiology: Dr. Diona Browner HF Cardiology: Dr. Shirlee Latch EP: Dr. Ladona Ridgel   72 y.o. with history of CKD stage IV, permanent atrial fibrillation, and a long-standing nonischemic cardiomyopathy was referred by Dr. Diona Browner for CHF clinic evaluation.  She first developed cardiac problems in 1988.  At that time, she was admitted with CHF and eventually underwent myocardial biopsy at [facility], per her report showing myocarditis.  Patient states that her EF has been low since then.  She had MDT CRT-D device implanted.  Echo in 7/21 showed EF 20-25%, moderate MR, mild AS. She is now in permanent atrial fibrillation and is s/p AV nodal ablation with BiV pacing. Elevated creatinine has limited medication management.    Echo in 10/22 showed EF 25-30%, severe LV dilation, RV normal, moderate TR, mod-severe AS with mean gradient 9 mmHg but AVA 0.76 cm^2 (?low gradient severe AS), mild-moderate MR, left=>right shunt possible ASD.  TEE done in 10/22 to more closely assess AS.  This showed EF 30%, moderate LV dilation, normal RV, possible calcified vegetation on the ICD lead, small secundum ASD, peak RV-RA gradient 23 mmHg, low flow/low gradient moderate AS, mild-moderate MR. RHC/LHC was done, showing mild nonobstructive CAD, left to right atrial level shunt with Qp/Qs 1.43.  I reviewed TEE and cath report with Dr. Excell Seltzer.  We decided that AS was not severe enough yet for TAVR and that the ASD was not significant enough to close percutaneously. With possible calcified vegetation on ICD, we drew blood cultures which were negative.  She saw ID and had a PET-CT, which did not suggest active endocarditis.   Patient was found on Zio patch in 10/22 to have 12.6% PVCs, this was thought to be limiting her BiV pacing.  She has a history of intolerance of amiodarone so was started on mexiletine.  She says that this caused a rash so she stopped it and rash resolved. She also decreased Toprol XL to 100 mg daily  due to nightmares when taking 150 mg daily.  She subsequently has decreased Toprol XL to 50 mg daily.   Echo in 10/23 showed EF 25-30%, severe LV dilation, normal RV, mild MR, moderate AS with AVA 1.1 cm^2, mean gradient 9 mmHg (low flow low gradient moderate AS).   Follow up 2/24, volume stable but worse NYHA III symptoms with recent syncope concerning for orthostasis.  Zio 7-day (2/24): 7 runs of NSVT, continuous atrial fibrillation  RHC (2/24) showed elevated PCWP with prominent V-waves, mildly elevated RA pressure, preserved CO, moderate pulmonary venous hypertension, small ASD (Qp/Qs 1.29). Echo showed EF 30-35%, likely low flow/low gradient mild-moderate aortic stenosis.   Follow up with Dr. Ladona Ridgel 05/10/22, with increased PVC burden, her pacing rate was increased from 60-80 and PVCs improved.   TEE 4/24 with LVEF 30-35%, LV w/ RWA, RV mildly reduced, "smoke" noted in LAA, RA mildly dilated, small secundum ASD 0.45 cm in diameter, TR mild-mod. Mild-mod aortic valve stenosis; AV mean gradient with AVA 1.93 cm^2; moderate-severe functional MR.   Echo in 6/24 with EF 30-35%, moderately decreased RV systolic function, moderate functional MR.   PASCAL mTEER was attempted in 7/24.  Unfortunately, the procedure was unsuccessful.    Weekly metolazone 2.5 mg + 20 KCL added to diuretic regimen 01/09/23 due to ongoing symptoms and OptiVol elevation.  Today she returns for HF follow up. She has felt worse recently with increased fatigue and dyspnea walking around the house.  She came into the office in  a wheelchair.  She has had palpitations and is wearing a Zio monitor.  Weight up 4 lbs.  Today is her day to take metolazone. No lightheadedness.  No chest pain.   ECG (personally reviewed): Atrial fibrillation with BiV pacing. PVCs.   Medtronic device interrogation (personally reviewed): Thoracic impedance trending down though fluid index still < threshold.  No VT.   Labs (1/21): LDL 118 Labs  (9/21): K 4.4, creatinine 1.93 Labs (12/21): K 4, creatinine 1.77 Labs (2/22): TSH normal Labs (5/22): K 5, creatinine 2.42 Labs (9/22): K 4.3, creatinine 1.73, LDL 90 Labs (10/22): K 4.2, creatinine 2.18 Labs (2/23): K 3.8, creatinine 2.42 Labs (11/23): K 3.6, creatinine 2.22 Labs (2/24): K 3.9, creatinine 2.43 Labs (3/24): K 3.6, SCr 2.01 Labs (4/24): K 3.1, SCr 1.3 Labs (7/24): K 4.8, creatinine 2.18 Labs (8/24): K 4.5, creatinine 2.35 Labs (11/24): K 4.6, creatinine 2.98 => 2.88  PMH: 1. Gout 2. Complete heart block: Medtronic CRT device.  3. CKD stage IV: Diabetic nephropathy.  4. Type 2 diabetes 5. Atrial fibrillation: Now permanent.   - AF ablation 4/18.  - AV nodal ablation in 2019 with MDT CRT-D.  6. Remote LV thrombus 7. Chronic systolic CHF: Nonischemic cardiomyopathy. Medtronic CRT-D device.  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  - LHC 2004: Nonobstructive mild CAD.  - Echo (8/19): EF 10-15%, mildly dilated LV, mild-moderately dilated RV with mild-moderately decreased systolic function.  - Echo (7/21): EF 20-25%, low normal RV function, moderate MR, mild AS.  - Echo (10/22): EF 25-30%, severe LV dilation, RV normal, moderate TR, mod-severe AS with mean gradient 9 mmHg but AVA 0.76 cm^2 (?low gradient severe AS), mild-moderate MR, left=>right shunt possible ASD.  - Echo (10/23): EF 25-30%, severe LV dilation, normal RV, mild MR, moderate AS with AVA 1.1 cm^2, mean gradient 9 mmHg (low flow low gradient moderate AS). ASD noted.  - Echo (2/24): EF 30-35% (on Dr. Alford Highland read), mildly dilated LV, normal RV, moderate MR, moderate AS with AVA  0.85 cm , mean gradient 13.5 mmHg (moderate low flow/low gradient aortic stenosis) - RHC/LHC (10/22): mild nonobstructive CAD, left to right atrial level shunt with Qp/Qs 1.43.  - RHC (2/24): RA mean 8, PA 52/21 (mean 31, PCPW mean 20 with prominent v-waves to 39, CO/CI (Fick) 4.92/3.09, PVR 2.2,  Qp/Qs 1.29=>suggesting small ASD - TEE 4/24: LVEF 30-35%, LV w/ RWA, RV mildly reduced, "smoke" noted in LAA, RA mildly dilated, small secund ASD, 0.45 cm in diameter, TR mild-mod. Mild-mod aortic valve stenosis. AV mean gradient with AVA 1.93 cm^2; moderate-severe MR.  - Echo (6/24): EF 30-35%, moderately decreased RV systolic function, moderate functional MR.  8. H/o cholecystectomy 9. Hypothyroidism 10. 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 Aortic stenosis: TEE (10/22) showed EF 30%, moderate LV dilation, normal RV, possible calcified vegetation on the ICD lead, small secundum ASD, peak RV-RA gradient 23 mmHg, low flow/low gradient moderate AS, mild-moderate MR.  - Echo in 10/23 confirmed moderate low flow/low gradient AS.  - Echo (2/24) probably moderate low flow/low gradient - Echo (4/24) AV mean gradient with AVA 1.93 cm^2 13. 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  14. 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  - RHC in 10/22 showed atrial level shunt with Qp/Qs 1.43.  - RHC 2/24 showed Qp/Qs 1.29 - TEE 4/24: small secundum ASD, 0.45 cm in diameter 15. PVCs: Zio  monitor (10/22) with 12.6% PVCs. - Intolerant of both amiodarone and mexiletine.  16. Mitral regurgitation: Most studies have shown moderate-severe functional MR.  - She failed PASCAL mTEER in 7/24, will continue medical management.   SH: Married, lives in [place].  No smoking or ETOH.   Family History  Problem Relation Age of Onset   Hip fracture Mother    COPD Father    CAD Sister    Heart attack Sister    ROS: All systems reviewed and negative except as per HPI.   Current Outpatient Medications  Medication Sig Dispense Refill   acetaminophen (TYLENOL) 500 MG tablet Take 500 mg by mouth at bedtime as needed for mild pain.     allopurinol (ZYLOPRIM) 100 MG tablet take 2 tablets in the morning and 1 tablet in the evening. 100  tablet 11   Calcium Carb-Cholecalciferol (CALCIUM + VITAMIN D3 PO) Take 1 tablet by mouth daily.     empagliflozin (JARDIANCE) 10 MG TABS tablet Take 1 tablet (10 mg total) by mouth daily before breakfast. 30 tablet 11   eplerenone (INSPRA) 25 MG tablet Take 1 tablet (25 mg total) by mouth daily. 90 tablet 3   Glucosamine-Chondroitin (OSTEO BI-FLEX REGULAR STRENGTH PO) Take 1 tablet by mouth in the morning.     insulin glargine, 2 Unit Dial, (TOUJEO MAX SOLOSTAR) 300 UNIT/ML Solostar Pen Inject 24 Units into the skin at bedtime. 9 mL 2   insulin lispro (HUMALOG) 100 UNIT/ML injection Inject 4-8 Units into the skin daily as needed for high blood sugar. Sliding scale Less than 150 =0 150-200=4 Units 201-250=5 Units 251-300=6 Units 301-350=7 Units 351-400=8 Units     INSULIN SYRINGE .5CC/29G 29G X 1/2" 0.5 ML MISC Use to give yourself daily basal insulin (Lantus or Tuojeo). 100 each 6   levothyroxine (SYNTHROID) 88 MCG tablet Take 1 tablet (88 mcg total) by mouth daily before breakfast. 90 tablet 3   losartan (COZAAR) 25 MG tablet Take 0.5 tablets (12.5 mg total) by mouth daily. 15 tablet 11   metoprolol succinate (TOPROL-XL) 100 MG 24 hr tablet Take 100 mg by mouth daily. Take with or immediately following a meal.     nitroGLYCERIN (NITROSTAT) 0.4 MG SL tablet Place 1 tablet (0.4 mg total) under the tongue every 5 (five) minutes as needed for chest pain. 90 tablet 3   Omega-3 Fatty Acids (FISH OIL) 1200 MG CPDR Take 1,200 mg by mouth in the morning.     omeprazole (PRILOSEC) 40 MG capsule TAKE 1 CAPSULE BY MOUTH ONCE DAILY. 90 capsule 1   simvastatin (ZOCOR) 20 MG tablet take 1 tablet every day at 6 in the evening 90 tablet 1   warfarin (COUMADIN) 2 MG tablet Take 1 tablet by mouth daily except 2 tablets on Tuesday, Thursday, & Saturday or as directed by Anticoagulation Clinic. 40 tablet 3   metolazone (ZAROXOLYN) 2.5 MG tablet Take 1 tablet (2.5 mg total) by mouth once a week. Every Tuesday   Take 1 Potassium 20 mEq tablet on Metolazone days. 15 tablet 3   potassium chloride SA (KLOR-CON M20) 20 MEQ tablet Take 1 tablet (20 mEq total) by mouth once a week. Take 1 tablet (20 mEq total) with Metolazone dose. 15 tablet 3   torsemide (DEMADEX) 20 MG tablet Take 4 tablets (80 mg total) by mouth daily. 200 tablet 3   No current facility-administered medications for this encounter.   BP 122/80   Pulse 83   Wt 58.5 kg (129 lb)  SpO2 100%   BMI 23.59 kg/m   Wt Readings from Last 3 Encounters:  02/14/23 58.5 kg (129 lb)  01/20/23 56.7 kg (125 lb)  11/18/22 56.9 kg (125 lb 6.4 oz)   PHYSICAL EXAM: General: NAD Neck: JVP 10 cm, no thyromegaly or thyroid nodule.  Lungs: Mild crackles at bases. CV: Nondisplaced PMI.  Heart regular S1/S2, no S3/S4, 2/6 SEM RUSB.  Trace ankle edema.  No carotid bruit.  Normal pedal pulses.  Abdomen: Soft, nontender, no hepatosplenomegaly, no distention.  Skin: Intact without lesions or rashes.  Neurologic: Alert and oriented x 3.  Psych: Normal affect. Extremities: No clubbing or cyanosis.  HEENT: Normal.   Assessment/Plan: 1. Chronic systolic CHF: Nonischemic cardiomyopathy.  Low EF for years.  Per patient's report, she had an endomyocardial biopsy at [facility] in 1988 showing myocarditis.  Medication titration has been limited by CKD stage IV. Has MDT CRT-D device, s/p AV nodal ablation with chronic AF. Echo in 7/21 showed EF 20-25%, moderate MR, mild AS.  Echo 12/17/20 showed EF 25-30%, severe LV dilation, RV normal, moderate TR, mod-severe AS with mean gradient 9 mmHg but AVA 0.76 cm^2.  TEE in 10/22 with EF 30%.  LHC in 10/22 with mild nonobstructive CAD.  Echo in 10/23 showed EF 25-30%, severe LV dilation, normal RV, mild MR, moderate AS with AVA 1.1 cm^2, mean gradient 9 mmHg (low flow low gradient moderate AS), small ASD. RHC (2/24) with mildly elevated RA pressure, moderate pulmonary venous hypertension, preserved CO and elevated PCWP with prominent  v-waves. Echo 2/24 showed EF 30-35%, likely low flow/low gradient moderate aortic stenosis, moderate MR & ASD visualized.  Echo in 6/24 with EF 30-35%, moderate RV dysfunction, moderate functional MR.  NYHA class III symptoms.  She is volume overloaded on exam, weight is up, and thoracic impedance trending down on Optivol.  - Stop Lasix, start torsemide 80 mg daily.  BMET/BNP today and again in 10 days.  - Increase metolazone to once weekly rather than every other week.  - Off Entresto with low BP and elevated creatinine.  - Continue losartan 12.5 mg daily. - Continue eplerenone 25 mg daily (nightmares on spiro).  - Continue Jardiance 10 mg daily.  - Continue Toprol XL 50 mg daily, cannot tolerate higher dose.  2.  Atrial fibrillation: Permanent.  S/p AV nodal ablation with BiV pacing.   - Continue warfarin.  INRs followed by [facility].  3. PVCs: Zio 10/22 with 12.6% PVCs. She has been feeling palpitations recently.  - She is wearing a Zio monitor currently.  - Continue Toprol. - Unable to tolerate amiodarone in the past.  - She had a rash with mexiletine and stopped it.  4. Aortic stenosis: Probably moderate low flow/low gradient aortic stenosis.  - Continue to follow.  5. ASD: Patient has a small ASD by echo with L=>R shunting, small. Qp/Qs on RHC 2/24 was 1.29.  Suspect not large enough to repair.  6. Mitral regurgitation: Moderate-severe functional MR.  Attempted PASCAL mTEER in 7/24 but procedure failed.  Will have to continue medical management.   7. Gout: Stable.  8. Calcified device lead vegetation: Blood cultures negative, PET-CT in 11/22 not suggestive of active endocarditis.  9. CKD stage IV: Follow BMET closely with diuretic adjustment.   Followup with APP in 2 wks.   Marca Ancona,  02/14/2023  [facility] [place] [address] [facility] [place] [address] [phone] (office) [phone] (fax)

## 2023-02-14 NOTE — Patient Instructions (Signed)
STOP Lasix  START Torsemide 20 mg daily.  CHANGE Metolazone 2.5 mg to every Tuesday with 1 potassium tablet  Labs done today, your results will be available in MyChart, we will contact you for abnormal readings.  Repeat blood work ina week.  Your physician recommends that you schedule a follow-up appointment as scheduled.  If you have any questions or concerns before your next appointment please send [place] a message through [place] or call our office at [phone].    TO LEAVE A MESSAGE FOR THE NURSE SELECT OPTION 2, PLEASE LEAVE A MESSAGE INCLUDING: YOUR NAME DATE OF BIRTH CALL BACK NUMBER REASON FOR CALL**this is important as we prioritize the call backs  YOU WILL RECEIVE A CALL BACK THE SAME DAY AS LONG AS YOU CALL BEFORE 4:00 PM  At the [facility], you and your health needs are our priority. As part of our continuing mission to provide you with exceptional heart care, we have created designated Provider Care Teams. These Care Teams include your primary Cardiologist (physician) and Advanced Practice Providers (APPs- Physician Assistants and Nurse Practitioners) who all work together to provide you with the care you need, when you need it.   You may see any of the following providers on your designated Care Team at your next follow up: Dr Arvilla Meres Dr Marca Ancona Dr. Dorthula Nettles Dr. Clearnce Hasten Amy Filbert Schilder, NP Robbie Lis, [place] [facility], [place] Brynda Peon, NP [place] Lee, NP Karle Plumber, PharmD   Please be sure to bring in all your medications bottles to every appointment.    Thank you for choosing [facility]

## 2023-03-02 DIAGNOSIS — I5022 Chronic systolic (congestive) heart failure: Secondary | ICD-10-CM | POA: Diagnosis not present

## 2023-03-03 DIAGNOSIS — R002 Palpitations: Secondary | ICD-10-CM | POA: Diagnosis not present

## 2023-03-03 LAB — BASIC METABOLIC PANEL
BUN/Creatinine Ratio: 24 (ref 12–28)
BUN: 76 mg/dL (ref 8–27)
CO2: 18 mmol/L — ABNORMAL LOW (ref 20–29)
Calcium: 7.9 mg/dL — ABNORMAL LOW (ref 8.7–10.3)
Chloride: 93 mmol/L — ABNORMAL LOW (ref 96–106)
Creatinine, Ser: 3.16 mg/dL — ABNORMAL HIGH (ref 0.57–1.00)
Glucose: 341 mg/dL — ABNORMAL HIGH (ref 70–99)
Potassium: 4.4 mmol/L (ref 3.5–5.2)
Sodium: 133 mmol/L — ABNORMAL LOW (ref 134–144)
eGFR: 15 mL/min/{1.73_m2} — ABNORMAL LOW (ref >59)

## 2023-03-07 ENCOUNTER — Telehealth: Payer: Self-pay

## 2023-03-07 NOTE — Telephone Encounter (Signed)
 Spoke with patient and requested manual remote transmission for ICM review.  Automatic transmission note received.  She attempted to send and received a question mark on the monitor.  Advised to [employer] number for further assistance and she agreed.

## 2023-03-08 NOTE — Telephone Encounter (Signed)
 Pt left voice mail message stating Emily [name] is sending her a new monitor and will receive in 7-10 days.  ICM remote transmission rescheduled for 03/21/2023.

## 2023-03-10 ENCOUNTER — Other Ambulatory Visit: Payer: Self-pay | Admitting: Family Medicine

## 2023-03-11 ENCOUNTER — Other Ambulatory Visit (HOSPITAL_COMMUNITY): Payer: Self-pay | Admitting: Cardiology

## 2023-03-11 ENCOUNTER — Other Ambulatory Visit (HOSPITAL_COMMUNITY): Payer: Self-pay

## 2023-03-13 ENCOUNTER — Ambulatory Visit (INDEPENDENT_AMBULATORY_CARE_PROVIDER_SITE_OTHER): Payer: PPO

## 2023-03-13 ENCOUNTER — Ambulatory Visit (HOSPITAL_COMMUNITY)
Admission: RE | Admit: 2023-03-13 | Discharge: 2023-03-13 | Disposition: A | Discharge status: Home / Self Care | Payer: PPO | Source: Ambulatory Visit | Attending: Family Medicine

## 2023-03-13 VITALS — BP 92/66 | HR 89 | Wt 121.0 lb

## 2023-03-13 DIAGNOSIS — Z7901 Long term (current) use of anticoagulants: Secondary | ICD-10-CM | POA: Insufficient documentation

## 2023-03-13 DIAGNOSIS — I428 Other cardiomyopathies: Secondary | ICD-10-CM | POA: Insufficient documentation

## 2023-03-13 DIAGNOSIS — K219 Gastro-esophageal reflux disease without esophagitis: Secondary | ICD-10-CM | POA: Diagnosis not present

## 2023-03-13 DIAGNOSIS — R002 Palpitations: Secondary | ICD-10-CM | POA: Diagnosis not present

## 2023-03-13 DIAGNOSIS — I4821 Permanent atrial fibrillation: Secondary | ICD-10-CM | POA: Insufficient documentation

## 2023-03-13 DIAGNOSIS — I2510 Atherosclerotic heart disease of native coronary artery without angina pectoris: Secondary | ICD-10-CM | POA: Diagnosis not present

## 2023-03-13 DIAGNOSIS — Q211 Atrial septal defect, unspecified: Secondary | ICD-10-CM | POA: Insufficient documentation

## 2023-03-13 DIAGNOSIS — R21 Rash and other nonspecific skin eruption: Secondary | ICD-10-CM | POA: Diagnosis not present

## 2023-03-13 DIAGNOSIS — R110 Nausea: Secondary | ICD-10-CM

## 2023-03-13 DIAGNOSIS — I272 Pulmonary hypertension, unspecified: Secondary | ICD-10-CM | POA: Diagnosis not present

## 2023-03-13 DIAGNOSIS — I420 Dilated cardiomyopathy: Secondary | ICD-10-CM

## 2023-03-13 DIAGNOSIS — Z79899 Other long term (current) drug therapy: Secondary | ICD-10-CM | POA: Diagnosis not present

## 2023-03-13 DIAGNOSIS — I959 Hypotension, unspecified: Secondary | ICD-10-CM | POA: Insufficient documentation

## 2023-03-13 DIAGNOSIS — N184 Chronic kidney disease, stage 4 (severe): Secondary | ICD-10-CM | POA: Insufficient documentation

## 2023-03-13 DIAGNOSIS — Z8679 Personal history of other diseases of the circulatory system: Secondary | ICD-10-CM | POA: Insufficient documentation

## 2023-03-13 DIAGNOSIS — I5022 Chronic systolic (congestive) heart failure: Secondary | ICD-10-CM

## 2023-03-13 DIAGNOSIS — I350 Nonrheumatic aortic (valve) stenosis: Secondary | ICD-10-CM | POA: Diagnosis not present

## 2023-03-13 DIAGNOSIS — Z7984 Long term (current) use of oral hypoglycemic drugs: Secondary | ICD-10-CM | POA: Diagnosis not present

## 2023-03-13 DIAGNOSIS — I493 Ventricular premature depolarization: Secondary | ICD-10-CM | POA: Insufficient documentation

## 2023-03-13 DIAGNOSIS — I080 Rheumatic disorders of both mitral and aortic valves: Secondary | ICD-10-CM | POA: Insufficient documentation

## 2023-03-13 DIAGNOSIS — I340 Nonrheumatic mitral (valve) insufficiency: Secondary | ICD-10-CM

## 2023-03-13 DIAGNOSIS — M109 Gout, unspecified: Secondary | ICD-10-CM | POA: Insufficient documentation

## 2023-03-13 LAB — CUP PACEART REMOTE DEVICE CHECK
Battery Remaining Longevity: 85 mo
Battery Voltage: 2.98 V
Brady Statistic AP VP Percent: 0 %
Brady Statistic AP VS Percent: 0 %
Brady Statistic AS VP Percent: 0 %
Brady Statistic AS VS Percent: 0 %
Brady Statistic RA Percent Paced: 0 %
Brady Statistic RV Percent Paced: 91.24 %
Date Time Interrogation Session: 20250113001603
HighPow Impedance: 67 Ohm
Implantable Lead Connection Status: 753985
Implantable Lead Connection Status: 753985
Implantable Lead Connection Status: 753985
Implantable Lead Implant Date: 20050218
Implantable Lead Implant Date: 20050218
Implantable Lead Implant Date: 20050218
Implantable Lead Location: 753858
Implantable Lead Location: 753859
Implantable Lead Location: 753860
Implantable Lead Model: 4194
Implantable Lead Model: 5076
Implantable Lead Model: 6947
Implantable Pulse Generator Implant Date: 20230313
Lead Channel Impedance Value: 304 Ohm
Lead Channel Impedance Value: 323 Ohm
Lead Channel Impedance Value: 399 Ohm
Lead Channel Impedance Value: 532 Ohm
Lead Channel Impedance Value: 589 Ohm
Lead Channel Impedance Value: 817 Ohm
Lead Channel Pacing Threshold Amplitude: 1 V
Lead Channel Pacing Threshold Amplitude: 1 V
Lead Channel Pacing Threshold Pulse Width: 0.4 ms
Lead Channel Pacing Threshold Pulse Width: 0.4 ms
Lead Channel Sensing Intrinsic Amplitude: 1 mV
Lead Channel Sensing Intrinsic Amplitude: 2.875 mV
Lead Channel Sensing Intrinsic Amplitude: 2.875 mV
Lead Channel Setting Pacing Amplitude: 2 V
Lead Channel Setting Pacing Amplitude: 2.5 V
Lead Channel Setting Pacing Pulse Width: 0.4 ms
Lead Channel Setting Pacing Pulse Width: 0.4 ms
Lead Channel Setting Sensing Sensitivity: 0.3 mV
Zone Setting Status: 755011

## 2023-03-13 LAB — BASIC METABOLIC PANEL
Anion gap: 17 — ABNORMAL HIGH (ref 5–15)
BUN: 87 mg/dL — ABNORMAL HIGH (ref 8–23)
CO2: 24 mmol/L (ref 22–32)
Calcium: 8.6 mg/dL — ABNORMAL LOW (ref 8.9–10.3)
Chloride: 91 mmol/L — ABNORMAL LOW (ref 98–111)
Creatinine, Ser: 2.83 mg/dL — ABNORMAL HIGH (ref 0.44–1.00)
GFR, Estimated: 17 mL/min — ABNORMAL LOW (ref >60)
Glucose, Bld: 256 mg/dL — ABNORMAL HIGH (ref 70–99)
Potassium: 4 mmol/L (ref 3.5–5.1)
Sodium: 132 mmol/L — ABNORMAL LOW (ref 135–145)

## 2023-03-13 LAB — PROTIME-INR
INR: 2.3 — ABNORMAL HIGH (ref 0.8–1.2)
Prothrombin Time: 25.8 s — ABNORMAL HIGH (ref 11.4–15.2)

## 2023-03-13 LAB — BRAIN NATRIURETIC PEPTIDE: B Natriuretic Peptide: 774.2 pg/mL — ABNORMAL HIGH (ref 0.0–100.0)

## 2023-03-13 MED ORDER — METOPROLOL SUCCINATE ER 100 MG PO TB24: 100.0000 mg | ORAL_TABLET | Freq: Every evening | ORAL | 6 refills | Status: DC

## 2023-03-13 MED ORDER — OMEPRAZOLE 40 MG PO CPDR: 40.0000 mg | DELAYED_RELEASE_CAPSULE | Freq: Every evening | ORAL | 0 refills | Status: DC

## 2023-03-13 NOTE — Patient Instructions (Addendum)
 Thank you for coming in today  If you had labs drawn today, any labs that are abnormal the clinic will call you No news is good news  Medications: Take Metoprolol  at night Take Omeprazole  at night    Follow up appointments:  Your physician recommends that you schedule a follow-up appointment in:  2 months With Dr. Rolan     Do the following things EVERYDAY: Weigh yourself in the morning before breakfast. Write it down and keep it in a log. Take your medicines as prescribed Eat low salt foods--Limit salt (sodium) to 2000 mg per day.  Stay as active as you can everyday Limit all fluids for the day to less than 2 liters   At the [facility], you and your health needs are our priority. As part of our continuing mission to provide you with exceptional heart care, we have created designated Provider Care Teams. These Care Teams include your primary Cardiologist (physician) and Advanced Practice Providers (APPs- Physician Assistants and Nurse Practitioners) who all work together to provide you with the care you need, when you need it.   You may see any of the following providers on your designated Care Team at your next follow up: Dr Toribio Fuel Dr Ezra Rolan Dr. Ria Gardenia Greig Lenetta, NP Caffie Shed, [place] [facility] [place], [place] Beckey Coe, NP Tinnie Redman, PharmD   Please be sure to bring in all your medications bottles to every appointment.    Thank you for choosing [facility]  If you have any questions or concerns before your next appointment please send us  a message through [place] or call our office at [phone].    TO LEAVE A MESSAGE FOR THE NURSE SELECT OPTION 2, PLEASE LEAVE A MESSAGE INCLUDING: YOUR NAME DATE OF BIRTH CALL BACK NUMBER REASON FOR CALL**this is important as we prioritize the call backs  YOU WILL RECEIVE A CALL BACK THE SAME DAY AS LONG AS YOU CALL BEFORE  4:00 PM

## 2023-03-13 NOTE — Progress Notes (Addendum)
 ADVANCED HF CLINIC NOTE  Primary Care: Emily Shutt, DO Primary Cardiologist: Dr. Debera EP: Dr. Waddell Nephrology: [facility] HF Cardiologist: [name], MD  Chief Complaint: Heart Failure Follow-up  HPI: 73 y.o. with history of CKD stage IV, permanent atrial fibrillation, and a long-standing nonischemic cardiomyopathy.  She first developed cardiac problems in 1988.  At that time, she was admitted with CHF and eventually underwent myocardial biopsy at [facility], per her report showing myocarditis. She had MDT CRT-D device implanted. Echo in 7/21 showed EF 20-25%, moderate MR, mild AS. She is now in permanent atrial fibrillation and is s/p AV nodal ablation with BiV pacing. Elevated creatinine has limited medication management.    Echo in 10/22 showed EF 25-30%, severe LV dilation, RV normal, moderate TR, mod-severe AS with mean gradient 9 mmHg but AVA 0.76 cm^2 (?low gradient severe AS), mild-moderate MR, left=>right shunt possible ASD.  TEE done in 10/22 to more closely assess AS.  This showed EF 30%, moderate LV dilation, normal RV, possible calcified vegetation on the ICD lead, small secundum ASD, peak RV-RA gradient 23 mmHg, low flow/low gradient moderate AS, mild-moderate MR. RHC/LHC was done, showing mild nonobstructive CAD, left to right atrial level shunt with Qp/Qs 1.43.  I reviewed TEE and cath report with Dr. Wonda.  We decided that AS was not severe enough yet for TAVR and that the ASD was not significant enough to close percutaneously. With possible calcified vegetation on ICD, we drew blood cultures which were negative.  She saw ID and had a PET-CT, which did not suggest active endocarditis.   Zio patch in 10/22 showed 12.6% PVCs, this was thought to be limiting her BiV pacing. She has a history of intolerance of amiodarone so was started on mexiletine. She says that this caused a rash so she stopped it and rash resolved. She also decreased Toprol  XL to 100 mg daily due to nightmares  when taking 150 mg daily. She subsequently has decreased Toprol  XL to 50 mg daily.   Echo in 10/23 showed EF 25-30%, severe LV dilation, normal RV, mild MR, moderate AS with AVA 1.1 cm^2, mean gradient 9 mmHg (low flow low gradient moderate AS).   Follow up 2/24, volume stable but worse NYHA III symptoms with recent syncope concerning for orthostasis.  Zio 7-day (2/24): 7 runs of NSVT, continuous atrial fibrillation  RHC (2/24) showed elevated PCWP with prominent V-waves, mildly elevated RA pressure, preserved CO, moderate pulmonary venous hypertension, small ASD (Qp/Qs 1.29). Echo showed EF 30-35%, likely low flow/low gradient mild-moderate aortic stenosis.   Follow up with Dr. Waddell 3/24, with increased PVC burden, her pacing rate was increased from 60-80 and PVCs improved.   TEE 4/24 with LVEF 30-35%, LV w/ RWA, RV mildly reduced, smoke noted in LAA, RA mildly dilated, small secundum ASD 0.45 cm in diameter, TR mild-mod. Mild-mod aortic valve stenosis; AV mean gradient with AVA 1.93 cm^2; moderate-severe functional MR.   Echo in 6/24 with EF 30-35%, moderately decreased RV systolic function, moderate functional MR.   PASCAL mTEER was attempted in 7/24.  Unfortunately, the procedure was unsuccessful.    Weekly metolazone  2.5 mg + 20 KCL added to diuretic regimen 01/09/23 due to ongoing symptoms and OptiVol elevation.  Zio patch placed for palpitations (12/24) showing predominantly Afib with PVC burden of 5.5% and short runs of NSVT.   Today she returns for HF follow up. Overall feeling poor. Biggest complaint is daily nausea with occasional vomiting that starts around dinner  time. Reports severe fatigue often where she is unable to get from room to room. Has chronic cough. Palpitations have improved. Denies CP, edema, or dizziness. Appetite poor with nausea. No fever or chills. Weight is down 8lbs since last seen. Taking all medications.   ECG (personally reviewed): V-Paced 82 bpm    Medtronic device interrogation (personally reviewed): 91.7% VP, 7 episodes of NSVT, 0.3h/day activity, Optivol fluid stable.  Labs (12/21): K 4, creatinine 1.77 Labs (2/22): TSH normal Labs (5/22): K 5, creatinine 2.42 Labs (9/22): K 4.3, creatinine 1.73, LDL 90 Labs (10/22): K 4.2, creatinine 2.18 Labs (2/23): K 3.8, creatinine 2.42 Labs (11/23): K 3.6, creatinine 2.22 Labs (2/24): K 3.9, creatinine 2.43 Labs (3/24): K 3.6, SCr 2.01 Labs (4/24): K 3.1, SCr 1.3 Labs (7/24): K 4.8, creatinine 2.18 Labs (8/24): K 4.5, creatinine 2.35 Labs (11/24): K 4.6, creatinine 2.98 => 2.88 Labs (12/24): K 3.9, creatintine 2.94, BNP 804 Labs (1/25): K 4.4, creatinine 3.14, BUN 76  PMH: 1. Gout 2. Complete heart block: Medtronic CRT device.  3. CKD stage IV: Diabetic nephropathy.  4. Type 2 diabetes 5. Atrial fibrillation: Now permanent.   - AF ablation 4/18.  - AV nodal ablation in 2019 with MDT CRT-D.  6. Remote LV thrombus 7. Chronic systolic CHF: Nonischemic cardiomyopathy. Medtronic CRT-D device.  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  - LHC 2004: Nonobstructive mild CAD.  - Echo (8/19): EF 10-15%, mildly dilated LV, mild-moderately dilated RV with mild-moderately decreased systolic function.  - Echo (7/21): EF 20-25%, low normal RV function, moderate MR, mild AS.  - Echo (10/22): EF 25-30%, severe LV dilation, RV normal, moderate TR, mod-severe AS with mean gradient 9 mmHg but AVA 0.76 cm^2 (?low gradient severe AS), mild-moderate MR, left=>right shunt possible ASD.  - Echo (10/23): EF 25-30%, severe LV dilation, normal RV, mild MR, moderate AS with AVA 1.1 cm^2, mean gradient 9 mmHg (low flow low gradient moderate AS). ASD noted.  - Echo (2/24): EF 30-35% (on Dr. Orvilla read), mildly dilated LV, normal RV, moderate MR, moderate AS with AVA  0.85 cm , mean gradient 13.5 mmHg (moderate low flow/low gradient aortic stenosis) - RHC/LHC (10/22): mild  nonobstructive CAD, left to right atrial level shunt with Qp/Qs 1.43.  - RHC (2/24): RA mean 8, PA 52/21 (mean 31, PCPW mean 20 with prominent v-waves to 39, CO/CI (Fick) 4.92/3.09, PVR 2.2, Qp/Qs 1.29=>suggesting small ASD - TEE 4/24: LVEF 30-35%, LV w/ RWA, RV mildly reduced, smoke noted in LAA, RA mildly dilated, small secund ASD, 0.45 cm in diameter, TR mild-mod. Mild-mod aortic valve stenosis. AV mean gradient with AVA 1.93 cm^2; moderate-severe MR.  - Echo (6/24): EF 30-35%, moderately decreased RV systolic function, moderate functional MR.  8. H/o cholecystectomy 9. Hypothyroidism 10. 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 Aortic stenosis: TEE (10/22) showed EF 30%, moderate LV dilation, normal RV, possible calcified vegetation on the ICD lead, small secundum ASD, peak RV-RA gradient 23 mmHg, low flow/low gradient moderate AS, mild-moderate MR.  - Echo in 10/23 confirmed moderate low flow/low gradient AS.  - Echo (2/24) probably moderate low flow/low gradient - Echo (4/24) AV mean gradient with AVA 1.93 cm^2 13. 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  14. 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  - RHC in 10/22 showed atrial level shunt with Qp/Qs 1.43.  - RHC 2/24 showed Qp/Qs 1.29 -  TEE 4/24: small secundum ASD, 0.45 cm in diameter 15. PVCs: Zio monitor (10/22) with 12.6% PVCs. - Intolerant of both amiodarone and mexiletine.  16. Mitral regurgitation: Most studies have shown moderate-severe functional MR.  - She failed PASCAL mTEER in 7/24, will continue medical management.   SH: Married, lives in [place].  No smoking or ETOH.   Family History  Problem Relation Age of Onset   Hip fracture Mother    COPD Father    CAD Sister    Heart attack Sister    ROS: All systems reviewed and negative except as per HPI.   Current Outpatient Medications  Medication Sig Dispense Refill   acetaminophen   (TYLENOL ) 500 MG tablet Take 500 mg by mouth at bedtime as needed for mild pain.     allopurinol (ZYLOPRIM) 100 MG tablet take 2 tablets in the morning and 1 tablet in the evening. 100 tablet 11   Calcium Carb-Cholecalciferol (CALCIUM + VITAMIN D3 PO) Take 1 tablet by mouth daily.     empagliflozin  (JARDIANCE ) 10 MG TABS tablet Take 1 tablet (10 mg total) by mouth daily before breakfast. 30 tablet 11   eplerenone  (INSPRA ) 25 MG tablet Take 1 tablet (25 mg total) by mouth daily. 90 tablet 3   Glucosamine-Chondroitin (OSTEO BI-FLEX REGULAR STRENGTH PO) Take 1 tablet by mouth in the morning.     insulin  glargine, 2 Unit Dial, (TOUJEO  MAX SOLOSTAR) 300 UNIT/ML Solostar Pen Inject 24 Units into the skin at bedtime. 9 mL 2   insulin  lispro (HUMALOG) 100 UNIT/ML injection Inject 4-8 Units into the skin daily as needed for high blood sugar. Sliding scale Less than 150 =0 150-200=4 Units 201-250=5 Units 251-300=6 Units 301-350=7 Units 351-400=8 Units     INSULIN  SYRINGE .5CC/29G 29G X 1/2 0.5 ML MISC Use to give yourself daily basal insulin  (Lantus  or Tuojeo). 100 each 6   levothyroxine  (SYNTHROID ) 88 MCG tablet Take 1 tablet (88 mcg total) by mouth daily before breakfast. 90 tablet 3   losartan  (COZAAR ) 25 MG tablet Take 0.5 tablets (12.5 mg total) by mouth daily. 15 tablet 11   metolazone  (ZAROXOLYN ) 2.5 MG tablet Take 1 tablet (2.5 mg total) by mouth once a week. Every Tuesday  Take 1 Potassium 20 mEq tablet on Metolazone  days. 15 tablet 3   metoprolol  succinate (TOPROL -XL) 100 MG 24 hr tablet Take 100 mg by mouth daily. Take with or immediately following a meal.     nitroGLYCERIN  (NITROSTAT ) 0.4 MG SL tablet Place 1 tablet (0.4 mg total) under the tongue every 5 (five) minutes as needed for chest pain. 90 tablet 3   Omega-3 Fatty Acids (FISH OIL) 1200 MG CPDR Take 1,200 mg by mouth in the morning.     omeprazole  (PRILOSEC) 40 MG capsule TAKE 1 CAPSULE BY MOUTH ONCE DAILY. 90 capsule 1   potassium  chloride SA (KLOR-CON  M20) 20 MEQ tablet Take 1 tablet (20 mEq total) by mouth once a week. Take 1 tablet (20 mEq total) with Metolazone  dose. 15 tablet 3   simvastatin  (ZOCOR ) 20 MG tablet take 1 tablet every day at 6 in the evening 90 tablet 1   torsemide  (DEMADEX ) 20 MG tablet Take 4 tablets (80 mg total) by mouth daily. 200 tablet 3   warfarin (COUMADIN ) 2 MG tablet Take 1 tablet by mouth daily except 2 tablets on Tuesday, Thursday, & Saturday or as directed by Anticoagulation Clinic. 40 tablet 3   No current facility-administered medications for this encounter.   BP  92/66   Pulse 89   Wt 54.9 kg (121 lb)   SpO2 100%   BMI 22.13 kg/m   Wt Readings from Last 3 Encounters:  03/13/23 54.9 kg (121 lb)  02/14/23 58.5 kg (129 lb)  01/20/23 56.7 kg (125 lb)   PHYSICAL EXAM: General: Elderly, frail appearing. No distress on RA HEENT: neck supple.   Cardiac: JVP not visible. S1 and S2 present. No murmurs or rub. Resp: Lung sounds clear and equal B/L Abdomen: Soft, non-tender, non-distended. + BS. Extremities: Warm and dry. No rash, cyanosis.  No edema. +compression socks Neuro: Alert and oriented x3. Affect pleasant. Moves all extremities without difficulty.  Assessment/Plan: 1. Chronic systolic CHF: Nonischemic cardiomyopathy.  Low EF for years.  Per patient's report, she had an endomyocardial biopsy at [facility] in 1988 showing myocarditis.  Medication titration has been limited by CKD stage IV. Has MDT CRT-D device, s/p AV nodal ablation with chronic AF. Echo in 7/21 showed EF 20-25%, moderate MR, mild AS.  Echo 12/17/20 showed EF 25-30%, severe LV dilation, RV normal, moderate TR, mod-severe AS with mean gradient 9 mmHg but AVA 0.76 cm^2.  TEE in 10/22 with EF 30%.  LHC in 10/22 with mild nonobstructive CAD.  Echo in 10/23 showed EF 25-30%, severe LV dilation, normal RV, mild MR, moderate AS with AVA 1.1 cm^2, mean gradient 9 mmHg (low flow low gradient moderate AS), small ASD. RHC (2/24) with  mildly elevated RA pressure, moderate pulmonary venous hypertension, preserved CO and elevated PCWP with prominent v-waves. Echo 2/24 showed EF 30-35%, likely low flow/low gradient moderate aortic stenosis, moderate MR & ASD visualized.  Echo in 6/24 with EF 30-35%, moderate RV dysfunction, moderate functional MR. NYHA class III-early IV symptoms, confounded by worsening renal function and possible dehydration. Not volume up on exam, Optivol stable.  - Continue torsemide  80 mg daily. BNP/BMET today. - Metolazone  once weekly on Tuesdays. May need to hold tomorrow's dose pending BMET today. - Continue eplerenone  25 mg daily (nightmares on spiro).  - Continue Jardiance  10 mg daily.  - Continue Toprol  XL 100 mg daily. Start taking at bedtime. - Off Entresto  with low BP and elevated creatinine.  - Off Losartan  with increase in BUN/Cr 2.  Atrial fibrillation: Permanent.  S/p AV nodal ablation with BiV pacing.   - Continue warfarin. INRs [facility].  3. PVCs: Zio 10/22 with 12.6% PVCs. She has been feeling palpitations recently.  - Zio (12/24): predominantly Afib. 12 short NSVT runs, PVC burden 5.5% - Continue Toprol  as above - Unable to tolerate amiodarone in the past.  - She had a rash with mexiletine and stopped it.  4. Aortic stenosis: Probably moderate low flow/low gradient aortic stenosis. Continue to follow.  5. ASD: Patient has a small ASD by echo with L=>R shunting, small. Qp/Qs on RHC 2/24 was 1.29.  Suspect not large enough to repair.  6. Mitral regurgitation: Moderate-severe functional MR. Attempted PASCAL mTEER in 7/24 but procedure failed.  Will have to continue medical management.   7. Gout: Stable.  8. Calcified device lead vegetation: Blood cultures negative, PET-CT in 11/22 not suggestive of active endocarditis.  9. CKD stage IV: Follow BMET closely with diuretic adjustment.  - Followed by [facility]  Follow up with Dr. Rolan in 2 month.   Emily Soth,  NP 03/13/2023

## 2023-03-13 NOTE — Addendum Note (Signed)
 Encounter addended by: Donnelle Rubey, [place], NP on: 03/13/2023 3:13 PM  Actions taken: Clinical Note Signed

## 2023-03-14 ENCOUNTER — Ambulatory Visit: Payer: PPO | Attending: Internal Medicine

## 2023-03-14 ENCOUNTER — Ambulatory Visit: Payer: PPO

## 2023-03-14 ENCOUNTER — Other Ambulatory Visit: Payer: Self-pay

## 2023-03-14 DIAGNOSIS — Z95810 Presence of automatic (implantable) cardiac defibrillator: Secondary | ICD-10-CM

## 2023-03-14 DIAGNOSIS — I5022 Chronic systolic (congestive) heart failure: Secondary | ICD-10-CM

## 2023-03-14 MED ORDER — EMPAGLIFLOZIN 10 MG PO TABS
10.0000 mg | ORAL_TABLET | Freq: Every day | ORAL | 11 refills | Status: DC
  Filled 2023-03-14 – 2023-03-17 (×4): qty 30, 30d supply, fill #0
  Filled 2023-04-17: qty 30, 30d supply, fill #1

## 2023-03-15 ENCOUNTER — Encounter: Payer: Self-pay | Admitting: Pharmacist

## 2023-03-15 ENCOUNTER — Other Ambulatory Visit: Payer: Self-pay

## 2023-03-15 ENCOUNTER — Other Ambulatory Visit (HOSPITAL_COMMUNITY): Payer: Self-pay

## 2023-03-16 ENCOUNTER — Other Ambulatory Visit: Payer: Self-pay

## 2023-03-16 ENCOUNTER — Encounter: Payer: Self-pay | Admitting: Pharmacist

## 2023-03-17 ENCOUNTER — Other Ambulatory Visit (HOSPITAL_COMMUNITY): Payer: Self-pay

## 2023-03-17 NOTE — Progress Notes (Signed)
 EPIC Encounter for ICM Monitoring  Patient Name: Emily [name] is a 73 y.o. female Date: 03/17/2023 Primary Care Physican: Cleotilde Vasko, DO Primary Cardiologist: [facility] Electrophysiologist: Waddell Nephrologist: [facility] Associate-Dr Clayborn  Bi-V Pacing: 91.2%  06/08/2022 Weight: 122-123 lbs 09/20/2022 Weight: 119-121 lbs 10/03/2022 Weight: 120 lbs 12/07/2022 Weight: 123-124 lbs    Spoke with patient and heart failure questions reviewed.  Transmission results reviewed.  Pt asymptomatic for fluid accumulation.  Feeling tired and no energy.         Optivol Thoracic impedance returned to normal after taking prescribed Furosemide  and Metolazone  dosage.   Prescribed: Furosemide  80 mg Take 1 tablet (80 mg total) by mouth twice a day     Metolazone  2.5 mg take 1 tablet by mouth weekly.  Take 20 mEq potassium with weekly dose.  Potassium 20 mEq take 1 tablet by mouth with weekly Metolazone  dosage Eplerenone  25 mg take 1 tablet by mouth daily     Labs: 01/12/2023 Creatinine 2.98, BUN 52, Potassium 4.6, Sodium 130, GFR 16 10/21/2022 Creatinine 2.35, BUN 45, Potassium 4.5, Sodium 136, GFR 21  10/11/2022 Creatinine 2.36, BUN 35, Potassium 4.1, Sodium 138  09/07/2022 Creatinine 2.42, BUN 60, Potassium 4.5, Sodium 132, GFR 20 08/24/2022 Creatinine 2.37, BUN 45, Potassium 4.3, Sodium 133, GFR 21 A complete set of results can be found in Results Review.   Recommendations:  No changes and encouraged to call if experiencing any fluid symptoms.   Follow-up plan: ICM clinic phone appointment on 04/17/2023.  91 day device clinic remote transmission 06/13/2023.      EP/Cardiology Office Visits:  05/08/2023 with Dr Waddell.  05/25/2023 with Dr Rolan.       Copy of ICM check sent to Dr. Waddell.  3 month ICM trend: 03/13/2023.    12-14 Month ICM trend:     Mitzie GORMAN Garner, RN 03/17/2023 3:16 PM

## 2023-03-20 ENCOUNTER — Other Ambulatory Visit: Payer: Self-pay

## 2023-03-20 ENCOUNTER — Other Ambulatory Visit (HOSPITAL_COMMUNITY): Payer: Self-pay

## 2023-03-21 ENCOUNTER — Other Ambulatory Visit: Payer: Self-pay

## 2023-03-24 DIAGNOSIS — D631 Anemia in chronic kidney disease: Secondary | ICD-10-CM | POA: Diagnosis not present

## 2023-03-24 DIAGNOSIS — Z79899 Other long term (current) drug therapy: Secondary | ICD-10-CM | POA: Diagnosis not present

## 2023-03-24 DIAGNOSIS — R54 Age-related physical debility: Secondary | ICD-10-CM | POA: Diagnosis not present

## 2023-03-24 DIAGNOSIS — R112 Nausea with vomiting, unspecified: Secondary | ICD-10-CM | POA: Diagnosis not present

## 2023-03-24 DIAGNOSIS — T383X6A Underdosing of insulin and oral hypoglycemic [antidiabetic] drugs, initial encounter: Secondary | ICD-10-CM | POA: Diagnosis not present

## 2023-03-24 DIAGNOSIS — Z7984 Long term (current) use of oral hypoglycemic drugs: Secondary | ICD-10-CM | POA: Diagnosis not present

## 2023-03-24 DIAGNOSIS — E1122 Type 2 diabetes mellitus with diabetic chronic kidney disease: Secondary | ICD-10-CM | POA: Diagnosis not present

## 2023-03-24 DIAGNOSIS — E871 Hypo-osmolality and hyponatremia: Secondary | ICD-10-CM | POA: Diagnosis not present

## 2023-03-24 DIAGNOSIS — R739 Hyperglycemia, unspecified: Secondary | ICD-10-CM | POA: Diagnosis not present

## 2023-03-24 DIAGNOSIS — B349 Viral infection, unspecified: Secondary | ICD-10-CM | POA: Diagnosis not present

## 2023-03-24 DIAGNOSIS — E039 Hypothyroidism, unspecified: Secondary | ICD-10-CM | POA: Diagnosis not present

## 2023-03-24 DIAGNOSIS — R42 Dizziness and giddiness: Secondary | ICD-10-CM | POA: Diagnosis not present

## 2023-03-24 DIAGNOSIS — K219 Gastro-esophageal reflux disease without esophagitis: Secondary | ICD-10-CM | POA: Diagnosis not present

## 2023-03-24 DIAGNOSIS — Z1152 Encounter for screening for COVID-19: Secondary | ICD-10-CM | POA: Diagnosis not present

## 2023-03-24 DIAGNOSIS — R059 Cough, unspecified: Secondary | ICD-10-CM | POA: Diagnosis not present

## 2023-03-24 DIAGNOSIS — I428 Other cardiomyopathies: Secondary | ICD-10-CM | POA: Diagnosis not present

## 2023-03-24 DIAGNOSIS — I959 Hypotension, unspecified: Secondary | ICD-10-CM | POA: Diagnosis not present

## 2023-03-24 DIAGNOSIS — I700 Atherosclerosis of aorta: Secondary | ICD-10-CM | POA: Diagnosis not present

## 2023-03-24 DIAGNOSIS — Z79890 Hormone replacement therapy (postmenopausal): Secondary | ICD-10-CM | POA: Diagnosis not present

## 2023-03-24 DIAGNOSIS — I517 Cardiomegaly: Secondary | ICD-10-CM | POA: Diagnosis not present

## 2023-03-24 DIAGNOSIS — I4891 Unspecified atrial fibrillation: Secondary | ICD-10-CM | POA: Diagnosis not present

## 2023-03-24 DIAGNOSIS — J101 Influenza due to other identified influenza virus with other respiratory manifestations: Secondary | ICD-10-CM | POA: Diagnosis not present

## 2023-03-24 DIAGNOSIS — J9811 Atelectasis: Secondary | ICD-10-CM | POA: Diagnosis not present

## 2023-03-24 DIAGNOSIS — Z7901 Long term (current) use of anticoagulants: Secondary | ICD-10-CM | POA: Diagnosis not present

## 2023-03-24 DIAGNOSIS — E785 Hyperlipidemia, unspecified: Secondary | ICD-10-CM | POA: Diagnosis not present

## 2023-03-24 DIAGNOSIS — R001 Bradycardia, unspecified: Secondary | ICD-10-CM | POA: Diagnosis not present

## 2023-03-24 DIAGNOSIS — N184 Chronic kidney disease, stage 4 (severe): Secondary | ICD-10-CM | POA: Diagnosis not present

## 2023-03-24 DIAGNOSIS — I5022 Chronic systolic (congestive) heart failure: Secondary | ICD-10-CM | POA: Diagnosis not present

## 2023-03-24 DIAGNOSIS — Z794 Long term (current) use of insulin: Secondary | ICD-10-CM | POA: Diagnosis not present

## 2023-03-24 DIAGNOSIS — E1165 Type 2 diabetes mellitus with hyperglycemia: Secondary | ICD-10-CM | POA: Diagnosis not present

## 2023-03-24 DIAGNOSIS — E876 Hypokalemia: Secondary | ICD-10-CM | POA: Diagnosis not present

## 2023-03-24 DIAGNOSIS — I480 Paroxysmal atrial fibrillation: Secondary | ICD-10-CM | POA: Diagnosis not present

## 2023-03-24 DIAGNOSIS — R0902 Hypoxemia: Secondary | ICD-10-CM | POA: Diagnosis not present

## 2023-03-24 DIAGNOSIS — Z91141 Patient's other noncompliance with medication regimen due to financial hardship: Secondary | ICD-10-CM | POA: Diagnosis not present

## 2023-03-27 ENCOUNTER — Telehealth: Payer: Self-pay

## 2023-03-27 ENCOUNTER — Encounter: Payer: Self-pay | Admitting: Cardiology

## 2023-03-27 NOTE — Transitions of Care (Post Inpatient/ED Visit) (Signed)
   03/27/2023  Name: TELESHIA LEMERE MRN: [id number] DOB: May 30, 1950  Today's TOC FU Call Status: Today's TOC FU Call Status:: Unsuccessful Call (1st Attempt) Unsuccessful Call (1st Attempt) Date: 03/27/23  Attempted to reach the patient regarding the most recent Inpatient/ED visit.Patient was called in an Outreach attempt to offer VBCI  30-day TOC program. Pt is eligible for program due to potential risk for readmission and/or high utilization. Unfortunately, I was not able to speak with the patient in regards to recent hospital discharge Unable to leave message patient voice mail is not set up    Follow Up Plan: Additional outreach attempts will be made to reach the patient to complete the Transitions of Care (Post Inpatient/ED visit) call.   Susa Loffler , BSN, RN [facility]   VBCI-Population Health RN Care Manager Direct Dial [phone]  Fax: [phone] Website: [email or web address]

## 2023-03-28 ENCOUNTER — Telehealth: Payer: Self-pay

## 2023-03-28 NOTE — Transitions of Care (Post Inpatient/ED Visit) (Signed)
03/28/2023  Name: Emily [name] MRN: [id number] DOB: August 14, 1950  Today's TOC FU Call Status: Today's TOC FU Call Status:: Successful TOC FU Call Completed TOC FU Call Complete Date: 03/28/23 Patient's Name and Date of Birth confirmed.  Transition Care Management Follow-up Telephone Call Date of Discharge: 03/26/23 Discharge Facility: Other [facility]) Name of Other (Non[facility]) Discharge Facility: [facility] Type of Discharge: Inpatient Admission Primary Inpatient Discharge Diagnosis:: Influenza A H1N1 Infection How have you been since you were released from the hospital?: Better (Patient feeling better, she notes everone in her home is sick.) Any questions or concerns?: No  Items Reviewed: Did you receive and understand the discharge instructions provided?: Yes Medications obtained,verified, and reconciled?: Yes (Medications Reviewed) Any new allergies since your discharge?: No Dietary orders reviewed?: No Do you have support at home?: Yes People in Home: spouse Name of Support/Comfort Primary Source: Peyton Najjar  Medications Reviewed Today: Medications Reviewed Today     Reviewed by Jodelle Gross, RN (Case Manager) on 03/28/23 at 1113  Med List Status: <None>   Medication Order Taking? Sig Documenting Provider Last Dose Status Informant  acetaminophen (TYLENOL) 500 MG tablet [id number] Yes Take 500 mg by mouth at bedtime as needed for mild pain. [provider] Taking Active Self  allopurinol (ZYLOPRIM) 100 MG tablet [id number] Yes take 2 tablets in the morning and 1 tablet in the evening. Valetta Close, MD Taking Active Self  Biotin 1000 MCG tablet [id number] Yes Take 1,000 mcg by mouth daily. [provider] Taking Active   Calcium Carb-Cholecalciferol (CALCIUM + VITAMIN D3 PO) [id number] Yes Take 1 tablet by mouth daily. [provider] Taking Active Self  empagliflozin (JARDIANCE) 10 MG TABS tablet [id number] Yes Take 1 tablet (10 mg  total) by mouth daily before breakfast. Laurey Morale, MD Taking Active   eplerenone (INSPRA) 25 MG tablet [id number] Yes Take 1 tablet (25 mg total) by mouth daily. Laurey Morale, MD Taking Active   Glucosamine-Chondroitin (OSTEO BI-FLEX REGULAR STRENGTH PO) [id number] Yes Take 1 tablet by mouth in the morning. [provider] Taking Active Self  insulin glargine, 2 Unit Dial, (TOUJEO MAX SOLOSTAR) 300 UNIT/ML Solostar Pen [id number] Yes Inject 24 Units into the skin at bedtime. Valetta Close, MD Taking Active Self  insulin lispro (HUMALOG) 100 UNIT/ML injection [id number] Yes Inject 4-8 Units into the skin daily as needed for high blood sugar. Sliding scale Less than 150 =0 150-200=4 Units 201-250=5 Units 251-300=6 Units 301-350=7 Units 351-400=8 Units [provider] Taking Active Self  INSULIN SYRINGE .5CC/29G 29G X 1/2" 0.5 ML MISC [id number] Yes Use to give yourself daily basal insulin (Lantus or Tuojeo). Valetta Close, MD Taking Active Self  levothyroxine (SYNTHROID) 88 MCG tablet [id number] Yes Take 1 tablet (88 mcg total) by mouth daily before breakfast. Valetta Close, MD Taking Active Self  losartan (COZAAR) 25 MG tablet [id number] No Take 0.5 tablets (12.5 mg total) by mouth daily.  Patient not taking: Reported on 03/28/2023   Jacklynn Ganong, FNP Not Taking Active   metolazone (ZAROXOLYN) 2.5 MG tablet [id number] Yes Take 1 tablet (2.5 mg total) by mouth once a week. Every Tuesday  Take 1 Potassium 20 mEq tablet on Metolazone days. Laurey Morale, MD Taking Active   metoprolol succinate (TOPROL-XL) 100 MG 24 hr tablet [id number] Yes Take 1 tablet (100 mg total) by mouth at bedtime. Take with or immediately following a meal. Lee, [place], NP Taking Active   nitroGLYCERIN (NITROSTAT) 0.4  MG SL tablet [id number] Yes Place 1 tablet (0.4 mg total) under the tongue every 5 (five) minutes as needed for chest pain. [place], [place], FNP Taking Active Self   Omega-3 Fatty Acids (FISH OIL) 1200 MG CPDR [id number] Yes Take 1,200 mg by mouth in the morning. [provider] Taking Active Self  omeprazole (PRILOSEC) 40 MG capsule [id number] Yes Take 1 capsule (40 mg total) by mouth at bedtime. Lee, [place], NP Taking Active   ondansetron (ZOFRAN-ODT) 4 MG disintegrating tablet [id number] Yes 4 mg every 8 (eight) hours as needed for nausea. [provider] Taking Active   oseltamivir (TAMIFLU) 30 MG capsule [id number] Yes Take 30 mg by mouth daily. [provider] Taking Active   potassium chloride SA (KLOR-CON M20) 20 MEQ tablet [id number] Yes Take 1 tablet (20 mEq total) by mouth once a week. Take 1 tablet (20 mEq total) with Metolazone dose. Laurey Morale, MD Taking Active   simvastatin (ZOCOR) 20 MG tablet [id number] Yes take 1 tablet every day at 6 in the evening Laurey Morale, MD Taking Active   torsemide (DEMADEX) 20 MG tablet [id number] Yes Take 4 tablets (80 mg total) by mouth daily. Laurey Morale, MD Taking Active   warfarin (COUMADIN) 2 MG tablet [id number] Yes Take 1 tablet by mouth daily except 2 tablets on Tuesday, Thursday, & Saturday or as directed by Anticoagulation Clinic. Jonelle Sidle, MD Taking Active             Home Care and Equipment/Supplies: Were Home Health Services Ordered?: No Any new equipment or medical supplies ordered?: No  Functional Questionnaire: Do you need assistance with bathing/showering or dressing?: No Do you need assistance with meal preparation?: No Do you need assistance with eating?: No Do you have difficulty maintaining continence: No Do you need assistance with getting out of bed/getting out of a chair/moving?: No Do you have difficulty managing or taking your medications?: No  Follow up appointments reviewed: PCP Follow-up appointment confirmed?: No (Patient prefers to call) MD Provider Line Number:[phone] Given: No Specialist Hospital Follow-up appointment  confirmed?: No Reason Specialist Follow-Up Not Confirmed: Patient has Specialist Provider Number and will Call for Appointment Do you need transportation to your follow-up appointment?: No Do you understand care options if your condition(s) worsen?: Yes-patient verbalized understanding  SDOH Interventions Today    Flowsheet Row Most Recent Value  SDOH Interventions   Food Insecurity Interventions Intervention Not Indicated  Housing Interventions Intervention Not Indicated  Transportation Interventions Intervention Not Indicated  [HTA transport services]  Utilities Interventions Intervention Not Indicated      Jodelle Gross RN, BSN, CCM [place]  [facility] Manager Population Health Direct Dial: [phone]  Fax: [phone]

## 2023-04-04 ENCOUNTER — Telehealth: Payer: Self-pay | Admitting: Internal Medicine

## 2023-04-04 NOTE — Telephone Encounter (Signed)
 Patient is calling stating Dr. Orvilla office did not inform her of her INR taken on 01/26.  Due to this she was unsure when her next needed to be scheduled.   An appt was setup for 02/25, and I advised her I will send a message to get a callback to reschedule if it needs to be sooner.   Please advise.

## 2023-04-05 NOTE — Telephone Encounter (Signed)
 Called and spoke to pt.   Inr was 2.3 on 03/26/2023. [name] INR goal 2-3.   Pt stated she has not had any changes in medications or diet.   Confirmed appointment for 04/25/2023.

## 2023-04-07 ENCOUNTER — Other Ambulatory Visit: Payer: Self-pay | Admitting: Family Medicine

## 2023-04-07 ENCOUNTER — Other Ambulatory Visit: Payer: Self-pay | Admitting: Cardiology

## 2023-04-09 ENCOUNTER — Other Ambulatory Visit: Payer: Self-pay | Admitting: Cardiology

## 2023-04-09 ENCOUNTER — Other Ambulatory Visit: Payer: Self-pay | Admitting: Family Medicine

## 2023-04-09 DIAGNOSIS — Z5181 Encounter for therapeutic drug level monitoring: Secondary | ICD-10-CM

## 2023-04-09 DIAGNOSIS — I4821 Permanent atrial fibrillation: Secondary | ICD-10-CM

## 2023-04-10 ENCOUNTER — Other Ambulatory Visit (HOSPITAL_COMMUNITY): Payer: Self-pay | Admitting: Cardiology

## 2023-04-10 MED ORDER — SIMVASTATIN 20 MG PO TABS: 20.0000 mg | ORAL_TABLET | Freq: Every day | ORAL | 3 refills | Status: DC

## 2023-04-10 NOTE — Telephone Encounter (Signed)
 Prescription refill request received for warfarin Lov: 03/13/23 (CHF)  Next INR check: 03/14/23 Warfarin tablet strength: 2mg   Anticoagulation appt overdue. Pt has scheduled appt on 04/25/23. 30 day supply sent to pharmacy to prevent any missed doses.

## 2023-04-17 ENCOUNTER — Other Ambulatory Visit (HOSPITAL_COMMUNITY): Payer: Self-pay

## 2023-04-17 ENCOUNTER — Ambulatory Visit: Payer: PPO | Attending: Internal Medicine

## 2023-04-17 DIAGNOSIS — Z95810 Presence of automatic (implantable) cardiac defibrillator: Secondary | ICD-10-CM

## 2023-04-17 DIAGNOSIS — I5022 Chronic systolic (congestive) heart failure: Secondary | ICD-10-CM | POA: Diagnosis not present

## 2023-04-20 NOTE — Progress Notes (Signed)
EPIC Encounter for ICM Monitoring  Patient Name: Emily [name] is a 73 y.o. female Date: 04/20/2023 Primary Care Physican: Glendale Chard, DO Primary Cardiologist: [facility] Electrophysiologist: Ladona Ridgel Nephrologist: [facility]-Dr Lacy Duverney  Bi-V Pacing: 96.1%  06/08/2022 Weight: 122-123 lbs 09/20/2022 Weight: 119-121 lbs 10/03/2022 Weight: 120 lbs 12/07/2022 Weight: 123-124 lbs    Spoke with patient and heart failure questions reviewed.  Transmission results reviewed.  Pt asymptomatic for fluid accumulation.  She is feeling tired and weak.   She has a lot of dizziness.          Optivol Thoracic impedance suggesting normal fluid levels within the last month.   Prescribed: Furosemide 80 mg Take 1 tablet (80 mg total) by mouth daily     Metolazone 2.5 mg take 1 tablet by mouth weekly.  Take 20 mEq potassium with weekly dose.  Potassium 20 mEq take 1 tablet by mouth with weekly Metolazone dosage   Labs: 03/26/2023 Creatinine 3.8,   BUN 84, Potassium 5.1, Sodium 128, GFR 12 Care Everywhere 03/25/2023 Creatinine 3.34, BUN 82, Potassium 5.5, Sodium 128, GFR 14 Care Everywhere 03/24/2023 Creatinine 3.26, BUN 86, Potassium 2.6, Sodium 139, GFR 14 Care Everywhere 03/13/2023 Creatinine 2.83, BUN 87, Potassium 4.0, Sodium 132, GFR 17 03/02/2023 Creatinine 3.16, BUN 76, Potassium 4.4, Sodium 133  A complete set of results can be found in Results Review.   Recommendations:  No changes and encouraged to call if experiencing any fluid symptoms.   Follow-up plan: ICM clinic phone appointment on 05/22/2023.  91 day device clinic remote transmission 06/13/2023.      EP/Cardiology Office Visits:  05/08/2023 with Dr Ladona Ridgel.  05/25/2023 with Dr Shirlee Latch.       Copy of ICM check sent to Dr. Ladona Ridgel.  3 month ICM trend: 04/17/2023.    12-14 Month ICM trend:     Karie Soda, RN 04/20/2023 10:45 AM

## 2023-04-24 DIAGNOSIS — N184 Chronic kidney disease, stage 4 (severe): Secondary | ICD-10-CM | POA: Diagnosis not present

## 2023-04-24 DIAGNOSIS — I428 Other cardiomyopathies: Secondary | ICD-10-CM | POA: Diagnosis not present

## 2023-04-24 DIAGNOSIS — I129 Hypertensive chronic kidney disease with stage 1 through stage 4 chronic kidney disease, or unspecified chronic kidney disease: Secondary | ICD-10-CM | POA: Diagnosis not present

## 2023-04-24 DIAGNOSIS — E1122 Type 2 diabetes mellitus with diabetic chronic kidney disease: Secondary | ICD-10-CM | POA: Diagnosis not present

## 2023-04-25 ENCOUNTER — Ambulatory Visit: Payer: PPO

## 2023-04-25 LAB — LAB REPORT - SCANNED: EGFR: 11

## 2023-04-25 NOTE — Progress Notes (Signed)
 Remote ICD transmission.

## 2023-04-26 ENCOUNTER — Inpatient Hospital Stay (HOSPITAL_COMMUNITY): Payer: PPO

## 2023-04-26 ENCOUNTER — Emergency Department (HOSPITAL_COMMUNITY): Payer: PPO

## 2023-04-26 ENCOUNTER — Other Ambulatory Visit: Payer: Self-pay

## 2023-04-26 ENCOUNTER — Inpatient Hospital Stay (HOSPITAL_COMMUNITY)
Admission: EM | Admit: 2023-04-26 | Discharge: 2023-04-29 | DRG: 682 | Disposition: A | Discharge status: Home / Self Care | Payer: PPO | Attending: Internal Medicine | Admitting: Internal Medicine

## 2023-04-26 ENCOUNTER — Encounter (HOSPITAL_COMMUNITY): Payer: Self-pay

## 2023-04-26 DIAGNOSIS — Z91041 Radiographic dye allergy status: Secondary | ICD-10-CM

## 2023-04-26 DIAGNOSIS — Q211 Atrial septal defect, unspecified: Secondary | ICD-10-CM | POA: Diagnosis not present

## 2023-04-26 DIAGNOSIS — I639 Cerebral infarction, unspecified: Secondary | ICD-10-CM | POA: Diagnosis not present

## 2023-04-26 DIAGNOSIS — N179 Acute kidney failure, unspecified: Principal | ICD-10-CM | POA: Diagnosis present

## 2023-04-26 DIAGNOSIS — Z5986 Financial insecurity: Secondary | ICD-10-CM

## 2023-04-26 DIAGNOSIS — G2581 Restless legs syndrome: Secondary | ICD-10-CM | POA: Diagnosis present

## 2023-04-26 DIAGNOSIS — E861 Hypovolemia: Secondary | ICD-10-CM | POA: Diagnosis not present

## 2023-04-26 DIAGNOSIS — I130 Hypertensive heart and chronic kidney disease with heart failure and stage 1 through stage 4 chronic kidney disease, or unspecified chronic kidney disease: Secondary | ICD-10-CM | POA: Diagnosis not present

## 2023-04-26 DIAGNOSIS — E11649 Type 2 diabetes mellitus with hypoglycemia without coma: Secondary | ICD-10-CM | POA: Diagnosis not present

## 2023-04-26 DIAGNOSIS — I350 Nonrheumatic aortic (valve) stenosis: Secondary | ICD-10-CM | POA: Diagnosis not present

## 2023-04-26 DIAGNOSIS — N184 Chronic kidney disease, stage 4 (severe): Secondary | ICD-10-CM | POA: Diagnosis not present

## 2023-04-26 DIAGNOSIS — Z825 Family history of asthma and other chronic lower respiratory diseases: Secondary | ICD-10-CM

## 2023-04-26 DIAGNOSIS — K219 Gastro-esophageal reflux disease without esophagitis: Secondary | ICD-10-CM | POA: Diagnosis present

## 2023-04-26 DIAGNOSIS — Z79899 Other long term (current) drug therapy: Secondary | ICD-10-CM

## 2023-04-26 DIAGNOSIS — I472 Ventricular tachycardia, unspecified: Secondary | ICD-10-CM | POA: Diagnosis present

## 2023-04-26 DIAGNOSIS — Z885 Allergy status to narcotic agent status: Secondary | ICD-10-CM

## 2023-04-26 DIAGNOSIS — I5022 Chronic systolic (congestive) heart failure: Secondary | ICD-10-CM | POA: Insufficient documentation

## 2023-04-26 DIAGNOSIS — M545 Low back pain, unspecified: Secondary | ICD-10-CM | POA: Diagnosis not present

## 2023-04-26 DIAGNOSIS — Z66 Do not resuscitate: Secondary | ICD-10-CM | POA: Diagnosis not present

## 2023-04-26 DIAGNOSIS — Z794 Long term (current) use of insulin: Secondary | ICD-10-CM | POA: Diagnosis not present

## 2023-04-26 DIAGNOSIS — I340 Nonrheumatic mitral (valve) insufficiency: Secondary | ICD-10-CM | POA: Diagnosis not present

## 2023-04-26 DIAGNOSIS — M47816 Spondylosis without myelopathy or radiculopathy, lumbar region: Secondary | ICD-10-CM | POA: Diagnosis not present

## 2023-04-26 DIAGNOSIS — E871 Hypo-osmolality and hyponatremia: Secondary | ICD-10-CM | POA: Diagnosis present

## 2023-04-26 DIAGNOSIS — S0990XA Unspecified injury of head, initial encounter: Secondary | ICD-10-CM | POA: Diagnosis not present

## 2023-04-26 DIAGNOSIS — R791 Abnormal coagulation profile: Secondary | ICD-10-CM | POA: Diagnosis present

## 2023-04-26 DIAGNOSIS — Z7984 Long term (current) use of oral hypoglycemic drugs: Secondary | ICD-10-CM

## 2023-04-26 DIAGNOSIS — R109 Unspecified abdominal pain: Secondary | ICD-10-CM | POA: Diagnosis not present

## 2023-04-26 DIAGNOSIS — D631 Anemia in chronic kidney disease: Secondary | ICD-10-CM | POA: Diagnosis present

## 2023-04-26 DIAGNOSIS — J45909 Unspecified asthma, uncomplicated: Secondary | ICD-10-CM | POA: Diagnosis not present

## 2023-04-26 DIAGNOSIS — I080 Rheumatic disorders of both mitral and aortic valves: Secondary | ICD-10-CM | POA: Diagnosis not present

## 2023-04-26 DIAGNOSIS — R54 Age-related physical debility: Secondary | ICD-10-CM | POA: Diagnosis present

## 2023-04-26 DIAGNOSIS — R634 Abnormal weight loss: Secondary | ICD-10-CM | POA: Diagnosis not present

## 2023-04-26 DIAGNOSIS — E1165 Type 2 diabetes mellitus with hyperglycemia: Secondary | ICD-10-CM | POA: Diagnosis not present

## 2023-04-26 DIAGNOSIS — I6389 Other cerebral infarction: Secondary | ICD-10-CM | POA: Diagnosis not present

## 2023-04-26 DIAGNOSIS — R0602 Shortness of breath: Secondary | ICD-10-CM | POA: Diagnosis not present

## 2023-04-26 DIAGNOSIS — E1122 Type 2 diabetes mellitus with diabetic chronic kidney disease: Secondary | ICD-10-CM | POA: Diagnosis not present

## 2023-04-26 DIAGNOSIS — E872 Acidosis, unspecified: Secondary | ICD-10-CM | POA: Diagnosis not present

## 2023-04-26 DIAGNOSIS — M160 Bilateral primary osteoarthritis of hip: Secondary | ICD-10-CM | POA: Diagnosis not present

## 2023-04-26 DIAGNOSIS — I5020 Unspecified systolic (congestive) heart failure: Secondary | ICD-10-CM | POA: Diagnosis not present

## 2023-04-26 DIAGNOSIS — E785 Hyperlipidemia, unspecified: Secondary | ICD-10-CM | POA: Diagnosis present

## 2023-04-26 DIAGNOSIS — Z79890 Hormone replacement therapy (postmenopausal): Secondary | ICD-10-CM

## 2023-04-26 DIAGNOSIS — I493 Ventricular premature depolarization: Secondary | ICD-10-CM | POA: Diagnosis present

## 2023-04-26 DIAGNOSIS — I428 Other cardiomyopathies: Secondary | ICD-10-CM | POA: Diagnosis not present

## 2023-04-26 DIAGNOSIS — Z888 Allergy status to other drugs, medicaments and biological substances status: Secondary | ICD-10-CM

## 2023-04-26 DIAGNOSIS — I480 Paroxysmal atrial fibrillation: Secondary | ICD-10-CM | POA: Diagnosis not present

## 2023-04-26 DIAGNOSIS — I272 Pulmonary hypertension, unspecified: Secondary | ICD-10-CM | POA: Diagnosis present

## 2023-04-26 DIAGNOSIS — Z95810 Presence of automatic (implantable) cardiac defibrillator: Secondary | ICD-10-CM | POA: Diagnosis present

## 2023-04-26 DIAGNOSIS — Z8249 Family history of ischemic heart disease and other diseases of the circulatory system: Secondary | ICD-10-CM

## 2023-04-26 DIAGNOSIS — E876 Hypokalemia: Secondary | ICD-10-CM | POA: Diagnosis present

## 2023-04-26 DIAGNOSIS — Z7901 Long term (current) use of anticoagulants: Secondary | ICD-10-CM

## 2023-04-26 DIAGNOSIS — R531 Weakness: Secondary | ICD-10-CM | POA: Diagnosis not present

## 2023-04-26 DIAGNOSIS — I4821 Permanent atrial fibrillation: Secondary | ICD-10-CM | POA: Diagnosis present

## 2023-04-26 DIAGNOSIS — R55 Syncope and collapse: Secondary | ICD-10-CM | POA: Diagnosis not present

## 2023-04-26 DIAGNOSIS — Z7189 Other specified counseling: Secondary | ICD-10-CM | POA: Diagnosis not present

## 2023-04-26 DIAGNOSIS — Z90710 Acquired absence of both cervix and uterus: Secondary | ICD-10-CM

## 2023-04-26 DIAGNOSIS — Z9109 Other allergy status, other than to drugs and biological substances: Secondary | ICD-10-CM

## 2023-04-26 DIAGNOSIS — R42 Dizziness and giddiness: Secondary | ICD-10-CM | POA: Diagnosis not present

## 2023-04-26 DIAGNOSIS — E890 Postprocedural hypothyroidism: Secondary | ICD-10-CM | POA: Diagnosis not present

## 2023-04-26 DIAGNOSIS — Z515 Encounter for palliative care: Secondary | ICD-10-CM | POA: Diagnosis not present

## 2023-04-26 DIAGNOSIS — R296 Repeated falls: Secondary | ICD-10-CM | POA: Diagnosis present

## 2023-04-26 DIAGNOSIS — Z6822 Body mass index (BMI) 22.0-22.9, adult: Secondary | ICD-10-CM

## 2023-04-26 DIAGNOSIS — I959 Hypotension, unspecified: Secondary | ICD-10-CM | POA: Diagnosis present

## 2023-04-26 DIAGNOSIS — I6782 Cerebral ischemia: Secondary | ICD-10-CM | POA: Diagnosis not present

## 2023-04-26 DIAGNOSIS — D649 Anemia, unspecified: Secondary | ICD-10-CM | POA: Diagnosis not present

## 2023-04-26 DIAGNOSIS — I2510 Atherosclerotic heart disease of native coronary artery without angina pectoris: Secondary | ICD-10-CM | POA: Diagnosis present

## 2023-04-26 DIAGNOSIS — Z5982 Transportation insecurity: Secondary | ICD-10-CM

## 2023-04-26 LAB — URINALYSIS, ROUTINE W REFLEX MICROSCOPIC
Bilirubin Urine: NEGATIVE
Glucose, UA: 150 mg/dL — AB
Hgb urine dipstick: NEGATIVE
Ketones, ur: NEGATIVE mg/dL
Nitrite: NEGATIVE
Protein, ur: NEGATIVE mg/dL
Specific Gravity, Urine: 1.013 (ref 1.005–1.030)
pH: 5 (ref 5.0–8.0)

## 2023-04-26 LAB — RENAL FUNCTION PANEL
Albumin: 3.6 g/dL (ref 3.5–5.0)
Anion gap: 19 — ABNORMAL HIGH (ref 5–15)
BUN: 142 mg/dL — ABNORMAL HIGH (ref 8–23)
CO2: 22 mmol/L (ref 22–32)
Calcium: 7.6 mg/dL — ABNORMAL LOW (ref 8.9–10.3)
Chloride: 81 mmol/L — ABNORMAL LOW (ref 98–111)
Creatinine, Ser: 4.78 mg/dL — ABNORMAL HIGH (ref 0.44–1.00)
GFR, Estimated: 9 mL/min — ABNORMAL LOW (ref >60)
Glucose, Bld: 278 mg/dL — ABNORMAL HIGH (ref 70–99)
Phosphorus: 6.8 mg/dL — ABNORMAL HIGH (ref 2.5–4.6)
Potassium: 3.1 mmol/L — ABNORMAL LOW (ref 3.5–5.1)
Sodium: 122 mmol/L — ABNORMAL LOW (ref 135–145)

## 2023-04-26 LAB — CBC WITH DIFFERENTIAL/PLATELET
Abs Immature Granulocytes: 0.07 10*3/uL (ref 0.00–0.07)
Basophils Absolute: 0 10*3/uL (ref 0.0–0.1)
Basophils Relative: 0 %
Eosinophils Absolute: 0.1 10*3/uL (ref 0.0–0.5)
Eosinophils Relative: 1 %
HCT: 32.7 % — ABNORMAL LOW (ref 36.0–46.0)
Hemoglobin: 11 g/dL — ABNORMAL LOW (ref 12.0–15.0)
Immature Granulocytes: 1 %
Lymphocytes Relative: 12 %
Lymphs Abs: 1 10*3/uL (ref 0.7–4.0)
MCH: 26.9 pg (ref 26.0–34.0)
MCHC: 33.6 g/dL (ref 30.0–36.0)
MCV: 80 fL (ref 80.0–100.0)
Monocytes Absolute: 0.7 10*3/uL (ref 0.1–1.0)
Monocytes Relative: 8 %
Neutro Abs: 7 10*3/uL (ref 1.7–7.7)
Neutrophils Relative %: 78 %
Platelets: 309 10*3/uL (ref 150–400)
RBC: 4.09 MIL/uL (ref 3.87–5.11)
RDW: 18 % — ABNORMAL HIGH (ref 11.5–15.5)
WBC: 9 10*3/uL (ref 4.0–10.5)
nRBC: 0 % (ref 0.0–0.2)

## 2023-04-26 LAB — COMPREHENSIVE METABOLIC PANEL
ALT: 14 U/L (ref 0–44)
AST: 24 U/L (ref 15–41)
Albumin: 3.8 g/dL (ref 3.5–5.0)
Alkaline Phosphatase: 91 U/L (ref 38–126)
Anion gap: 24 — ABNORMAL HIGH (ref 5–15)
BUN: 147 mg/dL — ABNORMAL HIGH (ref 8–23)
CO2: 18 mmol/L — ABNORMAL LOW (ref 22–32)
Calcium: 7.8 mg/dL — ABNORMAL LOW (ref 8.9–10.3)
Chloride: 74 mmol/L — ABNORMAL LOW (ref 98–111)
Creatinine, Ser: 4.95 mg/dL — ABNORMAL HIGH (ref 0.44–1.00)
GFR, Estimated: 9 mL/min — ABNORMAL LOW (ref >60)
Glucose, Bld: 389 mg/dL — ABNORMAL HIGH (ref 70–99)
Potassium: 3.4 mmol/L — ABNORMAL LOW (ref 3.5–5.1)
Sodium: 116 mmol/L — CL (ref 135–145)
Total Bilirubin: 1.9 mg/dL — ABNORMAL HIGH (ref 0.0–1.2)
Total Protein: 7.5 g/dL (ref 6.5–8.1)

## 2023-04-26 LAB — TSH: TSH: 0.505 u[IU]/mL (ref 0.350–4.500)

## 2023-04-26 LAB — GLUCOSE, CAPILLARY: Glucose-Capillary: 267 mg/dL — ABNORMAL HIGH (ref 70–99)

## 2023-04-26 LAB — CBG MONITORING, ED
Glucose-Capillary: 381 mg/dL — ABNORMAL HIGH (ref 70–99)
Glucose-Capillary: 288 mg/dL — ABNORMAL HIGH (ref 70–99)

## 2023-04-26 LAB — MAGNESIUM: Magnesium: 2.3 mg/dL (ref 1.7–2.4)

## 2023-04-26 LAB — OSMOLALITY: Osmolality: 328 mosm/kg (ref 275–295)

## 2023-04-26 LAB — PROTIME-INR
INR: 5.5 (ref 0.8–1.2)
Prothrombin Time: 49.9 s — ABNORMAL HIGH (ref 11.4–15.2)

## 2023-04-26 MED ORDER — ACETAMINOPHEN 325 MG PO TABS
650.0000 mg | ORAL_TABLET | Freq: Four times a day (QID) | ORAL | Status: DC | PRN
  Administered 2023-04-27 – 2023-04-29 (×4): 650 mg via ORAL
  Filled 2023-04-26 (×4): qty 2

## 2023-04-26 MED ORDER — INSULIN ASPART 100 UNIT/ML IJ SOLN
0.0000 [IU] | Freq: Three times a day (TID) | INTRAMUSCULAR | Status: DC
  Administered 2023-04-26: 5 [IU] via SUBCUTANEOUS
  Administered 2023-04-27 (×2): 2 [IU] via SUBCUTANEOUS
  Administered 2023-04-27 – 2023-04-28 (×2): 5 [IU] via SUBCUTANEOUS
  Administered 2023-04-28: 2 [IU] via SUBCUTANEOUS
  Administered 2023-04-28 – 2023-04-29 (×2): 3 [IU] via SUBCUTANEOUS
  Administered 2023-04-29: 1 [IU] via SUBCUTANEOUS

## 2023-04-26 MED ORDER — SODIUM CHLORIDE 0.9 % IV SOLN
510.0000 mg | Freq: Once | INTRAVENOUS | Status: AC
  Administered 2023-04-26: 510 mg via INTRAVENOUS
  Filled 2023-04-26: qty 17

## 2023-04-26 MED ORDER — ONDANSETRON HCL 4 MG PO TABS: 4.0000 mg | ORAL_TABLET | Freq: Four times a day (QID) | ORAL | Status: DC | PRN

## 2023-04-26 MED ORDER — ONDANSETRON HCL 4 MG/2ML IJ SOLN: 4.0000 mg | Freq: Four times a day (QID) | INTRAMUSCULAR | Status: DC | PRN

## 2023-04-26 MED ORDER — WARFARIN - PHARMACIST DOSING INPATIENT: Freq: Every day | Status: DC

## 2023-04-26 MED ORDER — LACTATED RINGERS IV SOLN: INTRAVENOUS | Status: DC

## 2023-04-26 MED ORDER — ONDANSETRON HCL 4 MG/2ML IJ SOLN
4.0000 mg | Freq: Once | INTRAMUSCULAR | Status: AC
  Administered 2023-04-26: 4 mg via INTRAVENOUS
  Filled 2023-04-26: qty 2

## 2023-04-26 MED ORDER — LEVOTHYROXINE SODIUM 88 MCG PO TABS
88.0000 ug | ORAL_TABLET | Freq: Every day | ORAL | Status: DC
  Administered 2023-04-27 – 2023-04-29 (×3): 88 ug via ORAL
  Filled 2023-04-26 (×3): qty 1

## 2023-04-26 MED ORDER — INSULIN ASPART 100 UNIT/ML IJ SOLN
0.0000 [IU] | Freq: Every day | INTRAMUSCULAR | Status: DC
  Administered 2023-04-26: 3 [IU] via SUBCUTANEOUS
  Administered 2023-04-27 – 2023-04-28 (×2): 2 [IU] via SUBCUTANEOUS

## 2023-04-26 MED ORDER — SODIUM CHLORIDE 0.9 % IV BOLUS
1000.0000 mL | Freq: Once | INTRAVENOUS | Status: AC
  Administered 2023-04-26: 1000 mL via INTRAVENOUS

## 2023-04-26 MED ORDER — INSULIN GLARGINE 100 UNIT/ML ~~LOC~~ SOLN
8.0000 [IU] | Freq: Every day | SUBCUTANEOUS | Status: DC
  Administered 2023-04-26 – 2023-04-28 (×3): 8 [IU] via SUBCUTANEOUS
  Filled 2023-04-26 (×4): qty 0.08

## 2023-04-26 MED ORDER — ACETAMINOPHEN 650 MG RE SUPP: 650.0000 mg | Freq: Four times a day (QID) | RECTAL | Status: DC | PRN

## 2023-04-26 NOTE — ED Notes (Signed)
 Patient transported to X-ray

## 2023-04-26 NOTE — Consult Note (Addendum)
 Nephrology Consult    Reason for consult: AKI on CKD  Assessment/Recommendations:   AKI on CKD 4 -followed at our [place] satellite office. Likely prerenal injury in the context of profound hyperglycemia, low PO intake. -UA pending, CT abd/pel pending -continue to hold home diuretics -fluids: clinically looks dry, will start gentle LR @ 50cc/hr. Recommend having a low threshold to stop fluids if any concerns for hypervolemia -renal replacement therapy indications: no urgent indications but this may very well change. I am concerned about doing hemodialysis given her functional status and underlying HFrEF. She may be a candidate for PD in the long run but again, her functional status may be challenging for this. Will tentatively keep her NPO after midnight and will consult palliative care in the interim to help with GOC discussions -Avoid nephrotoxic medications including NSAIDs and iodinated intravenous contrast exposure unless the latter is absolutely indicated.  Preferred narcotic agents for pain control are hydromorphone, fentanyl, and methadone. Morphine should not be used. Avoid Baclofen and avoid oral sodium phosphate and magnesium citrate based laxatives / bowel preps. Continue strict Input and Output monitoring. Will monitor the patient closely with you and intervene or adjust therapy as indicated by changes in clinical status/labs   Severe Hyponatremia -suspecting this is hypovolemic in nature given exam and history -corrected Na for hyperglycemia is around 121 -repeat met panel now given that she is s/p 1L of isotonic fluids -checking urine na, urine osm, serum osm  Acidosis -secondary to AKI, monitor for now, supplement once bicarb <16  HFrEF -clinically looks dry on exam -diuretics on hold as above  Anemia of CKD -hgb currently acceptable, watch for now  Uncontrolled Diabetes Mellitus Type 2 with Hyperglycemia -per primary   Recommendations conveyed to primary  service.    Anthony Sar [facility] 04/26/2023 3:31 PM   _____________________________________________________________________________________   History of Present Illness: Emily [name] is a/an 73 y.o. female with a past medical history of CKD 4-5, permanent A-fib, nonischemic cardiomyopathy/HFrEF, DM2, aortic stenosis, ASD, MR (moderate to severe) who presents to [facility] with abnormal labs, presyncope/syncope, nausea/dry heaves/decreased p.o. intake.  She was last seen at her satellite office in August on 2/24 with Dr. Marisue Humble.  Labs confirm worsening kidney function with a creatinine of 4.1, BUN 111, K3.4, sodium 118, hemoglobin 10.9.  Therefore, was directed to come here.  Her metolazone, furosemide, Jardiance were preemptively held at that time. She has fallen several times recently. Found to have a Na of 116, K 3.4, bicarb 18, gluc 389, Cr 4.95 in the ER. CXR without acute/active process. Received 1L NS. Patient seen and examined in the ER. Daughter at bedside. Patient reports that her symptoms have been occurring for the last month. They have yet to discuss amongst themselves in regards to whether or not she would do dialysis. She reports that her sugars were controlled at home. Denies any diminished urinary frequency, dysuria, hematuria, CP, SOB, swelling.   Medications:  No current facility-administered medications for this encounter.   Current Outpatient Medications  Medication Sig Dispense Refill   acetaminophen (TYLENOL) 500 MG tablet Take 500 mg by mouth at bedtime as needed for mild pain.     allopurinol (ZYLOPRIM) 100 MG tablet take 2 tablets in the morning and 1 tablet in the evening. 100 tablet 11   Biotin 1000 MCG tablet Take 1,000 mcg by mouth daily.     Calcium Carb-Cholecalciferol (CALCIUM + VITAMIN D3 PO) Take 1 tablet by mouth daily.  empagliflozin (JARDIANCE) 10 MG TABS tablet Take 1 tablet (10 mg total) by mouth daily before breakfast. 30 tablet  11   eplerenone (INSPRA) 25 MG tablet Take 1 tablet (25 mg total) by mouth daily. 90 tablet 3   Glucosamine-Chondroitin (OSTEO BI-FLEX REGULAR STRENGTH PO) Take 1 tablet by mouth in the morning.     insulin glargine, 2 Unit Dial, (TOUJEO MAX SOLOSTAR) 300 UNIT/ML Solostar Pen Inject 24 Units into the skin at bedtime. 9 mL 2   insulin lispro (HUMALOG) 100 UNIT/ML injection Inject 4-8 Units into the skin daily as needed for high blood sugar. Sliding scale Less than 150 =0 150-200=4 Units 201-250=5 Units 251-300=6 Units 301-350=7 Units 351-400=8 Units     Insulin Pen Needle (GLOBAL EASE INJECT PEN NEEDLES) 31G X 5 MM MISC use up to 4 times a day as directed. 90 each 3   INSULIN SYRINGE .5CC/29G 29G X 1/2" 0.5 ML MISC Use to give yourself daily basal insulin (Lantus or Tuojeo). 100 each 6   levothyroxine (SYNTHROID) 88 MCG tablet Take 1 tablet (88 mcg total) by mouth daily before breakfast. 90 tablet 3   losartan (COZAAR) 25 MG tablet Take 0.5 tablets (12.5 mg total) by mouth daily. (Patient not taking: Reported on 03/28/2023) 15 tablet 11   metolazone (ZAROXOLYN) 2.5 MG tablet Take 1 tablet (2.5 mg total) by mouth once a week. Every Tuesday  Take 1 Potassium 20 mEq tablet on Metolazone days. 15 tablet 3   metoprolol succinate (TOPROL-XL) 100 MG 24 hr tablet Take 1 tablet (100 mg total) by mouth at bedtime. Take with or immediately following a meal. 30 tablet 6   nitroGLYCERIN (NITROSTAT) 0.4 MG SL tablet Place 1 tablet (0.4 mg total) under the tongue every 5 (five) minutes as needed for chest pain. 90 tablet 3   Omega-3 Fatty Acids (FISH OIL) 1200 MG CPDR Take 1,200 mg by mouth in the morning.     omeprazole (PRILOSEC) 40 MG capsule Take 1 capsule (40 mg total) by mouth at bedtime. 90 capsule 0   potassium chloride SA (KLOR-CON M20) 20 MEQ tablet Take 1 tablet (20 mEq total) by mouth once a week. Take 1 tablet (20 mEq total) with Metolazone dose. 15 tablet 3   simvastatin (ZOCOR) 20 MG tablet Take 1  tablet (20 mg total) by mouth daily at 6 PM. 90 tablet 3   torsemide (DEMADEX) 20 MG tablet Take 4 tablets (80 mg total) by mouth daily. 200 tablet 3   warfarin (COUMADIN) 2 MG tablet take 1 tablet by mouth daily except 2 tablets on tuesday, thursday, & saturday or as directed by anticoagulation clinic. 40 tablet 0     ALLERGIES Amiodarone, Other, Codeine, Turmeric, Colchicine, Iodine, Ivp dye [iodinated contrast media], and Mexitil [mexiletine]  MEDICAL HISTORY Past Medical History:  Diagnosis Date   Acute pain of right shoulder 03/22/2019   Acute renal failure (HCC) 04/15/2012   Last Assessment & Plan:  Superimposed on CK D stage III, Baseline Cr ~2-2.5, remains above baseline, felt to be due to Cardiorenal syndrome, creatinine trending down at time of discharge Negative urine eos and no urinary retention ACE inhibitor discontinued Follows with Dr Julien Girt as outpatient, to have labs drawn 08/15/16 and results sent to SENephrology office for review   AICD (automatic cardioverter/defibrillator) present    Anemia 12/29/2016   Aortic stenosis    Arthritis    hands   ASD (atrial septal defect)    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  Asthma    as a child   Atrial fibrillation with RVR (HCC)    Biventricular ICD (implantable cardioverter-defibrillator) in place    CHF (congestive heart failure) (HCC)    CKD (chronic kidney disease), stage IV (HCC)    Colitis 09/11/2012   Complete heart block (HCC)    Dyspnea    Essential hypertension    GERD (gastroesophageal reflux disease)    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 Physical exam revealed cerumen impaction of left ear canal. Relieved by flushing.    Hypomagnesemia 04/10/2016   Hyponatremia 04/10/2016   Last  Assessment & Plan:  - improved after tolvaptan 6/29, chiefly driven by CHF, further improvement with ongoing IV Lasix - continue low Na and fluid restriction - compliance with sodium restriction a major barrier to keeping patient out of hospital and compensated, remains resistant to this philosophy while inpatient - follows with Dr Julien Girt as outpatient, Neph consult not indicated at this ti   Hypothyroidism    ICD (implantable cardioverter-defibrillator) battery depletion 04/21/2014   Localized macular rash 08/11/2016   Last Assessment & Plan:  Seems to trend with heart failure, though she has more findings behind bilateral knees and up into her thigh/groin area than when I last saw her Would use steroid cream for now, consider alternative diagnosis if no improvement   Long-term insulin use in type 2 diabetes (HCC) 04/10/2016   Last Assessment & Plan:  Hypoglycemia resolved, continue SSI and hypoglycemia protocol   Mitral regurgitation    Nonischemic cardiomyopathy (HCC)    On amiodarone therapy 08/21/2017   Persistent atrial fibrillation (HCC)    AV node ablation 2019   Pneumonia    PONV (postoperative nausea and vomiting)    has awakened during surgery   Restless leg syndrome 03/22/2019   Type 2 diabetes mellitus (HCC)      SOCIAL HISTORY Social History   Socioeconomic History   Marital status: Married    Spouse name: Not on file   Number of children: Not on file   Years of education: Not on file   Highest education level: Not on file  Occupational History   Not on file  Tobacco Use   Smoking status: Never    Passive exposure: Never   Smokeless tobacco: Never  Vaping Use   Vaping status: Never Used  Substance and Sexual Activity   Alcohol use: No   Drug use: No   Sexual activity: Not on file  Other Topics Concern   Not on file  Social History Narrative   Not on file   Social Drivers of Health   Financial Resource Strain: Medium Risk (03/25/2023)   Received from [facility]   Overall Financial Resource Strain (CARDIA)    Difficulty of Paying Living Expenses: Somewhat hard  Food Insecurity: No Food Insecurity (03/28/2023)   Hunger Vital Sign    Worried About Running Out of Food in the Last Year: Never true    Ran Out of Food in the Last Year: Never true  Transportation Needs: No Transportation Needs (03/28/2023)   PRAPARE - Administrator, Civil Service (Medical): No    Lack of Transportation (Non-Medical): No  Recent Concern: Transportation Needs - Unmet  Transportation Needs (03/25/2023)   Received from [facility] - Transportation    Lack of Transportation (Medical): Yes    Lack of Transportation (Non-Medical): No  Physical Activity: Insufficiently Active (09/16/2022)   Exercise Vital Sign    Days of Exercise per Week: 3 days    Minutes of Exercise per Session: 10 min  Stress: No Stress Concern Present (09/16/2022)   [employer] of Occupational Health - Occupational Stress Questionnaire    Feeling of Stress : Only a little  Social Connections: Moderately Integrated (09/16/2022)   Social Connection and Isolation Panel [NHANES]    Frequency of Communication with Friends and Family: More than three times a week    Frequency of Social Gatherings with Friends and Family: Three times a week    Attends Religious Services: 1 to 4 times per year    Active Member of Clubs or Organizations: No    Attends Banker Meetings: Never    Marital Status: Married  Catering manager Violence: Not At Risk (03/28/2023)   Humiliation, Afraid, Rape, and Kick questionnaire    Fear of Current or Ex-Partner: No    Emotionally Abused: No    Physically Abused: No    Sexually Abused: No     FAMILY HISTORY Family History  Problem Relation Age of Onset   Hip fracture Mother    COPD Father    CAD Sister    Heart attack Sister      Review of Systems: 12 systems reviewed Otherwise as per HPI, all other systems reviewed and  negative  Physical Exam: Vitals:   04/26/23 1400 04/26/23 1508  BP: 97/85   Pulse:    Resp: 14   Temp:  97.7 F (36.5 C)   No intake/output data recorded. No intake or output data in the 24 hours ending 04/26/23 1531 General: no acute distress, frail appearing HEENT: anicteric sclera, oropharynx clear without lesions, dry MM CV: regular rate, normal rhythm, no murmurs, no gallops, no rubs, no peripheral edema Lungs: clear to auscultation bilaterally, normal work of breathing Abd: soft, non-tender, non-distended Skin: no visible lesions or rashes, dry skin Psych: alert, engaged, appropriate mood and affect Musculoskeletal: no obvious deformities Neuro: normal speech, no gross focal deficits, no asterixis   Test Results Reviewed Lab Results  Component Value Date   NA 116 (LL) 04/26/2023   K 3.4 (L) 04/26/2023   CL 74 (L) 04/26/2023   CO2 18 (L) 04/26/2023   BUN 147 (H) 04/26/2023   CREATININE 4.95 (H) 04/26/2023   CALCIUM 7.8 (L) 04/26/2023   ALBUMIN 3.8 04/26/2023     I have reviewed all relevant outside healthcare records related to the patient's kidney injury.

## 2023-04-26 NOTE — ED Triage Notes (Signed)
 Family reports went to kidney doctor last week and had blood work done.  Family reports MD called today and told them to bring her to the ER because she is in Stage 5 CKD.  +nausea dizziness, back pain due to fall and weight loss and weakness.

## 2023-04-26 NOTE — Progress Notes (Signed)
 PHARMACY - ANTICOAGULATION CONSULT NOTE  Pharmacy Consult for warfarin Indication: atrial fibrillation  Allergies  Allergen Reactions   Amiodarone Rash   Other Palpitations    PT STATES SHE GETS REALLY HOT , DIZZY HEADED, HAS PASSED OUT TWICE , AND ITCHING WITH IV DYE   Codeine Other (See Comments)    Euphoria, hallucinations   Turmeric     Pt went into AFIB    Colchicine Rash   Iodine Rash    topical   Ivp Dye [Iodinated Contrast Media] Rash   Mexitil [Mexiletine] Rash    Patient Measurements: Height: 5\' 2"  (157.5 cm) Weight: 54.9 kg (121 lb) IBW/kg (Calculated) : 50.1  Vital Signs: Temp: 97.7 F (36.5 C) (02/26 1508) Temp Source: Oral (02/26 1508) BP: 97/85 (02/26 1400) Pulse Rate: 79 (02/26 1108)  Labs: Recent Labs    04/26/23 1134 04/26/23 1321  HGB 11.0*  --   HCT 32.7*  --   PLT 309  --   LABPROT  --  49.9*  INR  --  5.5*  CREATININE 4.95*  --     Estimated Creatinine Clearance: 7.9 mL/min (A) (by C-G formula based on SCr of 4.95 mg/dL (H)).   Medical History: Past Medical History:  Diagnosis Date   Acute pain of right shoulder 03/22/2019   Acute renal failure (HCC) 04/15/2012   Last Assessment & Plan:  Superimposed on CK D stage III, Baseline Cr ~2-2.5, remains above baseline, felt to be due to Cardiorenal syndrome, creatinine trending down at time of discharge Negative urine eos and no urinary retention ACE inhibitor discontinued Follows with Dr Julien Girt as outpatient, to have labs drawn 08/15/16 and results sent to SENephrology office for review   AICD (automatic cardioverter/defibrillator) present    Anemia 12/29/2016   Aortic stenosis    Arthritis    hands   ASD (atrial septal defect)    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   Asthma    as a child   Atrial fibrillation with RVR (HCC)    Biventricular ICD (implantable cardioverter-defibrillator) in place    CHF (congestive heart failure) (HCC)    CKD (chronic kidney disease), stage IV (HCC)     Colitis 09/11/2012   Complete heart block (HCC)    Dyspnea    Essential hypertension    GERD (gastroesophageal reflux disease)    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 Physical exam revealed cerumen impaction of left ear canal. Relieved by flushing.    Hypomagnesemia 04/10/2016   Hyponatremia 04/10/2016   Last Assessment & Plan:  - improved after tolvaptan 6/29, chiefly driven by CHF, further improvement with ongoing IV Lasix - continue low Na and fluid restriction - compliance with sodium restriction a major barrier to keeping patient out of hospital and compensated, remains resistant to this philosophy while inpatient - follows with Dr Julien Girt as outpatient, Neph consult not indicated at this ti   Hypothyroidism    ICD (implantable cardioverter-defibrillator) battery depletion 04/21/2014   Localized macular rash 08/11/2016   Last Assessment & Plan:  Seems to trend with heart failure, though she has more findings behind bilateral knees and up into her thigh/groin area than when I last saw her Would use steroid cream for now, consider  alternative diagnosis if no improvement   Long-term insulin use in type 2 diabetes (HCC) 04/10/2016   Last Assessment & Plan:  Hypoglycemia resolved, continue SSI and hypoglycemia protocol   Mitral regurgitation    Nonischemic cardiomyopathy (HCC)    On amiodarone therapy 08/21/2017   Persistent atrial fibrillation (HCC)    AV node ablation 2019   Pneumonia    PONV (postoperative nausea and vomiting)    has awakened during surgery   Restless leg syndrome 03/22/2019   Type 2 diabetes mellitus (HCC)     Assessment: 23 YOF presenting with nausea x2wk and ppor oral intake, hx of afib on warfarin PTA, INR on admission is  supratherapeutic at 5.5  Goal of Therapy:  INR 2-3 Monitor platelets by anticoagulation protocol: Yes   Plan:  Hold warfarin today Daily INR, s/s bleeding  Daylene Posey, PharmD, [facility] Clinical Pharmacist ED Pharmacist Phone # [phone] 04/26/2023 3:55 PM

## 2023-04-26 NOTE — H&P (Signed)
 [facility] History and Physical  Emily [name] ZOX:[id number] DOB: 1950/11/28 DOA: 04/26/2023   PCP: Glendale Chard, DO  Specialists: Followed by nephrology and cardiology  Chief Complaint: Nausea ongoing for a month  HPI: Emily [name] is a 73 y.o. female with a past medical history of chronic kidney disease stage IV, chronic systolic CHF with a EF of 30 to 35% based on echocardiogram in June 2024, permanent atrial fibrillation, hypothyroidism, insulin-dependent diabetes mellitus, essential hypertension who was in her usual state of health till about a month ago when she started feeling nauseated.  Her oral intake has been poor.  She has lost about 15 pounds in the last 1 month.  She went to see her primary care provider.  Subsequently she saw her nephrologist and underwent blood work on Monday.  She was called with the results today and asked to come to the emergency department due to worsening kidney function.  She denies any diarrhea per se.  She has seen a little bit of blood in her urine times.  Denies any blood in the stool.  In the emergency department she was found to have severe hyponatremia and worsening in her renal function.  She had borderline low blood pressures.  She will be hospitalized for further management.  Home Medications: This list is not reconciled yet Prior to Admission medications   Medication Sig Start Date End Date Taking? Authorizing Provider  acetaminophen (TYLENOL) 500 MG tablet Take 500 mg by mouth at bedtime as needed for mild pain.    [provider]  allopurinol (ZYLOPRIM) 100 MG tablet take 2 tablets in the morning and 1 tablet in the evening. 07/14/22   Valetta Close, MD  Biotin 1000 MCG tablet Take 1,000 mcg by mouth daily.    [provider]  Calcium Carb-Cholecalciferol (CALCIUM + VITAMIN D3 PO) Take 1 tablet by mouth daily.    [provider]  empagliflozin (JARDIANCE) 10 MG TABS tablet Take 1 tablet (10 mg total)  by mouth daily before breakfast. 03/14/23   Laurey Morale, MD  eplerenone (INSPRA) 25 MG tablet Take 1 tablet (25 mg total) by mouth daily. 10/11/22   Laurey Morale, MD  Glucosamine-Chondroitin (OSTEO BI-FLEX REGULAR STRENGTH PO) Take 1 tablet by mouth in the morning.    [provider]  insulin glargine, 2 Unit Dial, (TOUJEO MAX SOLOSTAR) 300 UNIT/ML Solostar Pen Inject 24 Units into the skin at bedtime. 07/01/22   Valetta Close, MD  insulin lispro (HUMALOG) 100 UNIT/ML injection Inject 4-8 Units into the skin daily as needed for high blood sugar. Sliding scale Less than 150 =0 150-200=4 Units 201-250=5 Units 251-300=6 Units 301-350=7 Units 351-400=8 Units    [provider]  Insulin Pen Needle (GLOBAL EASE INJECT PEN NEEDLES) 31G X 5 MM MISC use up to 4 times a day as directed. 04/11/23   Glendale Chard, DO  INSULIN SYRINGE .5CC/29G 29G X 1/2" 0.5 ML MISC Use to give yourself daily basal insulin (Lantus or Tuojeo). 05/09/22   Valetta Close, MD  levothyroxine (SYNTHROID) 88 MCG tablet Take 1 tablet (88 mcg total) by mouth daily before breakfast. 06/19/22   Valetta Close, MD  losartan (COZAAR) 25 MG tablet Take 0.5 tablets (12.5 mg total) by mouth daily. Patient not taking: Reported on 03/28/2023 01/20/23   Jacklynn Ganong, FNP  metolazone (ZAROXOLYN) 2.5 MG tablet Take 1 tablet (2.5 mg total) by mouth once a week. Every Tuesday  Take 1 Potassium  20 mEq tablet on Metolazone days. 02/14/23   Laurey Morale, MD  metoprolol succinate (TOPROL-XL) 100 MG 24 hr tablet Take 1 tablet (100 mg total) by mouth at bedtime. Take with or immediately following a meal. 03/13/23   Lee, [place], NP  nitroGLYCERIN (NITROSTAT) 0.4 MG SL tablet Place 1 tablet (0.4 mg total) under the tongue every 5 (five) minutes as needed for chest pain. 12/15/21 04/26/24  Jacklynn Ganong, FNP  Omega-3 Fatty Acids (FISH OIL) 1200 MG CPDR Take 1,200 mg by mouth in the morning.    [provider]  omeprazole (PRILOSEC) 40 MG capsule Take 1 capsule (40 mg total) by mouth at bedtime. 03/13/23   Lee, [place], NP  potassium chloride SA (KLOR-CON M20) 20 MEQ tablet Take 1 tablet (20 mEq total) by mouth once a week. Take 1 tablet (20 mEq total) with Metolazone dose. 02/14/23   Laurey Morale, MD  simvastatin (ZOCOR) 20 MG tablet Take 1 tablet (20 mg total) by mouth daily at 6 PM. 04/10/23   Laurey Morale, MD  torsemide (DEMADEX) 20 MG tablet Take 4 tablets (80 mg total) by mouth daily. 02/14/23   Laurey Morale, MD  warfarin (COUMADIN) 2 MG tablet take 1 tablet by mouth daily except 2 tablets on tuesday, thursday, & saturday or as directed by anticoagulation clinic. 04/10/23   Laurey Morale, MD    Allergies:  Allergies  Allergen Reactions   Amiodarone Rash   Other Palpitations    PT STATES SHE GETS REALLY HOT , DIZZY HEADED, HAS PASSED OUT TWICE , AND ITCHING WITH IV DYE   Codeine Other (See Comments)    Euphoria, hallucinations   Turmeric     Pt went into AFIB    Colchicine Rash   Iodine Rash    topical   Ivp Dye [Iodinated Contrast Media] Rash   Mexitil [Mexiletine] Rash    Past Medical History: Past Medical History:  Diagnosis Date   Acute pain of right shoulder 03/22/2019   Acute renal failure (HCC) 04/15/2012   Last Assessment & Plan:  Superimposed on CK D stage III, Baseline Cr ~2-2.5, remains above baseline, felt to be due to Cardiorenal syndrome, creatinine trending down at time of discharge Negative urine eos and no urinary retention ACE inhibitor discontinued Follows with Dr Julien Girt as outpatient, to have labs drawn 08/15/16 and results sent to [facility] office for review   AICD (automatic cardioverter/defibrillator) present    Anemia 12/29/2016   Aortic stenosis    Arthritis    hands   ASD (atrial septal defect)    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   Asthma    as a child   Atrial fibrillation with RVR (HCC)    Biventricular ICD (implantable  cardioverter-defibrillator) in place    CHF (congestive heart failure) (HCC)    CKD (chronic kidney disease), stage IV (HCC)    Colitis 09/11/2012   Complete heart block (HCC)    Dyspnea    Essential hypertension    GERD (gastroesophageal reflux disease)    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 Physical exam revealed cerumen impaction of left ear canal. Relieved by flushing.    Hypomagnesemia 04/10/2016   Hyponatremia 04/10/2016  Last Assessment & Plan:  - improved after tolvaptan 6/29, chiefly driven by CHF, further improvement with ongoing IV Lasix - continue low Na and fluid restriction - compliance with sodium restriction a major barrier to keeping patient out of hospital and compensated, remains resistant to this philosophy while inpatient - follows with Dr Julien Girt as outpatient, Neph consult not indicated at this ti   Hypothyroidism    ICD (implantable cardioverter-defibrillator) battery depletion 04/21/2014   Localized macular rash 08/11/2016   Last Assessment & Plan:  Seems to trend with heart failure, though she has more findings behind bilateral knees and up into her thigh/groin area than when I last saw her Would use steroid cream for now, consider alternative diagnosis if no improvement   Long-term insulin use in type 2 diabetes (HCC) 04/10/2016   Last Assessment & Plan:  Hypoglycemia resolved, continue SSI and hypoglycemia protocol   Mitral regurgitation    Nonischemic cardiomyopathy (HCC)    On amiodarone therapy 08/21/2017   Persistent atrial fibrillation (HCC)    AV node ablation 2019   Pneumonia    PONV (postoperative nausea and vomiting)    has awakened during surgery   Restless leg syndrome 03/22/2019   Type 2 diabetes mellitus (HCC)      Past Surgical History:  Procedure Laterality Date   ABLATION     AV NODE ABLATION N/A 08/08/2017   Procedure: AV NODE ABLATION;  Surgeon: Hillis Range, MD;  Location: [facility] INVASIVE CV LAB;  Service: Cardiovascular;  Laterality: N/A;   BREAST BIOPSY     CARDIAC DEFIBRILLATOR PLACEMENT  2005   MDT Viva XT CRT-D BiV ICD implanted by Dr Sharlot Gowda [place]   CATARACT EXTRACTION Bilateral    CHOLECYSTECTOMY     ICD GENERATOR CHANGEOUT N/A 05/10/2021   Procedure: ICD GENERATOR CHANGEOUT;  Surgeon: Marinus Maw, MD;  Location: [facility] INVASIVE CV LAB;  Service: Cardiovascular;  Laterality: N/A;   RIGHT HEART CATH N/A 04/28/2022   Procedure: RIGHT HEART CATH;  Surgeon: Laurey Morale, MD;  Location: [facility] INVASIVE CV LAB;  Service: Cardiovascular;  Laterality: N/A;   RIGHT HEART CATH N/A 07/15/2022   Procedure: RIGHT HEART CATH;  Surgeon: Laurey Morale, MD;  Location: [facility] INVASIVE CV LAB;  Service: Cardiovascular;  Laterality: N/A;   RIGHT HEART CATH AND CORONARY ANGIOGRAPHY N/A 12/17/2020   Procedure: RIGHT HEART CATH AND CORONARY ANGIOGRAPHY;  Surgeon: Laurey Morale, MD;  Location: [facility] INVASIVE CV LAB;  Service: Cardiovascular;  Laterality: N/A;   TEE WITHOUT CARDIOVERSION N/A 12/17/2020   Procedure: TRANSESOPHAGEAL ECHOCARDIOGRAM (TEE);  Surgeon: Laurey Morale, MD;  Location: [facility] ENDOSCOPY;  Service: Cardiovascular;  Laterality: N/A;   TEE WITHOUT CARDIOVERSION N/A 06/23/2022   Procedure: TRANSESOPHAGEAL ECHOCARDIOGRAM;  Surgeon: Laurey Morale, MD;  Location: [facility] INVASIVE CV LAB;  Service: Cardiovascular;  Laterality: N/A;   TEE WITHOUT CARDIOVERSION N/A 08/31/2022   Procedure: TRANSESOPHAGEAL ECHOCARDIOGRAM;  Surgeon: [name], MD;  Location: [facility] INVASIVE CV LAB;  Service: Cardiovascular;  Laterality: N/A;   THYROIDECTOMY     TOTAL VAGINAL HYSTERECTOMY     TRANSCATHETER MITRAL EDGE TO EDGE REPAIR N/A 08/31/2022   Procedure: MITRAL VALVE REPAIR;  Surgeon: [name], MD;   Location: [facility] INVASIVE CV LAB;  Service: Cardiovascular;  Laterality: N/A;    Social History: Lives with her daughter.  No smoking alcohol use or illicit drug use.   Family History:  Family History  Problem Relation Age of Onset   Hip  fracture Mother    COPD Father    CAD Sister    Heart attack Sister      Review of Systems - History obtained from the patient General ROS: positive for  - fatigue Psychological ROS: negative Ophthalmic ROS: negative ENT ROS: negative Allergy and Immunology ROS: negative Hematological and Lymphatic ROS: negative Endocrine ROS: negative Respiratory ROS: no cough, shortness of breath, or wheezing Cardiovascular ROS: no chest pain or dyspnea on exertion Gastrointestinal ROS: no abdominal pain, change in bowel habits, or black or bloody stools Genito-Urinary ROS: no dysuria, trouble voiding, or hematuria Musculoskeletal ROS: negative Neurological ROS: no TIA or stroke symptoms Dermatological ROS: negative  Physical Examination  Vitals:   04/26/23 1114 04/26/23 1315 04/26/23 1400 04/26/23 1508  BP:  101/70 97/85   Pulse:      Resp:  16 14   Temp:    97.7 F (36.5 C)  TempSrc:    Oral  Weight: 54.9 kg     Height: 5\' 2"  (1.575 m)       BP 97/85   Pulse 79   Temp 97.7 F (36.5 C) (Oral)   Resp 14   Ht 5\' 2"  (1.575 m)   Wt 54.9 kg   BMI 22.13 kg/m   General appearance: alert, cooperative, appears stated age, and no distress Head: Normocephalic, without obvious abnormality, atraumatic Eyes: conjunctivae/corneas clear. PERRL, EOM's intact.  Throat: lips, mucosa, and tongue normal; teeth and gums normal Neck: no adenopathy, no carotid bruit, no JVD, supple, symmetrical, trachea midline, and thyroid not enlarged, symmetric, no tenderness/mass/nodules Resp: clear to auscultation bilaterally Cardio: S1-S2 is normal regular.  Systolic murmur appreciated over the apex. GI: soft, non-tender; bowel sounds normal; no masses,  no  organomegaly Extremities: extremities normal, atraumatic, no cyanosis or edema Pulses: 2+ and symmetric Skin: Skin color, texture, turgor normal. No rashes or lesions Lymph nodes: Cervical, supraclavicular, and axillary nodes normal. Neurologic: Cranial nerves II to XII intact.  Motor strength equal bilateral upper extremities.  No pronator drift.  Had difficulty raising her light leg which she attributed to pain from her recent fall.  Essentially no focal deficits noted.    Labs on Admission: I have personally reviewed following labs and imaging studies  CBC: Recent Labs  Lab 04/26/23 1134  WBC 9.0  NEUTROABS 7.0  HGB 11.0*  HCT 32.7*  MCV 80.0  PLT 309   Basic Metabolic Panel: Recent Labs  Lab 04/26/23 1134  NA 116*  K 3.4*  CL 74*  CO2 18*  GLUCOSE 389*  BUN 147*  CREATININE 4.95*  CALCIUM 7.8*  MG 2.3   GFR: Estimated Creatinine Clearance: 7.9 mL/min (A) (by C-G formula based on SCr of 4.95 mg/dL (H)). Liver Function Tests: Recent Labs  Lab 04/26/23 1134  AST 24  ALT 14  ALKPHOS 91  BILITOT 1.9*  PROT 7.5  ALBUMIN 3.8   Coagulation Profile: Recent Labs  Lab 04/26/23 1321  INR 5.5*    CBG: Recent Labs  Lab 04/26/23 1208  GLUCAP 381*      Radiological Exams on Admission: DG Lumbar Spine Complete Result Date: 04/26/2023 CLINICAL DATA:  Fall.  Low back pain. EXAM: LUMBAR SPINE - COMPLETE 4+ VIEW; SACRUM AND COCCYX - 2+ VIEW COMPARISON:  None Available. FINDINGS: There are 5 nonrib-bearing lumbar vertebrae. There is mild loss of lumbar lordosis, which may be on the basis of positioning or due to muscle spasm. No spondylolysis. There is grade 1 anterolisthesis of L3 over L4, most  likely degenerative. Vertebral body heights are maintained. No aggressive osseous lesion. Mild multilevel degenerative changes in the form of facet arthropathy and marginal osteophyte formation. Visualized soft tissues are within normal limits. Pelvis is intact with normal and  symmetric sacroiliac joints. No acute fracture or dislocation. No aggressive osseous lesion. Visualized sacral arcuate lines are unremarkable. Unremarkable symphysis pubis. There are mild degenerative changes of bilateral hip joints without significant joint space narrowing. Osteophytosis of the superior acetabulum. No radiopaque foreign bodies. IMPRESSION: 1. No acute osseous abnormality of the lumbar spine or sacrum/coccyx. 2. Mild multilevel degenerative changes of the lumbar spine. 3. Mild degenerative changes of bilateral hip joints. Electronically Signed   By: Jules Schick M.D.   On: 04/26/2023 15:25   DG Sacrum/Coccyx Result Date: 04/26/2023 CLINICAL DATA:  Fall.  Low back pain. EXAM: LUMBAR SPINE - COMPLETE 4+ VIEW; SACRUM AND COCCYX - 2+ VIEW COMPARISON:  None Available. FINDINGS: There are 5 nonrib-bearing lumbar vertebrae. There is mild loss of lumbar lordosis, which may be on the basis of positioning or due to muscle spasm. No spondylolysis. There is grade 1 anterolisthesis of L3 over L4, most likely degenerative. Vertebral body heights are maintained. No aggressive osseous lesion. Mild multilevel degenerative changes in the form of facet arthropathy and marginal osteophyte formation. Visualized soft tissues are within normal limits. Pelvis is intact with normal and symmetric sacroiliac joints. No acute fracture or dislocation. No aggressive osseous lesion. Visualized sacral arcuate lines are unremarkable. Unremarkable symphysis pubis. There are mild degenerative changes of bilateral hip joints without significant joint space narrowing. Osteophytosis of the superior acetabulum. No radiopaque foreign bodies. IMPRESSION: 1. No acute osseous abnormality of the lumbar spine or sacrum/coccyx. 2. Mild multilevel degenerative changes of the lumbar spine. 3. Mild degenerative changes of bilateral hip joints. Electronically Signed   By: Jules Schick M.D.   On: 04/26/2023 15:25   CT Head Wo  Contrast Result Date: 04/26/2023 CLINICAL DATA:  Polytrauma, blunt.  Dizziness and weakness. EXAM: CT HEAD WITHOUT CONTRAST TECHNIQUE: Contiguous axial images were obtained from the base of the skull through the vertex without intravenous contrast. RADIATION DOSE REDUCTION: This exam was performed according to the departmental dose-optimization program which includes automated exposure control, adjustment of the mA and/or kV according to patient size and/or use of iterative reconstruction technique. COMPARISON:  None Available. FINDINGS: Brain: There is a 3 x 2 cm infarct along the left parieto-occipital sulcus which is of indeterminate age but may be subacute. Small age indeterminate but possibly recent infarcts are also questioned in the right cerebellar hemisphere. There is also a small age indeterminate infarct laterally in the left cerebellar hemisphere. No acute intracranial hemorrhage, mass, midline shift, or extra-axial fluid collection is identified. Cerebral volume is within normal limits for age. The ventricles are normal in size. Cerebral white matter hypo stops that hypodensities elsewhere in the cerebral white matter bilaterally are nonspecific but compatible with mild chronic small vessel ischemic disease. Vascular: Calcified atherosclerosis at the skull base. No hyperdense vessel. Skull: No acute fracture or suspicious lesion. Sinuses/Orbits: Small volume secretions in the left maxillary sinus. Clear mastoid air cells. Bilateral cataract extraction. Other: None. IMPRESSION: 1. Small to moderate-sized, potentially subacute left parieto-occipital infarct. 2. Small age indeterminate cerebellar infarcts. 3. Mild chronic small vessel ischemic disease. Electronically Signed   By: Sebastian Ache M.D.   On: 04/26/2023 14:30   DG Chest Portable 1 View Result Date: 04/26/2023 CLINICAL DATA:  Shortness of breath. EXAM: PORTABLE CHEST 1 VIEW  COMPARISON:  August 31, 2022. FINDINGS: Stable cardiomediastinal  silhouette. Left-sided defibrillator is again noted. Lungs are clear. Bony thorax is unremarkable. IMPRESSION: No active disease. Electronically Signed   By: Lupita Raider M.D.   On: 04/26/2023 13:15    My interpretation of Electrocardiogram: Sinus rhythm in the 80s.  Left axis deviation.  Nonspecific ST and T wave changes.  Interventricular conduction delay is noted.  Similar to previous EKG.   Problem List  Principal Problem:   AKI (acute kidney injury) (HCC) Active Problems:   ICD (implantable cardioverter-defibrillator) in place   CKD (chronic kidney disease) stage 4, GFR 15-29 ml/min (HCC)   Type 2 diabetes mellitus with diabetic chronic kidney disease (HCC)   Hyponatremia   Chronic systolic CHF (congestive heart failure) (HCC)   Assessment: This is a 73 year old Caucasian female with past medical history as stated earlier who comes in with worsening renal function noted on blood work done at her nephrologist office recently.  She also has hyponatremia.  She is noted to be on multiple diuretics.  She has had nausea for the past 1 month.  Reason for her worsening renal function is likely multifactorial including hypovolemia as well as progression of her chronic kidney disease.  Plan:  #1. Acute kidney injury on chronic kidney disease stage IV: Her creatinine was 2.83 in January.  Current creatinine is 4.95.  BUN is also significantly elevated at 147.  We will hold all of her nephrotoxic agents including the diuretics.  She is also noted to be on ARB which will be held.  Nephrology has been consulted.  Will defer IV fluids to nephrology.  #2. Hyponatremia: Corrected for glucose level of 389, Sodium is 123.  Sodium level was 132 in January.  Chloride is also low at 74.  All of this points towards hypovolemia.  She would benefit from IV hydration but will defer to nephrology.  #3.  Concern for subacute stroke on CT head: CT head incidentally showed small to moderate size left-sided  subacute infarct.  Patient denies any focal neurological deficits currently.  No recent event that she can recall that she had weakness on any one side especially on her right side.  She has had a couple of falls though.  Unfortunately due to ICD she cannot undergo MRI.  We will repeat CT head tomorrow morning.  Will do echocardiogram and carotid Dopplers.  She is currently on anticoagulation with warfarin.  INR is supratherapeutic.  No pronator drift on exam.  She did have some difficulty raising her right leg which she attributed to pain from recent fall.  PT and OT evaluation.  Will discuss with neurology in the morning.  Check HbA1c.  Check lipid panel.  #4. Diabetes mellitus type 2 in the setting of chronic kidney disease stage IV: She is on insulin at home.  Hypoglycemia is noted.  Check HbA1c.  Place her on SSI.  #5. Chronic systolic CHF: EF is noted to be 30 to 35% based on echocardiogram done in June 2024.  She has an ICD in situ.  She is followed by heart failure clinic.  Due to her worsening renal function and electrolyte abnormalities we will be holding her ARB and her diuretics including metolazone torsemide and eplerenone.  #6. History of permanent atrial fibrillation: Anticoagulated with warfarin.  INR supratherapeutic.  Warfarin to be managed by pharmacy.  Does admit to passing some blood in the urine.  Hemoglobin 11.0.  Will recheck labs tomorrow.  Holding metoprolol due  to low blood pressures.  #7. History of hypothyroidism: Continue with levothyroxine.  #8.  Weight loss: Could be just from poor oral intake.  Will do CT of the abdomen pelvis to rule out occult malignancy.  #9.  Hyperlipidemia: Noted to be on statin.  Will follow-up on lipid panel.   DVT Prophylaxis: Supratherapeutic INR Code Status: DNR.  Discussed with the patient and her daughter Family Communication: Discussed with patient and her daughter Disposition: Hopefully return home when improved Consults called:  Nephrology Admission Status: Status is: Inpatient Remains inpatient appropriate because: Acute kidney injury    Severity of Illness: The appropriate patient status for this patient is INPATIENT. Inpatient status is judged to be reasonable and necessary in order to provide the required intensity of service to ensure the patient's safety. The patient's presenting symptoms, physical exam findings, and initial radiographic and laboratory data in the context of their chronic comorbidities is felt to place them at high risk for further clinical deterioration. Furthermore, it is not anticipated that the patient will be medically stable for discharge from the hospital within 2 midnights of admission.   * I certify that at the point of admission it is my clinical judgment that the patient will require inpatient hospital care spanning beyond 2 midnights from the point of admission due to high intensity of service, high risk for further deterioration and high frequency of surveillance required.*   Further management decisions will depend on results of further testing and patient's response to treatment.   Nera Haworth [facility]  [employer] [employer] on [email or web address]  04/26/2023, 3:51 PM

## 2023-04-26 NOTE — ED Notes (Signed)
 Patient transported to CT

## 2023-04-26 NOTE — ED Provider Notes (Signed)
  EMERGENCY DEPARTMENT AT [facility] Provider Note   CSN: [id number] Arrival date & time: 04/26/23  1038     History  Chief Complaint  Patient presents with   Abnormal Lab    Emily [name] is a 73 y.o. female.  Pt is a 73 yo female with pmhx significant for afib (on coumadin), nonischemic cm, complete heart block s/p pacemaker placement, dm2, hyponatremia, chf, and ckd.  Pt has been nauseous for about 2 weeks.  She has not had a very good oral intake.  She has fallen several times and did hit her head.  She has pain in her back.  She did go to her nephrologist who did blood work on 2/24.  Results came back today and daughter was called and told to bring pt to the ED as her kidney function is worsening.         Home Medications Prior to Admission medications   Medication Sig Start Date End Date Taking? Authorizing Provider  acetaminophen (TYLENOL) 500 MG tablet Take 500 mg by mouth at bedtime as needed for mild pain.    [provider]  allopurinol (ZYLOPRIM) 100 MG tablet take 2 tablets in the morning and 1 tablet in the evening. 07/14/22   Valetta Close, MD  Biotin 1000 MCG tablet Take 1,000 mcg by mouth daily.    [provider]  Calcium Carb-Cholecalciferol (CALCIUM + VITAMIN D3 PO) Take 1 tablet by mouth daily.    [provider]  empagliflozin (JARDIANCE) 10 MG TABS tablet Take 1 tablet (10 mg total) by mouth daily before breakfast. 03/14/23   Laurey Morale, MD  eplerenone (INSPRA) 25 MG tablet Take 1 tablet (25 mg total) by mouth daily. 10/11/22   Laurey Morale, MD  Glucosamine-Chondroitin (OSTEO BI-FLEX REGULAR STRENGTH PO) Take 1 tablet by mouth in the morning.    [provider]  insulin glargine, 2 Unit Dial, (TOUJEO MAX SOLOSTAR) 300 UNIT/ML Solostar Pen Inject 24 Units into the skin at bedtime. 07/01/22   Valetta Close, MD  insulin lispro (HUMALOG) 100 UNIT/ML injection Inject 4-8 Units into the skin  daily as needed for high blood sugar. Sliding scale Less than 150 =0 150-200=4 Units 201-250=5 Units 251-300=6 Units 301-350=7 Units 351-400=8 Units    [provider]  Insulin Pen Needle (GLOBAL EASE INJECT PEN NEEDLES) 31G X 5 MM MISC use up to 4 times a day as directed. 04/11/23   Glendale Chard, DO  INSULIN SYRINGE .5CC/29G 29G X 1/2" 0.5 ML MISC Use to give yourself daily basal insulin (Lantus or Tuojeo). 05/09/22   Valetta Close, MD  levothyroxine (SYNTHROID) 88 MCG tablet Take 1 tablet (88 mcg total) by mouth daily before breakfast. 06/19/22   Valetta Close, MD  losartan (COZAAR) 25 MG tablet Take 0.5 tablets (12.5 mg total) by mouth daily. Patient not taking: Reported on 03/28/2023 01/20/23   Jacklynn Ganong, FNP  metolazone (ZAROXOLYN) 2.5 MG tablet Take 1 tablet (2.5 mg total) by mouth once a week. Every Tuesday  Take 1 Potassium 20 mEq tablet on Metolazone days. 02/14/23   Laurey Morale, MD  metoprolol succinate (TOPROL-XL) 100 MG 24 hr tablet Take 1 tablet (100 mg total) by mouth at bedtime. Take with or immediately following a meal. 03/13/23   Lee, [place], NP  nitroGLYCERIN (NITROSTAT) 0.4 MG SL tablet Place 1 tablet (0.4 mg total) under the tongue every 5 (five) minutes as needed for chest pain. 12/15/21  04/26/24  [place], Anderson [place], FNP  Omega-3 Fatty Acids (FISH OIL) 1200 MG CPDR Take 1,200 mg by mouth in the morning.    [provider]  omeprazole (PRILOSEC) 40 MG capsule Take 1 capsule (40 mg total) by mouth at bedtime. 03/13/23   Lee, [place], NP  potassium chloride SA (KLOR-CON M20) 20 MEQ tablet Take 1 tablet (20 mEq total) by mouth once a week. Take 1 tablet (20 mEq total) with Metolazone dose. 02/14/23   Laurey Morale, MD  simvastatin (ZOCOR) 20 MG tablet Take 1 tablet (20 mg total) by mouth daily at 6 PM. 04/10/23   Laurey Morale, MD  torsemide (DEMADEX) 20 MG tablet Take 4 tablets (80 mg total) by mouth daily. 02/14/23   Laurey Morale, MD   warfarin (COUMADIN) 2 MG tablet take 1 tablet by mouth daily except 2 tablets on tuesday, thursday, & saturday or as directed by anticoagulation clinic. 04/10/23   Laurey Morale, MD      Allergies    Amiodarone, Other, Codeine, Turmeric, Colchicine, Iodine, Ivp dye [iodinated contrast media], and Mexitil [mexiletine]    Review of Systems   Review of Systems  Gastrointestinal:  Positive for nausea.  All other systems reviewed and are negative.   Physical Exam Updated Vital Signs BP 97/85   Pulse 79   Temp 97.7 F (36.5 C) (Oral)   Resp 14   Ht 5\' 2"  (1.575 m)   Wt 54.9 kg   BMI 22.13 kg/m  Physical Exam Vitals and nursing note reviewed.  Constitutional:      Appearance: Normal appearance.  HENT:     Head: Normocephalic.     Comments: Contusion to forehead    Right Ear: External ear normal.     Left Ear: External ear normal.     Nose: Nose normal.     Mouth/Throat:     Mouth: Mucous membranes are moist.     Pharynx: Oropharynx is clear.  Eyes:     Extraocular Movements: Extraocular movements intact.     Conjunctiva/sclera: Conjunctivae normal.     Pupils: Pupils are equal, round, and reactive to light.  Cardiovascular:     Rate and Rhythm: Normal rate and regular rhythm.     Pulses: Normal pulses.     Heart sounds: Normal heart sounds.  Pulmonary:     Effort: Pulmonary effort is normal.     Breath sounds: Normal breath sounds.  Abdominal:     General: Abdomen is flat. Bowel sounds are normal.     Palpations: Abdomen is soft.  Musculoskeletal:        General: Normal range of motion.     Cervical back: Normal range of motion and neck supple.  Skin:    General: Skin is warm.     Capillary Refill: Capillary refill takes less than 2 seconds.  Neurological:     General: No focal deficit present.     Mental Status: She is alert and oriented to person, place, and time.  Psychiatric:        Mood and Affect: Mood normal.        Behavior: Behavior normal.         Thought Content: Thought content normal.        Judgment: Judgment normal.     ED Results / Procedures / Treatments   Labs (all labs ordered are listed, but only abnormal results are displayed) Labs Reviewed  CBC WITH DIFFERENTIAL/PLATELET - Abnormal; Notable for the following  components:      Result Value   Hemoglobin 11.0 (*)    HCT 32.7 (*)    RDW 18.0 (*)    All other components within normal limits  COMPREHENSIVE METABOLIC PANEL - Abnormal; Notable for the following components:   Sodium 116 (*)    Potassium 3.4 (*)    Chloride 74 (*)    CO2 18 (*)    Glucose, Bld 389 (*)    BUN 147 (*)    Creatinine, Ser 4.95 (*)    Calcium 7.8 (*)    Total Bilirubin 1.9 (*)    GFR, Estimated 9 (*)    Anion gap 24 (*)    All other components within normal limits  PROTIME-INR - Abnormal; Notable for the following components:   Prothrombin Time 49.9 (*)    INR 5.5 (*)    All other components within normal limits  CBG MONITORING, ED - Abnormal; Notable for the following components:   Glucose-Capillary 381 (*)    All other components within normal limits  MAGNESIUM  URINALYSIS, ROUTINE W REFLEX MICROSCOPIC    EKG EKG Interpretation Date/Time:  Wednesday April 26 2023 11:43:08 EST Ventricular Rate:  82 PR Interval:  138 QRS Duration:  162 QT Interval:  518 QTC Calculation: 606 R Axis:   232  Text Interpretation: Sinus rhythm Nonspecific intraventricular conduction delay Inferior infarct, age indeterminate Probable anteroseptal infarct, old VENTRICULAR PACED RHYTHM Confirmed by Jacalyn Lefevre [phone]) on 04/26/2023 11:58:39 AM  Radiology DG Lumbar Spine Complete Result Date: 04/26/2023 CLINICAL DATA:  Fall.  Low back pain. EXAM: LUMBAR SPINE - COMPLETE 4+ VIEW; SACRUM AND COCCYX - 2+ VIEW COMPARISON:  None Available. FINDINGS: There are 5 nonrib-bearing lumbar vertebrae. There is mild loss of lumbar lordosis, which may be on the basis of positioning or due to muscle spasm. No  spondylolysis. There is grade 1 anterolisthesis of L3 over L4, most likely degenerative. Vertebral body heights are maintained. No aggressive osseous lesion. Mild multilevel degenerative changes in the form of facet arthropathy and marginal osteophyte formation. Visualized soft tissues are within normal limits. Pelvis is intact with normal and symmetric sacroiliac joints. No acute fracture or dislocation. No aggressive osseous lesion. Visualized sacral arcuate lines are unremarkable. Unremarkable symphysis pubis. There are mild degenerative changes of bilateral hip joints without significant joint space narrowing. Osteophytosis of the superior acetabulum. No radiopaque foreign bodies. IMPRESSION: 1. No acute osseous abnormality of the lumbar spine or sacrum/coccyx. 2. Mild multilevel degenerative changes of the lumbar spine. 3. Mild degenerative changes of bilateral hip joints. Electronically Signed   By: Jules Schick M.D.   On: 04/26/2023 15:25   DG Sacrum/Coccyx Result Date: 04/26/2023 CLINICAL DATA:  Fall.  Low back pain. EXAM: LUMBAR SPINE - COMPLETE 4+ VIEW; SACRUM AND COCCYX - 2+ VIEW COMPARISON:  None Available. FINDINGS: There are 5 nonrib-bearing lumbar vertebrae. There is mild loss of lumbar lordosis, which may be on the basis of positioning or due to muscle spasm. No spondylolysis. There is grade 1 anterolisthesis of L3 over L4, most likely degenerative. Vertebral body heights are maintained. No aggressive osseous lesion. Mild multilevel degenerative changes in the form of facet arthropathy and marginal osteophyte formation. Visualized soft tissues are within normal limits. Pelvis is intact with normal and symmetric sacroiliac joints. No acute fracture or dislocation. No aggressive osseous lesion. Visualized sacral arcuate lines are unremarkable. Unremarkable symphysis pubis. There are mild degenerative changes of bilateral hip joints without significant joint space narrowing. Osteophytosis of the  superior acetabulum. No radiopaque foreign bodies. IMPRESSION: 1. No acute osseous abnormality of the lumbar spine or sacrum/coccyx. 2. Mild multilevel degenerative changes of the lumbar spine. 3. Mild degenerative changes of bilateral hip joints. Electronically Signed   By: Jules Schick M.D.   On: 04/26/2023 15:25   CT Head Wo Contrast Result Date: 04/26/2023 CLINICAL DATA:  Polytrauma, blunt.  Dizziness and weakness. EXAM: CT HEAD WITHOUT CONTRAST TECHNIQUE: Contiguous axial images were obtained from the base of the skull through the vertex without intravenous contrast. RADIATION DOSE REDUCTION: This exam was performed according to the departmental dose-optimization program which includes automated exposure control, adjustment of the mA and/or kV according to patient size and/or use of iterative reconstruction technique. COMPARISON:  None Available. FINDINGS: Brain: There is a 3 x 2 cm infarct along the left parieto-occipital sulcus which is of indeterminate age but may be subacute. Small age indeterminate but possibly recent infarcts are also questioned in the right cerebellar hemisphere. There is also a small age indeterminate infarct laterally in the left cerebellar hemisphere. No acute intracranial hemorrhage, mass, midline shift, or extra-axial fluid collection is identified. Cerebral volume is within normal limits for age. The ventricles are normal in size. Cerebral white matter hypo stops that hypodensities elsewhere in the cerebral white matter bilaterally are nonspecific but compatible with mild chronic small vessel ischemic disease. Vascular: Calcified atherosclerosis at the skull base. No hyperdense vessel. Skull: No acute fracture or suspicious lesion. Sinuses/Orbits: Small volume secretions in the left maxillary sinus. Clear mastoid air cells. Bilateral cataract extraction. Other: None. IMPRESSION: 1. Small to moderate-sized, potentially subacute left parieto-occipital infarct. 2. Small age  indeterminate cerebellar infarcts. 3. Mild chronic small vessel ischemic disease. Electronically Signed   By: Sebastian Ache M.D.   On: 04/26/2023 14:30   DG Chest Portable 1 View Result Date: 04/26/2023 CLINICAL DATA:  Shortness of breath. EXAM: PORTABLE CHEST 1 VIEW COMPARISON:  August 31, 2022. FINDINGS: Stable cardiomediastinal silhouette. Left-sided defibrillator is again noted. Lungs are clear. Bony thorax is unremarkable. IMPRESSION: No active disease. Electronically Signed   By: Lupita Raider M.D.   On: 04/26/2023 13:15    Procedures Procedures    Medications Ordered in ED Medications  sodium chloride 0.9 % bolus 1,000 mL (0 mLs Intravenous Stopped 04/26/23 1409)  ondansetron (ZOFRAN) injection 4 mg (4 mg Intravenous Given 04/26/23 1201)    ED Course/ Medical Decision Making/ A&P                                 Medical Decision Making Amount and/or Complexity of Data Reviewed Labs: ordered. Radiology: ordered.  Risk Prescription drug management. Decision regarding hospitalization.   This patient presents to the ED for concern of nausea, this involves an extensive number of treatment options, and is a complaint that carries with it a high risk of complications and morbidity.  The differential diagnosis includes hyponatremia, aki, head injury, infection   Co morbidities that complicate the patient evaluation  afib (on coumadin), nonischemic cm, complete heart block s/p pacemaker placement, dm2, hyponatremia, chf, and ckd   Additional history obtained:  Additional history obtained from epic chart review External records from outside source obtained and reviewed including daughter   Lab Tests:  I Ordered, and personally interpreted labs.  The pertinent results include:  cbc nl, cmp with na low at 116 (na 132 on 1/13), glucose elevated at 389, bun 147, cr 4.95 (bun  87 and cr 2.83 on 1/13); inr elevated at 5.5   Imaging Studies ordered:  I ordered imaging studies  including cxr, ct head, lumbar  I independently visualized and interpreted imaging which showed  CXR: No active disease.  CT head: Small to moderate-sized, potentially subacute left  parieto-occipital infarct.  2. Small age indeterminate cerebellar infarcts.  3. Mild chronic small vessel ischemic disease.  Lumbar spine: No acute osseous abnormality of the lumbar spine or  sacrum/coccyx.  2. Mild multilevel degenerative changes of the lumbar spine.  3. Mild degenerative changes of bilateral hip joints.  Sacrum:  No acute osseous abnormality of the lumbar spine or  sacrum/coccyx.  2. Mild multilevel degenerative changes of the lumbar spine.  3. Mild degenerative changes of bilateral hip joints.   I agree with the radiologist interpretation   Cardiac Monitoring:  The patient was maintained on a cardiac monitor.  I personally viewed and interpreted the cardiac monitored which showed an underlying rhythm of: nsr   Medicines ordered and prescription drug management:  I ordered medication including ivfs  for hypotension  Reevaluation of the patient after these medicines showed that the patient improved I have reviewed the patients home medicines and have made adjustments as needed   Test Considered:  ct   Critical Interventions:  ivfs   Consultations Obtained:  I requested consultation with the nephrologist (Dr. Thedore Mins),  and discussed lab and imaging findings as well as pertinent plan - he will consult Pt d/w hospitalists who will admit   Problem List / ED Course:  Hyponatremia:  corrected na is 121 which is still very low AKI:  likely prerenal due to n/v and poor oral intake. Subacute CVA:  pt has not noticed any sx c/w cva.  No prior dx of cva. Elevated INR:  coumadin will need to be held   Reevaluation:  After the interventions noted above, I reevaluated the patient and found that they have :improved   Social Determinants of Health:  Lives at  home   Dispostion:  After consideration of the diagnostic results and the patients response to treatment, I feel that the patent would benefit from admission.          Final Clinical Impression(s) / ED Diagnoses Final diagnoses:  AKI (acute kidney injury) (HCC)  Hyponatremia  Elevated INR  Cerebrovascular accident (CVA), unspecified mechanism (HCC)    Rx / DC Orders ED Discharge Orders     None         Jacalyn Lefevre, MD 04/26/23 1536

## 2023-04-27 ENCOUNTER — Inpatient Hospital Stay (HOSPITAL_COMMUNITY): Payer: PPO

## 2023-04-27 DIAGNOSIS — I5022 Chronic systolic (congestive) heart failure: Secondary | ICD-10-CM

## 2023-04-27 DIAGNOSIS — N179 Acute kidney failure, unspecified: Secondary | ICD-10-CM | POA: Diagnosis not present

## 2023-04-27 DIAGNOSIS — E871 Hypo-osmolality and hyponatremia: Secondary | ICD-10-CM | POA: Diagnosis not present

## 2023-04-27 DIAGNOSIS — I480 Paroxysmal atrial fibrillation: Secondary | ICD-10-CM

## 2023-04-27 DIAGNOSIS — I340 Nonrheumatic mitral (valve) insufficiency: Secondary | ICD-10-CM | POA: Diagnosis not present

## 2023-04-27 DIAGNOSIS — I350 Nonrheumatic aortic (valve) stenosis: Secondary | ICD-10-CM

## 2023-04-27 DIAGNOSIS — Z7189 Other specified counseling: Secondary | ICD-10-CM | POA: Diagnosis not present

## 2023-04-27 DIAGNOSIS — N184 Chronic kidney disease, stage 4 (severe): Secondary | ICD-10-CM

## 2023-04-27 DIAGNOSIS — I639 Cerebral infarction, unspecified: Secondary | ICD-10-CM

## 2023-04-27 DIAGNOSIS — R791 Abnormal coagulation profile: Secondary | ICD-10-CM | POA: Diagnosis not present

## 2023-04-27 DIAGNOSIS — Z515 Encounter for palliative care: Secondary | ICD-10-CM

## 2023-04-27 DIAGNOSIS — I6389 Other cerebral infarction: Secondary | ICD-10-CM | POA: Diagnosis not present

## 2023-04-27 LAB — ECHOCARDIOGRAM COMPLETE
AR max vel: 0.77 cm2
AV Area VTI: 0.67 cm2
AV Area mean vel: 0.81 cm2
AV Mean grad: 13.3 mm[Hg]
AV Peak grad: 22.7 mm[Hg]
Ao pk vel: 2.38 m/s
Area-P 1/2: 3.76 cm2
Calc EF: 39.5 %
Height: 62 in
MV M vel: 4.58 m/s
MV Peak grad: 83.9 mm[Hg]
Radius: 0.4 cm
S' Lateral: 4.9 cm
Single Plane A2C EF: 30.9 %
Single Plane A4C EF: 43.9 %
Weight: 1936 [oz_av]

## 2023-04-27 LAB — PROTIME-INR
INR: 5.1 (ref 0.8–1.2)
Prothrombin Time: 47.5 s — ABNORMAL HIGH (ref 11.4–15.2)

## 2023-04-27 LAB — GLUCOSE, CAPILLARY
Glucose-Capillary: 164 mg/dL — ABNORMAL HIGH (ref 70–99)
Glucose-Capillary: 187 mg/dL — ABNORMAL HIGH (ref 70–99)
Glucose-Capillary: 271 mg/dL — ABNORMAL HIGH (ref 70–99)
Glucose-Capillary: 218 mg/dL — ABNORMAL HIGH (ref 70–99)

## 2023-04-27 LAB — LIPID PANEL
Cholesterol: 108 mg/dL (ref 0–200)
HDL: 25 mg/dL — ABNORMAL LOW (ref >40)
LDL Cholesterol: 43 mg/dL (ref 0–99)
Total CHOL/HDL Ratio: 4.3 {ratio}
Triglycerides: 201 mg/dL — ABNORMAL HIGH (ref <150)
VLDL: 40 mg/dL (ref 0–40)

## 2023-04-27 LAB — BASIC METABOLIC PANEL
Anion gap: 18 — ABNORMAL HIGH (ref 5–15)
BUN: 143 mg/dL — ABNORMAL HIGH (ref 8–23)
CO2: 21 mmol/L — ABNORMAL LOW (ref 22–32)
Calcium: 7.6 mg/dL — ABNORMAL LOW (ref 8.9–10.3)
Chloride: 85 mmol/L — ABNORMAL LOW (ref 98–111)
Creatinine, Ser: 4.36 mg/dL — ABNORMAL HIGH (ref 0.44–1.00)
GFR, Estimated: 10 mL/min — ABNORMAL LOW (ref >60)
Glucose, Bld: 141 mg/dL — ABNORMAL HIGH (ref 70–99)
Potassium: 3.2 mmol/L — ABNORMAL LOW (ref 3.5–5.1)
Sodium: 124 mmol/L — ABNORMAL LOW (ref 135–145)

## 2023-04-27 LAB — CBC
HCT: 27.4 % — ABNORMAL LOW (ref 36.0–46.0)
Hemoglobin: 9.2 g/dL — ABNORMAL LOW (ref 12.0–15.0)
MCH: 27.5 pg (ref 26.0–34.0)
MCHC: 33.6 g/dL (ref 30.0–36.0)
MCV: 81.8 fL (ref 80.0–100.0)
Platelets: 240 10*3/uL (ref 150–400)
RBC: 3.35 MIL/uL — ABNORMAL LOW (ref 3.87–5.11)
RDW: 18.4 % — ABNORMAL HIGH (ref 11.5–15.5)
WBC: 8.3 10*3/uL (ref 4.0–10.5)
nRBC: 0 % (ref 0.0–0.2)

## 2023-04-27 LAB — HEMOGLOBIN A1C
Hgb A1c MFr Bld: 10.3 % — ABNORMAL HIGH (ref 4.8–5.6)
Mean Plasma Glucose: 248.91 mg/dL

## 2023-04-27 LAB — OSMOLALITY, URINE: Osmolality, Ur: 321 mosm/kg (ref 300–900)

## 2023-04-27 LAB — SODIUM, URINE, RANDOM: Sodium, Ur: 32 mmol/L

## 2023-04-27 MED ORDER — ORAL CARE MOUTH RINSE: 15.0000 mL | OROMUCOSAL | Status: DC | PRN

## 2023-04-27 MED ORDER — LACTATED RINGERS IV SOLN: INTRAVENOUS | Status: AC

## 2023-04-27 MED ORDER — PERFLUTREN LIPID MICROSPHERE
1.0000 mL | INTRAVENOUS | Status: AC | PRN
  Administered 2023-04-27: 1 mL via INTRAVENOUS

## 2023-04-27 MED ORDER — LIVING WELL WITH DIABETES BOOK
Freq: Once | Status: DC
  Filled 2023-04-27: qty 1

## 2023-04-27 MED ORDER — SENNOSIDES-DOCUSATE SODIUM 8.6-50 MG PO TABS
2.0000 | ORAL_TABLET | Freq: Two times a day (BID) | ORAL | Status: DC
  Administered 2023-04-27 – 2023-04-28 (×3): 2 via ORAL
  Filled 2023-04-27 (×4): qty 2

## 2023-04-27 MED ORDER — POTASSIUM CHLORIDE 20 MEQ PO PACK
20.0000 meq | PACK | Freq: Once | ORAL | Status: AC
  Administered 2023-04-27: 20 meq via ORAL
  Filled 2023-04-27: qty 1

## 2023-04-27 MED ORDER — WARFARIN - PHARMACIST DOSING INPATIENT: Freq: Every day | Status: DC

## 2023-04-27 NOTE — Progress Notes (Signed)
 PHARMACY - ANTICOAGULATION CONSULT NOTE  Pharmacy Consult for warfarin Indication: atrial fibrillation  Allergies  Allergen Reactions   Amiodarone Rash   Other Palpitations    PT STATES SHE GETS REALLY HOT , DIZZY HEADED, HAS PASSED OUT TWICE , AND ITCHING WITH IV DYE   Codeine Other (See Comments)    Euphoria, hallucinations   Turmeric     Pt went into AFIB    Colchicine Rash   Iodine Rash    topical   Ivp Dye [Iodinated Contrast Media] Rash   Mexitil [Mexiletine] Rash    Patient Measurements: Height: 5\' 2"  (157.5 cm) Weight: 54.9 kg (121 lb) IBW/kg (Calculated) : 50.1  Vital Signs: Temp: 98 F (36.7 C) (02/27 0441) Temp Source: Oral (02/27 0441) BP: 103/47 (02/27 0441) Pulse Rate: 79 (02/27 0441)  Labs: Recent Labs    04/26/23 1134 04/26/23 1321 04/26/23 2009 04/27/23 0600  HGB 11.0*  --   --  9.2*  HCT 32.7*  --   --  27.4*  PLT 309  --   --  240  LABPROT  --  49.9*  --  47.5*  INR  --  5.5*  --  5.1*  CREATININE 4.95*  --  4.78* 4.36*    Estimated Creatinine Clearance: 9 mL/min (A) (by C-G formula based on SCr of 4.36 mg/dL (H)).   Medical History: Past Medical History:  Diagnosis Date   Acute pain of right shoulder 03/22/2019   Acute renal failure (HCC) 04/15/2012   Last Assessment & Plan:  Superimposed on CK D stage III, Baseline Cr ~2-2.5, remains above baseline, felt to be due to Cardiorenal syndrome, creatinine trending down at time of discharge Negative urine eos and no urinary retention ACE inhibitor discontinued Follows with Dr Julien Girt as outpatient, to have labs drawn 08/15/16 and results sent to [facility] office for review   AICD (automatic cardioverter/defibrillator) present    Anemia 12/29/2016   Aortic stenosis    Arthritis    hands   ASD (atrial septal defect)    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   Asthma    as a child   Atrial fibrillation with RVR (HCC)    Biventricular ICD (implantable cardioverter-defibrillator) in place    CHF  (congestive heart failure) (HCC)    CKD (chronic kidney disease), stage IV (HCC)    Colitis 09/11/2012   Complete heart block (HCC)    Dyspnea    Essential hypertension    GERD (gastroesophageal reflux disease)    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 Physical exam revealed cerumen impaction of left ear canal. Relieved by flushing.    Hypomagnesemia 04/10/2016   Hyponatremia 04/10/2016   Last Assessment & Plan:  - improved after tolvaptan 6/29, chiefly driven by CHF, further improvement with ongoing IV Lasix - continue low Na and fluid restriction - compliance with sodium restriction a major barrier to keeping patient out of hospital and compensated, remains resistant to this philosophy while inpatient - follows with Dr Julien Girt as outpatient, Neph consult not indicated at this ti   Hypothyroidism    ICD (implantable cardioverter-defibrillator) battery depletion 04/21/2014   Localized macular rash 08/11/2016   Last Assessment & Plan:  Seems to trend with heart failure, though  she has more findings behind bilateral knees and up into her thigh/groin area than when I last saw her Would use steroid cream for now, consider alternative diagnosis if no improvement   Long-term insulin use in type 2 diabetes (HCC) 04/10/2016   Last Assessment & Plan:  Hypoglycemia resolved, continue SSI and hypoglycemia protocol   Mitral regurgitation    Nonischemic cardiomyopathy (HCC)    On amiodarone therapy 08/21/2017   Persistent atrial fibrillation (HCC)    AV node ablation 2019   Pneumonia    PONV (postoperative nausea and vomiting)    has awakened during surgery   Restless leg syndrome 03/22/2019   Type 2 diabetes mellitus (HCC)     Assessment: 32 YOF presenting with  nausea x2wk and ppor oral intake, hx of afib on warfarin PTA, INR on admission is supratherapeutic at 5.5 and remains elevated at 5.1.  Goal of Therapy:  INR 2-3 Monitor platelets by anticoagulation protocol: Yes   Plan:  Hold warfarin x1 dose Check INR daily while on warfarin Continue to monitor H&H and platelets   Thank you for allowing pharmacy to be a part of this patient's care.  Thelma Barge, PharmD Clinical Pharmacist

## 2023-04-27 NOTE — Consult Note (Signed)
 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 Rito Ehrlich.  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 Shirlee Latch and Dr. Ladona Ridgel. Patient has a long history of non-ischemic cardiomyotomy dating back to 59. She reportedly underwent myocardial biopsy at [facility] which showed myocarditis. She subsequently had a CRT-D device implanted. EF has ranged from as low as 10-15% to 30-35% since 2018. She also has a history of permanent atrial fibrillation and underwent AV nodal ablation with BiV pacing in 2019.   Echo in 11/2020 showed LVEF of 35-30% with severe LV dilatation, normal RV, moderate TR, mod-severe AS with mean gradient 9 mmHg but AVA 0.76 cm^2 (possible low gradient severe AS), mild-moderate MR, left =/> right shunt possible ASD. TEE was then done to better assess aortic valve and showed LVEF of 30% with moderate low flow/ low gradient AS, mild to moderate MR, small secundum ASD, and a possible calcified vegetation on the ICD lead.  R/ LHC in 11/2020 showed mild non-obstructive CAD with mild pulmonary venous hypertension and left =/> right atrial level shunt with Qp/ Qs 1.43.  Cath report was reviewed with Dr. Excell Seltzer and it was felt that aortic stenosis was not severe enough yet for TAVR and that the ASD was not significant enough to close percutaneously. In regards to possible calcified vegetation on ICD, blood cultures were negative and PET-CT was not suggestive of active endocarditis.   Last RHC in 06/2022 showed good cardiac output with normal filling pressures and prominent V waves in PCWP tracing suggestive of significant mitral regurgitation. Echo in 07/2022 showed LVEF of 30-35% with global hypokinesis and akinesis of the anteroseptal, anterior, anteroseptal wall from base to apex as well as moderately reduced RV function, and moderate MR (felt to be functional). PASCAL mTEER was attempted in 08/2022 but was unsuccessful.  Patient also has frequent PVC and has been unable to tolerate Amiodarone and Mexiletine in the past. Pacing rate was increased in 04/2022 by Dr. Ladona Ridgel and PVCs improved. Last Zio monitor in 01/2023 showed predominantly atrial fibrillation with 5.5% PVC burden and short runs of NSVT.  Patient was last seen by [place] Lee, NP, in 03/2023 at which time she was overall feeling poor. Her biggest complain was daily nausea and occasional vomiting. She also reported severe fatigue. However, she denied any chest pain or edema and palpitations had improved. Weight was down 8 lbs.   Patient presented to the ED on 04/26/2023 at the advice of her Nephrologist due to worsening renal function. She reported nausea and decreased PO intake for the last 2 weeks and reported falling multiple times. Labs were pertinent for Hgb 11, Na of 116, K 3.4, CO2  18, Glucose 389, BUN 147, Cr 4.95 (baseline around 2.8 to 3.2), Total Bili of 1.9, and Anion gap of 24. Head CT showed small to moderate sized potentially subacute left parieto-occipital infarct. Abdominal/ pelvic CT showed no acute findings. Nephrology was consulted and felt like AKI was likely prerenal in setting of profound  hyperglycemia and low PO intake. Hyponatremia was also felt to be due to hypovolemia. She was started on IV fluids.  Repeat Echo was ordered and showed LVEF of 35-40% with global hypokinesis, normal RV function, mild to moderate MR, and mild to moderate low flow/ low gradient AS.  Cardiology consulted for assistance with management of CHF. Patient reports nausea and decreased PO intake since 03/2023. She states she feels hungry but every time she eats, she gets nauseous and dry heaves. Because of this, she has not been eating or drinking much. She has lost 15 lbs since 03/2023. She has had progressive weakness with this as well as dizziness/ lightheadedness, near syncope, and falls. However, it sounds like she has been stable from a cardiac standpoint. She denies any chest pain or shortness of breath. She has chronic stable orthopnea but no PND or edema. No palpitations.    Past Medical History:  Diagnosis Date   Acute pain of right shoulder 03/22/2019   Acute renal failure (HCC) 04/15/2012   Last Assessment & Plan:  Superimposed on CK D stage III, Baseline Cr ~2-2.5, remains above baseline, felt to be due to Cardiorenal syndrome, creatinine trending down at time of discharge Negative urine eos and no urinary retention ACE inhibitor discontinued Follows with Dr Julien Girt as outpatient, to have labs drawn 08/15/16 and results sent to [facility] office for review   AICD (automatic cardioverter/defibrillator) present    Anemia 12/29/2016   Aortic stenosis    Arthritis    hands   ASD (atrial septal defect)    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   Asthma    as a child   Atrial fibrillation with RVR (HCC)    Biventricular ICD (implantable cardioverter-defibrillator) in place    CHF (congestive heart failure) (HCC)    CKD (chronic kidney disease), stage IV (HCC)    Colitis 09/11/2012   Complete heart block (HCC)    Dyspnea    Essential hypertension    GERD (gastroesophageal reflux disease)    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 Physical exam revealed cerumen impaction of left ear canal. Relieved by flushing.    Hypomagnesemia 04/10/2016   Hyponatremia 04/10/2016   Last Assessment & Plan:  - improved after tolvaptan 6/29, chiefly driven by CHF, further improvement with ongoing IV Lasix - continue low Na and fluid restriction - compliance with sodium restriction a major barrier to keeping patient out of hospital and compensated, remains resistant to this philosophy while inpatient - follows with Dr Julien Girt as outpatient, Neph consult not indicated at this ti   Hypothyroidism    ICD (implantable cardioverter-defibrillator) battery depletion 04/21/2014   Localized macular rash 08/11/2016   Last Assessment & Plan:  Seems to trend with heart failure, though she has more findings behind bilateral knees and up into her thigh/groin area than when I last saw her Would use steroid cream for now, consider alternative  diagnosis if no improvement   Long-term insulin use in type 2 diabetes (HCC) 04/10/2016   Last Assessment & Plan:  Hypoglycemia resolved, continue SSI and hypoglycemia protocol   Mitral regurgitation    Nonischemic cardiomyopathy (HCC)    On amiodarone therapy 08/21/2017   Persistent atrial fibrillation (HCC)    AV node ablation 2019   Pneumonia    PONV (postoperative nausea and vomiting)    has awakened during surgery   Restless leg syndrome 03/22/2019   Type 2 diabetes mellitus (HCC)     Past Surgical History:  Procedure Laterality Date   ABLATION     AV NODE ABLATION N/A 08/08/2017   Procedure: AV NODE ABLATION;  Surgeon: Hillis Range, MD;  Location: [facility] INVASIVE CV LAB;  Service: Cardiovascular;  Laterality: N/A;   BREAST BIOPSY     CARDIAC  DEFIBRILLATOR PLACEMENT  2005   MDT Viva XT CRT-D BiV ICD implanted by Dr Sharlot Gowda in [place]   CATARACT EXTRACTION Bilateral    CHOLECYSTECTOMY     ICD GENERATOR CHANGEOUT N/A 05/10/2021   Procedure: ICD GENERATOR CHANGEOUT;  Surgeon: Marinus Maw, MD;  Location: [facility] INVASIVE CV LAB;  Service: Cardiovascular;  Laterality: N/A;   RIGHT HEART CATH N/A 04/28/2022   Procedure: RIGHT HEART CATH;  Surgeon: Laurey Morale, MD;  Location: [facility] INVASIVE CV LAB;  Service: Cardiovascular;  Laterality: N/A;   RIGHT HEART CATH N/A 07/15/2022   Procedure: RIGHT HEART CATH;  Surgeon: Laurey Morale, MD;  Location: [facility] INVASIVE CV LAB;  Service: Cardiovascular;  Laterality: N/A;   RIGHT HEART CATH AND CORONARY ANGIOGRAPHY N/A 12/17/2020   Procedure: RIGHT HEART CATH AND CORONARY ANGIOGRAPHY;  Surgeon: Laurey Morale, MD;  Location: [facility] INVASIVE CV LAB;  Service: Cardiovascular;  Laterality: N/A;   TEE WITHOUT CARDIOVERSION N/A 12/17/2020   Procedure: TRANSESOPHAGEAL ECHOCARDIOGRAM (TEE);  Surgeon: Laurey Morale, MD;  Location: [facility] ENDOSCOPY;  Service: Cardiovascular;  Laterality: N/A;   TEE WITHOUT CARDIOVERSION N/A 06/23/2022   Procedure: TRANSESOPHAGEAL ECHOCARDIOGRAM;  Surgeon: Laurey Morale, MD;  Location: [facility] INVASIVE CV LAB;  Service: Cardiovascular;  Laterality: N/A;   TEE WITHOUT CARDIOVERSION N/A 08/31/2022   Procedure: TRANSESOPHAGEAL ECHOCARDIOGRAM;  Surgeon: [name], MD;  Location: [facility] INVASIVE CV LAB;  Service: Cardiovascular;  Laterality: N/A;   THYROIDECTOMY     TOTAL VAGINAL HYSTERECTOMY     TRANSCATHETER MITRAL EDGE TO EDGE REPAIR N/A 08/31/2022   Procedure: MITRAL VALVE REPAIR;  Surgeon: [name], MD;  Location: [facility] INVASIVE CV LAB;  Service: Cardiovascular;  Laterality: N/A;     Home Medications:  Prior to Admission medications   Medication Sig Start Date End Date Taking? Authorizing Provider  acetaminophen (TYLENOL) 500 MG tablet Take 500 mg by mouth at bedtime  as needed for mild pain.   Yes [provider]  allopurinol (ZYLOPRIM) 100 MG tablet take 2 tablets in the morning and 1 tablet in the evening. 07/14/22  Yes Valetta Close, MD  Biotin 1000 MCG tablet Take 1,000 mcg by mouth daily.   Yes [provider]  Calcium Carb-Cholecalciferol (CALCIUM + VITAMIN D3 PO) Take 1 tablet by mouth daily.   Yes [provider]  empagliflozin (JARDIANCE) 10 MG TABS tablet Take 1 tablet (10 mg total) by mouth daily before breakfast. 03/14/23  Yes Laurey Morale, MD  eplerenone (INSPRA) 25 MG tablet Take 1 tablet (25 mg total) by mouth daily. 10/11/22  Yes Laurey Morale, MD  Glucosamine-Chondroitin (  OSTEO BI-FLEX REGULAR STRENGTH PO) Take 1 tablet by mouth in the morning.   Yes [provider]  insulin glargine, 2 Unit Dial, (TOUJEO MAX SOLOSTAR) 300 UNIT/ML Solostar Pen Inject 24 Units into the skin at bedtime. 07/01/22  Yes Valetta Close, MD  insulin lispro (HUMALOG) 100 UNIT/ML injection Inject 4-8 Units into the skin daily as needed for high blood sugar. Sliding scale Less than 150 =0 150-200=4 Units 201-250=5 Units 251-300=6 Units 301-350=7 Units 351-400=8 Units   Yes [provider]  levothyroxine (SYNTHROID) 88 MCG tablet Take 1 tablet (88 mcg total) by mouth daily before breakfast. 06/19/22  Yes Valetta Close, MD  losartan (COZAAR) 25 MG tablet Take 0.5 tablets (12.5 mg total) by mouth daily. 01/20/23  Yes Milford, Anderson Malta, FNP  metolazone (ZAROXOLYN) 2.5 MG tablet Take 1 tablet (2.5 mg total) by mouth once a week. Every Tuesday  Take 1 Potassium 20 mEq tablet on Metolazone days. 02/14/23  Yes Laurey Morale, MD  metoprolol succinate (TOPROL-XL) 100 MG 24 hr tablet Take 1 tablet (100 mg total) by mouth at bedtime. Take with or immediately following a meal. Patient taking differently: Take 100 mg by mouth daily. Take with or immediately following a meal. 03/13/23  Yes Lee, [place], NP  nitroGLYCERIN  (NITROSTAT) 0.4 MG SL tablet Place 1 tablet (0.4 mg total) under the tongue every 5 (five) minutes as needed for chest pain. 12/15/21 04/26/24 Yes Milford, Anderson Malta, FNP  Omega-3 Fatty Acids (FISH OIL) 1200 MG CPDR Take 1,200 mg by mouth in the morning.   Yes [provider]  omeprazole (PRILOSEC) 40 MG capsule Take 1 capsule (40 mg total) by mouth at bedtime. Patient taking differently: Take 40 mg by mouth daily. 03/13/23  Yes Lee, [place], NP  potassium chloride SA (KLOR-CON M20) 20 MEQ tablet Take 1 tablet (20 mEq total) by mouth once a week. Take 1 tablet (20 mEq total) with Metolazone dose. 02/14/23  Yes Laurey Morale, MD  simvastatin (ZOCOR) 20 MG tablet Take 1 tablet (20 mg total) by mouth daily at 6 PM. 04/10/23  Yes Laurey Morale, MD  torsemide (DEMADEX) 20 MG tablet Take 4 tablets (80 mg total) by mouth daily. 02/14/23  Yes Laurey Morale, MD  warfarin (COUMADIN) 2 MG tablet take 1 tablet by mouth daily except 2 tablets on tuesday, thursday, & saturday or as directed by anticoagulation clinic. 04/10/23  Yes Laurey Morale, MD  Insulin Pen Needle (GLOBAL EASE INJECT PEN NEEDLES) 31G X 5 MM MISC use up to 4 times a day as directed. 04/11/23   Glendale Chard, DO  INSULIN SYRINGE .5CC/29G 29G X 1/2" 0.5 ML MISC Use to give yourself daily basal insulin (Lantus or Tuojeo). 05/09/22   Valetta Close, MD    Inpatient Medications: Scheduled Meds:  insulin aspart  0-5 Units Subcutaneous QHS   insulin aspart  0-9 Units Subcutaneous TID WC   insulin glargine  8 Units Subcutaneous QHS   levothyroxine  88 mcg Oral QAC breakfast   living well with diabetes book   Does not apply Once   Warfarin - Pharmacist Dosing Inpatient   Does not apply q1600   Continuous Infusions:  lactated ringers 50 mL/hr at 04/27/23 1335   PRN Meds: acetaminophen **OR** acetaminophen, ondansetron **OR** ondansetron (ZOFRAN) IV  Allergies:    Allergies  Allergen Reactions   Amiodarone Rash   Other  Palpitations    PT STATES SHE GETS REALLY HOT , DIZZY  HEADED, HAS PASSED OUT TWICE , AND ITCHING WITH IV DYE   Codeine Other (See Comments)    Euphoria, hallucinations   Turmeric     Pt went into AFIB    Colchicine Rash   Iodine Rash    topical   Ivp Dye [Iodinated Contrast Media] Rash   Mexitil [Mexiletine] Rash    Social History:   Social History   Socioeconomic History   Marital status: Married    Spouse name: Not on file   Number of children: Not on file   Years of education: Not on file   Highest education level: Not on file  Occupational History   Not on file  Tobacco Use   Smoking status: Never    Passive exposure: Never   Smokeless tobacco: Never  Vaping Use   Vaping status: Never Used  Substance and Sexual Activity   Alcohol use: No   Drug use: No   Sexual activity: Not on file  Other Topics Concern   Not on file  Social History Narrative   Not on file   Social Drivers of Health   Financial Resource Strain: Medium Risk (03/25/2023)   Received from [facility]   Overall Financial Resource Strain (CARDIA)    Difficulty of Paying Living Expenses: Somewhat hard  Food Insecurity: No Food Insecurity (04/26/2023)   Hunger Vital Sign    Worried About Running Out of Food in the Last Year: Never true    Ran Out of Food in the Last Year: Never true  Transportation Needs: No Transportation Needs (04/26/2023)   PRAPARE - Administrator, Civil Service (Medical): No    Lack of Transportation (Non-Medical): No  Recent Concern: Transportation Needs - Unmet Transportation Needs (03/25/2023)   Received from [facility]   [facility] - Transportation    Lack of Transportation (Medical): Yes    Lack of Transportation (Non-Medical): No  Physical Activity: Insufficiently Active (09/16/2022)   Exercise Vital Sign    Days of Exercise per Week: 3 days    Minutes of Exercise per Session: 10 min  Stress: No Stress Concern Present (09/16/2022)   [employer]  of Occupational Health - Occupational Stress Questionnaire    Feeling of Stress : Only a little  Social Connections: Moderately Integrated (04/26/2023)   Social Connection and Isolation Panel [NHANES]    Frequency of Communication with Friends and Family: More than three times a week    Frequency of Social Gatherings with Friends and Family: More than three times a week    Attends Religious Services: 1 to 4 times per year    Active Member of [employer] or Organizations: No    Attends Banker Meetings: Never    Marital Status: Married  Catering manager Violence: Not At Risk (04/26/2023)   Humiliation, Afraid, Rape, and Kick questionnaire    Fear of Current or Ex-Partner: No    Emotionally Abused: No    Physically Abused: No    Sexually Abused: No    Family History:   Family History  Problem Relation Age of Onset   Hip fracture Mother    COPD Father    CAD Sister    Heart attack Sister      ROS:  Please see the history of present illness.     Physical Exam/Data:   Vitals:   04/26/23 2140 04/27/23 0441 04/27/23 0755 04/27/23 1354  BP: (!) 103/52 (!) 103/47 (!) 120/59 114/70  Pulse: 81 79 80 78  Resp: 17 18 17 17   Temp: 98 F (36.7 C) 98 F (36.7 C) 97.6 F (36.4 C) 97.9 F (36.6 C)  TempSrc:  Oral  Oral  SpO2: 100% 99% 100% 100%  Weight:      Height:        Intake/Output Summary (Last 24 hours) at 04/27/2023 1555 Last data filed at 04/27/2023 0743 Gross per 24 hour  Intake --  Output 250 ml  Net -250 ml      04/26/2023   11:14 AM 03/13/2023   12:32 PM 02/14/2023    8:47 AM  Last 3 Weights  Weight (lbs) 121 lb 121 lb 129 lb  Weight (kg) 54.885 kg 54.885 kg 58.514 kg     Body mass index is 22.13 kg/m.  General: 73 y.o. thin frail Caucasian [name] resting comfortably in no acute distress. HEENT: Normocephalic and atraumatic.  Neck: Supple. No JVD. Heart: RRR. II/VI systolic murmur.  Lungs: No increased work of breathing. Clear to ausculation  bilaterally. No wheezes, rhonchi, or rales.  Abdomen: Soft, non-distended, and non-tender to palpation. Extremities: No lower extremity edema.    Skin: Warm and dry. Neuro: Alert and oriented x3. No focal deficits. Psych: Normal affect. Responds appropriately.   EKG:  The EKG was personally reviewed and demonstrates:  Ventricular paced rhythm.   Telemetry:  Telemetry was personally reviewed and demonstrates:  Ventricular paced rhythm with one 6 beat run of NSVT   Relevant CV Studies:  Echocardiogram 04/27/2023: Impressions:  1. Left ventricular ejection fraction, by estimation, is 35 to 40%. The  left ventricle has moderately decreased function. The left ventricle  demonstrates global hypokinesis. The left ventricular internal cavity size  was dilated. Left ventricular  diastolic parameters are indeterminate.   2. Right ventricular systolic function is normal. The right ventricular  size is normal.   3. The mitral valve is degenerative. Mild to moderate mitral valve  regurgitation.   4. Decreased stroke volume index, DVI 0.34. Likely a degree of low flow  low gradient aortic stenosis. The aortic valve was not well visualized.  Aortic valve regurgitation is not visualized. Mild to moderate aortic  valve stenosis.   5. The inferior vena cava is dilated in size with >50% respiratory  variability, suggesting right atrial pressure of 8 mmHg.   Comparison(s): No significant change from prior study. Prior images  reviewed side by side.   Laboratory Data:  High Sensitivity Troponin:  No results for input(s): "TROPONINIHS" in the last 720 hours.   Chemistry Recent Labs  Lab 04/26/23 1134 04/26/23 2009 04/27/23 0600  NA 116* 122* 124*  K 3.4* 3.1* 3.2*  CL 74* 81* 85*  CO2 18* 22 21*  GLUCOSE 389* 278* 141*  BUN 147* 142* 143*  CREATININE 4.95* 4.78* 4.36*  CALCIUM 7.8* 7.6* 7.6*  MG 2.3  --   --   GFRNONAA 9* 9* 10*  ANIONGAP 24* 19* 18*    Recent Labs  Lab  04/26/23 1134 04/26/23 2009  PROT 7.5  --   ALBUMIN 3.8 3.6  AST 24  --   ALT 14  --   ALKPHOS 91  --   BILITOT 1.9*  --    Lipids  Recent Labs  Lab 04/27/23 0600  CHOL 108  TRIG 201*  HDL 25*  LDLCALC 43  CHOLHDL 4.3    Hematology Recent Labs  Lab 04/26/23 1134 04/27/23 0600  WBC 9.0 8.3  RBC 4.09 3.35*  HGB 11.0* 9.2*  HCT 32.7* 27.4*  MCV 80.0 81.8  MCH 26.9 27.5  MCHC 33.6 33.6  RDW 18.0* 18.4*  PLT 309 240   Thyroid  Recent Labs  Lab 04/26/23 2009  TSH 0.505    BNPNo results for input(s): "BNP", "PROBNP" in the last 168 hours.  DDimer No results for input(s): "DDIMER" in the last 168 hours.   Radiology/Studies:  VAS US CAROTID Result Date: 04/27/2023 Carotid Arterial Duplex Study Patient Name:  Emily [name]  Date of Exam:   04/27/2023 Medical Rec #: [id number]        Accession #:    [id number] Date of Birth: June 20, 1950        Patient Gender: F Patient Age:   61 years Exam Location:  [facility] Procedure:      VAS US CAROTID Referring Phys: Osvaldo Shipper --------------------------------------------------------------------------------  Indications:       Subacute CVA. Risk Factors:      Hypertension, Diabetes. Other Factors:     Atrial fibrillation, CKD IV, CHF. Comparison Study:  No prior carotid duplex on file Performing Technologist: Sherren Kerns RVS  Examination Guidelines: A complete evaluation includes B-mode imaging, spectral Doppler, color Doppler, and power Doppler as needed of all accessible portions of each vessel. Bilateral testing is considered an integral part of a complete examination. Limited examinations for reoccurring indications may be performed as noted.  Right Carotid Findings: +----------+--------+--------+--------+------------------+------------------+           PSV cm/sEDV cm/sStenosisPlaque DescriptionComments           +----------+--------+--------+--------+------------------+------------------+ CCA Prox  83      11                                 intimal thickening +----------+--------+--------+--------+------------------+------------------+ CCA Distal87      12                                intimal thickening +----------+--------+--------+--------+------------------+------------------+ ICA Prox  97      21      1-39%   calcific                             +----------+--------+--------+--------+------------------+------------------+ ICA Mid   98      18                                                   +----------+--------+--------+--------+------------------+------------------+ ICA Distal155     33              heterogenous                         +----------+--------+--------+--------+------------------+------------------+ ECA       157     6               calcific                             +----------+--------+--------+--------+------------------+------------------+ +----------+--------+-------+--------+-------------------+           PSV cm/sEDV cmsDescribeArm Pressure (mmHG) +----------+--------+-------+--------+-------------------+ [id number]                                         +----------+--------+-------+--------+-------------------+ +---------+--------+--+--------+--+  VertebralPSV cm/s62EDV cm/s10 +---------+--------+--+--------+--+  Left Carotid Findings: +----------+--------+--------+--------+------------------+------------------+           PSV cm/sEDV cm/sStenosisPlaque DescriptionComments           +----------+--------+--------+--------+------------------+------------------+ CCA Prox  104     14                                intimal thickening +----------+--------+--------+--------+------------------+------------------+ CCA Distal98      12                                intimal thickening +----------+--------+--------+--------+------------------+------------------+ ICA Prox  131     22      1-39%   calcific          Shadowing           +----------+--------+--------+--------+------------------+------------------+ ICA Mid   137     18                                                   +----------+--------+--------+--------+------------------+------------------+ ICA Distal139     26              heterogenous                         +----------+--------+--------+--------+------------------+------------------+ ECA       176     2               calcific                             +----------+--------+--------+--------+------------------+------------------+ +----------+--------+--------+---------+-------------------+           PSV cm/sEDV cm/sDescribe Arm Pressure (mmHG) +----------+--------+--------+---------+-------------------+ [id number]             Turbulent                    +----------+--------+--------+---------+-------------------+ +---------+--------+--------+------------+ VertebralPSV cm/sEDV cm/sNot assessed +---------+--------+--------+------------+   Summary: Right Carotid: Velocities in the right ICA are consistent with a 1-39% stenosis. Left Carotid: Velocities in the left ICA are consistent with a 1-39% stenosis. Vertebrals:  Right vertebral artery demonstrates antegrade flow. Left not              assessed. Subclavians: Left subclavian artery flow was disturbed. Normal flow hemodynamics              were seen in the right subclavian artery. *See table(s) above for measurements and observations.     Preliminary    ECHOCARDIOGRAM COMPLETE Result Date: 04/27/2023    ECHOCARDIOGRAM REPORT   Patient Name:   Emily [name] Date of Exam: 04/27/2023 Medical Rec #:  [id number]       Height:       62.0 in Accession #:    [id number]      Weight:       121.0 lb Date of Birth:  1950-03-17       BSA:          1.544 m Patient Age:    72 years        BP:           120/59 mmHg Patient Gender: F  HR:           80 bpm. Exam Location:  Inpatient Procedure: 2D Echo, Cardiac Doppler, Color  Doppler and Intracardiac            Opacification Agent (Both Spectral and Color Flow Doppler were            utilized during procedure). Indications:    Stroke I63.9  History:        Patient has prior history of Echocardiogram examinations, most                 recent 04/28/2022. CHF and Cardiomyopathy, Stroke, Mitral Valve                 Disease; Risk Factors:Hypertension and Diabetes.  Sonographer:    Webb Laws Referring Phys: [id number] GOKUL KRISHNAN IMPRESSIONS  1. Left ventricular ejection fraction, by estimation, is 35 to 40%. The left ventricle has moderately decreased function. The left ventricle demonstrates global hypokinesis. The left ventricular internal cavity size was dilated. Left ventricular diastolic parameters are indeterminate.  2. Right ventricular systolic function is normal. The right ventricular size is normal.  3. The mitral valve is degenerative. Mild to moderate mitral valve regurgitation.  4. Decreased stroke volume index, DVI 0.34. Likely a degree of low flow low gradient aortic stenosis. The aortic valve was not well visualized. Aortic valve regurgitation is not visualized. Mild to moderate aortic valve stenosis.  5. The inferior vena cava is dilated in size with >50% respiratory variability, suggesting right atrial pressure of 8 mmHg. Comparison(s): No significant change from prior study. Prior images reviewed side by side. FINDINGS  Left Ventricle: Left ventricular ejection fraction, by estimation, is 35 to 40%. The left ventricle has moderately decreased function. The left ventricle demonstrates global hypokinesis. Definity contrast agent was given IV to delineate the left ventricular endocardial borders. Strain imaging was not performed. The left ventricular internal cavity size was dilated. There is no left ventricular hypertrophy. Left ventricular diastolic parameters are indeterminate. Right Ventricle: The right ventricular size is normal. No increase in right ventricular wall  thickness. Right ventricular systolic function is normal. Left Atrium: Left atrial size was normal in size. Right Atrium: Right atrial size was normal in size. Pericardium: There is no evidence of pericardial effusion. Mitral Valve: The mitral valve is degenerative in appearance. Mild to moderate mitral valve regurgitation. Tricuspid Valve: The tricuspid valve is normal in structure. Tricuspid valve regurgitation is trivial. No evidence of tricuspid stenosis. Aortic Valve: Decreased stroke volume index, DVI 0.34. Likely a degree of low flow low gradient aortic stenosis. The aortic valve was not well visualized. Aortic valve regurgitation is not visualized. Mild to moderate aortic stenosis is present. Aortic valve mean gradient measures 13.2 mmHg. Aortic valve peak gradient measures 22.7 mmHg. Aortic valve area, by VTI measures 0.67 cm. Pulmonic Valve: The pulmonic valve was normal in structure. Pulmonic valve regurgitation is not visualized. No evidence of pulmonic stenosis. Aorta: The aortic root is normal in size and structure. Venous: The inferior vena cava is dilated in size with greater than 50% respiratory variability, suggesting right atrial pressure of 8 mmHg. IAS/Shunts: The interatrial septum was not well visualized. Additional Comments: 3D imaging was not performed. A device lead is visualized in the right ventricle and right atrium.  LEFT VENTRICLE PLAX 2D LVIDd:         5.30 cm      Diastology LVIDs:         4.90 cm  LV e' medial:    5.22 cm/s LV PW:         0.90 cm      LV E/e' medial:  25.9 LV IVS:        1.00 cm      LV e' lateral:   7.29 cm/s LVOT diam:     1.60 cm      LV E/e' lateral: 18.5 LV SV:         37 LV SV Index:   24 LVOT Area:     2.01 cm  LV Volumes (MOD) LV vol d, MOD A2C: 90.8 ml LV vol d, MOD A4C: 100.0 ml LV vol s, MOD A2C: 62.7 ml LV vol s, MOD A4C: 56.1 ml LV SV MOD A2C:     28.1 ml LV SV MOD A4C:     100.0 ml LV SV MOD BP:      38.9 ml RIGHT VENTRICLE            IVC RV Basal  diam:  3.40 cm    IVC diam: 2.20 cm RV S prime:     6.42 cm/s TAPSE (M-mode): 1.8 cm LEFT ATRIUM             Index        RIGHT ATRIUM           Index LA diam:        3.70 cm 2.40 cm/m   RA Area:     13.70 cm LA Vol (A2C):   23.7 ml 15.35 ml/m  RA Volume:   31.70 ml  20.53 ml/m LA Vol (A4C):   45.2 ml 29.27 ml/m LA Biplane Vol: 34.8 ml 22.54 ml/m  AORTIC VALVE AV Area (Vmax):    0.77 cm AV Area (Vmean):   0.81 cm AV Area (VTI):     0.67 cm AV Vmax:           238.25 cm/s AV Vmean:          170.750 cm/s AV VTI:            0.546 m AV Peak Grad:      22.7 mmHg AV Mean Grad:      13.2 mmHg LVOT Vmax:         90.70 cm/s LVOT Vmean:        68.600 cm/s LVOT VTI:          0.183 m LVOT/AV VTI ratio: 0.34  AORTA Ao Asc diam: 2.30 cm MITRAL VALVE                  TRICUSPID VALVE MV Area (PHT): 3.76 cm       TR Peak grad:   21.3 mmHg MV Decel Time: 202 msec       TR Vmax:        231.00 cm/s MR Peak grad:    83.9 mmHg MR Mean grad:    49.0 mmHg    SHUNTS MR Vmax:         458.00 cm/s  Systemic VTI:  0.18 m MR Vmean:        330.0 cm/s   Systemic Diam: 1.60 cm MR PISA:         1.01 cm MR PISA Eff ROA: 7 mm MR PISA Radius:  0.40 cm MV E velocity: 135.00 cm/s Riley Lam MD Electronically signed by Riley Lam MD Signature Date/Time: 04/27/2023/12:54:19 PM    Final    CT HEAD WO CONTRAST ( ) Result Date:  04/27/2023 CLINICAL DATA:  Stroke follow-up EXAM: CT HEAD WITHOUT CONTRAST TECHNIQUE: Contiguous axial images were obtained from the base of the skull through the vertex without intravenous contrast. RADIATION DOSE REDUCTION: This exam was performed according to the departmental dose-optimization program which includes automated exposure control, adjustment of the mA and/or kV according to patient size and/or use of iterative reconstruction technique. COMPARISON:  CT head 04/26/2023 FINDINGS: Brain: Similar appearance of hypoattenuation in the left parieto-occipital lobes suggestive of possible  subacute infarct. No evidence of hemorrhagic conversion. No additional areas of infarct appreciated. Nonspecific hypoattenuation in the periventricular and subcortical white matter favored to reflect chronic microvascular ischemic changes. No edema, mass effect, or midline shift. The basilar cisterns are patent. Ventricles: The ventricles are normal. Vascular: Atherosclerotic calcifications of the carotid siphons and intracranial vertebral arteries. No hyperdense vessel. Skull: No acute or aggressive finding. Orbits: Orbits are symmetric. Sinuses: The visualized paranasal sinuses are clear. Other: Mastoid air cells are clear. IMPRESSION: Similar appearance of probably subacute infarct in the left parietooccipital lobes. No evidence of hemorrhagic conversion. Electronically Signed   By: Emily Filbert M.D.   On: 04/27/2023 11:00   CT ABDOMEN PELVIS WO CONTRAST Result Date: 04/26/2023 CLINICAL DATA:  Abdominal pain, weight loss, acute kidney injury EXAM: CT ABDOMEN AND PELVIS WITHOUT CONTRAST TECHNIQUE: Multidetector CT imaging of the abdomen and pelvis was performed following the standard protocol without IV contrast. RADIATION DOSE REDUCTION: This exam was performed according to the departmental dose-optimization program which includes automated exposure control, adjustment of the mA and/or kV according to patient size and/or use of iterative reconstruction technique. COMPARISON:  None Available. FINDINGS: Lower chest: No acute abnormality.  ICD wires in the right heart. Hepatobiliary: No focal liver abnormality is seen. Status post cholecystectomy. No biliary dilatation. Pancreas: No focal abnormality or ductal dilatation. Spleen: No focal abnormality.  Normal size. Adrenals/Urinary Tract: Renovascular calcifications within the hila and kidneys bilaterally. No visible urinary tract stones. No hydronephrosis. No renal or adrenal mass. Urinary bladder unremarkable. Stomach/Bowel: Stomach, large and small bowel  grossly unremarkable. Vascular/Lymphatic: Diffuse vascular calcifications. No aneurysm or adenopathy. Reproductive: Prior hysterectomy.  No adnexal masses. Other: No free fluid or free air. Musculoskeletal: No acute bony abnormality IMPRESSION: No acute findings in the abdomen or pelvis. Diffuse aortic/arterial atherosclerosis. Electronically Signed   By: Charlett Nose M.D.   On: 04/26/2023 21:29   DG Lumbar Spine Complete Result Date: 04/26/2023 CLINICAL DATA:  Fall.  Low back pain. EXAM: LUMBAR SPINE - COMPLETE 4+ VIEW; SACRUM AND COCCYX - 2+ VIEW COMPARISON:  None Available. FINDINGS: There are 5 nonrib-bearing lumbar vertebrae. There is mild loss of lumbar lordosis, which may be on the basis of positioning or due to muscle spasm. No spondylolysis. There is grade 1 anterolisthesis of L3 over L4, most likely degenerative. Vertebral body heights are maintained. No aggressive osseous lesion. Mild multilevel degenerative changes in the form of facet arthropathy and marginal osteophyte formation. Visualized soft tissues are within normal limits. Pelvis is intact with normal and symmetric sacroiliac joints. No acute fracture or dislocation. No aggressive osseous lesion. Visualized sacral arcuate lines are unremarkable. Unremarkable symphysis pubis. There are mild degenerative changes of bilateral hip joints without significant joint space narrowing. Osteophytosis of the superior acetabulum. No radiopaque foreign bodies. IMPRESSION: 1. No acute osseous abnormality of the lumbar spine or sacrum/coccyx. 2. Mild multilevel degenerative changes of the lumbar spine. 3. Mild degenerative changes of bilateral hip joints. Electronically Signed   By: Timoteo Expose.D.  On: 04/26/2023 15:25   DG Sacrum/Coccyx Result Date: 04/26/2023 CLINICAL DATA:  Fall.  Low back pain. EXAM: LUMBAR SPINE - COMPLETE 4+ VIEW; SACRUM AND COCCYX - 2+ VIEW COMPARISON:  None Available. FINDINGS: There are 5 nonrib-bearing lumbar vertebrae.  There is mild loss of lumbar lordosis, which may be on the basis of positioning or due to muscle spasm. No spondylolysis. There is grade 1 anterolisthesis of L3 over L4, most likely degenerative. Vertebral body heights are maintained. No aggressive osseous lesion. Mild multilevel degenerative changes in the form of facet arthropathy and marginal osteophyte formation. Visualized soft tissues are within normal limits. Pelvis is intact with normal and symmetric sacroiliac joints. No acute fracture or dislocation. No aggressive osseous lesion. Visualized sacral arcuate lines are unremarkable. Unremarkable symphysis pubis. There are mild degenerative changes of bilateral hip joints without significant joint space narrowing. Osteophytosis of the superior acetabulum. No radiopaque foreign bodies. IMPRESSION: 1. No acute osseous abnormality of the lumbar spine or sacrum/coccyx. 2. Mild multilevel degenerative changes of the lumbar spine. 3. Mild degenerative changes of bilateral hip joints. Electronically Signed   By: Jules Schick M.D.   On: 04/26/2023 15:25   CT Head Wo Contrast Result Date: 04/26/2023 CLINICAL DATA:  Polytrauma, blunt.  Dizziness and weakness. EXAM: CT HEAD WITHOUT CONTRAST TECHNIQUE: Contiguous axial images were obtained from the base of the skull through the vertex without intravenous contrast. RADIATION DOSE REDUCTION: This exam was performed according to the departmental dose-optimization program which includes automated exposure control, adjustment of the mA and/or kV according to patient size and/or use of iterative reconstruction technique. COMPARISON:  None Available. FINDINGS: Brain: There is a 3 x 2 cm infarct along the left parieto-occipital sulcus which is of indeterminate age but may be subacute. Small age indeterminate but possibly recent infarcts are also questioned in the right cerebellar hemisphere. There is also a small age indeterminate infarct laterally in the left cerebellar  hemisphere. No acute intracranial hemorrhage, mass, midline shift, or extra-axial fluid collection is identified. Cerebral volume is within normal limits for age. The ventricles are normal in size. Cerebral white matter hypo stops that hypodensities elsewhere in the cerebral white matter bilaterally are nonspecific but compatible with mild chronic small vessel ischemic disease. Vascular: Calcified atherosclerosis at the skull base. No hyperdense vessel. Skull: No acute fracture or suspicious lesion. Sinuses/Orbits: Small volume secretions in the left maxillary sinus. Clear mastoid air cells. Bilateral cataract extraction. Other: None. IMPRESSION: 1. Small to moderate-sized, potentially subacute left parieto-occipital infarct. 2. Small age indeterminate cerebellar infarcts. 3. Mild chronic small vessel ischemic disease. Electronically Signed   By: Sebastian Ache M.D.   On: 04/26/2023 14:30   DG Chest Portable 1 View Result Date: 04/26/2023 CLINICAL DATA:  Shortness of breath. EXAM: PORTABLE CHEST 1 VIEW COMPARISON:  August 31, 2022. FINDINGS: Stable cardiomediastinal silhouette. Left-sided defibrillator is again noted. Lungs are clear. Bony thorax is unremarkable. IMPRESSION: No active disease. Electronically Signed   By: Lupita Raider M.D.   On: 04/26/2023 13:15     Assessment and Plan:   Chronic HFrEF Non-Ischemic Cardiomyopathy S/p CRT-D Patient has a long history of non-ischemic cardiomyopathy dating back to 1988. 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 EF in 07/2022 was 30-35%. Patient was admitted for AKI and severe hyponatremia in setting of nausea and poor PO intake. Echo this admission shows 35-40% with global hypokinesis, normal RV function, mild to moderate MR, and mild to moderate low  flow/ low gradient AS. - Appears dry on exam. - Home diuretics (Torsemide and Metolazone) as well as all GDMT (Losartan, Eplerenone, Toprol-XL, and Jardiance)  are all currently on hold due to AKI and hypovolemia. Continue IV fluids per Nephrology. - Continue to monitor volume status closely.  Non-Obstructive CAD Mild non-obstructive disease noted on cardiac catheterization in 11/2020. - No chest pain.  - No aspirin given need for Coumadin. - Continue statin.  Permanent Atrial Fibrillation Frequent PVCs S/p AV nodal ablation with BiV pacing in 2019. - Telemetry shows V-paced rhythm with rates in the 80s. - Continue chronic anticoagulation with Coumadin. INR 5.1. Dosing per pharmacy.   Aortic Stenosis Mitral Regurgitation Patient has a history of low flow/ low gradient AS not previously felt to be severe enough for TAVR. He also has a history of moderate functional MR. mTEER was attempted in 08/2022 but was unsuccessful. Echo this admission shows mild to moderate low flow/ low gradient AS. - Continue to monitor as an outpatient.  ASD Small ASD with left =/> right shunt noted on prior TEE and RHC. Not felt to be large enough for percutaneous repair.  Hypertension BP soft at times but stable. - Continue to hold home antihypertensives.   AKI on CKD Stage IV Baseline creatinine around 2.8 to 3.2. Creatinine 4.95 on admission. Nephrology consulted and felt to likely be pre-renal in setting of poor PO intake. Being treated with IV fluids. - Continue IV fluids.  - Management per Nephrology. Nephrology has introduced conversations about dialysis. Patient asked whether her heart would be able to handle this. EF is actually higher on Echo today than it has been in a while and RV function is normal which is a good sign; however, she has not been felt to be an ideal dialysis candidate. Palliative Care has been consulted.  Dizziness/ Lightheadedness Falls Patient reports dizziness/ lightheadedness, near syncope, and falls prior to admission. Head CTA did show a potential subacute infarct. - Suspect symptoms are due to dehydration and hypovolemia. Do not  suspect this was due to any arrhythmia. No palpitations. Do not think we need to interrogate ICD but will discuss with MD.  Otherwise, per primary team: - Severe hyponatremia: improving - Subacute stroke - Hypothyroidism - Hypokalemia - Type 2 diabetes mellitus - Nausea    Risk Assessment/Risk Scores:    New York Heart Association (NYHA) Functional Class NYHA Class III. Functional status has been limited by profound weakness not shortness of breath.   CHA2DS2-VASc Score = 8  This indicates a 10.8% annual risk of stroke. The patient's score is based upon: CHF History: 1 HTN History: 1 Diabetes History: 1 Stroke History: 2 Vascular Disease History: 1 Age Score: 1 Gender Score: 1     For questions or updates, please contact [facility] Please consult [email or web address] for contact info under    Signed, Corrin Parker, PA-C  04/27/2023 3:55 PM

## 2023-04-27 NOTE — Progress Notes (Signed)
 VASCULAR LAB    Carotid duplex has been performed.  See CV proc for preliminary results.   Marquet Faircloth, RVT 04/27/2023, 3:42 PM

## 2023-04-27 NOTE — Progress Notes (Signed)
 [facility] Progress Note    Assessment/ Plan:   AKI on CKD 4 -followed at our [place] satellite office. Likely prerenal injury in the context of profound hyperglycemia, low PO intake. -UA pending, CT abd/pel pending -continue to hold home diuretics -Cr down to 4.78 -fluids: clinically looks dry still, will c/w gentle LR @ 50cc/hr x 1 day and then reassess. Recommend having a low threshold to stop fluids if any concerns for hypervolemia -renal replacement therapy indications: no urgent indications but this may very well change. I am concerned about her tolerating hemodialysis in the long run given her functional status and underlying HFrEF. She may be a candidate for PD in the long run but again, her functional status may be challenging for this and they did report to me that she won't be able to lay flat for PD -appreciate palliative care's assistance -Avoid nephrotoxic medications including NSAIDs and iodinated intravenous contrast exposure unless the latter is absolutely indicated.  Preferred narcotic agents for pain control are hydromorphone, fentanyl, and methadone. Morphine should not be used. Avoid Baclofen and avoid oral sodium phosphate and magnesium citrate based laxatives / bowel preps. Continue strict Input and Output monitoring. Will monitor the patient closely with you and intervene or adjust therapy as indicated by changes in clinical status/labs    Severe Hyponatremia -suspecting this is hypovolemic in nature given exam and history -improving, up to 124 with isotonic fluids  Subacute CVA -per primary   Acidosis -secondary to AKI, monitor for now, supplement once bicarb <16   HFrEF -clinically looks dry on exam still -diuretics on hold as above -echo pending   Anemia of CKD -transfuse PRN -checking fe panel   Uncontrolled Diabetes Mellitus Type 2 with Hyperglycemia -per primary  Hypokalemia -kcl packet x 1 today  Subjective:   Patient  seen and examined bedside. She reports feeling much better as compared to yesterday, able to eat a lot now. Daughter at bedside and they are currently having a meeting with palliative care They are requesting [facility] to come by to see her Has also been found to have a subacute ischemic stroke   Objective:   BP (!) 120/59 (BP Location: Right Arm)   Pulse 80   Temp 97.6 F (36.4 C)   Resp 17   Ht 5\' 2"  (1.575 m)   Wt 54.9 kg   SpO2 100%   BMI 22.13 kg/m   Intake/Output Summary (Last 24 hours) at 04/27/2023 1216 Last data filed at 04/27/2023 1610 Gross per 24 hour  Intake --  Output 250 ml  Net -250 ml   Weight change:   Physical Exam: Gen: NAD, chronically ill appearing CVS: RRR +systolic mumur Resp: CTA B/L Abd: soft  Ext: no edema Neuro: awake, alert  Imaging: CT HEAD WO CONTRAST ( ) Result Date: 04/27/2023 CLINICAL DATA:  Stroke follow-up EXAM: CT HEAD WITHOUT CONTRAST TECHNIQUE: Contiguous axial images were obtained from the base of the skull through the vertex without intravenous contrast. RADIATION DOSE REDUCTION: This exam was performed according to the departmental dose-optimization program which includes automated exposure control, adjustment of the mA and/or kV according to patient size and/or use of iterative reconstruction technique. COMPARISON:  CT head 04/26/2023 FINDINGS: Brain: Similar appearance of hypoattenuation in the left parieto-occipital lobes suggestive of possible subacute infarct. No evidence of hemorrhagic conversion. No additional areas of infarct appreciated. Nonspecific hypoattenuation in the periventricular and subcortical white matter favored to reflect chronic microvascular ischemic changes. No edema, mass effect, or midline  shift. The basilar cisterns are patent. Ventricles: The ventricles are normal. Vascular: Atherosclerotic calcifications of the carotid siphons and intracranial vertebral arteries. No hyperdense vessel. Skull: No acute or aggressive  finding. Orbits: Orbits are symmetric. Sinuses: The visualized paranasal sinuses are clear. Other: Mastoid air cells are clear. IMPRESSION: Similar appearance of probably subacute infarct in the left parietooccipital lobes. No evidence of hemorrhagic conversion. Electronically Signed   By: Emily Filbert M.D.   On: 04/27/2023 11:00   CT ABDOMEN PELVIS WO CONTRAST Result Date: 04/26/2023 CLINICAL DATA:  Abdominal pain, weight loss, acute kidney injury EXAM: CT ABDOMEN AND PELVIS WITHOUT CONTRAST TECHNIQUE: Multidetector CT imaging of the abdomen and pelvis was performed following the standard protocol without IV contrast. RADIATION DOSE REDUCTION: This exam was performed according to the departmental dose-optimization program which includes automated exposure control, adjustment of the mA and/or kV according to patient size and/or use of iterative reconstruction technique. COMPARISON:  None Available. FINDINGS: Lower chest: No acute abnormality.  ICD wires in the right heart. Hepatobiliary: No focal liver abnormality is seen. Status post cholecystectomy. No biliary dilatation. Pancreas: No focal abnormality or ductal dilatation. Spleen: No focal abnormality.  Normal size. Adrenals/Urinary Tract: Renovascular calcifications within the hila and kidneys bilaterally. No visible urinary tract stones. No hydronephrosis. No renal or adrenal mass. Urinary bladder unremarkable. Stomach/Bowel: Stomach, large and small bowel grossly unremarkable. Vascular/Lymphatic: Diffuse vascular calcifications. No aneurysm or adenopathy. Reproductive: Prior hysterectomy.  No adnexal masses. Other: No free fluid or free air. Musculoskeletal: No acute bony abnormality IMPRESSION: No acute findings in the abdomen or pelvis. Diffuse aortic/arterial atherosclerosis. Electronically Signed   By: Charlett Nose M.D.   On: 04/26/2023 21:29   DG Lumbar Spine Complete Result Date: 04/26/2023 CLINICAL DATA:  Fall.  Low back pain. EXAM: LUMBAR SPINE  - COMPLETE 4+ VIEW; SACRUM AND COCCYX - 2+ VIEW COMPARISON:  None Available. FINDINGS: There are 5 nonrib-bearing lumbar vertebrae. There is mild loss of lumbar lordosis, which may be on the basis of positioning or due to muscle spasm. No spondylolysis. There is grade 1 anterolisthesis of L3 over L4, most likely degenerative. Vertebral body heights are maintained. No aggressive osseous lesion. Mild multilevel degenerative changes in the form of facet arthropathy and marginal osteophyte formation. Visualized soft tissues are within normal limits. Pelvis is intact with normal and symmetric sacroiliac joints. No acute fracture or dislocation. No aggressive osseous lesion. Visualized sacral arcuate lines are unremarkable. Unremarkable symphysis pubis. There are mild degenerative changes of bilateral hip joints without significant joint space narrowing. Osteophytosis of the superior acetabulum. No radiopaque foreign bodies. IMPRESSION: 1. No acute osseous abnormality of the lumbar spine or sacrum/coccyx. 2. Mild multilevel degenerative changes of the lumbar spine. 3. Mild degenerative changes of bilateral hip joints. Electronically Signed   By: Jules Schick M.D.   On: 04/26/2023 15:25   DG Sacrum/Coccyx Result Date: 04/26/2023 CLINICAL DATA:  Fall.  Low back pain. EXAM: LUMBAR SPINE - COMPLETE 4+ VIEW; SACRUM AND COCCYX - 2+ VIEW COMPARISON:  None Available. FINDINGS: There are 5 nonrib-bearing lumbar vertebrae. There is mild loss of lumbar lordosis, which may be on the basis of positioning or due to muscle spasm. No spondylolysis. There is grade 1 anterolisthesis of L3 over L4, most likely degenerative. Vertebral body heights are maintained. No aggressive osseous lesion. Mild multilevel degenerative changes in the form of facet arthropathy and marginal osteophyte formation. Visualized soft tissues are within normal limits. Pelvis is intact with normal and symmetric sacroiliac joints.  No acute fracture or  dislocation. No aggressive osseous lesion. Visualized sacral arcuate lines are unremarkable. Unremarkable symphysis pubis. There are mild degenerative changes of bilateral hip joints without significant joint space narrowing. Osteophytosis of the superior acetabulum. No radiopaque foreign bodies. IMPRESSION: 1. No acute osseous abnormality of the lumbar spine or sacrum/coccyx. 2. Mild multilevel degenerative changes of the lumbar spine. 3. Mild degenerative changes of bilateral hip joints. Electronically Signed   By: Jules Schick M.D.   On: 04/26/2023 15:25   CT Head Wo Contrast Result Date: 04/26/2023 CLINICAL DATA:  Polytrauma, blunt.  Dizziness and weakness. EXAM: CT HEAD WITHOUT CONTRAST TECHNIQUE: Contiguous axial images were obtained from the base of the skull through the vertex without intravenous contrast. RADIATION DOSE REDUCTION: This exam was performed according to the departmental dose-optimization program which includes automated exposure control, adjustment of the mA and/or kV according to patient size and/or use of iterative reconstruction technique. COMPARISON:  None Available. FINDINGS: Brain: There is a 3 x 2 cm infarct along the left parieto-occipital sulcus which is of indeterminate age but may be subacute. Small age indeterminate but possibly recent infarcts are also questioned in the right cerebellar hemisphere. There is also a small age indeterminate infarct laterally in the left cerebellar hemisphere. No acute intracranial hemorrhage, mass, midline shift, or extra-axial fluid collection is identified. Cerebral volume is within normal limits for age. The ventricles are normal in size. Cerebral white matter hypo stops that hypodensities elsewhere in the cerebral white matter bilaterally are nonspecific but compatible with mild chronic small vessel ischemic disease. Vascular: Calcified atherosclerosis at the skull base. No hyperdense vessel. Skull: No acute fracture or suspicious lesion.  Sinuses/Orbits: Small volume secretions in the left maxillary sinus. Clear mastoid air cells. Bilateral cataract extraction. Other: None. IMPRESSION: 1. Small to moderate-sized, potentially subacute left parieto-occipital infarct. 2. Small age indeterminate cerebellar infarcts. 3. Mild chronic small vessel ischemic disease. Electronically Signed   By: Sebastian Ache M.D.   On: 04/26/2023 14:30   DG Chest Portable 1 View Result Date: 04/26/2023 CLINICAL DATA:  Shortness of breath. EXAM: PORTABLE CHEST 1 VIEW COMPARISON:  August 31, 2022. FINDINGS: Stable cardiomediastinal silhouette. Left-sided defibrillator is again noted. Lungs are clear. Bony thorax is unremarkable. IMPRESSION: No active disease. Electronically Signed   By: Lupita Raider M.D.   On: 04/26/2023 13:15    Labs: BMET Recent Labs  Lab 04/26/23 1134 04/26/23 2009 04/27/23 0600  NA 116* 122* 124*  K 3.4* 3.1* 3.2*  CL 74* 81* 85*  CO2 18* 22 21*  GLUCOSE 389* 278* 141*  BUN 147* 142* 143*  CREATININE 4.95* 4.78* 4.36*  CALCIUM 7.8* 7.6* 7.6*  PHOS  --  6.8*  --    CBC Recent Labs  Lab 04/26/23 1134 04/27/23 0600  WBC 9.0 8.3  NEUTROABS 7.0  --   HGB 11.0* 9.2*  HCT 32.7* 27.4*  MCV 80.0 81.8  PLT 309 240    Medications:     insulin aspart  0-5 Units Subcutaneous QHS   insulin aspart  0-9 Units Subcutaneous TID WC   insulin glargine  8 Units Subcutaneous QHS   levothyroxine  88 mcg Oral QAC breakfast   living well with diabetes book   Does not apply Once   Warfarin - Pharmacist Dosing Inpatient   Does not apply [id number]      Anthony Sar, MD [facility] 04/27/2023, 12:16 PM

## 2023-04-27 NOTE — Progress Notes (Signed)
 [facility] PROGRESS NOTE   MILLENA CALLINS VWU:[id number] DOB: 1950/05/23 DOA: 04/26/2023  PCP: Glendale Chard, DO  Brief History: 73 y.o. female with a past medical history of chronic kidney disease stage IV, chronic systolic CHF with a EF of 30 to 35% based on echocardiogram in June 2024, permanent atrial fibrillation, hypothyroidism, insulin-dependent diabetes mellitus, essential hypertension who was in her usual state of health till about a month ago when she started feeling nauseated.  Her oral intake has been poor.  She has lost about 15 pounds in the last 1 month.  She went to see her primary care provider.  Subsequently she saw her nephrologist and underwent blood work which revealed significant worsening in her renal function.  She was sent to the hospital.  Subsequently hospitalized.    Consultants: Nephrology  Procedures: None yet    Subjective/Interval History: Patient rested well last night.  Has urinated.  Some improvement in her nausea.  Was able to tolerate diet last night.    Assessment/Plan:   Acute kidney injury on chronic kidney disease stage IV Her creatinine was 2.83 in January.  Presented with creatinine of 4.95 and BUN of 147.  All of her diuretics and nephrotoxic agents including ARB were held. Nephrology was consulted. Patient started on IV fluids. Creatinine noted to be 4.36 today.  BUN is unchanged. CT of the abdomen pelvis did not show any acute findings. Management per nephrology.   Hyponatremia/hypokalemia Corrected for glucose her sodium level was 123 at admission.  She was gently started on IV fluids.  Sodium level is stable this morning. Defer management to nephrology.    Concern for subacute stroke on CT head CT head incidentally showed small to moderate size left-sided subacute infarct.  Patient did not have any focal deficits.  She attributes difficulty moving her right leg due to recent fall and injury to her right knee.    Unfortunately due to ICD she cannot undergo MRI.   CT to be repeated today. Await echocardiogram and carotid Dopplers.  She is currently anticoagulated with warfarin.  INR was supratherapeutic. PT and OT evaluation. LDL is 43.  She was on statin prior to admission. HbA1c is pending. Once we have results of CT scan and other studies we will discuss with neurology.  Do not anticipate any major changes to her treatment plan.   Diabetes mellitus type 2 in the setting of chronic kidney disease stage IV Continue SSI glargine.  HbA1c is pending.   Chronic systolic CHF EF is noted to be 30 to 35% based on echocardiogram done in June 2024.  She has an ICD in situ.  She is followed by heart failure clinic.  Due to her worsening renal function and electrolyte abnormalities we or holding her ARB and diuretics including metolazone and torsemide and eplerenone. Monitor for signs of volume overload   History of permanent atrial fibrillation Anticoagulated with warfarin.  INR supratherapeutic.  Warfarin to be managed by pharmacy.   Drop in hemoglobin is likely dilutional.  No evidence of overt bleeding.  She does admit to seeing some blood in the urine but no hematuria noted on UA.  CT scan did not show any acute findings. Metoprolol on hold due to hypotension.  Should be able to resume in the next 24 hours if blood pressure stabilizes.   History of hypothyroidism Continue with levothyroxine.   Weight loss Could be just from poor oral intake.  CT of the abdomen pelvis did not show any  overt malignancy.  Outpatient monitoring.   Hyperlipidemia Was on statin prior to admission.  LDL is 43.   DVT Prophylaxis: On warfarin with supratherapeutic INR Code Status: DNR Family Communication: Discussed with patient.  No family at bedside today Disposition Plan: Hopefully return home when improved  Status is: Inpatient Remains inpatient appropriate because: Acute kidney injury,  hyponatremia      Medications: Scheduled:  insulin aspart  0-5 Units Subcutaneous QHS   insulin aspart  0-9 Units Subcutaneous TID WC   insulin glargine  8 Units Subcutaneous QHS   levothyroxine  88 mcg Oral QAC breakfast   Warfarin - Pharmacist Dosing Inpatient   Does not apply q1600   Continuous:  lactated ringers 50 mL/hr at 04/26/23 1708   WUJ:[id number] **OR** acetaminophen, ondansetron **OR** ondansetron (ZOFRAN) IV, perflutren lipid microspheres (DEFINITY) IV suspension  Antibiotics: Anti-infectives (From admission, onward)    None       Objective:  Vital Signs  Vitals:   04/26/23 1831 04/26/23 2140 04/27/23 0441 04/27/23 0755  BP: (!) 89/64 (!) 103/52 (!) 103/47 (!) 120/59  Pulse: 81 81 79 80  Resp: 14 17 18 17   Temp: 98.1 F (36.7 C) 98 F (36.7 C) 98 F (36.7 C) 97.6 F (36.4 C)  TempSrc: Oral  Oral   SpO2: 100% 100% 99% 100%  Weight:      Height:        Intake/Output Summary (Last 24 hours) at 04/27/2023 1049 Last data filed at 04/27/2023 0743 Gross per 24 hour  Intake --  Output 250 ml  Net -250 ml   Filed Weights   04/26/23 1114  Weight: 54.9 kg    General appearance: Awake alert.  In no distress Resp: Clear to auscultation bilaterally.  Normal effort Cardio: S1-S2 is normal regular.  No S3-S4.  No rubs murmurs or bruit GI: Abdomen is soft.  Nontender nondistended.  Bowel sounds are present normal.  No masses organomegaly Extremities: No edema.  Difficulty moving her right lower extremity due to right knee pain.  No joint swelling noted.  Passive movements without any tenderness. Neurologic: Alert and oriented x3.  No focal neurological deficits.    Lab Results:  Data Reviewed: I have personally reviewed following labs and reports of the imaging studies  CBC: Recent Labs  Lab 04/26/23 1134 04/27/23 0600  WBC 9.0 8.3  NEUTROABS 7.0  --   HGB 11.0* 9.2*  HCT 32.7* 27.4*  MCV 80.0 81.8  PLT 309 240    Basic Metabolic  Panel: Recent Labs  Lab 04/26/23 1134 04/26/23 2009 04/27/23 0600  NA 116* 122* 124*  K 3.4* 3.1* 3.2*  CL 74* 81* 85*  CO2 18* 22 21*  GLUCOSE 389* 278* 141*  BUN 147* 142* 143*  CREATININE 4.95* 4.78* 4.36*  CALCIUM 7.8* 7.6* 7.6*  MG 2.3  --   --   PHOS  --  6.8*  --     GFR: Estimated Creatinine Clearance: 9 mL/min (A) (by C-G formula based on SCr of 4.36 mg/dL (H)).  Liver Function Tests: Recent Labs  Lab 04/26/23 1134 04/26/23 2009  AST 24  --   ALT 14  --   ALKPHOS 91  --   BILITOT 1.9*  --   PROT 7.5  --   ALBUMIN 3.8 3.6    Coagulation Profile: Recent Labs  Lab 04/26/23 1321 04/27/23 0600  INR 5.5* 5.1*    CBG: Recent Labs  Lab 04/26/23 1208 04/26/23 1704 04/26/23 2141 04/27/23 1308  GLUCAP 381* 288* 267* 164*    Lipid Profile: Recent Labs    04/27/23 0600  CHOL 108  HDL 25*  LDLCALC 43  TRIG 562*  CHOLHDL 4.3    Thyroid Function Tests: Recent Labs    04/26/23 2009  TSH 0.505     Radiology Studies: CT ABDOMEN PELVIS WO CONTRAST Result Date: 04/26/2023 CLINICAL DATA:  Abdominal pain, weight loss, acute kidney injury EXAM: CT ABDOMEN AND PELVIS WITHOUT CONTRAST TECHNIQUE: Multidetector CT imaging of the abdomen and pelvis was performed following the standard protocol without IV contrast. RADIATION DOSE REDUCTION: This exam was performed according to the departmental dose-optimization program which includes automated exposure control, adjustment of the mA and/or kV according to patient size and/or use of iterative reconstruction technique. COMPARISON:  None Available. FINDINGS: Lower chest: No acute abnormality.  ICD wires in the right heart. Hepatobiliary: No focal liver abnormality is seen. Status post cholecystectomy. No biliary dilatation. Pancreas: No focal abnormality or ductal dilatation. Spleen: No focal abnormality.  Normal size. Adrenals/Urinary Tract: Renovascular calcifications within the hila and kidneys bilaterally. No  visible urinary tract stones. No hydronephrosis. No renal or adrenal mass. Urinary bladder unremarkable. Stomach/Bowel: Stomach, large and small bowel grossly unremarkable. Vascular/Lymphatic: Diffuse vascular calcifications. No aneurysm or adenopathy. Reproductive: Prior hysterectomy.  No adnexal masses. Other: No free fluid or free air. Musculoskeletal: No acute bony abnormality IMPRESSION: No acute findings in the abdomen or pelvis. Diffuse aortic/arterial atherosclerosis. Electronically Signed   By: Charlett Nose M.D.   On: 04/26/2023 21:29   DG Lumbar Spine Complete Result Date: 04/26/2023 CLINICAL DATA:  Fall.  Low back pain. EXAM: LUMBAR SPINE - COMPLETE 4+ VIEW; SACRUM AND COCCYX - 2+ VIEW COMPARISON:  None Available. FINDINGS: There are 5 nonrib-bearing lumbar vertebrae. There is mild loss of lumbar lordosis, which may be on the basis of positioning or due to muscle spasm. No spondylolysis. There is grade 1 anterolisthesis of L3 over L4, most likely degenerative. Vertebral body heights are maintained. No aggressive osseous lesion. Mild multilevel degenerative changes in the form of facet arthropathy and marginal osteophyte formation. Visualized soft tissues are within normal limits. Pelvis is intact with normal and symmetric sacroiliac joints. No acute fracture or dislocation. No aggressive osseous lesion. Visualized sacral arcuate lines are unremarkable. Unremarkable symphysis pubis. There are mild degenerative changes of bilateral hip joints without significant joint space narrowing. Osteophytosis of the superior acetabulum. No radiopaque foreign bodies. IMPRESSION: 1. No acute osseous abnormality of the lumbar spine or sacrum/coccyx. 2. Mild multilevel degenerative changes of the lumbar spine. 3. Mild degenerative changes of bilateral hip joints. Electronically Signed   By: Jules Schick M.D.   On: 04/26/2023 15:25   DG Sacrum/Coccyx Result Date: 04/26/2023 CLINICAL DATA:  Fall.  Low back pain.  EXAM: LUMBAR SPINE - COMPLETE 4+ VIEW; SACRUM AND COCCYX - 2+ VIEW COMPARISON:  None Available. FINDINGS: There are 5 nonrib-bearing lumbar vertebrae. There is mild loss of lumbar lordosis, which may be on the basis of positioning or due to muscle spasm. No spondylolysis. There is grade 1 anterolisthesis of L3 over L4, most likely degenerative. Vertebral body heights are maintained. No aggressive osseous lesion. Mild multilevel degenerative changes in the form of facet arthropathy and marginal osteophyte formation. Visualized soft tissues are within normal limits. Pelvis is intact with normal and symmetric sacroiliac joints. No acute fracture or dislocation. No aggressive osseous lesion. Visualized sacral arcuate lines are unremarkable. Unremarkable symphysis pubis. There are mild degenerative changes of bilateral hip joints  without significant joint space narrowing. Osteophytosis of the superior acetabulum. No radiopaque foreign bodies. IMPRESSION: 1. No acute osseous abnormality of the lumbar spine or sacrum/coccyx. 2. Mild multilevel degenerative changes of the lumbar spine. 3. Mild degenerative changes of bilateral hip joints. Electronically Signed   By: Jules Schick M.D.   On: 04/26/2023 15:25   CT Head Wo Contrast Result Date: 04/26/2023 CLINICAL DATA:  Polytrauma, blunt.  Dizziness and weakness. EXAM: CT HEAD WITHOUT CONTRAST TECHNIQUE: Contiguous axial images were obtained from the base of the skull through the vertex without intravenous contrast. RADIATION DOSE REDUCTION: This exam was performed according to the departmental dose-optimization program which includes automated exposure control, adjustment of the mA and/or kV according to patient size and/or use of iterative reconstruction technique. COMPARISON:  None Available. FINDINGS: Brain: There is a 3 x 2 cm infarct along the left parieto-occipital sulcus which is of indeterminate age but may be subacute. Small age indeterminate but possibly recent  infarcts are also questioned in the right cerebellar hemisphere. There is also a small age indeterminate infarct laterally in the left cerebellar hemisphere. No acute intracranial hemorrhage, mass, midline shift, or extra-axial fluid collection is identified. Cerebral volume is within normal limits for age. The ventricles are normal in size. Cerebral white matter hypo stops that hypodensities elsewhere in the cerebral white matter bilaterally are nonspecific but compatible with mild chronic small vessel ischemic disease. Vascular: Calcified atherosclerosis at the skull base. No hyperdense vessel. Skull: No acute fracture or suspicious lesion. Sinuses/Orbits: Small volume secretions in the left maxillary sinus. Clear mastoid air cells. Bilateral cataract extraction. Other: None. IMPRESSION: 1. Small to moderate-sized, potentially subacute left parieto-occipital infarct. 2. Small age indeterminate cerebellar infarcts. 3. Mild chronic small vessel ischemic disease. Electronically Signed   By: Sebastian Ache M.D.   On: 04/26/2023 14:30   DG Chest Portable 1 View Result Date: 04/26/2023 CLINICAL DATA:  Shortness of breath. EXAM: PORTABLE CHEST 1 VIEW COMPARISON:  August 31, 2022. FINDINGS: Stable cardiomediastinal silhouette. Left-sided defibrillator is again noted. Lungs are clear. Bony thorax is unremarkable. IMPRESSION: No active disease. Electronically Signed   By: Lupita Raider M.D.   On: 04/26/2023 13:15       LOS: 1 day   [employer]  [facility] Pager on [email or web address]  04/27/2023, 10:49 AM

## 2023-04-27 NOTE — Inpatient Diabetes Management (Addendum)
 Inpatient Diabetes Program Recommendations  AACE/ADA: New Consensus Statement on Inpatient Glycemic Control (2015)  Target Ranges:  Prepandial:   less than 140 mg/dL      Peak postprandial:   less than 180 mg/dL (1-2 hours)      Critically ill patients:  140 - 180 mg/dL   Lab Results  Component Value Date   GLUCAP 164 (H) 04/27/2023   HGBA1C 10.3 (H) 04/27/2023    Latest Reference Range & Units 04/26/23 12:08 04/26/23 17:04 04/26/23 21:41 04/27/23 06:18  Glucose-Capillary 70 - 99 mg/dL 119 (H) 147 (H) Novolog 5 units 267 (H) Novolog 3 units 164 (H) Novolog 2 units  (H): Data is abnormally high  Diabetes history: DM2 Outpatient Diabetes medications: Toujeo 24 units daily, Humalog 4-8 units tid meal coverage, Jardiance 10 mg daily Current orders for Inpatient glycemic control: Lantus 8 units daily, Novolog 0-9 units tid, 0-5 units hs  Inpatient Diabetes Program Recommendations:   Noted A1c 10.3. Living Well With Diabetes booklet ordered. Will plan to see patient during hospitalization.  2:30 pm Spoke with patient and daughter regarding A1c of 10.3 (average blood glucose 249 over the past 2-3 months). Patient has only been taking her Novolog meal coverage if CBG >150 before meals, so discussed purpose of meal coverage to decrease rise of CBGs with meals. Reviewed caution of not taking Jardiance when she is not able to eat or drink.  Thank you, Billy Fischer. Burr Soffer, RN, MSN, CDCES  Diabetes Coordinator Inpatient Glycemic Control Team Team Pager [phone] (8am-5pm) 04/27/2023 11:39 AM

## 2023-04-27 NOTE — Consult Note (Signed)
 Palliative Care Consult Note                                  Date: 04/27/2023   Patient Name: Emily [name]  DOB: [birth year]  MRN: [id number]  Age / Sex: 73 y.o., female  PCP: Glendale Chard, DO Referring Physician: Osvaldo Shipper, MD  Reason for Consultation: Establishing goals of care  HPI/Patient Profile: 73 y.o. female  with past medical history of chronic HFrEF status post ICD placement, CKD stage 4, permanent atrial fibrillation, insulin-dependent diabetes mellitus, hypothyroidism, HTN, and anemia.  She was in her usual state of health until about a month ago when she started feeling nauseous her oral intake has been poor.  She has lost about 10 pounds in the last month. She had labs done at her outpatient nephrologist office, and was sent to the ED due to worsening kidney function. She was admitted on 04/26/2023 with AKI on CKD stage 4 and severe hyponatremia.   Palliative Medicine was consulted for goals of care discussions in the setting of "not an ideal dialysis candidate".   Subjective:   Extensive chart review has been completed including labs, vital signs, imaging, progress/consult notes, orders, medications and available advance directive documents.   I met with patient and her daughter/Tina to discuss diagnosis, prognosis, and GOC.  I introduced Palliative Medicine as specialized medical care for people living with serious illness. It focuses on providing relief from the symptoms and stress of a serious illness.   Created space and opportunity for patient and family to express thoughts and feelings regarding current medical situation. Values and goals of care were attempted to be elicited.  Life Review: Patient and her husband/Larry have been married for many years. They have 1 daughter Inetta Fermo) and 1 grandson.  Her husband is disabled. Patient is of [employer] faith and going to church has been a very important part of her  life.   Functional Status: Patient lives in [place], [place] with her husband and daughter. She reports her functional status has declined since early January. She is ambulatory, but has become progressively weaker. She has also had several recent falls.   GOC Discussion: We discussed patient's current medical situation and what it means in the larger context of her ongoing co-morbidities. Current clinical status was reviewed.   We reviewed patient's issues of heart failure, kidney failure, A-fib, and stroke. We reviewed the natural trajectory of heart failure as a progressive and ultimately life-limiting illness.   Patient and daughter understand she may be facing a decision about dialysis if kidney function continues to worsen. Discussed concerns for dialysis in the setting of heart failure, decreased functional status/weakness, and hypotension.  Education offered on the limitations of medical interventions to prolong quality of life when the body fails to thrive. Patient seems to indicate that she would not want life-prolonging interventions if her current functional state does not improve.   We briefly discussed that if patient does not wish to start dialysis (if indicated), then recommendation would be for hospice services.  We also discussed that if she decides to trial dialysis, then she can also decide to  stop dialysis at any time if things are not going well or her quality of life becomes unacceptable.  Discussed plan to continue current supportive interventions while awaiting echo and CT results, as well as recommendations from cardiology.       Review of Systems  Neurological:  Positive for weakness.    Objective:   Primary Diagnoses: Present on Admission:  ICD (implantable cardioverter-defibrillator) in place  CKD (chronic kidney disease) stage 4, GFR 15-29 ml/min (HCC)  Type 2 diabetes mellitus with diabetic chronic kidney disease (HCC)  AKI (acute kidney injury) (HCC)   Hyponatremia   Physical Exam Vitals reviewed.  Constitutional:      General: She is not in acute distress.    Comments: Frail appearing  Pulmonary:     Effort: Pulmonary effort is normal.  Neurological:     Mental Status: She is alert and oriented to person, place, and time.     Motor: Weakness present.    Palliative Assessment/Data: PPS 40-50%     Assessment & Plan:   SUMMARY OF RECOMMENDATIONS   Continue current supportive interventions Patient is unsure if she would want to start dialysis Will await echo and CT results, as well as recommendations from cardiology I will follow-up tomorrow  Primary Decision Maker: PATIENT  Code Status/Advance Care Planning: DNR  Prognosis:  Unable to determine  Discharge Planning:  To Be Determined    Thank you for allowing [place] to participate in the care of [name]   Time Total: 78 minutes  Detailed review of medical records (labs, imaging, vital signs), medically appropriate exam, discussed with treatment team, counseling and education to patient, family, & staff, documenting clinical information., coordination of care.   Signed by: Sherlean Foot, NP Palliative Medicine Team  Team Phone # [phone]  For individual providers, please see AMION

## 2023-04-28 DIAGNOSIS — E871 Hypo-osmolality and hyponatremia: Secondary | ICD-10-CM | POA: Diagnosis not present

## 2023-04-28 DIAGNOSIS — N179 Acute kidney failure, unspecified: Secondary | ICD-10-CM | POA: Diagnosis not present

## 2023-04-28 DIAGNOSIS — I5022 Chronic systolic (congestive) heart failure: Secondary | ICD-10-CM | POA: Diagnosis not present

## 2023-04-28 LAB — GLUCOSE, CAPILLARY
Glucose-Capillary: 162 mg/dL — ABNORMAL HIGH (ref 70–99)
Glucose-Capillary: 235 mg/dL — ABNORMAL HIGH (ref 70–99)
Glucose-Capillary: 254 mg/dL — ABNORMAL HIGH (ref 70–99)
Glucose-Capillary: 253 mg/dL — ABNORMAL HIGH (ref 70–99)
Glucose-Capillary: 242 mg/dL — ABNORMAL HIGH (ref 70–99)

## 2023-04-28 LAB — PROTIME-INR
INR: 2.4 — ABNORMAL HIGH (ref 0.8–1.2)
Prothrombin Time: 26.4 s — ABNORMAL HIGH (ref 11.4–15.2)

## 2023-04-28 LAB — CBC
HCT: 28.4 % — ABNORMAL LOW (ref 36.0–46.0)
Hemoglobin: 9.3 g/dL — ABNORMAL LOW (ref 12.0–15.0)
MCH: 27.4 pg (ref 26.0–34.0)
MCHC: 32.7 g/dL (ref 30.0–36.0)
MCV: 83.8 fL (ref 80.0–100.0)
Platelets: 234 10*3/uL (ref 150–400)
RBC: 3.39 MIL/uL — ABNORMAL LOW (ref 3.87–5.11)
RDW: 18.6 % — ABNORMAL HIGH (ref 11.5–15.5)
WBC: 7.3 10*3/uL (ref 4.0–10.5)
nRBC: 0 % (ref 0.0–0.2)

## 2023-04-28 LAB — IRON AND TIBC
Iron: 400 ug/dL — ABNORMAL HIGH (ref 28–170)
Saturation Ratios: 104 % — ABNORMAL HIGH (ref 10.4–31.8)
TIBC: 385 ug/dL (ref 250–450)

## 2023-04-28 LAB — BASIC METABOLIC PANEL
Anion gap: 15 (ref 5–15)
BUN: 127 mg/dL — ABNORMAL HIGH (ref 8–23)
CO2: 20 mmol/L — ABNORMAL LOW (ref 22–32)
Calcium: 8 mg/dL — ABNORMAL LOW (ref 8.9–10.3)
Chloride: 93 mmol/L — ABNORMAL LOW (ref 98–111)
Creatinine, Ser: 2.95 mg/dL — ABNORMAL HIGH (ref 0.44–1.00)
GFR, Estimated: 16 mL/min — ABNORMAL LOW (ref >60)
Glucose, Bld: 160 mg/dL — ABNORMAL HIGH (ref 70–99)
Potassium: 3.5 mmol/L (ref 3.5–5.1)
Sodium: 128 mmol/L — ABNORMAL LOW (ref 135–145)

## 2023-04-28 LAB — FERRITIN: Ferritin: 215 ng/mL (ref 11–307)

## 2023-04-28 MED ORDER — WARFARIN SODIUM 3 MG PO TABS
3.0000 mg | ORAL_TABLET | Freq: Once | ORAL | Status: AC
  Administered 2023-04-28: 3 mg via ORAL
  Filled 2023-04-28: qty 1

## 2023-04-28 MED ORDER — WARFARIN SODIUM 3 MG PO TABS: 3.0000 mg | ORAL_TABLET | Freq: Once | ORAL | Status: DC

## 2023-04-28 NOTE — Progress Notes (Addendum)
 Palliative Medicine Progress Note   Patient Name: Emily [name]       Date: 04/28/2023 DOB: 03-May-1950  Age: 73 y.o. MRN#: [id number] Attending Physician: Osvaldo Shipper, MD Primary Care Physician: Glendale Chard, DO Admit Date: 04/26/2023   HPI/Patient Profile: 73 y.o. female  with past medical history of chronic HFrEF status post ICD placement, CKD stage 4, permanent atrial fibrillation, insulin-dependent diabetes mellitus, hypothyroidism, HTN, and anemia.  She was in her usual state of health until about a month ago when she started feeling nauseous her oral intake has been poor.  She has lost about 10 pounds in the last month. She had labs done at her outpatient nephrologist office, and was sent to the ED due to worsening kidney function. She was admitted on 04/26/2023 with AKI on CKD stage 4 and severe hyponatremia.    Palliative Medicine was consulted for goals of care discussions in the setting of "not an ideal dialysis candidate".   Subjective: Chart reviewed including labs, imaging, and progress/consult notes. Creatinine is down to 2.95 today (peak creatinine 4.95).   I met with patient and daughter at bedside. She reports feeling better compared to yesterday.   Dr. Thedore Mins stops by and explains there is no urgent indication for dialysis, as creatinine has trended down and patient is clinically doing better.  Patient does express that she would likely not want to do dialysis even if was indicated. She feels it would be difficult to tolerate.   Discussed with patient and daughter recommendation for outpatient palliative for ongoing support around goals of care.  Discussed option to transition to hospice and avoid rehospitalization if/when kidney function worsens again in the future.  They are  agreeable to outpatient palliative referral.   Addendum 05/09/23: Referral made to outpatient palliative at [facility] ([facility]).     Objective:  Physical Exam Vitals reviewed.  Constitutional:      General: She is not in acute distress.    Comments: Frail, chronically ill-appearing  Pulmonary:     Effort: Pulmonary effort is normal.  Neurological:     Mental Status: She is alert and oriented to person, place, and time.     Motor: Weakness present.              Palliative Medicine Assessment & Plan   Assessment: Principal  Problem:   AKI (acute kidney injury) (HCC) Active Problems:   ICD (implantable cardioverter-defibrillator) in place   CKD (chronic kidney disease) stage 4, GFR 15-29 ml/min (HCC)   Type 2 diabetes mellitus with diabetic chronic kidney disease (HCC)   Hyponatremia   Chronic systolic CHF (congestive heart failure) (HCC)    Recommendations/Plan: Continue current supportive interventions Patient has decided she would likely not want dialysis if indicated Outpatient palliative referral   Code Status: DNR/DNI   Prognosis:  Unable to determine  Discharge Planning: Home with Home Health    Thank you for allowing the Palliative Medicine Team to assist in the care of this patient.   Time: 38 minutes   Merry Proud, NP   Please contact Palliative Medicine Team phone at [phone] for questions and concerns.  For individual providers, please see AMION.

## 2023-04-28 NOTE — Progress Notes (Signed)
  [facility] Progress Note    Assessment/ Plan:   AKI on CKD 4 -followed at our [place] satellite office. Likely prerenal injury in the context of profound hyperglycemia, low PO intake. -UA pending, CT abd/pel pending -continue to hold home diuretics -Peak creatinine 4.95.  Down to 2.95 today.  BUN trending down as well, down to 127. -fluids: Does not look as dry today.  Can hold off on IV fluids.  Encourage p.o. hydration. -renal replacement therapy indications: no urgent indications especially as she is clinically doing better and her kidney function is improving.  I am concerned about her tolerating hemodialysis in the long run given her functional status and underlying HFrEF. She may be a candidate for PD in the long run but again, her functional status may be challenging for this and they did report to me that she won't be able to lay flat for PD.  She does express that she may not want to do dialysis altogether.  Appreciate palliative care's assistance -Avoid nephrotoxic medications including NSAIDs and iodinated intravenous contrast exposure unless the latter is absolutely indicated.  Preferred narcotic agents for pain control are hydromorphone, fentanyl, and methadone. Morphine should not be used. Avoid Baclofen and avoid oral sodium phosphate and magnesium citrate based laxatives / bowel preps. Continue strict Input and Output monitoring. Will monitor the patient closely with you and intervene or adjust therapy as indicated by changes in clinical status/labs    Severe Hyponatremia -suspecting this is hypovolemic in nature given exam and history -improving, up to 128.  Monitor for now  Subacute CVA -per primary   Acidosis -secondary to AKI, monitor for now, supplement once bicarb <16   HFrEF -Euvolemic on exam -diuretics on hold as above.  Continue to hold   Anemia of CKD -transfuse PRN   Uncontrolled Diabetes Mellitus Type 2 with Hyperglycemia -per  primary  Hypokalemia -Improved, watching for now  Discussed with primary service and palliative care.  Subjective:   Patient seen and examined bedside.  Daughter at bedside.  She reports that she is urinating more, appetite is great, no longer having any presyncope/dizziness, nausea/vomiting.   Objective:   BP 103/60 (BP Location: Right Arm)   Pulse 84   Temp 98.4 F (36.9 C)   Resp 17   Ht 5\' 2"  (1.575 m)   Wt 54.9 kg   SpO2 100%   BMI 22.13 kg/m   Intake/Output Summary (Last 24 hours) at 04/28/2023 1149 Last data filed at 04/28/2023 0600 Gross per 24 hour  Intake 1180.83 ml  Output --  Net 1180.83 ml   Weight change:   Physical Exam: Gen: NAD, chronically ill appearing CVS: RRR +systolic mumur Resp: CTA B/L Abd: soft  Ext: no edema Neuro: awake, alert  Imaging: VAS US CAROTID Result Date: 04/28/2023 Carotid Arterial Duplex Study Patient Name:  Emily [name]  Date of Exam:   04/27/2023 Medical Rec #: [id number]        Accession #:    [id number] Date of Birth: 01/20/1951        Patient Gender: F Patient Age:   73 years Exam Location:  [facility] Procedure:      VAS US CAROTID Referring Phys: Osvaldo Shipper --------------------------------------------------------------------------------  Indications:       Subacute CVA. Risk Factors:      Hypertension, Diabetes. Other Factors:     Atrial fibrillation, CKD IV, CHF. Comparison Study:  No prior carotid duplex on file Performing Technologist: Sherren Kerns RVS  Examination  Guidelines: A complete evaluation includes B-mode imaging, spectral Doppler, color Doppler, and power Doppler as needed of all accessible portions of each vessel. Bilateral testing is considered an integral part of a complete examination. Limited examinations for reoccurring indications may be performed as noted.  Right Carotid Findings: +----------+--------+--------+--------+------------------+------------------+           PSV cm/sEDV  cm/sStenosisPlaque DescriptionComments           +----------+--------+--------+--------+------------------+------------------+ CCA Prox  83      11                                intimal thickening +----------+--------+--------+--------+------------------+------------------+ CCA Distal87      12                                intimal thickening +----------+--------+--------+--------+------------------+------------------+ ICA Prox  97      21      1-39%   calcific                             +----------+--------+--------+--------+------------------+------------------+ ICA Mid   98      18                                                   +----------+--------+--------+--------+------------------+------------------+ ICA Distal155     33              heterogenous                         +----------+--------+--------+--------+------------------+------------------+ ECA       157     6               calcific                             +----------+--------+--------+--------+------------------+------------------+ +----------+--------+-------+--------+-------------------+           PSV cm/sEDV cmsDescribeArm Pressure (mmHG) +----------+--------+-------+--------+-------------------+ [id number]                                         +----------+--------+-------+--------+-------------------+ +---------+--------+--+--------+--+ VertebralPSV cm/s62EDV cm/s10 +---------+--------+--+--------+--+  Left Carotid Findings: +----------+--------+--------+--------+------------------+------------------+           PSV cm/sEDV cm/sStenosisPlaque DescriptionComments           +----------+--------+--------+--------+------------------+------------------+ CCA Prox  104     14                                intimal thickening +----------+--------+--------+--------+------------------+------------------+ CCA Distal98      12                                intimal  thickening +----------+--------+--------+--------+------------------+------------------+ ICA Prox  131     22      1-39%   calcific          Shadowing          +----------+--------+--------+--------+------------------+------------------+ ICA Mid   137  18                                                   +----------+--------+--------+--------+------------------+------------------+ ICA Distal139     26              heterogenous                         +----------+--------+--------+--------+------------------+------------------+ ECA       176     2               calcific                             +----------+--------+--------+--------+------------------+------------------+ +----------+--------+--------+---------+-------------------+           PSV cm/sEDV cm/sDescribe Arm Pressure (mmHG) +----------+--------+--------+---------+-------------------+ [id number]             Turbulent                    +----------+--------+--------+---------+-------------------+ +---------+--------+--------+------------+ VertebralPSV cm/sEDV cm/sNot assessed +---------+--------+--------+------------+   Summary: Right Carotid: Velocities in the right ICA are consistent with a 1-39% stenosis. Left Carotid: Velocities in the left ICA are consistent with a 1-39% stenosis. Vertebrals:  Right vertebral artery demonstrates antegrade flow. Left not              assessed. Subclavians: Left subclavian artery flow was disturbed. Normal flow hemodynamics              were seen in the right subclavian artery. *See table(s) above for measurements and observations.  Electronically signed by Carolynn Sayers on 04/28/2023 at 7:19:22 AM.    Final    ECHOCARDIOGRAM COMPLETE Result Date: 04/27/2023    ECHOCARDIOGRAM REPORT   Patient Name:   TALAYIA HJORT Date of Exam: 04/27/2023 Medical Rec #:  [id number]       Height:       62.0 in Accession #:    [id number]      Weight:       121.0 lb Date of Birth:   [birth year]       BSA:          1.544 m Patient Age:    72 years        BP:           120/59 mmHg Patient Gender: F               HR:           80 bpm. Exam Location:  Inpatient Procedure: 2D Echo, Cardiac Doppler, Color Doppler and Intracardiac            Opacification Agent (Both Spectral and Color Flow Doppler were            utilized during procedure). Indications:    Stroke I63.9  History:        Patient has prior history of Echocardiogram examinations, most                 recent 04/28/2022. CHF and Cardiomyopathy, Stroke, Mitral Valve                 Disease; Risk Factors:Hypertension and Diabetes.  Sonographer:    Webb Laws Referring Phys: [id number] GOKUL KRISHNAN IMPRESSIONS  1. Left ventricular ejection fraction, by  estimation, is 35 to 40%. The left ventricle has moderately decreased function. The left ventricle demonstrates global hypokinesis. The left ventricular internal cavity size was dilated. Left ventricular diastolic parameters are indeterminate.  2. Right ventricular systolic function is normal. The right ventricular size is normal.  3. The mitral valve is degenerative. Mild to moderate mitral valve regurgitation.  4. Decreased stroke volume index, DVI 0.34. Likely a degree of low flow low gradient aortic stenosis. The aortic valve was not well visualized. Aortic valve regurgitation is not visualized. Mild to moderate aortic valve stenosis.  5. The inferior vena cava is dilated in size with >50% respiratory variability, suggesting right atrial pressure of 8 mmHg. Comparison(s): No significant change from prior study. Prior images reviewed side by side. FINDINGS  Left Ventricle: Left ventricular ejection fraction, by estimation, is 35 to 40%. The left ventricle has moderately decreased function. The left ventricle demonstrates global hypokinesis. Definity contrast agent was given IV to delineate the left ventricular endocardial borders. Strain imaging was not performed. The left ventricular  internal cavity size was dilated. There is no left ventricular hypertrophy. Left ventricular diastolic parameters are indeterminate. Right Ventricle: The right ventricular size is normal. No increase in right ventricular wall thickness. Right ventricular systolic function is normal. Left Atrium: Left atrial size was normal in size. Right Atrium: Right atrial size was normal in size. Pericardium: There is no evidence of pericardial effusion. Mitral Valve: The mitral valve is degenerative in appearance. Mild to moderate mitral valve regurgitation. Tricuspid Valve: The tricuspid valve is normal in structure. Tricuspid valve regurgitation is trivial. No evidence of tricuspid stenosis. Aortic Valve: Decreased stroke volume index, DVI 0.34. Likely a degree of low flow low gradient aortic stenosis. The aortic valve was not well visualized. Aortic valve regurgitation is not visualized. Mild to moderate aortic stenosis is present. Aortic valve mean gradient measures 13.2 mmHg. Aortic valve peak gradient measures 22.7 mmHg. Aortic valve area, by VTI measures 0.67 cm. Pulmonic Valve: The pulmonic valve was normal in structure. Pulmonic valve regurgitation is not visualized. No evidence of pulmonic stenosis. Aorta: The aortic root is normal in size and structure. Venous: The inferior vena cava is dilated in size with greater than 50% respiratory variability, suggesting right atrial pressure of 8 mmHg. IAS/Shunts: The interatrial septum was not well visualized. Additional Comments: 3D imaging was not performed. A device lead is visualized in the right ventricle and right atrium.  LEFT VENTRICLE PLAX 2D LVIDd:         5.30 cm      Diastology LVIDs:         4.90 cm      LV e' medial:    5.22 cm/s LV PW:         0.90 cm      LV E/e' medial:  25.9 LV IVS:        1.00 cm      LV e' lateral:   7.29 cm/s LVOT diam:     1.60 cm      LV E/e' lateral: 18.5 LV SV:         37 LV SV Index:   24 LVOT Area:     2.01 cm  LV Volumes (MOD) LV  vol d, MOD A2C: 90.8 ml LV vol d, MOD A4C: 100.0 ml LV vol s, MOD A2C: 62.7 ml LV vol s, MOD A4C: 56.1 ml LV SV MOD A2C:     28.1 ml LV SV MOD A4C:  100.0 ml LV SV MOD BP:      38.9 ml RIGHT VENTRICLE            IVC RV Basal diam:  3.40 cm    IVC diam: 2.20 cm RV S prime:     6.42 cm/s TAPSE (M-mode): 1.8 cm LEFT ATRIUM             Index        RIGHT ATRIUM           Index LA diam:        3.70 cm 2.40 cm/m   RA Area:     13.70 cm LA Vol (A2C):   23.7 ml 15.35 ml/m  RA Volume:   31.70 ml  20.53 ml/m LA Vol (A4C):   45.2 ml 29.27 ml/m LA Biplane Vol: 34.8 ml 22.54 ml/m  AORTIC VALVE AV Area (Vmax):    0.77 cm AV Area (Vmean):   0.81 cm AV Area (VTI):     0.67 cm AV Vmax:           238.25 cm/s AV Vmean:          170.750 cm/s AV VTI:            0.546 m AV Peak Grad:      22.7 mmHg AV Mean Grad:      13.2 mmHg LVOT Vmax:         90.70 cm/s LVOT Vmean:        68.600 cm/s LVOT VTI:          0.183 m LVOT/AV VTI ratio: 0.34  AORTA Ao Asc diam: 2.30 cm MITRAL VALVE                  TRICUSPID VALVE MV Area (PHT): 3.76 cm       TR Peak grad:   21.3 mmHg MV Decel Time: 202 msec       TR Vmax:        231.00 cm/s MR Peak grad:    83.9 mmHg MR Mean grad:    49.0 mmHg    SHUNTS MR Vmax:         458.00 cm/s  Systemic VTI:  0.18 m MR Vmean:        330.0 cm/s   Systemic Diam: 1.60 cm MR PISA:         1.01 cm MR PISA Eff ROA: 7 mm MR PISA Radius:  0.40 cm MV E velocity: 135.00 cm/s Riley Lam MD Electronically signed by Riley Lam MD Signature Date/Time: 04/27/2023/12:54:19 PM    Final    CT HEAD WO CONTRAST ( ) Result Date: 04/27/2023 CLINICAL DATA:  Stroke follow-up EXAM: CT HEAD WITHOUT CONTRAST TECHNIQUE: Contiguous axial images were obtained from the base of the skull through the vertex without intravenous contrast. RADIATION DOSE REDUCTION: This exam was performed according to the departmental dose-optimization program which includes automated exposure control, adjustment of the mA and/or kV  according to patient size and/or use of iterative reconstruction technique. COMPARISON:  CT head 04/26/2023 FINDINGS: Brain: Similar appearance of hypoattenuation in the left parieto-occipital lobes suggestive of possible subacute infarct. No evidence of hemorrhagic conversion. No additional areas of infarct appreciated. Nonspecific hypoattenuation in the periventricular and subcortical white matter favored to reflect chronic microvascular ischemic changes. No edema, mass effect, or midline shift. The basilar cisterns are patent. Ventricles: The ventricles are normal. Vascular: Atherosclerotic calcifications of the carotid siphons and intracranial vertebral arteries. No hyperdense vessel. Skull: No acute  or aggressive finding. Orbits: Orbits are symmetric. Sinuses: The visualized paranasal sinuses are clear. Other: Mastoid air cells are clear. IMPRESSION: Similar appearance of probably subacute infarct in the left parietooccipital lobes. No evidence of hemorrhagic conversion. Electronically Signed   By: Emily Filbert M.D.   On: 04/27/2023 11:00   CT ABDOMEN PELVIS WO CONTRAST Result Date: 04/26/2023 CLINICAL DATA:  Abdominal pain, weight loss, acute kidney injury EXAM: CT ABDOMEN AND PELVIS WITHOUT CONTRAST TECHNIQUE: Multidetector CT imaging of the abdomen and pelvis was performed following the standard protocol without IV contrast. RADIATION DOSE REDUCTION: This exam was performed according to the departmental dose-optimization program which includes automated exposure control, adjustment of the mA and/or kV according to patient size and/or use of iterative reconstruction technique. COMPARISON:  None Available. FINDINGS: Lower chest: No acute abnormality.  ICD wires in the right heart. Hepatobiliary: No focal liver abnormality is seen. Status post cholecystectomy. No biliary dilatation. Pancreas: No focal abnormality or ductal dilatation. Spleen: No focal abnormality.  Normal size. Adrenals/Urinary Tract:  Renovascular calcifications within the hila and kidneys bilaterally. No visible urinary tract stones. No hydronephrosis. No renal or adrenal mass. Urinary bladder unremarkable. Stomach/Bowel: Stomach, large and small bowel grossly unremarkable. Vascular/Lymphatic: Diffuse vascular calcifications. No aneurysm or adenopathy. Reproductive: Prior hysterectomy.  No adnexal masses. Other: No free fluid or free air. Musculoskeletal: No acute bony abnormality IMPRESSION: No acute findings in the abdomen or pelvis. Diffuse aortic/arterial atherosclerosis. Electronically Signed   By: Charlett Nose M.D.   On: 04/26/2023 21:29   DG Lumbar Spine Complete Result Date: 04/26/2023 CLINICAL DATA:  Fall.  Low back pain. EXAM: LUMBAR SPINE - COMPLETE 4+ VIEW; SACRUM AND COCCYX - 2+ VIEW COMPARISON:  None Available. FINDINGS: There are 5 nonrib-bearing lumbar vertebrae. There is mild loss of lumbar lordosis, which may be on the basis of positioning or due to muscle spasm. No spondylolysis. There is grade 1 anterolisthesis of L3 over L4, most likely degenerative. Vertebral body heights are maintained. No aggressive osseous lesion. Mild multilevel degenerative changes in the form of facet arthropathy and marginal osteophyte formation. Visualized soft tissues are within normal limits. Pelvis is intact with normal and symmetric sacroiliac joints. No acute fracture or dislocation. No aggressive osseous lesion. Visualized sacral arcuate lines are unremarkable. Unremarkable symphysis pubis. There are mild degenerative changes of bilateral hip joints without significant joint space narrowing. Osteophytosis of the superior acetabulum. No radiopaque foreign bodies. IMPRESSION: 1. No acute osseous abnormality of the lumbar spine or sacrum/coccyx. 2. Mild multilevel degenerative changes of the lumbar spine. 3. Mild degenerative changes of bilateral hip joints. Electronically Signed   By: Jules Schick M.D.   On: 04/26/2023 15:25   DG  Sacrum/Coccyx Result Date: 04/26/2023 CLINICAL DATA:  Fall.  Low back pain. EXAM: LUMBAR SPINE - COMPLETE 4+ VIEW; SACRUM AND COCCYX - 2+ VIEW COMPARISON:  None Available. FINDINGS: There are 5 nonrib-bearing lumbar vertebrae. There is mild loss of lumbar lordosis, which may be on the basis of positioning or due to muscle spasm. No spondylolysis. There is grade 1 anterolisthesis of L3 over L4, most likely degenerative. Vertebral body heights are maintained. No aggressive osseous lesion. Mild multilevel degenerative changes in the form of facet arthropathy and marginal osteophyte formation. Visualized soft tissues are within normal limits. Pelvis is intact with normal and symmetric sacroiliac joints. No acute fracture or dislocation. No aggressive osseous lesion. Visualized sacral arcuate lines are unremarkable. Unremarkable symphysis pubis. There are mild degenerative changes of bilateral hip joints without  significant joint space narrowing. Osteophytosis of the superior acetabulum. No radiopaque foreign bodies. IMPRESSION: 1. No acute osseous abnormality of the lumbar spine or sacrum/coccyx. 2. Mild multilevel degenerative changes of the lumbar spine. 3. Mild degenerative changes of bilateral hip joints. Electronically Signed   By: Jules Schick M.D.   On: 04/26/2023 15:25   CT Head Wo Contrast Result Date: 04/26/2023 CLINICAL DATA:  Polytrauma, blunt.  Dizziness and weakness. EXAM: CT HEAD WITHOUT CONTRAST TECHNIQUE: Contiguous axial images were obtained from the base of the skull through the vertex without intravenous contrast. RADIATION DOSE REDUCTION: This exam was performed according to the departmental dose-optimization program which includes automated exposure control, adjustment of the mA and/or kV according to patient size and/or use of iterative reconstruction technique. COMPARISON:  None Available. FINDINGS: Brain: There is a 3 x 2 cm infarct along the left parieto-occipital sulcus which is of  indeterminate age but may be subacute. Small age indeterminate but possibly recent infarcts are also questioned in the right cerebellar hemisphere. There is also a small age indeterminate infarct laterally in the left cerebellar hemisphere. No acute intracranial hemorrhage, mass, midline shift, or extra-axial fluid collection is identified. Cerebral volume is within normal limits for age. The ventricles are normal in size. Cerebral white matter hypo stops that hypodensities elsewhere in the cerebral white matter bilaterally are nonspecific but compatible with mild chronic small vessel ischemic disease. Vascular: Calcified atherosclerosis at the skull base. No hyperdense vessel. Skull: No acute fracture or suspicious lesion. Sinuses/Orbits: Small volume secretions in the left maxillary sinus. Clear mastoid air cells. Bilateral cataract extraction. Other: None. IMPRESSION: 1. Small to moderate-sized, potentially subacute left parieto-occipital infarct. 2. Small age indeterminate cerebellar infarcts. 3. Mild chronic small vessel ischemic disease. Electronically Signed   By: Sebastian Ache M.D.   On: 04/26/2023 14:30   DG Chest Portable 1 View Result Date: 04/26/2023 CLINICAL DATA:  Shortness of breath. EXAM: PORTABLE CHEST 1 VIEW COMPARISON:  August 31, 2022. FINDINGS: Stable cardiomediastinal silhouette. Left-sided defibrillator is again noted. Lungs are clear. Bony thorax is unremarkable. IMPRESSION: No active disease. Electronically Signed   By: Lupita Raider M.D.   On: 04/26/2023 13:15    Labs: BMET Recent Labs  Lab 04/26/23 1134 04/26/23 2009 04/27/23 0600 04/28/23 0641  NA 116* 122* 124* 128*  K 3.4* 3.1* 3.2* 3.5  CL 74* 81* 85* 93*  CO2 18* 22 21* 20*  GLUCOSE 389* 278* 141* 160*  BUN 147* 142* 143* 127*  CREATININE 4.95* 4.78* 4.36* 2.95*  CALCIUM 7.8* 7.6* 7.6* 8.0*  PHOS  --  6.8*  --   --    CBC Recent Labs  Lab 04/26/23 1134 04/27/23 0600  WBC 9.0 8.3  NEUTROABS 7.0  --   HGB  11.0* 9.2*  HCT 32.7* 27.4*  MCV 80.0 81.8  PLT 309 240    Medications:     insulin aspart  0-5 Units Subcutaneous QHS   insulin aspart  0-9 Units Subcutaneous TID WC   insulin glargine  8 Units Subcutaneous QHS   levothyroxine  88 mcg Oral QAC breakfast   living well with diabetes book   Does not apply Once   senna-docusate  2 tablet Oral BID   warfarin  3 mg Oral ONCE-1600   Warfarin - Pharmacist Dosing Inpatient   Does not apply [id number]      Anthony Sar, MD [facility] 04/28/2023, 11:49 AM

## 2023-04-28 NOTE — Evaluation (Signed)
 Occupational Therapy Evaluation Patient Details Name: Emily [name] MRN: [id number] DOB: 1950/08/04 Today's Date: 04/28/2023   History of Present Illness   Pt is a 73 y/o female admitted for AKI on CKD and hyponatremia. Head CT with incidental showing of subacute L parietoccipital lobes. PMH: DM2, a fib, CHF, CKD, HLD, hypothyroidism, ICD     Clinical Impressions PTA, pt lives with family and typically Independent with ADLs, IADLs and mobility without AD. Pt presents now with minor deficits in dynamic standing balance and back pain (from recent fall). Overall, pt moving well, able to mobilize in hallway and manage ADLs without physical assistance. Encouraged use of RW vs cane for mobility if reaching out to furniture for support at home; also recommend [facility] for use at home to decrease fall risk due to reports of frequent urination. [name] daughter at bedside, able to provide support at home as needed. Will follow acutely but anticipate no OT needs at DC.     If plan is discharge home, recommend the following:   Assistance with cooking/housework;Help with stairs or ramp for entrance     Functional Status Assessment   Patient has had a recent decline in their functional status and demonstrates the ability to make significant improvements in function in a reasonable and predictable amount of time.     Equipment Recommendations   BSC/3in1     Recommendations for Other Services         Precautions/Restrictions   Precautions Precautions: Fall Recall of Precautions/Restrictions: Intact Restrictions Weight Bearing Restrictions Per Provider Order: No     Mobility Bed Mobility               General bed mobility comments: in recliner on entry    Transfers Overall transfer level: Independent Equipment used: None                      Balance Overall balance assessment: Mild deficits observed, not formally tested                                          ADL either performed or assessed with clinical judgement   ADL Overall ADL's : Needs assistance/impaired Eating/Feeding: Independent   Grooming: Set up;Standing   Upper Body Bathing: Set up;Sitting   Lower Body Bathing: Supervison/ safety;Sitting/lateral leans;Sit to/from stand   Upper Body Dressing : Set up;Sitting   Lower Body Dressing: Supervision/safety;Sit to/from stand;Sitting/lateral leans   Toilet Transfer: Contact guard Marine scientist Details (indicate cue type and reason): no AD; pt reports walking to/from bathroom with and without daughter assist here Toileting- Architect and Hygiene: Supervision/safety;Sit to/from stand;Sitting/lateral lean       Functional mobility during ADLs: Contact guard assist General ADL Comments: some limitations due to back pain w/ longer hallway mobility (provided handheld assist for support). Educated re: obtaining BSC for use at home as pt reports frequent urination/difficulties dealing with this, use of RW vs cane if noted to reach out for additional support vs to take pressure off of back. Discussed grab bars in shower if needed for safe transfers     Vision Ability to See in Adequate Light: 0 Adequate Patient Visual Report: No change from baseline Vision Assessment?: No apparent visual deficits     Perception         Praxis         Pertinent Vitals/Pain  Pain Assessment Pain Assessment: Faces Faces Pain Scale: Hurts a little bit Pain Location: low back Pain Descriptors / Indicators: Sore, Guarding, Grimacing Pain Intervention(s): Monitored during session     Extremity/Trunk Assessment Upper Extremity Assessment Upper Extremity Assessment: Overall WFL for tasks assessed;Right hand dominant   Lower Extremity Assessment Lower Extremity Assessment: Defer to PT evaluation   Cervical / Trunk Assessment Cervical / Trunk Assessment: Normal   Communication  Communication Communication: No apparent difficulties   Cognition Arousal: Alert Behavior During Therapy: WFL for tasks assessed/performed Cognition: No apparent impairments                               Following commands: Intact       Cueing  General Comments   Cueing Techniques: Verbal cues      Exercises     Shoulder Instructions      Home Living Family/patient expects to be discharged to:: Private residence Living Arrangements: Spouse/significant other;Children Available Help at Discharge: Family Type of Home: House Home Access: Stairs to enter Secretary/administrator of Steps: 3 Entrance Stairs-Rails: None Home Layout: One level     Bathroom Shower/Tub: Chief Strategy Officer: Standard     Home Equipment: Agricultural consultant (2 wheels);Rollator (4 wheels);Cane - quad;Cane - single point          Prior Functioning/Environment Prior Level of Function : Independent/Modified Independent;History of Falls (last six months)             Mobility Comments: no AD for mobility typically. one recent fall at home (husband attempted to assist pt up and he also fell) ADLs Comments: Indep with ADLs, daughter assists with IADLs as needed. pt enjoys going to church. Pt provides setup assist for husband for meals/sponge bathing    OT Problem List: Impaired balance (sitting and/or standing);Decreased activity tolerance;Pain   OT Treatment/Interventions: Self-care/ADL training;Therapeutic exercise;Energy conservation;DME and/or AE instruction;Therapeutic activities;Patient/family education;Balance training      OT Goals(Current goals can be found in the care plan section)   Acute Rehab OT Goals Patient Stated Goal: home soon OT Goal Formulation: With patient/family Time For Goal Achievement: 05/12/23 Potential to Achieve Goals: Good ADL Goals Additional ADL Goal #1: Pt to gather ADL/IADL items using least restrictive AD without LOB or safety  concerns Additional ADL Goal #2: Pt to verbalize at least 3 fall prevention strategies to implement at home   OT Frequency:  Min 1X/week    Co-evaluation              AM-PAC OT "6 Clicks" Daily Activity     Outcome Measure Help from another person eating meals?: None Help from another person taking care of personal grooming?: A Little Help from another person toileting, which includes using toliet, bedpan, or urinal?: A Little Help from another person bathing (including washing, rinsing, drying)?: A Little Help from another person to put on and taking off regular upper body clothing?: A Little Help from another person to put on and taking off regular lower body clothing?: A Little 6 Click Score: 19   End of Session Equipment Utilized During Treatment: Gait belt  Activity Tolerance: Patient tolerated treatment well Patient left: in chair;with call bell/phone within reach;with family/visitor present  OT Visit Diagnosis: Unsteadiness on feet (R26.81)                Time: 5784-6962 OT Time Calculation (min): 17 min Charges:  OT General Charges $OT  Visit: 1 Visit OT Evaluation $OT Eval Low Complexity: 1 Low  Bradd Canary, OTR/L Acute Rehab Services Office: [phone]   Lorre Munroe 04/28/2023, 2:23 PM

## 2023-04-28 NOTE — Progress Notes (Signed)
 PHARMACY - ANTICOAGULATION CONSULT NOTE  Pharmacy Consult for warfarin Indication: atrial fibrillation  Allergies  Allergen Reactions   Amiodarone Rash   Other Palpitations    PT STATES SHE GETS REALLY HOT , DIZZY HEADED, HAS PASSED OUT TWICE , AND ITCHING WITH IV DYE   Codeine Other (See Comments)    Euphoria, hallucinations   Turmeric     Pt went into AFIB    Colchicine Rash   Iodine Rash    topical   Ivp Dye [Iodinated Contrast Media] Rash   Mexitil [Mexiletine] Rash    Patient Measurements: Height: 5\' 2"  (157.5 cm) Weight: 54.9 kg (121 lb) IBW/kg (Calculated) : 50.1  Vital Signs: Temp: 98.4 F (36.9 C) (02/28 0728) BP: 103/60 (02/28 0728) Pulse Rate: 84 (02/28 0728)  Labs: Recent Labs    04/26/23 1134 04/26/23 1321 04/26/23 2009 04/27/23 0600 04/28/23 0641  HGB 11.0*  --   --  9.2*  --   HCT 32.7*  --   --  27.4*  --   PLT 309  --   --  240  --   LABPROT  --  49.9*  --  47.5* 26.4*  INR  --  5.5*  --  5.1* 2.4*  CREATININE 4.95*  --  4.78* 4.36* 2.95*    Estimated Creatinine Clearance: 13.2 mL/min (A) (by C-G formula based on SCr of 2.95 mg/dL (H)).   Medical History: Past Medical History:  Diagnosis Date   Acute pain of right shoulder 03/22/2019   Acute renal failure (HCC) 04/15/2012   Last Assessment & Plan:  Superimposed on CK D stage III, Baseline Cr ~2-2.5, remains above baseline, felt to be due to Cardiorenal syndrome, creatinine trending down at time of discharge Negative urine eos and no urinary retention ACE inhibitor discontinued Follows with Dr Julien Girt as outpatient, to have labs drawn 08/15/16 and results sent to [facility] office for review   AICD (automatic cardioverter/defibrillator) present    Anemia 12/29/2016   Aortic stenosis    Arthritis    hands   ASD (atrial septal defect)    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   Asthma    as a child   Atrial fibrillation with RVR (HCC)    Biventricular ICD (implantable  cardioverter-defibrillator) in place    CHF (congestive heart failure) (HCC)    CKD (chronic kidney disease), stage IV (HCC)    Colitis 09/11/2012   Complete heart block (HCC)    Dyspnea    Essential hypertension    GERD (gastroesophageal reflux disease)    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 Physical exam revealed cerumen impaction of left ear canal. Relieved by flushing.    Hypomagnesemia 04/10/2016   Hyponatremia 04/10/2016   Last Assessment & Plan:  - improved after tolvaptan 6/29, chiefly driven by CHF, further improvement with ongoing IV Lasix - continue low Na and fluid restriction - compliance with sodium restriction a major barrier to keeping patient out of hospital and compensated, remains resistant to this philosophy while inpatient - follows with Dr Julien Girt as outpatient, Neph consult not indicated at this ti   Hypothyroidism    ICD (implantable cardioverter-defibrillator) battery depletion 04/21/2014   Localized macular rash 08/11/2016   Last Assessment &  Plan:  Seems to trend with heart failure, though she has more findings behind bilateral knees and up into her thigh/groin area than when I last saw her Would use steroid cream for now, consider alternative diagnosis if no improvement   Long-term insulin use in type 2 diabetes (HCC) 04/10/2016   Last Assessment & Plan:  Hypoglycemia resolved, continue SSI and hypoglycemia protocol   Mitral regurgitation    Nonischemic cardiomyopathy (HCC)    On amiodarone therapy 08/21/2017   Persistent atrial fibrillation (HCC)    AV node ablation 2019   Pneumonia    PONV (postoperative nausea and vomiting)    has awakened during surgery   Restless leg syndrome 03/22/2019   Type 2 diabetes mellitus (HCC)      Assessment: 75 YOF presenting with nausea x2wk and ppor oral intake, hx of permanent afib on warfarin PTA. Last dose 04/25/23. INR on admission is supratherapeutic at 5.5.   PTA  Warfarin 2mg  MWFSu, 4mg  TTSa  INR down to 2.4. PO intake not charted.   Goal of Therapy:  INR 2-3 Monitor platelets by anticoagulation protocol: Yes   Plan:  Warfarin 3mg  x1 Monitor daily INR, CBC, signs/symptoms of bleeding    Thank you for allowing pharmacy to be a part of this patient's care.  Alphia Moh, PharmD, BCPS, BCCP Clinical Pharmacist  Please check AMION for all [facility] Pharmacy phone numbers After 10:00 PM, call Main Pharmacy [phone]

## 2023-04-28 NOTE — Inpatient Diabetes Management (Signed)
 Inpatient Diabetes Program Recommendations  AACE/ADA: New Consensus Statement on Inpatient Glycemic Control (2015)  Target Ranges:  Prepandial:   less than 140 mg/dL      Peak postprandial:   less than 180 mg/dL (1-2 hours)      Critically ill patients:  140 - 180 mg/dL   Lab Results  Component Value Date   GLUCAP 235 (H) 04/28/2023   HGBA1C 10.3 (H) 04/27/2023    Latest Reference Range & Units 04/27/23 06:18 04/27/23 13:15 04/27/23 16:12 04/27/23 21:28 04/28/23 06:10 04/28/23 11:21  Glucose-Capillary 70 - 99 mg/dL 161 (H) 096 (H) 045 (H) 218 (H) 162 (H) 235 (H)   Diabetes history: DM2 Outpatient Diabetes medications: Toujeo 24 units daily, Humalog 4-8 units tid meal coverage, Jardiance 10 mg daily Current orders for Inpatient glycemic control:  Lantus 8 units qhs Novolog 0-9 units tid + hs  Inpatient Diabetes Program Recommendations:    Note: Glucose trends increase after PO intake  -   Add Novolog 3 units tid meal coverage if pt is eating   Thanks,  Christena Deem RN, MSN, BC-ADM Inpatient Diabetes Coordinator Team Pager [phone] (8a-5p)

## 2023-04-28 NOTE — Progress Notes (Signed)
 Heart Failure Navigator Progress Note  Assessed for Heart & Vascular TOC clinic readiness.  Patient does not meet criteria due to Advanced Heart Failure Team patient of Dr. Shirlee Latch.   Navigator will sign off at this time.    Rhae Hammock, BSN, Scientist, clinical (histocompatibility and immunogenetics) Only

## 2023-04-28 NOTE — Progress Notes (Addendum)
 [facility] PROGRESS NOTE   AMOREE NEWLON ZOX:[id number] DOB: 09-Jul-1950 DOA: 04/26/2023  PCP: Glendale Chard, DO  Brief History: 73 y.o. female with a past medical history of chronic kidney disease stage IV, chronic systolic CHF with a EF of 30 to 35% based on echocardiogram in June 2024, permanent atrial fibrillation, hypothyroidism, insulin-dependent diabetes mellitus, essential hypertension who was in her usual state of health till about a month ago when she started feeling nauseated.  Her oral intake has been poor.  She has lost about 15 pounds in the last 1 month.  She went to see her primary care provider.  Subsequently she saw her nephrologist and underwent blood work which revealed significant worsening in her renal function.  She was sent to the hospital.  Subsequently hospitalized.    Consultants: Nephrology  Procedures: None yet    Subjective/Interval History: Patient feels well.  Denies any shortness of breath or chest pain.  No nausea vomiting.  Urinating well.      Assessment/Plan:   Acute kidney injury on chronic kidney disease stage IV Her creatinine was 2.83 in January.  Presented with creatinine of 4.95 and BUN of 147.  All of her diuretics and nephrotoxic agents including ARB were held. Nephrology was consulted. Patient started on IV fluids. Creatinine has improved significantly to 2.95 today.  Urine output not being charted accurately. CT of the abdomen pelvis did not show any acute findings. Patient mentions that she does not want to consider dialysis.  Nephrology continues to follow.   Hyponatremia/hypokalemia Corrected for glucose her sodium level was 123 at admission.  She was gently started on IV fluids.  Sodium levels have improved.    Concern for subacute stroke on CT head CT head incidentally showed small to moderate size left-sided subacute infarct.  Patient did not have any focal deficits.  She attributes difficulty moving her right leg due  to recent fall and injury to her right knee.   Unfortunately due to ICD she cannot undergo MRI.   CT head was repeated on 2/27 and shows similar findings. Carotid Doppler does not show any significant stenosis. Echocardiogram shows LVEF of 35 to 40% as is known previously with perhaps slight improvement. She remains on warfarin. Patient never had any symptoms of stroke.  LDL is 43.  Continue statin. HbA1c 10.3. Await PT and OT evaluation. Will discuss with neurology but do not anticipate any change in management at this time.   Diabetes mellitus type 2 in the setting of chronic kidney disease stage IV Continue SSI glargine.  HbA1c is 10.3.   Chronic systolic CHF Current echocardiogram showed LVEF of 35 to 40% with/perhaps slight improvement compared to echo from June 2024.   She has an ICD in situ.  She is followed by heart failure clinic.   Due to her worsening renal function and electrolyte abnormalities we or holding her ARB and diuretics including metolazone and torsemide and eplerenone. Monitor for signs of volume overload Cardiology has seen the patient and does not have any further recommendations. Will need to see what her renal function improves to before reinitiating her GDMT.   History of permanent atrial fibrillation Anticoagulated with warfarin.  INR supratherapeutic at admission. Now INR is therapeutic.  Pharmacy is managing..  Drop in hemoglobin is likely dilutional.  No evidence of overt bleeding.  She does admit to seeing some blood in the urine but no hematuria noted on UA.  CT scan did not show any acute findings. Metoprolol  was placed on hold due to hypotension. Blood pressures have stabilized.  However remains borderline low.  Continue to hold for now. Check hemoglobin today.   History of hypothyroidism Continue with levothyroxine.   Weight loss Could be just from poor oral intake.  CT of the abdomen pelvis did not show any overt malignancy.  Outpatient  monitoring.   Hyperlipidemia Was on statin prior to admission.  LDL is 43.   DVT Prophylaxis: On warfarin Code Status: DNR Family Communication: Discussed with patient.   Disposition Plan: Hopefully return home when improved.  Anticipate discharge in 24 to 48 hours.     Medications: Scheduled:  insulin aspart  0-5 Units Subcutaneous QHS   insulin aspart  0-9 Units Subcutaneous TID WC   insulin glargine  8 Units Subcutaneous QHS   levothyroxine  88 mcg Oral QAC breakfast   living well with diabetes book   Does not apply Once   senna-docusate  2 tablet Oral BID   warfarin  3 mg Oral ONCE-1600   Warfarin - Pharmacist Dosing Inpatient   Does not apply q1600   Continuous:  lactated ringers 50 mL/hr at 04/28/23 0600   UJW:[id number] **OR** acetaminophen, ondansetron **OR** ondansetron (ZOFRAN) IV, mouth rinse   Objective:  Vital Signs  Vitals:   04/27/23 1354 04/27/23 2055 04/28/23 0441 04/28/23 0728  BP: 114/70 114/77 (!) 115/53 103/60  Pulse: 78 80 80 84  Resp: 17 16 16 17   Temp: 97.9 F (36.6 C) 98.1 F (36.7 C) 97.9 F (36.6 C) 98.4 F (36.9 C)  TempSrc: Oral Oral    SpO2: 100% 97% 99% 100%  Weight:      Height:        Intake/Output Summary (Last 24 hours) at 04/28/2023 1130 Last data filed at 04/28/2023 0600 Gross per 24 hour  Intake 1180.83 ml  Output --  Net 1180.83 ml   Filed Weights   04/26/23 1114  Weight: 54.9 kg    General appearance: Awake alert.  In no distress Resp: Clear to auscultation bilaterally.  Normal effort Cardio: S1-S2 is normal regular.  No S3-S4.  No rubs murmurs or bruit GI: Abdomen is soft.  Nontender nondistended.  Bowel sounds are present normal.  No masses organomegaly   Lab Results:  Data Reviewed: I have personally reviewed following labs and reports of the imaging studies  CBC: Recent Labs  Lab 04/26/23 1134 04/27/23 0600  WBC 9.0 8.3  NEUTROABS 7.0  --   HGB 11.0* 9.2*  HCT 32.7* 27.4*  MCV 80.0 81.8  PLT  309 240    Basic Metabolic Panel: Recent Labs  Lab 04/26/23 1134 04/26/23 2009 04/27/23 0600 04/28/23 0641  NA 116* 122* 124* 128*  K 3.4* 3.1* 3.2* 3.5  CL 74* 81* 85* 93*  CO2 18* 22 21* 20*  GLUCOSE 389* 278* 141* 160*  BUN 147* 142* 143* 127*  CREATININE 4.95* 4.78* 4.36* 2.95*  CALCIUM 7.8* 7.6* 7.6* 8.0*  MG 2.3  --   --   --   PHOS  --  6.8*  --   --     GFR: Estimated Creatinine Clearance: 13.2 mL/min (A) (by C-G formula based on SCr of 2.95 mg/dL (H)).  Liver Function Tests: Recent Labs  Lab 04/26/23 1134 04/26/23 2009  AST 24  --   ALT 14  --   ALKPHOS 91  --   BILITOT 1.9*  --   PROT 7.5  --   ALBUMIN 3.8 3.6    Coagulation  Profile: Recent Labs  Lab 04/26/23 1321 04/27/23 0600 04/28/23 0641  INR 5.5* 5.1* 2.4*    CBG: Recent Labs  Lab 04/27/23 0618 04/27/23 1315 04/27/23 1612 04/27/23 2128 04/28/23 0610  GLUCAP 164* 187* 271* 218* 162*    Lipid Profile: Recent Labs    04/27/23 0600  CHOL 108  HDL 25*  LDLCALC 43  TRIG 161*  CHOLHDL 4.3    Thyroid Function Tests: Recent Labs    04/26/23 2009  TSH 0.505     Radiology Studies: VAS US CAROTID Result Date: 04/28/2023 Carotid Arterial Duplex Study Patient Name:  DEVINNE EPSTEIN  Date of Exam:   04/27/2023 Medical Rec #: [id number]        Accession #:    [id number] Date of Birth: 11/05/1950        Patient Gender: F Patient Age:   25 years Exam Location:  [facility] Procedure:      VAS US CAROTID Referring Phys: Osvaldo Shipper --------------------------------------------------------------------------------  Indications:       Subacute CVA. Risk Factors:      Hypertension, Diabetes. Other Factors:     Atrial fibrillation, CKD IV, CHF. Comparison Study:  No prior carotid duplex on file Performing Technologist: Sherren Kerns RVS  Examination Guidelines: A complete evaluation includes B-mode imaging, spectral Doppler, color Doppler, and power Doppler as needed of all accessible  portions of each vessel. Bilateral testing is considered an integral part of a complete examination. Limited examinations for reoccurring indications may be performed as noted.  Right Carotid Findings: +----------+--------+--------+--------+------------------+------------------+           PSV cm/sEDV cm/sStenosisPlaque DescriptionComments           +----------+--------+--------+--------+------------------+------------------+ CCA Prox  83      11                                intimal thickening +----------+--------+--------+--------+------------------+------------------+ CCA Distal87      12                                intimal thickening +----------+--------+--------+--------+------------------+------------------+ ICA Prox  97      21      1-39%   calcific                             +----------+--------+--------+--------+------------------+------------------+ ICA Mid   98      18                                                   +----------+--------+--------+--------+------------------+------------------+ ICA Distal155     33              heterogenous                         +----------+--------+--------+--------+------------------+------------------+ ECA       157     6               calcific                             +----------+--------+--------+--------+------------------+------------------+ +----------+--------+-------+--------+-------------------+  PSV cm/sEDV cmsDescribeArm Pressure (mmHG) +----------+--------+-------+--------+-------------------+ [id number]                                         +----------+--------+-------+--------+-------------------+ +---------+--------+--+--------+--+ VertebralPSV cm/s62EDV cm/s10 +---------+--------+--+--------+--+  Left Carotid Findings: +----------+--------+--------+--------+------------------+------------------+           PSV cm/sEDV cm/sStenosisPlaque DescriptionComments            +----------+--------+--------+--------+------------------+------------------+ CCA Prox  104     14                                intimal thickening +----------+--------+--------+--------+------------------+------------------+ CCA Distal98      12                                intimal thickening +----------+--------+--------+--------+------------------+------------------+ ICA Prox  131     22      1-39%   calcific          Shadowing          +----------+--------+--------+--------+------------------+------------------+ ICA Mid   137     18                                                   +----------+--------+--------+--------+------------------+------------------+ ICA Distal139     26              heterogenous                         +----------+--------+--------+--------+------------------+------------------+ ECA       176     2               calcific                             +----------+--------+--------+--------+------------------+------------------+ +----------+--------+--------+---------+-------------------+           PSV cm/sEDV cm/sDescribe Arm Pressure (mmHG) +----------+--------+--------+---------+-------------------+ [id number]             Turbulent                    +----------+--------+--------+---------+-------------------+ +---------+--------+--------+------------+ VertebralPSV cm/sEDV cm/sNot assessed +---------+--------+--------+------------+   Summary: Right Carotid: Velocities in the right ICA are consistent with a 1-39% stenosis. Left Carotid: Velocities in the left ICA are consistent with a 1-39% stenosis. Vertebrals:  Right vertebral artery demonstrates antegrade flow. Left not              assessed. Subclavians: Left subclavian artery flow was disturbed. Normal flow hemodynamics              were seen in the right subclavian artery. *See table(s) above for measurements and observations.  Electronically signed by Carolynn Sayers on 04/28/2023 at 7:19:22 AM.    Final    ECHOCARDIOGRAM COMPLETE Result Date: 04/27/2023    ECHOCARDIOGRAM REPORT   Patient Name:   LLEWELLYN SCHOENBERGER Date of Exam: 04/27/2023 Medical Rec #:  [id number]       Height:       62.0 in Accession #:    [id number]      Weight:       121.0  lb Date of Birth:  09/26/1950       BSA:          1.544 m Patient Age:    72 years        BP:           120/59 mmHg Patient Gender: F               HR:           80 bpm. Exam Location:  Inpatient Procedure: 2D Echo, Cardiac Doppler, Color Doppler and Intracardiac            Opacification Agent (Both Spectral and Color Flow Doppler were            utilized during procedure). Indications:    Stroke I63.9  History:        Patient has prior history of Echocardiogram examinations, most                 recent 04/28/2022. CHF and Cardiomyopathy, Stroke, Mitral Valve                 Disease; Risk Factors:Hypertension and Diabetes.  Sonographer:    Webb Laws Referring Phys: [id number] Glenisha Gundry IMPRESSIONS  1. Left ventricular ejection fraction, by estimation, is 35 to 40%. The left ventricle has moderately decreased function. The left ventricle demonstrates global hypokinesis. The left ventricular internal cavity size was dilated. Left ventricular diastolic parameters are indeterminate.  2. Right ventricular systolic function is normal. The right ventricular size is normal.  3. The mitral valve is degenerative. Mild to moderate mitral valve regurgitation.  4. Decreased stroke volume index, DVI 0.34. Likely a degree of low flow low gradient aortic stenosis. The aortic valve was not well visualized. Aortic valve regurgitation is not visualized. Mild to moderate aortic valve stenosis.  5. The inferior vena cava is dilated in size with >50% respiratory variability, suggesting right atrial pressure of 8 mmHg. Comparison(s): No significant change from prior study. Prior images reviewed side by side. FINDINGS  Left Ventricle: Left ventricular  ejection fraction, by estimation, is 35 to 40%. The left ventricle has moderately decreased function. The left ventricle demonstrates global hypokinesis. Definity contrast agent was given IV to delineate the left ventricular endocardial borders. Strain imaging was not performed. The left ventricular internal cavity size was dilated. There is no left ventricular hypertrophy. Left ventricular diastolic parameters are indeterminate. Right Ventricle: The right ventricular size is normal. No increase in right ventricular wall thickness. Right ventricular systolic function is normal. Left Atrium: Left atrial size was normal in size. Right Atrium: Right atrial size was normal in size. Pericardium: There is no evidence of pericardial effusion. Mitral Valve: The mitral valve is degenerative in appearance. Mild to moderate mitral valve regurgitation. Tricuspid Valve: The tricuspid valve is normal in structure. Tricuspid valve regurgitation is trivial. No evidence of tricuspid stenosis. Aortic Valve: Decreased stroke volume index, DVI 0.34. Likely a degree of low flow low gradient aortic stenosis. The aortic valve was not well visualized. Aortic valve regurgitation is not visualized. Mild to moderate aortic stenosis is present. Aortic valve mean gradient measures 13.2 mmHg. Aortic valve peak gradient measures 22.7 mmHg. Aortic valve area, by VTI measures 0.67 cm. Pulmonic Valve: The pulmonic valve was normal in structure. Pulmonic valve regurgitation is not visualized. No evidence of pulmonic stenosis. Aorta: The aortic root is normal in size and structure. Venous: The inferior vena cava is dilated in size with greater than 50% respiratory variability, suggesting right  atrial pressure of 8 mmHg. IAS/Shunts: The interatrial septum was not well visualized. Additional Comments: 3D imaging was not performed. A device lead is visualized in the right ventricle and right atrium.  LEFT VENTRICLE PLAX 2D LVIDd:         5.30 cm       Diastology LVIDs:         4.90 cm      LV e' medial:    5.22 cm/s LV PW:         0.90 cm      LV E/e' medial:  25.9 LV IVS:        1.00 cm      LV e' lateral:   7.29 cm/s LVOT diam:     1.60 cm      LV E/e' lateral: 18.5 LV SV:         37 LV SV Index:   24 LVOT Area:     2.01 cm  LV Volumes (MOD) LV vol d, MOD A2C: 90.8 ml LV vol d, MOD A4C: 100.0 ml LV vol s, MOD A2C: 62.7 ml LV vol s, MOD A4C: 56.1 ml LV SV MOD A2C:     28.1 ml LV SV MOD A4C:     100.0 ml LV SV MOD BP:      38.9 ml RIGHT VENTRICLE            IVC RV Basal diam:  3.40 cm    IVC diam: 2.20 cm RV S prime:     6.42 cm/s TAPSE (M-mode): 1.8 cm LEFT ATRIUM             Index        RIGHT ATRIUM           Index LA diam:        3.70 cm 2.40 cm/m   RA Area:     13.70 cm LA Vol (A2C):   23.7 ml 15.35 ml/m  RA Volume:   31.70 ml  20.53 ml/m LA Vol (A4C):   45.2 ml 29.27 ml/m LA Biplane Vol: 34.8 ml 22.54 ml/m  AORTIC VALVE AV Area (Vmax):    0.77 cm AV Area (Vmean):   0.81 cm AV Area (VTI):     0.67 cm AV Vmax:           238.25 cm/s AV Vmean:          170.750 cm/s AV VTI:            0.546 m AV Peak Grad:      22.7 mmHg AV Mean Grad:      13.2 mmHg LVOT Vmax:         90.70 cm/s LVOT Vmean:        68.600 cm/s LVOT VTI:          0.183 m LVOT/AV VTI ratio: 0.34  AORTA Ao Asc diam: 2.30 cm MITRAL VALVE                  TRICUSPID VALVE MV Area (PHT): 3.76 cm       TR Peak grad:   21.3 mmHg MV Decel Time: 202 msec       TR Vmax:        231.00 cm/s MR Peak grad:    83.9 mmHg MR Mean grad:    49.0 mmHg    SHUNTS MR Vmax:         458.00 cm/s  Systemic VTI:  0.18 m MR Vmean:  330.0 cm/s   Systemic Diam: 1.60 cm MR PISA:         1.01 cm MR PISA Eff ROA: 7 mm MR PISA Radius:  0.40 cm MV E velocity: 135.00 cm/s Riley Lam MD Electronically signed by Riley Lam MD Signature Date/Time: 04/27/2023/12:54:19 PM    Final    CT HEAD WO CONTRAST ( ) Result Date: 04/27/2023 CLINICAL DATA:  Stroke follow-up EXAM: CT HEAD WITHOUT CONTRAST  TECHNIQUE: Contiguous axial images were obtained from the base of the skull through the vertex without intravenous contrast. RADIATION DOSE REDUCTION: This exam was performed according to the departmental dose-optimization program which includes automated exposure control, adjustment of the mA and/or kV according to patient size and/or use of iterative reconstruction technique. COMPARISON:  CT head 04/26/2023 FINDINGS: Brain: Similar appearance of hypoattenuation in the left parieto-occipital lobes suggestive of possible subacute infarct. No evidence of hemorrhagic conversion. No additional areas of infarct appreciated. Nonspecific hypoattenuation in the periventricular and subcortical white matter favored to reflect chronic microvascular ischemic changes. No edema, mass effect, or midline shift. The basilar cisterns are patent. Ventricles: The ventricles are normal. Vascular: Atherosclerotic calcifications of the carotid siphons and intracranial vertebral arteries. No hyperdense vessel. Skull: No acute or aggressive finding. Orbits: Orbits are symmetric. Sinuses: The visualized paranasal sinuses are clear. Other: Mastoid air cells are clear. IMPRESSION: Similar appearance of probably subacute infarct in the left parietooccipital lobes. No evidence of hemorrhagic conversion. Electronically Signed   By: Emily Filbert M.D.   On: 04/27/2023 11:00   CT ABDOMEN PELVIS WO CONTRAST Result Date: 04/26/2023 CLINICAL DATA:  Abdominal pain, weight loss, acute kidney injury EXAM: CT ABDOMEN AND PELVIS WITHOUT CONTRAST TECHNIQUE: Multidetector CT imaging of the abdomen and pelvis was performed following the standard protocol without IV contrast. RADIATION DOSE REDUCTION: This exam was performed according to the departmental dose-optimization program which includes automated exposure control, adjustment of the mA and/or kV according to patient size and/or use of iterative reconstruction technique. COMPARISON:  None Available.  FINDINGS: Lower chest: No acute abnormality.  ICD wires in the right heart. Hepatobiliary: No focal liver abnormality is seen. Status post cholecystectomy. No biliary dilatation. Pancreas: No focal abnormality or ductal dilatation. Spleen: No focal abnormality.  Normal size. Adrenals/Urinary Tract: Renovascular calcifications within the hila and kidneys bilaterally. No visible urinary tract stones. No hydronephrosis. No renal or adrenal mass. Urinary bladder unremarkable. Stomach/Bowel: Stomach, large and small bowel grossly unremarkable. Vascular/Lymphatic: Diffuse vascular calcifications. No aneurysm or adenopathy. Reproductive: Prior hysterectomy.  No adnexal masses. Other: No free fluid or free air. Musculoskeletal: No acute bony abnormality IMPRESSION: No acute findings in the abdomen or pelvis. Diffuse aortic/arterial atherosclerosis. Electronically Signed   By: Charlett Nose M.D.   On: 04/26/2023 21:29   DG Lumbar Spine Complete Result Date: 04/26/2023 CLINICAL DATA:  Fall.  Low back pain. EXAM: LUMBAR SPINE - COMPLETE 4+ VIEW; SACRUM AND COCCYX - 2+ VIEW COMPARISON:  None Available. FINDINGS: There are 5 nonrib-bearing lumbar vertebrae. There is mild loss of lumbar lordosis, which may be on the basis of positioning or due to muscle spasm. No spondylolysis. There is grade 1 anterolisthesis of L3 over L4, most likely degenerative. Vertebral body heights are maintained. No aggressive osseous lesion. Mild multilevel degenerative changes in the form of facet arthropathy and marginal osteophyte formation. Visualized soft tissues are within normal limits. Pelvis is intact with normal and symmetric sacroiliac joints. No acute fracture or dislocation. No aggressive osseous lesion. Visualized sacral arcuate lines are unremarkable.  Unremarkable symphysis pubis. There are mild degenerative changes of bilateral hip joints without significant joint space narrowing. Osteophytosis of the superior acetabulum. No radiopaque  foreign bodies. IMPRESSION: 1. No acute osseous abnormality of the lumbar spine or sacrum/coccyx. 2. Mild multilevel degenerative changes of the lumbar spine. 3. Mild degenerative changes of bilateral hip joints. Electronically Signed   By: Jules Schick M.D.   On: 04/26/2023 15:25   DG Sacrum/Coccyx Result Date: 04/26/2023 CLINICAL DATA:  Fall.  Low back pain. EXAM: LUMBAR SPINE - COMPLETE 4+ VIEW; SACRUM AND COCCYX - 2+ VIEW COMPARISON:  None Available. FINDINGS: There are 5 nonrib-bearing lumbar vertebrae. There is mild loss of lumbar lordosis, which may be on the basis of positioning or due to muscle spasm. No spondylolysis. There is grade 1 anterolisthesis of L3 over L4, most likely degenerative. Vertebral body heights are maintained. No aggressive osseous lesion. Mild multilevel degenerative changes in the form of facet arthropathy and marginal osteophyte formation. Visualized soft tissues are within normal limits. Pelvis is intact with normal and symmetric sacroiliac joints. No acute fracture or dislocation. No aggressive osseous lesion. Visualized sacral arcuate lines are unremarkable. Unremarkable symphysis pubis. There are mild degenerative changes of bilateral hip joints without significant joint space narrowing. Osteophytosis of the superior acetabulum. No radiopaque foreign bodies. IMPRESSION: 1. No acute osseous abnormality of the lumbar spine or sacrum/coccyx. 2. Mild multilevel degenerative changes of the lumbar spine. 3. Mild degenerative changes of bilateral hip joints. Electronically Signed   By: Jules Schick M.D.   On: 04/26/2023 15:25   CT Head Wo Contrast Result Date: 04/26/2023 CLINICAL DATA:  Polytrauma, blunt.  Dizziness and weakness. EXAM: CT HEAD WITHOUT CONTRAST TECHNIQUE: Contiguous axial images were obtained from the base of the skull through the vertex without intravenous contrast. RADIATION DOSE REDUCTION: This exam was performed according to the departmental  dose-optimization program which includes automated exposure control, adjustment of the mA and/or kV according to patient size and/or use of iterative reconstruction technique. COMPARISON:  None Available. FINDINGS: Brain: There is a 3 x 2 cm infarct along the left parieto-occipital sulcus which is of indeterminate age but may be subacute. Small age indeterminate but possibly recent infarcts are also questioned in the right cerebellar hemisphere. There is also a small age indeterminate infarct laterally in the left cerebellar hemisphere. No acute intracranial hemorrhage, mass, midline shift, or extra-axial fluid collection is identified. Cerebral volume is within normal limits for age. The ventricles are normal in size. Cerebral white matter hypo stops that hypodensities elsewhere in the cerebral white matter bilaterally are nonspecific but compatible with mild chronic small vessel ischemic disease. Vascular: Calcified atherosclerosis at the skull base. No hyperdense vessel. Skull: No acute fracture or suspicious lesion. Sinuses/Orbits: Small volume secretions in the left maxillary sinus. Clear mastoid air cells. Bilateral cataract extraction. Other: None. IMPRESSION: 1. Small to moderate-sized, potentially subacute left parieto-occipital infarct. 2. Small age indeterminate cerebellar infarcts. 3. Mild chronic small vessel ischemic disease. Electronically Signed   By: Sebastian Ache M.D.   On: 04/26/2023 14:30   DG Chest Portable 1 View Result Date: 04/26/2023 CLINICAL DATA:  Shortness of breath. EXAM: PORTABLE CHEST 1 VIEW COMPARISON:  August 31, 2022. FINDINGS: Stable cardiomediastinal silhouette. Left-sided defibrillator is again noted. Lungs are clear. Bony thorax is unremarkable. IMPRESSION: No active disease. Electronically Signed   By: Lupita Raider M.D.   On: 04/26/2023 13:15       LOS: 2 days   [employer]  [facility]  Pager on [email or web address]  04/28/2023, 11:30 AM

## 2023-04-29 ENCOUNTER — Other Ambulatory Visit (HOSPITAL_BASED_OUTPATIENT_CLINIC_OR_DEPARTMENT_OTHER): Payer: Self-pay

## 2023-04-29 ENCOUNTER — Other Ambulatory Visit (HOSPITAL_COMMUNITY): Payer: Self-pay

## 2023-04-29 DIAGNOSIS — I5022 Chronic systolic (congestive) heart failure: Secondary | ICD-10-CM | POA: Diagnosis not present

## 2023-04-29 DIAGNOSIS — E871 Hypo-osmolality and hyponatremia: Secondary | ICD-10-CM | POA: Diagnosis not present

## 2023-04-29 DIAGNOSIS — N179 Acute kidney failure, unspecified: Secondary | ICD-10-CM | POA: Diagnosis not present

## 2023-04-29 LAB — BASIC METABOLIC PANEL
Anion gap: 14 (ref 5–15)
BUN: 108 mg/dL — ABNORMAL HIGH (ref 8–23)
CO2: 21 mmol/L — ABNORMAL LOW (ref 22–32)
Calcium: 8.7 mg/dL — ABNORMAL LOW (ref 8.9–10.3)
Chloride: 92 mmol/L — ABNORMAL LOW (ref 98–111)
Creatinine, Ser: 2.18 mg/dL — ABNORMAL HIGH (ref 0.44–1.00)
GFR, Estimated: 23 mL/min — ABNORMAL LOW (ref >60)
Glucose, Bld: 140 mg/dL — ABNORMAL HIGH (ref 70–99)
Potassium: 3.5 mmol/L (ref 3.5–5.1)
Sodium: 127 mmol/L — ABNORMAL LOW (ref 135–145)

## 2023-04-29 LAB — PROTIME-INR
INR: 1.8 — ABNORMAL HIGH (ref 0.8–1.2)
Prothrombin Time: 21.5 s — ABNORMAL HIGH (ref 11.4–15.2)

## 2023-04-29 LAB — GLUCOSE, CAPILLARY
Glucose-Capillary: 141 mg/dL — ABNORMAL HIGH (ref 70–99)
Glucose-Capillary: 201 mg/dL — ABNORMAL HIGH (ref 70–99)

## 2023-04-29 MED ORDER — WARFARIN SODIUM 4 MG PO TABS
4.0000 mg | ORAL_TABLET | Freq: Once | ORAL | Status: DC
  Filled 2023-04-29: qty 1

## 2023-04-29 MED ORDER — PROCHLORPERAZINE MALEATE 10 MG PO TABS
10.0000 mg | ORAL_TABLET | Freq: Four times a day (QID) | ORAL | 0 refills | Status: DC | PRN
  Filled 2023-04-29: qty 30, 8d supply, fill #0

## 2023-04-29 MED ORDER — METOPROLOL SUCCINATE ER 50 MG PO TB24
100.0000 mg | ORAL_TABLET | Freq: Every day | ORAL | Status: DC
  Administered 2023-04-29: 100 mg via ORAL
  Filled 2023-04-29: qty 2

## 2023-04-29 MED ORDER — TORSEMIDE 20 MG PO TABS
ORAL_TABLET | ORAL | 0 refills | Status: DC
  Filled 2023-04-29: qty 60, fill #0

## 2023-04-29 NOTE — Evaluation (Signed)
 Physical Therapy Evaluation Patient Details Name: Emily [name] MRN: [id number] DOB: [birth year] Today's Date: 04/29/2023  History of Present Illness  Pt is a 73 y/o female admitted for AKI on CKD and hyponatremia. Head CT with incidental showing of subacute L parietoccipital lobes. PMH: DM2, a fib, CHF, CKD, HLD, hypothyroidism, ICD   Clinical Impression  Emily [name] is 73 y.o. female admitted with above HPI and diagnosis. Patient is currently limited by functional impairments below (see PT problem list). Patient lives with spouse and daughter and is independent with no AD at baseline. Currently pt mobilizing at ind level for bed mob and transfers and supervision to amb with/without RW. Balance improved slightly with support of walker. Patient will benefit from continued skilled PT interventions to address impairments and progress independence with mobility. Acute PT will follow and progress as able, pt declines OPPT for balance training.      If plan is discharge home, recommend the following:     Can travel by private vehicle        Equipment Recommendations None recommended by PT  Recommendations for Other Services       Functional Status Assessment Patient has had a recent decline in their functional status and demonstrates the ability to make significant improvements in function in a reasonable and predictable amount of time.     Precautions / Restrictions Precautions Precautions: Fall Recall of Precautions/Restrictions: Intact Restrictions Weight Bearing Restrictions Per Provider Order: No      Mobility  Bed Mobility               General bed mobility comments: seated EOB    Transfers Overall transfer level: Independent Equipment used: None               General transfer comment: use of hands to rise and lower    Ambulation/Gait Ambulation/Gait assistance: Supervision, Modified independent (Device/Increase time) Gait Distance (Feet): 200  Feet Assistive device: Rolling walker (2 wheels), None Gait Pattern/deviations: Step-through pattern, Decreased stride length Gait velocity: decr     General Gait Details: overall pt steady with good poature, no LOB noted. pt amb with RW and short bout without, slight lateral sway with no AD but overall steady.  Stairs            Wheelchair Mobility     Tilt Bed    Modified Rankin (Stroke Patients Only)       Balance Overall balance assessment: Mild deficits observed, not formally tested                                           Pertinent Vitals/Pain Pain Assessment Pain Assessment: No/denies pain Pain Intervention(s): Limited activity within patient's tolerance, Monitored during session    Home Living Family/patient expects to be discharged to:: Private residence Living Arrangements: Spouse/significant other;Children Available Help at Discharge: Family Type of Home: House Home Access: Stairs to enter Entrance Stairs-Rails: None Entrance Stairs-Number of Steps: 3   Home Layout: One level Home Equipment: Agricultural consultant (2 wheels);Rollator (4 wheels);Cane - quad;Cane - single point      Prior Function Prior Level of Function : Independent/Modified Independent;History of Falls (last six months)             Mobility Comments: no AD for mobility typically. one recent fall at home (husband attempted to assist pt up and he also fell) ADLs Comments:  Indep with ADLs, daughter assists with IADLs as needed. pt enjoys going to church. Pt provides setup assist for husband for meals/sponge bathing     Extremity/Trunk Assessment   Upper Extremity Assessment Upper Extremity Assessment: Defer to OT evaluation;Overall [facility] for tasks assessed    Lower Extremity Assessment Lower Extremity Assessment: Overall WFL for tasks assessed    Cervical / Trunk Assessment Cervical / Trunk Assessment: Normal  Communication   Communication Communication: No  apparent difficulties    Cognition Arousal: Alert Behavior During Therapy: WFL for tasks assessed/performed   PT - Cognitive impairments: No apparent impairments                         Following commands: Intact       Cueing Cueing Techniques: Verbal cues     General Comments      Exercises     Assessment/Plan    PT Assessment Patient needs continued PT services  PT Problem List Decreased strength;Decreased activity tolerance;Decreased balance;Decreased mobility;Decreased knowledge of use of DME;Decreased knowledge of precautions       PT Treatment Interventions DME instruction;Balance training;Therapeutic exercise;Therapeutic activities;Functional mobility training;Stair training;Gait training;Patient/family education    PT Goals (Current goals can be found in the Care Plan section)  Acute Rehab PT Goals Patient Stated Goal: get home today PT Goal Formulation: With patient Time For Goal Achievement: 05/06/23 Potential to Achieve Goals: Good    Frequency Min 1X/week     Co-evaluation               AM-PAC PT "6 Clicks" Mobility  Outcome Measure Help needed turning from your back to your side while in a flat bed without using bedrails?: None Help needed moving from lying on your back to sitting on the side of a flat bed without using bedrails?: None Help needed moving to and from a bed to a chair (including a wheelchair)?: None Help needed standing up from a chair using your arms (e.g., wheelchair or bedside chair)?: None Help needed to walk in hospital room?: A Little Help needed climbing 3-5 steps with a railing? : A Little 6 Click Score: 22    End of Session Equipment Utilized During Treatment: Gait belt Activity Tolerance: Patient tolerated treatment well Patient left: with call bell/phone within reach Nurse Communication: Mobility status PT Visit Diagnosis: Other abnormalities of gait and mobility (R26.89);Muscle weakness (generalized)  (M62.81);Difficulty in walking, not elsewhere classified (R26.2);Other symptoms and signs involving the nervous system (R29.898)    Time: 1610-9604 PT Time Calculation (min) (ACUTE ONLY): 13 min   Charges:   PT Evaluation $PT Eval Low Complexity: 1 Low   PT General Charges $$ ACUTE PT VISIT: 1 Visit         Wynn Maudlin, DPT Acute Rehabilitation Services Office [phone]  04/29/23 1:58 PM

## 2023-04-29 NOTE — Progress Notes (Signed)
 [facility] Progress Note    Assessment/ Plan:   AKI on CKD 4 -followed at our [place] office. Likely prerenal injury in the context of profound hyperglycemia, low PO intake. -UA pending, CT abd/pel without obstruction -continue to hold home diuretics here -Peak creatinine 4.95.  Down to 2.18 today.  BUN trending down as well, down to 108 -fluids: Can hold off on IV fluids.  Encouraged p.o. hydration. -renal replacement therapy indications: no urgent indications especially as she is clinically doing better and her kidney function is improving.  I am concerned about her tolerating hemodialysis in the long run given her functional status and underlying HFrEF. She may be a candidate for PD in the long run but again, her functional status may be challenging for this and they did report to me that she won't be able to lay flat for PD.  She does express that she may not want to do dialysis altogether.  Appreciate palliative care's assistance -Avoid nephrotoxic medications including NSAIDs and iodinated intravenous contrast exposure unless the latter is absolutely indicated.  Preferred narcotic agents for pain control are hydromorphone, fentanyl, and methadone. Morphine should not be used. Avoid Baclofen and avoid oral sodium phosphate and magnesium citrate based laxatives / bowel preps. Continue strict Input and Output monitoring. Will monitor the patient closely with you and intervene or adjust therapy as indicated by changes in clinical status/labs    Severe Hyponatremia -suspecting this is hypovolemic in nature given exam and history -stable 127. Monitor for now. Recommend increased solute intake, discussed with daughter as well  Subacute CVA -per primary   Acidosis -secondary to AKI, monitor for now, supplement once bicarb <16. stable   HFrEF -Euvolemic on exam -diuretics on hold as above.  Continue to hold. Can restart torsemide 40mg  daily PRN on discharge.  Would continue to hold her jardiance, eplerenone, losartan, metolazone for now -Cardiology following   Anemia of CKD -transfuse PRN   Uncontrolled Diabetes Mellitus Type 2 with Hyperglycemia -per primary  Hypokalemia -Improved, watching for now  Discussed with primary service and cardiology. Okay for discharge from a renal perspective. Has an appt as per daughter at our [place] office on 3/10. Will try to arrange for follow up labs sometime this coming week.  Subjective:   Patient seen and examined bedside.  Daughter at bedside.  She reports that she continues to feel well, no complaints.    Objective:   BP (!) 123/56 (BP Location: Right Arm)   Pulse 80   Temp (!) 97.5 F (36.4 C) (Oral)   Resp 16   Ht 5\' 2"  (1.575 m)   Wt 54.9 kg   SpO2 100%   BMI 22.13 kg/m   Intake/Output Summary (Last 24 hours) at 04/29/2023 1208 Last data filed at 04/29/2023 0700 Gross per 24 hour  Intake 600 ml  Output --  Net 600 ml   Weight change:   Physical Exam: Gen: NAD, chronically ill appearing CVS: RRR +systolic mumur Resp: CTA B/L Abd: soft  Ext: no edema Neuro: awake, alert  Imaging: VAS US CAROTID Result Date: 04/28/2023 Carotid Arterial Duplex Study Patient Name:  Emily [name]  Date of Exam:   04/27/2023 Medical Rec #: [id number]        Accession #:    [id number] Date of Birth: Jul 26, 1950        Patient Gender: F Patient Age:   73 years Exam Location:  [facility] Procedure:      VAS US CAROTID  Referring Phys: Osvaldo Shipper --------------------------------------------------------------------------------  Indications:       Subacute CVA. Risk Factors:      Hypertension, Diabetes. Other Factors:     Atrial fibrillation, CKD IV, CHF. Comparison Study:  No prior carotid duplex on file Performing Technologist: Sherren Kerns RVS  Examination Guidelines: A complete evaluation includes B-mode imaging, spectral Doppler, color Doppler, and power Doppler as needed of all accessible  portions of each vessel. Bilateral testing is considered an integral part of a complete examination. Limited examinations for reoccurring indications may be performed as noted.  Right Carotid Findings: +----------+--------+--------+--------+------------------+------------------+           PSV cm/sEDV cm/sStenosisPlaque DescriptionComments           +----------+--------+--------+--------+------------------+------------------+ CCA Prox  83      11                                intimal thickening +----------+--------+--------+--------+------------------+------------------+ CCA Distal87      12                                intimal thickening +----------+--------+--------+--------+------------------+------------------+ ICA Prox  97      21      1-39%   calcific                             +----------+--------+--------+--------+------------------+------------------+ ICA Mid   98      18                                                   +----------+--------+--------+--------+------------------+------------------+ ICA Distal155     33              heterogenous                         +----------+--------+--------+--------+------------------+------------------+ ECA       157     6               calcific                             +----------+--------+--------+--------+------------------+------------------+ +----------+--------+-------+--------+-------------------+           PSV cm/sEDV cmsDescribeArm Pressure (mmHG) +----------+--------+-------+--------+-------------------+ [id number]                                         +----------+--------+-------+--------+-------------------+ +---------+--------+--+--------+--+ VertebralPSV cm/s62EDV cm/s10 +---------+--------+--+--------+--+  Left Carotid Findings: +----------+--------+--------+--------+------------------+------------------+           PSV cm/sEDV cm/sStenosisPlaque DescriptionComments            +----------+--------+--------+--------+------------------+------------------+ CCA Prox  104     14                                intimal thickening +----------+--------+--------+--------+------------------+------------------+ CCA Distal98      12  intimal thickening +----------+--------+--------+--------+------------------+------------------+ ICA Prox  131     22      1-39%   calcific          Shadowing          +----------+--------+--------+--------+------------------+------------------+ ICA Mid   137     18                                                   +----------+--------+--------+--------+------------------+------------------+ ICA Distal139     26              heterogenous                         +----------+--------+--------+--------+------------------+------------------+ ECA       176     2               calcific                             +----------+--------+--------+--------+------------------+------------------+ +----------+--------+--------+---------+-------------------+           PSV cm/sEDV cm/sDescribe Arm Pressure (mmHG) +----------+--------+--------+---------+-------------------+ [id number]             Turbulent                    +----------+--------+--------+---------+-------------------+ +---------+--------+--------+------------+ VertebralPSV cm/sEDV cm/sNot assessed +---------+--------+--------+------------+   Summary: Right Carotid: Velocities in the right ICA are consistent with a 1-39% stenosis. Left Carotid: Velocities in the left ICA are consistent with a 1-39% stenosis. Vertebrals:  Right vertebral artery demonstrates antegrade flow. Left not              assessed. Subclavians: Left subclavian artery flow was disturbed. Normal flow hemodynamics              were seen in the right subclavian artery. *See table(s) above for measurements and observations.  Electronically signed by Carolynn Sayers on 04/28/2023 at 7:19:22 AM.    Final     Labs: BMET Recent Labs  Lab 04/26/23 1134 04/26/23 2009 04/27/23 0600 04/28/23 0641 04/29/23 0355  NA 116* 122* 124* 128* 127*  K 3.4* 3.1* 3.2* 3.5 3.5  CL 74* 81* 85* 93* 92*  CO2 18* 22 21* 20* 21*  GLUCOSE 389* 278* 141* 160* 140*  BUN 147* 142* 143* 127* 108*  CREATININE 4.95* 4.78* 4.36* 2.95* 2.18*  CALCIUM 7.8* 7.6* 7.6* 8.0* 8.7*  PHOS  --  6.8*  --   --   --    CBC Recent Labs  Lab 04/26/23 1134 04/27/23 0600 04/28/23 1328  WBC 9.0 8.3 7.3  NEUTROABS 7.0  --   --   HGB 11.0* 9.2* 9.3*  HCT 32.7* 27.4* 28.4*  MCV 80.0 81.8 83.8  PLT 309 240 234    Medications:     insulin aspart  0-5 Units Subcutaneous QHS   insulin aspart  0-9 Units Subcutaneous TID WC   insulin glargine  8 Units Subcutaneous QHS   levothyroxine  88 mcg Oral QAC breakfast   living well with diabetes book   Does not apply Once   metoprolol succinate  100 mg Oral Daily   senna-docusate  2 tablet Oral BID   warfarin  4 mg Oral ONCE-1600   Warfarin - Pharmacist Dosing Inpatient   Does not apply  [id number]      Anthony Sar, MD [facility] 04/29/2023, 12:08 PM

## 2023-04-29 NOTE — Discharge Planning (Signed)
 Patient alert. IV access removed. Discharge teaching given to patient Emily [name] and her daughter Emily [name]. Both verbalized understanding of teaching including medications. Discharge summary placed in discharge packet and put with patient belongings. Patient will be transported home with her daughter Emily [name].

## 2023-04-29 NOTE — Progress Notes (Addendum)
 [facility] PROGRESS NOTE   SHARIA AVERITT WUJ:[id number] DOB: 1950/05/06 DOA: 04/26/2023  PCP: Glendale Chard, DO  Brief History: 73 y.o. female with a past medical history of chronic kidney disease stage IV, chronic systolic CHF with a EF of 30 to 35% based on echocardiogram in June 2024, permanent atrial fibrillation, hypothyroidism, insulin-dependent diabetes mellitus, essential hypertension who was in her usual state of health till about a month ago when she started feeling nauseated.  Her oral intake has been poor.  She has lost about 15 pounds in the last 1 month.  She went to see her primary care provider.  Subsequently she saw her nephrologist and underwent blood work which revealed significant worsening in her renal function.  She was sent to the hospital.  Subsequently hospitalized.    Consultants: Nephrology.  Cardiology  Procedures: None yet    Subjective/Interval History: Patient feels well.  Wants to go home.  Denies any chest pain shortness of breath nausea or vomiting.  Urinating well.     Assessment/Plan:   Acute kidney injury on chronic kidney disease stage IV Her creatinine was 2.83 in January.  Presented with creatinine of 4.95 and BUN of 147.  All of her diuretics and nephrotoxic agents including ARB were held. Nephrology was consulted. Patient started on IV fluids. Renal function has improved and back to baseline now. CT of the abdomen pelvis did not show any acute findings. Patient mentions that she does not want to consider dialysis.  Nephrology continues to follow.   Hyponatremia/hypokalemia Corrected for glucose her sodium level was 123 at admission.  She was gently started on IV fluids.  Sodium levels have improved.    Concern for subacute stroke on CT head CT head incidentally showed small to moderate size left-sided subacute infarct.  Patient did not have any focal deficits.  She attributes difficulty moving her right leg due to recent fall and  injury to her right knee.   Unfortunately due to ICD she cannot undergo MRI.   CT head was repeated on 2/27 and shows similar findings. Carotid Doppler does not show any significant stenosis. Echocardiogram shows LVEF of 35 to 40% as is known previously with perhaps slight improvement. She remains on warfarin. Patient never had any symptoms of stroke.  LDL is 43.  Continue statin. HbA1c 10.3. Discussed with neurology on 2/28.  All of the workup was reviewed.  Neurology thinks the finding on CT head is chronic rather than subacute.  Patient cannot undergo CT angiogram due to elevated creatinine.  Cannot undergo MRI due to ICD.  Repeat echocardiogram also reviewed.  Transcranial Dopplers will not add any more information and will not change management since patient will need to continue on warfarin.  No further workup at this time. Seen by occupational therapy.  No needs identified.   Diabetes mellitus type 2 in the setting of chronic kidney disease stage IV Continue SSI glargine.  HbA1c is 10.3.   Chronic systolic CHF Current echocardiogram showed LVEF of 35 to 40% with/perhaps slight improvement compared to echo from June 2024.   She has an ICD in situ.  She is followed by heart failure clinic.   Due to her worsening renal function and electrolyte abnormalities we or holding her ARB and diuretics including metolazone and torsemide and eplerenone. Monitor for signs of volume overload Cardiology has seen the patient and does not have any further recommendations. Will need to be off of eplerenone ARB for now.  Nephrology to consider  placing her on oral diuretics today prior to discharge.   History of permanent atrial fibrillation Anticoagulated with warfarin.  INR supratherapeutic at admission and slowly drifted down. Pharmacy is managing..  Drop in hemoglobin is likely dilutional.  No evidence of overt bleeding.  She does admit to seeing some blood in the urine but no hematuria noted on UA.   CT scan did not show any acute findings. Metoprolol was placed on hold due to hypotension. Blood pressures have stabilized.  Should be okay to resume metoprolol at discharge.  Hemoglobin has been stable.   History of hypothyroidism Continue with levothyroxine.   Weight loss Could be just from poor oral intake.  CT of the abdomen pelvis did not show any overt malignancy.  Outpatient monitoring.   Hyperlipidemia Was on statin prior to admission.  LDL is 43.   DVT Prophylaxis: On warfarin Code Status: DNR Family Communication: Discussed with patient.   Disposition Plan: Hopefully discharge later today if cleared by nephrology and cardiology.     Medications: Scheduled:  insulin aspart  0-5 Units Subcutaneous QHS   insulin aspart  0-9 Units Subcutaneous TID WC   insulin glargine  8 Units Subcutaneous QHS   levothyroxine  88 mcg Oral QAC breakfast   living well with diabetes book   Does not apply Once   senna-docusate  2 tablet Oral BID   warfarin  4 mg Oral ONCE-1600   Warfarin - Pharmacist Dosing Inpatient   Does not apply q1600   Continuous:   VHQ:[id number] **OR** acetaminophen, ondansetron **OR** ondansetron (ZOFRAN) IV, mouth rinse   Objective:  Vital Signs  Vitals:   04/28/23 1441 04/28/23 1931 04/29/23 0346 04/29/23 0819  BP: (!) 115/53 (!) 121/58 (!) 114/52 (!) 123/56  Pulse: 80 80 79 80  Resp: 16 18 18 16   Temp: 97.9 F (36.6 C) 97.9 F (36.6 C) 98.2 F (36.8 C) (!) 97.5 F (36.4 C)  TempSrc: Oral Oral Oral Oral  SpO2: 99% 100% 99% 100%  Weight:      Height:        Intake/Output Summary (Last 24 hours) at 04/29/2023 1145 Last data filed at 04/29/2023 0700 Gross per 24 hour  Intake 600 ml  Output --  Net 600 ml   Filed Weights   04/26/23 1114  Weight: 54.9 kg   General appearance: Awake alert.  In no distress Resp: Clear to auscultation bilaterally.  Normal effort Cardio: S1-S2 is normal regular.  No S3-S4.  No rubs murmurs or bruit GI:  Abdomen is soft.  Nontender nondistended.  Bowel sounds are present normal.  No masses organomegaly    Lab Results:  Data Reviewed: I have personally reviewed following labs and reports of the imaging studies  CBC: Recent Labs  Lab 04/26/23 1134 04/27/23 0600 04/28/23 1328  WBC 9.0 8.3 7.3  NEUTROABS 7.0  --   --   HGB 11.0* 9.2* 9.3*  HCT 32.7* 27.4* 28.4*  MCV 80.0 81.8 83.8  PLT 309 240 234    Basic Metabolic Panel: Recent Labs  Lab 04/26/23 1134 04/26/23 2009 04/27/23 0600 04/28/23 0641 04/29/23 0355  NA 116* 122* 124* 128* 127*  K 3.4* 3.1* 3.2* 3.5 3.5  CL 74* 81* 85* 93* 92*  CO2 18* 22 21* 20* 21*  GLUCOSE 389* 278* 141* 160* 140*  BUN 147* 142* 143* 127* 108*  CREATININE 4.95* 4.78* 4.36* 2.95* 2.18*  CALCIUM 7.8* 7.6* 7.6* 8.0* 8.7*  MG 2.3  --   --   --   --  PHOS  --  6.8*  --   --   --     GFR: Estimated Creatinine Clearance: 17.9 mL/min (A) (by C-G formula based on SCr of 2.18 mg/dL (H)).  Liver Function Tests: Recent Labs  Lab 04/26/23 1134 04/26/23 2009  AST 24  --   ALT 14  --   ALKPHOS 91  --   BILITOT 1.9*  --   PROT 7.5  --   ALBUMIN 3.8 3.6    Coagulation Profile: Recent Labs  Lab 04/26/23 1321 04/27/23 0600 04/28/23 0641 04/29/23 0355  INR 5.5* 5.1* 2.4* 1.8*    CBG: Recent Labs  Lab 04/28/23 1610 04/28/23 1932 04/28/23 2112 04/29/23 0619 04/29/23 1111  GLUCAP 254* 253* 242* 141* 201*    Lipid Profile: Recent Labs    04/27/23 0600  CHOL 108  HDL 25*  LDLCALC 43  TRIG 161*  CHOLHDL 4.3    Thyroid Function Tests: Recent Labs    04/26/23 2009  TSH 0.505     Radiology Studies: VAS US CAROTID Result Date: 04/28/2023 Carotid Arterial Duplex Study Patient Name:  SALLE BRANDLE  Date of Exam:   04/27/2023 Medical Rec #: [id number]        Accession #:    [id number] Date of Birth: 1950-05-04        Patient Gender: F Patient Age:   65 years Exam Location:  [facility] Procedure:      VAS US CAROTID  Referring Phys: Osvaldo Shipper --------------------------------------------------------------------------------  Indications:       Subacute CVA. Risk Factors:      Hypertension, Diabetes. Other Factors:     Atrial fibrillation, CKD IV, CHF. Comparison Study:  No prior carotid duplex on file Performing Technologist: Sherren Kerns RVS  Examination Guidelines: A complete evaluation includes B-mode imaging, spectral Doppler, color Doppler, and power Doppler as needed of all accessible portions of each vessel. Bilateral testing is considered an integral part of a complete examination. Limited examinations for reoccurring indications may be performed as noted.  Right Carotid Findings: +----------+--------+--------+--------+------------------+------------------+           PSV cm/sEDV cm/sStenosisPlaque DescriptionComments           +----------+--------+--------+--------+------------------+------------------+ CCA Prox  83      11                                intimal thickening +----------+--------+--------+--------+------------------+------------------+ CCA Distal87      12                                intimal thickening +----------+--------+--------+--------+------------------+------------------+ ICA Prox  97      21      1-39%   calcific                             +----------+--------+--------+--------+------------------+------------------+ ICA Mid   98      18                                                   +----------+--------+--------+--------+------------------+------------------+ ICA Distal155     33              heterogenous                         +----------+--------+--------+--------+------------------+------------------+  ECA       157     6               calcific                             +----------+--------+--------+--------+------------------+------------------+ +----------+--------+-------+--------+-------------------+           PSV cm/sEDV  cmsDescribeArm Pressure (mmHG) +----------+--------+-------+--------+-------------------+ [id number]                                         +----------+--------+-------+--------+-------------------+ +---------+--------+--+--------+--+ VertebralPSV cm/s62EDV cm/s10 +---------+--------+--+--------+--+  Left Carotid Findings: +----------+--------+--------+--------+------------------+------------------+           PSV cm/sEDV cm/sStenosisPlaque DescriptionComments           +----------+--------+--------+--------+------------------+------------------+ CCA Prox  104     14                                intimal thickening +----------+--------+--------+--------+------------------+------------------+ CCA Distal98      12                                intimal thickening +----------+--------+--------+--------+------------------+------------------+ ICA Prox  131     22      1-39%   calcific          Shadowing          +----------+--------+--------+--------+------------------+------------------+ ICA Mid   137     18                                                   +----------+--------+--------+--------+------------------+------------------+ ICA Distal139     26              heterogenous                         +----------+--------+--------+--------+------------------+------------------+ ECA       176     2               calcific                             +----------+--------+--------+--------+------------------+------------------+ +----------+--------+--------+---------+-------------------+           PSV cm/sEDV cm/sDescribe Arm Pressure (mmHG) +----------+--------+--------+---------+-------------------+ [id number]             Turbulent                    +----------+--------+--------+---------+-------------------+ +---------+--------+--------+------------+ VertebralPSV cm/sEDV cm/sNot assessed +---------+--------+--------+------------+    Summary: Right Carotid: Velocities in the right ICA are consistent with a 1-39% stenosis. Left Carotid: Velocities in the left ICA are consistent with a 1-39% stenosis. Vertebrals:  Right vertebral artery demonstrates antegrade flow. Left not              assessed. Subclavians: Left subclavian artery flow was disturbed. Normal flow hemodynamics              were seen in the right subclavian artery. *See table(s) above for measurements and observations.  Electronically signed by Carolynn Sayers on 04/28/2023 at 7:19:22 AM.  Final        LOS: 3 days   Danialle Dement Rito Ehrlich  [facility] Pager on [email or web address]  04/29/2023, 11:45 AM

## 2023-04-29 NOTE — Progress Notes (Signed)
 PHARMACY - ANTICOAGULATION CONSULT NOTE  Pharmacy Consult for warfarin Indication: atrial fibrillation  Allergies  Allergen Reactions   Amiodarone Rash   Other Palpitations    PT STATES SHE GETS REALLY HOT , DIZZY HEADED, HAS PASSED OUT TWICE , AND ITCHING WITH IV DYE   Codeine Other (See Comments)    Euphoria, hallucinations   Turmeric     Pt went into AFIB    Colchicine Rash   Iodine Rash    topical   Ivp Dye [Iodinated Contrast Media] Rash   Mexitil [Mexiletine] Rash    Patient Measurements: Height: 5\' 2"  (157.5 cm) Weight: 54.9 kg (121 lb) IBW/kg (Calculated) : 50.1  Vital Signs: Temp: 97.5 F (36.4 C) (03/01 0819) Temp Source: Oral (03/01 0819) BP: 123/56 (03/01 0819) Pulse Rate: 80 (03/01 0819)  Labs: Recent Labs    04/26/23 1134 04/26/23 1321 04/27/23 0600 04/28/23 0641 04/28/23 1328 04/29/23 0355  HGB 11.0*  --  9.2*  --  9.3*  --   HCT 32.7*  --  27.4*  --  28.4*  --   PLT 309  --  240  --  234  --   LABPROT  --    < > 47.5* 26.4*  --  21.5*  INR  --    < > 5.1* 2.4*  --  1.8*  CREATININE 4.95*   < > 4.36* 2.95*  --  2.18*   < > = values in this interval not displayed.    Estimated Creatinine Clearance: 17.9 mL/min (A) (by C-G formula based on SCr of 2.18 mg/dL (H)).   Medical History: Past Medical History:  Diagnosis Date   Acute pain of right shoulder 03/22/2019   Acute renal failure (HCC) 04/15/2012   Last Assessment & Plan:  Superimposed on CK D stage III, Baseline Cr ~2-2.5, remains above baseline, felt to be due to Cardiorenal syndrome, creatinine trending down at time of discharge Negative urine eos and no urinary retention ACE inhibitor discontinued Follows with Dr Julien Girt as outpatient, to have labs drawn 08/15/16 and results sent to [facility] office for review   AICD (automatic cardioverter/defibrillator) present    Anemia 12/29/2016   Aortic stenosis    Arthritis    hands   ASD (atrial septal defect)    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   Asthma    as a child   Atrial fibrillation with RVR (HCC)    Biventricular ICD (implantable cardioverter-defibrillator) in place    CHF (congestive heart failure) (HCC)    CKD (chronic kidney disease), stage IV (HCC)    Colitis 09/11/2012   Complete heart block (HCC)    Dyspnea    Essential hypertension    GERD (gastroesophageal reflux disease)    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 Physical exam revealed cerumen impaction of left ear canal. Relieved by flushing.    Hypomagnesemia 04/10/2016   Hyponatremia 04/10/2016   Last Assessment & Plan:  - improved after tolvaptan 6/29, chiefly driven by CHF, further improvement with ongoing IV Lasix - continue low Na and fluid restriction - compliance with sodium restriction a major barrier to keeping patient out of hospital and compensated, remains resistant to this philosophy while inpatient - follows with Dr  Hamerski as outpatient, Neph consult not indicated at this ti   Hypothyroidism    ICD (implantable cardioverter-defibrillator) battery depletion 04/21/2014   Localized macular rash 08/11/2016   Last Assessment & Plan:  Seems to trend with heart failure, though she has more findings behind bilateral knees and up into her thigh/groin area than when I last saw her Would use steroid cream for now, consider alternative diagnosis if no improvement   Long-term insulin use in type 2 diabetes (HCC) 04/10/2016   Last Assessment & Plan:  Hypoglycemia resolved, continue SSI and hypoglycemia protocol   Mitral regurgitation    Nonischemic cardiomyopathy (HCC)    On amiodarone therapy 08/21/2017   Persistent atrial fibrillation (HCC)    AV node ablation 2019   Pneumonia    PONV (postoperative nausea and  vomiting)    has awakened during surgery   Restless leg syndrome 03/22/2019   Type 2 diabetes mellitus (HCC)     Assessment: 27 YOF presenting with nausea x2wk and ppor oral intake, hx of permanent afib on warfarin PTA. Last dose 04/25/23. INR on admission is supratherapeutic at 5.5. Last dose PTA on 2/25.   PTA  Warfarin 4mg  TTSa, 2 mg all other days (20 mg/wk)  INR down to 1.8 from 2.4 yesterday. Eating 100% of meals. Warfarin restarted yesterday with 3 mg x1 so far. INR drop today is not unexpected given held doses prior. Will give boosted home dose today.  Goal of Therapy:  INR 2-3 Monitor platelets by anticoagulation protocol: Yes   Plan:  Warfarin 4mg  x1 Monitor daily INR, CBC, signs/symptoms of bleeding   Thank you for allowing pharmacy to be a part of this patient's care.  Lora Paula, PharmD PGY-2 Infectious Diseases Pharmacy Resident [facility] 04/29/2023 9:29 AM  Please check AMION for all [facility] Pharmacy phone numbers After 10:00 PM, call Main Pharmacy [phone]

## 2023-04-29 NOTE — Progress Notes (Signed)
 Rounding Note    Patient Name: Emily [name] Date of Encounter: 04/29/2023  [facility] Cardiologist: Marca Ancona, MD   Subjective   BP 123/56.  Renal function continues to improve (4.36 > 2.95 > 2.18).  Sodium stable at 127.  Denies any chest pain or dyspnea  Inpatient Medications    Scheduled Meds:  insulin aspart  0-5 Units Subcutaneous QHS   insulin aspart  0-9 Units Subcutaneous TID WC   insulin glargine  8 Units Subcutaneous QHS   levothyroxine  88 mcg Oral QAC breakfast   living well with diabetes book   Does not apply Once   senna-docusate  2 tablet Oral BID   warfarin  4 mg Oral ONCE-1600   Warfarin - Pharmacist Dosing Inpatient   Does not apply q1600   Continuous Infusions:  PRN Meds: acetaminophen **OR** acetaminophen, ondansetron **OR** ondansetron (ZOFRAN) IV, mouth rinse   Vital Signs    Vitals:   04/28/23 1441 04/28/23 1931 04/29/23 0346 04/29/23 0819  BP: (!) 115/53 (!) 121/58 (!) 114/52 (!) 123/56  Pulse: 80 80 79 80  Resp: 16 18 18 16   Temp: 97.9 F (36.6 C) 97.9 F (36.6 C) 98.2 F (36.8 C) (!) 97.5 F (36.4 C)  TempSrc: Oral Oral Oral Oral  SpO2: 99% 100% 99% 100%  Weight:      Height:        Intake/Output Summary (Last 24 hours) at 04/29/2023 1124 Last data filed at 04/29/2023 0700 Gross per 24 hour  Intake 840 ml  Output --  Net 840 ml      04/26/2023   11:14 AM 03/13/2023   12:32 PM 02/14/2023    8:47 AM  Last 3 Weights  Weight (lbs) 121 lb 121 lb 129 lb  Weight (kg) 54.885 kg 54.885 kg 58.514 kg      Telemetry    Vpaced - Personally Reviewed  ECG    No new ECG - Personally Reviewed  Physical Exam   GEN: No acute distress.   Neck: No JVD Cardiac: RRR, no murmurs, rubs, or gallops.  Respiratory: Clear to auscultation bilaterally. GI: Soft, nontender, non-distended  MS: No edema; No deformity. Neuro:  Nonfocal  Psych: Normal affect   Labs    High Sensitivity Troponin:  No results for input(s):  "TROPONINIHS" in the last 720 hours.   Chemistry Recent Labs  Lab 04/26/23 1134 04/26/23 2009 04/27/23 0600 04/28/23 0641 04/29/23 0355  NA 116* 122* 124* 128* 127*  K 3.4* 3.1* 3.2* 3.5 3.5  CL 74* 81* 85* 93* 92*  CO2 18* 22 21* 20* 21*  GLUCOSE 389* 278* 141* 160* 140*  BUN 147* 142* 143* 127* 108*  CREATININE 4.95* 4.78* 4.36* 2.95* 2.18*  CALCIUM 7.8* 7.6* 7.6* 8.0* 8.7*  MG 2.3  --   --   --   --   PROT 7.5  --   --   --   --   ALBUMIN 3.8 3.6  --   --   --   AST 24  --   --   --   --   ALT 14  --   --   --   --   ALKPHOS 91  --   --   --   --   BILITOT 1.9*  --   --   --   --   GFRNONAA 9* 9* 10* 16* 23*  ANIONGAP 24* 19* 18* 15 14    Lipids  Recent Labs  Lab 04/27/23 0600  CHOL 108  TRIG 201*  HDL 25*  LDLCALC 43  CHOLHDL 4.3    Hematology Recent Labs  Lab 04/26/23 1134 04/27/23 0600 04/28/23 1328  WBC 9.0 8.3 7.3  RBC 4.09 3.35* 3.39*  HGB 11.0* 9.2* 9.3*  HCT 32.7* 27.4* 28.4*  MCV 80.0 81.8 83.8  MCH 26.9 27.5 27.4  MCHC 33.6 33.6 32.7  RDW 18.0* 18.4* 18.6*  PLT 309 240 234   Thyroid  Recent Labs  Lab 04/26/23 2009  TSH 0.505    BNPNo results for input(s): "BNP", "PROBNP" in the last 168 hours.  DDimer No results for input(s): "DDIMER" in the last 168 hours.   Radiology    VAS US CAROTID Result Date: 04/28/2023 Carotid Arterial Duplex Study Patient Name:  MANUELITA MOXON  Date of Exam:   04/27/2023 Medical Rec #: [id number]        Accession #:    [id number] Date of Birth: April 02, 1950        Patient Gender: F Patient Age:   61 years Exam Location:  [facility] Procedure:      VAS US CAROTID Referring Phys: Osvaldo Shipper --------------------------------------------------------------------------------  Indications:       Subacute CVA. Risk Factors:      Hypertension, Diabetes. Other Factors:     Atrial fibrillation, CKD IV, CHF. Comparison Study:  No prior carotid duplex on file Performing Technologist: Sherren Kerns RVS  Examination  Guidelines: A complete evaluation includes B-mode imaging, spectral Doppler, color Doppler, and power Doppler as needed of all accessible portions of each vessel. Bilateral testing is considered an integral part of a complete examination. Limited examinations for reoccurring indications may be performed as noted.  Right Carotid Findings: +----------+--------+--------+--------+------------------+------------------+           PSV cm/sEDV cm/sStenosisPlaque DescriptionComments           +----------+--------+--------+--------+------------------+------------------+ CCA Prox  83      11                                intimal thickening +----------+--------+--------+--------+------------------+------------------+ CCA Distal87      12                                intimal thickening +----------+--------+--------+--------+------------------+------------------+ ICA Prox  97      21      1-39%   calcific                             +----------+--------+--------+--------+------------------+------------------+ ICA Mid   98      18                                                   +----------+--------+--------+--------+------------------+------------------+ ICA Distal155     33              heterogenous                         +----------+--------+--------+--------+------------------+------------------+ ECA       157     6               calcific                             +----------+--------+--------+--------+------------------+------------------+ +----------+--------+-------+--------+-------------------+  PSV cm/sEDV cmsDescribeArm Pressure (mmHG) +----------+--------+-------+--------+-------------------+ [id number]                                         +----------+--------+-------+--------+-------------------+ +---------+--------+--+--------+--+ VertebralPSV cm/s62EDV cm/s10 +---------+--------+--+--------+--+  Left Carotid Findings:  +----------+--------+--------+--------+------------------+------------------+           PSV cm/sEDV cm/sStenosisPlaque DescriptionComments           +----------+--------+--------+--------+------------------+------------------+ CCA Prox  104     14                                intimal thickening +----------+--------+--------+--------+------------------+------------------+ CCA Distal98      12                                intimal thickening +----------+--------+--------+--------+------------------+------------------+ ICA Prox  131     22      1-39%   calcific          Shadowing          +----------+--------+--------+--------+------------------+------------------+ ICA Mid   137     18                                                   +----------+--------+--------+--------+------------------+------------------+ ICA Distal139     26              heterogenous                         +----------+--------+--------+--------+------------------+------------------+ ECA       176     2               calcific                             +----------+--------+--------+--------+------------------+------------------+ +----------+--------+--------+---------+-------------------+           PSV cm/sEDV cm/sDescribe Arm Pressure (mmHG) +----------+--------+--------+---------+-------------------+ [id number]             Turbulent                    +----------+--------+--------+---------+-------------------+ +---------+--------+--------+------------+ VertebralPSV cm/sEDV cm/sNot assessed +---------+--------+--------+------------+   Summary: Right Carotid: Velocities in the right ICA are consistent with a 1-39% stenosis. Left Carotid: Velocities in the left ICA are consistent with a 1-39% stenosis. Vertebrals:  Right vertebral artery demonstrates antegrade flow. Left not              assessed. Subclavians: Left subclavian artery flow was disturbed. Normal flow  hemodynamics              were seen in the right subclavian artery. *See table(s) above for measurements and observations.  Electronically signed by Carolynn Sayers on 04/28/2023 at 7:19:22 AM.    Final     Cardiac Studies     Patient Profile     73 y.o. female with a history of mild non-obstructive CAD on cardiac catheterization in 11/2020, non-ischemic cardiomyopathy/ chronic HFrEF with EF of 20-25% s/p CRT-D, permanent atrial fibrillation s/p AV nodal ablation in 2019 on Coumadin, frequent PVCs, moderate low flow/ low gradient aortic stenosis, moderate to severe mitral  regurgitation, ASD, hypertension, type 2 diabetes mellitus, CKD stage IV, GERD, and gout who presents with AKI  Assessment & Plan   AKI on CKD stage IV: Baseline creatinine 2.8-3.2, was 4.95 on admission.  Nephrology consulted, felt to likely be prerenal and started on IV fluids.  Renal function improved with IV fluids, creatinine back to baseline  Hyponatremia: Severe hyponatremia, sodium 116 on admission.  Improving with IV fluids, up to 127 today  Chronic combined heart failure: Patient has a long history of non-ischemic cardiomyopathy dating back to 1988. 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 EF in 07/2022 was 30-35%. Patient was admitted for AKI and severe hyponatremia in setting of nausea and poor PO intake. Echo this admission shows 35-40% with global hypokinesis, normal RV function, mild to moderate MR, and mild to moderate low flow/ low gradient AS.  -Appeared dry on exam and with worsening renal function, held home diuretics and GDMT.  Home regimen is Jardiance 10 mg daily, eplerenone 25 mg daily, losartan 12.5 mg daily, metolazone 2.5 mg once per week, Toprol-XL 100 mg daily, torsemide 80 mg daily.  Will add back home metoprolol and discharge on as needed torsemide; recommend monitoring daily weights and can take torsemide if gains more than 3lbs in 1 day or 5lbs in 1  week  CAD: Mild nonobstructive CAD on cath 11/2020.  Denies any chest pain.  Continue Coumadin, statin  Permanent atrial fibrillation: Status post AV nodal ablation with BiV pacing in 2019.  Coumadin dosing per pharmacy.  Aortic stenosis: Echo this admission with mild to moderate low-flow low gradient AS.  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  Not felt to be large enough for repair  Disposition: planning discharge today.  Will restart home toprol XL.  Can hold home jardiance, eplerenone, losartan, and metolazone for now.  Would recommend torsemide 40 mg daily as needed.  Would monitor daily weights and can take torsemide if gains more than 3lbs in one day or 5lbs in one week.  Will schedule close cardiology f/u  For questions or updates, please contact [facility] Please consult [email or web address] for contact info under        Signed, Little Ishikawa, MD  04/29/2023, 11:24 AM

## 2023-04-29 NOTE — Discharge Summary (Signed)
 [facility]  Physician Discharge Summary   Patient ID: DEZARIA METHOT MRN: [id number] DOB/AGE: 10-25-1950 73 y.o.  Admit date: 04/26/2023 Discharge date: 04/29/2023    PCP: Glendale Chard, DO  DISCHARGE DIAGNOSES:    AKI (acute kidney injury) [facility])   ICD (implantable cardioverter-defibrillator) in place   CKD (chronic kidney disease) stage 4, GFR 15-29 ml/min (HCC)   Type 2 diabetes mellitus with diabetic chronic kidney disease (HCC)   Hyponatremia   Chronic systolic heart failure (HCC)   RECOMMENDATIONS FOR OUTPATIENT FOLLOW UP: Outpatient follow-up with cardiology and nephrology Needs blood work to check electrolytes and renal function at follow-up   Home Health: None Equipment/Devices: None  CODE STATUS: Full code  DISCHARGE CONDITION: fair  Diet recommendation: As before  INITIAL HISTORY:  73 y.o. female with a past medical history of chronic kidney disease stage IV, chronic systolic CHF with a EF of 30 to 35% based on echocardiogram in June 2024, permanent atrial fibrillation, hypothyroidism, insulin-dependent diabetes mellitus, essential hypertension who was in her usual state of health till about a month ago when she started feeling nauseated.  Her oral intake has been poor.  She has lost about 15 pounds in the last 1 month.  She went to see her primary care provider.  Subsequently she saw her nephrologist and underwent blood work which revealed significant worsening in her renal function.  She was sent to the hospital.  Subsequently hospitalized.     Consultants: Nephrology.  Cardiology   Procedures: None   HOSPITAL COURSE:   Acute kidney injury on chronic kidney disease stage IV Her creatinine was 2.83 in January.  Presented with creatinine of 4.95 and BUN of 147.  All of her diuretics and nephrotoxic agents including ARB were held. Nephrology was consulted. Patient started on IV fluids. Renal function has improved and back to baseline now. CT of the  abdomen pelvis did not show any acute findings. Patient mentions that she does not want to consider dialysis.   Cleared for discharge since her renal function is back to baseline.  Will be discharged on torsemide 40 mg as needed.  Instructions provided.   Hyponatremia/hypokalemia Corrected for glucose her sodium level was 123 at admission.  She was gently started on IV fluids.  Sodium levels have improved.     Concern for subacute stroke on CT head CT head incidentally showed small to moderate size left-sided subacute infarct.  Patient did not have any focal deficits.  She attributes difficulty moving her right leg due to recent fall and injury to her right knee.   Unfortunately due to ICD she cannot undergo MRI.   CT head was repeated on 2/27 and shows similar findings. Carotid Doppler does not show any significant stenosis. Echocardiogram shows LVEF of 35 to 40% as is known previously with perhaps slight improvement. She remains on warfarin. Patient never had any symptoms of stroke.  LDL is 43.  Continue statin. HbA1c 10.3. Discussed with neurology on 2/28.  All of the workup was reviewed.  Neurology thinks the finding on CT head is chronic rather than subacute.  Patient cannot undergo CT angiogram due to elevated creatinine.  Cannot undergo MRI due to ICD.  Repeat echocardiogram also reviewed.  Transcranial Dopplers will not add any more information and will not change management since patient will need to continue on warfarin.  No further workup at this time. Seen by occupational therapy.  No needs identified.   Diabetes mellitus type 2 in the setting of  chronic kidney disease stage IV HbA1c is 10.3.   Chronic systolic CHF Current echocardiogram showed LVEF of 35 to 40% with/perhaps slight improvement compared to echo from June 2024.   She has an ICD in situ.  She is followed by heart failure clinic.   Due to her worsening renal function and electrolyte abnormalities we or holding her  ARB and diuretics including metolazone and torsemide and eplerenone. Will be discharged just on torsemide as needed.  Okay to resume metoprolol.  All of her other cardiac medications will be held.  Cardiology to arrange close outpatient follow-up.   History of permanent atrial fibrillation Anticoagulated with warfarin.  INR supratherapeutic at admission and slowly drifted down. Drop in hemoglobin is likely dilutional.  No evidence of overt bleeding.  She does admit to seeing some blood in the urine but no hematuria noted on UA.  CT scan did not show any acute findings. Metoprolol resumed at discharge.   History of hypothyroidism Continue with levothyroxine.   Weight loss Could be just from poor oral intake.  CT of the abdomen pelvis did not show any overt malignancy.  Outpatient monitoring.   Hyperlipidemia Was on statin prior to admission.  LDL is 43.  Patient has improved.  Okay for discharge home today.  PERTINENT LABS:  The results of significant diagnostics from this hospitalization (including imaging, microbiology, ancillary and laboratory) are listed below for reference.      Labs:   Basic Metabolic Panel: Recent Labs  Lab 04/26/23 1134 04/26/23 2009 04/27/23 0600 04/28/23 0641 04/29/23 0355  NA 116* 122* 124* 128* 127*  K 3.4* 3.1* 3.2* 3.5 3.5  CL 74* 81* 85* 93* 92*  CO2 18* 22 21* 20* 21*  GLUCOSE 389* 278* 141* 160* 140*  BUN 147* 142* 143* 127* 108*  CREATININE 4.95* 4.78* 4.36* 2.95* 2.18*  CALCIUM 7.8* 7.6* 7.6* 8.0* 8.7*  MG 2.3  --   --   --   --   PHOS  --  6.8*  --   --   --    Liver Function Tests: Recent Labs  Lab 04/26/23 1134 04/26/23 2009  AST 24  --   ALT 14  --   ALKPHOS 91  --   BILITOT 1.9*  --   PROT 7.5  --   ALBUMIN 3.8 3.6    CBC: Recent Labs  Lab 04/26/23 1134 04/27/23 0600 04/28/23 1328  WBC 9.0 8.3 7.3  NEUTROABS 7.0  --   --   HGB 11.0* 9.2* 9.3*  HCT 32.7* 27.4* 28.4*  MCV 80.0 81.8 83.8  PLT 309 240 234      CBG: Recent Labs  Lab 04/28/23 1610 04/28/23 1932 04/28/23 2112 04/29/23 0619 04/29/23 1111  GLUCAP 254* 253* 242* 141* 201*     IMAGING STUDIES VAS US CAROTID Result Date: 04/28/2023 Carotid Arterial Duplex Study Patient Name:  IVANNA KOCAK  Date of Exam:   04/27/2023 Medical Rec #: [id number]        Accession #:    [id number] Date of Birth: November 19, 1950        Patient Gender: F Patient Age:   86 years Exam Location:  [facility] Procedure:      VAS US CAROTID Referring Phys: Osvaldo Shipper --------------------------------------------------------------------------------  Indications:       Subacute CVA. Risk Factors:      Hypertension, Diabetes. Other Factors:     Atrial fibrillation, CKD IV, CHF. Comparison Study:  No prior carotid duplex on file  Performing Technologist: Sherren Kerns RVS  Examination Guidelines: A complete evaluation includes B-mode imaging, spectral Doppler, color Doppler, and power Doppler as needed of all accessible portions of each vessel. Bilateral testing is considered an integral part of a complete examination. Limited examinations for reoccurring indications may be performed as noted.  Right Carotid Findings: +----------+--------+--------+--------+------------------+------------------+           PSV cm/sEDV cm/sStenosisPlaque DescriptionComments           +----------+--------+--------+--------+------------------+------------------+ CCA Prox  83      11                                intimal thickening +----------+--------+--------+--------+------------------+------------------+ CCA Distal87      12                                intimal thickening +----------+--------+--------+--------+------------------+------------------+ ICA Prox  97      21      1-39%   calcific                             +----------+--------+--------+--------+------------------+------------------+ ICA Mid   98      18                                                    +----------+--------+--------+--------+------------------+------------------+ ICA Distal155     33              heterogenous                         +----------+--------+--------+--------+------------------+------------------+ ECA       157     6               calcific                             +----------+--------+--------+--------+------------------+------------------+ +----------+--------+-------+--------+-------------------+           PSV cm/sEDV cmsDescribeArm Pressure (mmHG) +----------+--------+-------+--------+-------------------+ [id number]                                         +----------+--------+-------+--------+-------------------+ +---------+--------+--+--------+--+ VertebralPSV cm/s62EDV cm/s10 +---------+--------+--+--------+--+  Left Carotid Findings: +----------+--------+--------+--------+------------------+------------------+           PSV cm/sEDV cm/sStenosisPlaque DescriptionComments           +----------+--------+--------+--------+------------------+------------------+ CCA Prox  104     14                                intimal thickening +----------+--------+--------+--------+------------------+------------------+ CCA Distal98      12                                intimal thickening +----------+--------+--------+--------+------------------+------------------+ ICA Prox  131     22      1-39%   calcific          Shadowing          +----------+--------+--------+--------+------------------+------------------+ ICA Mid  137     18                                                   +----------+--------+--------+--------+------------------+------------------+ ICA Distal139     26              heterogenous                         +----------+--------+--------+--------+------------------+------------------+ ECA       176     2               calcific                              +----------+--------+--------+--------+------------------+------------------+ +----------+--------+--------+---------+-------------------+           PSV cm/sEDV cm/sDescribe Arm Pressure (mmHG) +----------+--------+--------+---------+-------------------+ [id number]             Turbulent                    +----------+--------+--------+---------+-------------------+ +---------+--------+--------+------------+ VertebralPSV cm/sEDV cm/sNot assessed +---------+--------+--------+------------+   Summary: Right Carotid: Velocities in the right ICA are consistent with a 1-39% stenosis. Left Carotid: Velocities in the left ICA are consistent with a 1-39% stenosis. Vertebrals:  Right vertebral artery demonstrates antegrade flow. Left not              assessed. Subclavians: Left subclavian artery flow was disturbed. Normal flow hemodynamics              were seen in the right subclavian artery. *See table(s) above for measurements and observations.  Electronically signed by Carolynn Sayers on 04/28/2023 at 7:19:22 AM.    Final    ECHOCARDIOGRAM COMPLETE Result Date: 04/27/2023    ECHOCARDIOGRAM REPORT   Patient Name:   TERRIAH REGGIO Date of Exam: 04/27/2023 Medical Rec #:  [id number]       Height:       62.0 in Accession #:    [id number]      Weight:       121.0 lb Date of Birth:  1950/04/20       BSA:          1.544 m Patient Age:    72 years        BP:           120/59 mmHg Patient Gender: F               HR:           80 bpm. Exam Location:  Inpatient Procedure: 2D Echo, Cardiac Doppler, Color Doppler and Intracardiac            Opacification Agent (Both Spectral and Color Flow Doppler were            utilized during procedure). Indications:    Stroke I63.9  History:        Patient has prior history of Echocardiogram examinations, most                 recent 04/28/2022. CHF and Cardiomyopathy, Stroke, Mitral Valve                 Disease; Risk Factors:Hypertension and Diabetes.  Sonographer:    Webb Laws Referring Phys: [id number] Osvaldo Shipper IMPRESSIONS  1. Left ventricular ejection fraction, by estimation, is 35 to 40%. The left ventricle has moderately decreased function. The left ventricle demonstrates global hypokinesis. The left ventricular internal cavity size was dilated. Left ventricular diastolic parameters are indeterminate.  2. Right ventricular systolic function is normal. The right ventricular size is normal.  3. The mitral valve is degenerative. Mild to moderate mitral valve regurgitation.  4. Decreased stroke volume index, DVI 0.34. Likely a degree of low flow low gradient aortic stenosis. The aortic valve was not well visualized. Aortic valve regurgitation is not visualized. Mild to moderate aortic valve stenosis.  5. The inferior vena cava is dilated in size with >50% respiratory variability, suggesting right atrial pressure of 8 mmHg. Comparison(s): No significant change from prior study. Prior images reviewed side by side. FINDINGS  Left Ventricle: Left ventricular ejection fraction, by estimation, is 35 to 40%. The left ventricle has moderately decreased function. The left ventricle demonstrates global hypokinesis. Definity contrast agent was given IV to delineate the left ventricular endocardial borders. Strain imaging was not performed. The left ventricular internal cavity size was dilated. There is no left ventricular hypertrophy. Left ventricular diastolic parameters are indeterminate. Right Ventricle: The right ventricular size is normal. No increase in right ventricular wall thickness. Right ventricular systolic function is normal. Left Atrium: Left atrial size was normal in size. Right Atrium: Right atrial size was normal in size. Pericardium: There is no evidence of pericardial effusion. Mitral Valve: The mitral valve is degenerative in appearance. Mild to moderate mitral valve regurgitation. Tricuspid Valve: The tricuspid valve is normal in structure. Tricuspid valve regurgitation  is trivial. No evidence of tricuspid stenosis. Aortic Valve: Decreased stroke volume index, DVI 0.34. Likely a degree of low flow low gradient aortic stenosis. The aortic valve was not well visualized. Aortic valve regurgitation is not visualized. Mild to moderate aortic stenosis is present. Aortic valve mean gradient measures 13.2 mmHg. Aortic valve peak gradient measures 22.7 mmHg. Aortic valve area, by VTI measures 0.67 cm. Pulmonic Valve: The pulmonic valve was normal in structure. Pulmonic valve regurgitation is not visualized. No evidence of pulmonic stenosis. Aorta: The aortic root is normal in size and structure. Venous: The inferior vena cava is dilated in size with greater than 50% respiratory variability, suggesting right atrial pressure of 8 mmHg. IAS/Shunts: The interatrial septum was not well visualized. Additional Comments: 3D imaging was not performed. A device lead is visualized in the right ventricle and right atrium.  LEFT VENTRICLE PLAX 2D LVIDd:         5.30 cm      Diastology LVIDs:         4.90 cm      LV e' medial:    5.22 cm/s LV PW:         0.90 cm      LV E/e' medial:  25.9 LV IVS:        1.00 cm      LV e' lateral:   7.29 cm/s LVOT diam:     1.60 cm      LV E/e' lateral: 18.5 LV SV:         37 LV SV Index:   24 LVOT Area:     2.01 cm  LV Volumes (MOD) LV vol d, MOD A2C: 90.8 ml LV vol d, MOD A4C: 100.0 ml LV vol s, MOD A2C: 62.7 ml LV vol s, MOD A4C: 56.1 ml LV SV MOD A2C:     28.1 ml LV SV MOD  A4C:     100.0 ml LV SV MOD BP:      38.9 ml RIGHT VENTRICLE            IVC RV Basal diam:  3.40 cm    IVC diam: 2.20 cm RV S prime:     6.42 cm/s TAPSE (M-mode): 1.8 cm LEFT ATRIUM             Index        RIGHT ATRIUM           Index LA diam:        3.70 cm 2.40 cm/m   RA Area:     13.70 cm LA Vol (A2C):   23.7 ml 15.35 ml/m  RA Volume:   31.70 ml  20.53 ml/m LA Vol (A4C):   45.2 ml 29.27 ml/m LA Biplane Vol: 34.8 ml 22.54 ml/m  AORTIC VALVE AV Area (Vmax):    0.77 cm AV Area (Vmean):    0.81 cm AV Area (VTI):     0.67 cm AV Vmax:           238.25 cm/s AV Vmean:          170.750 cm/s AV VTI:            0.546 m AV Peak Grad:      22.7 mmHg AV Mean Grad:      13.2 mmHg LVOT Vmax:         90.70 cm/s LVOT Vmean:        68.600 cm/s LVOT VTI:          0.183 m LVOT/AV VTI ratio: 0.34  AORTA Ao Asc diam: 2.30 cm MITRAL VALVE                  TRICUSPID VALVE MV Area (PHT): 3.76 cm       TR Peak grad:   21.3 mmHg MV Decel Time: 202 msec       TR Vmax:        231.00 cm/s MR Peak grad:    83.9 mmHg MR Mean grad:    49.0 mmHg    SHUNTS MR Vmax:         458.00 cm/s  Systemic VTI:  0.18 m MR Vmean:        330.0 cm/s   Systemic Diam: 1.60 cm MR PISA:         1.01 cm MR PISA Eff ROA: 7 mm MR PISA Radius:  0.40 cm MV E velocity: 135.00 cm/s Riley Lam MD Electronically signed by Riley Lam MD Signature Date/Time: 04/27/2023/12:54:19 PM    Final    CT HEAD WO CONTRAST ( ) Result Date: 04/27/2023 CLINICAL DATA:  Stroke follow-up EXAM: CT HEAD WITHOUT CONTRAST TECHNIQUE: Contiguous axial images were obtained from the base of the skull through the vertex without intravenous contrast. RADIATION DOSE REDUCTION: This exam was performed according to the departmental dose-optimization program which includes automated exposure control, adjustment of the mA and/or kV according to patient size and/or use of iterative reconstruction technique. COMPARISON:  CT head 04/26/2023 FINDINGS: Brain: Similar appearance of hypoattenuation in the left parieto-occipital lobes suggestive of possible subacute infarct. No evidence of hemorrhagic conversion. No additional areas of infarct appreciated. Nonspecific hypoattenuation in the periventricular and subcortical white matter favored to reflect chronic microvascular ischemic changes. No edema, mass effect, or midline shift. The basilar cisterns are patent. Ventricles: The ventricles are normal. Vascular: Atherosclerotic calcifications of the carotid siphons and  intracranial vertebral arteries.  No hyperdense vessel. Skull: No acute or aggressive finding. Orbits: Orbits are symmetric. Sinuses: The visualized paranasal sinuses are clear. Other: Mastoid air cells are clear. IMPRESSION: Similar appearance of probably subacute infarct in the left parietooccipital lobes. No evidence of hemorrhagic conversion. Electronically Signed   By: Emily Filbert M.D.   On: 04/27/2023 11:00   CT ABDOMEN PELVIS WO CONTRAST Result Date: 04/26/2023 CLINICAL DATA:  Abdominal pain, weight loss, acute kidney injury EXAM: CT ABDOMEN AND PELVIS WITHOUT CONTRAST TECHNIQUE: Multidetector CT imaging of the abdomen and pelvis was performed following the standard protocol without IV contrast. RADIATION DOSE REDUCTION: This exam was performed according to the departmental dose-optimization program which includes automated exposure control, adjustment of the mA and/or kV according to patient size and/or use of iterative reconstruction technique. COMPARISON:  None Available. FINDINGS: Lower chest: No acute abnormality.  ICD wires in the right heart. Hepatobiliary: No focal liver abnormality is seen. Status post cholecystectomy. No biliary dilatation. Pancreas: No focal abnormality or ductal dilatation. Spleen: No focal abnormality.  Normal size. Adrenals/Urinary Tract: Renovascular calcifications within the hila and kidneys bilaterally. No visible urinary tract stones. No hydronephrosis. No renal or adrenal mass. Urinary bladder unremarkable. Stomach/Bowel: Stomach, large and small bowel grossly unremarkable. Vascular/Lymphatic: Diffuse vascular calcifications. No aneurysm or adenopathy. Reproductive: Prior hysterectomy.  No adnexal masses. Other: No free fluid or free air. Musculoskeletal: No acute bony abnormality IMPRESSION: No acute findings in the abdomen or pelvis. Diffuse aortic/arterial atherosclerosis. Electronically Signed   By: Charlett Nose M.D.   On: 04/26/2023 21:29   DG Lumbar Spine  Complete Result Date: 04/26/2023 CLINICAL DATA:  Fall.  Low back pain. EXAM: LUMBAR SPINE - COMPLETE 4+ VIEW; SACRUM AND COCCYX - 2+ VIEW COMPARISON:  None Available. FINDINGS: There are 5 nonrib-bearing lumbar vertebrae. There is mild loss of lumbar lordosis, which may be on the basis of positioning or due to muscle spasm. No spondylolysis. There is grade 1 anterolisthesis of L3 over L4, most likely degenerative. Vertebral body heights are maintained. No aggressive osseous lesion. Mild multilevel degenerative changes in the form of facet arthropathy and marginal osteophyte formation. Visualized soft tissues are within normal limits. Pelvis is intact with normal and symmetric sacroiliac joints. No acute fracture or dislocation. No aggressive osseous lesion. Visualized sacral arcuate lines are unremarkable. Unremarkable symphysis pubis. There are mild degenerative changes of bilateral hip joints without significant joint space narrowing. Osteophytosis of the superior acetabulum. No radiopaque foreign bodies. IMPRESSION: 1. No acute osseous abnormality of the lumbar spine or sacrum/coccyx. 2. Mild multilevel degenerative changes of the lumbar spine. 3. Mild degenerative changes of bilateral hip joints. Electronically Signed   By: Jules Schick M.D.   On: 04/26/2023 15:25   DG Sacrum/Coccyx Result Date: 04/26/2023 CLINICAL DATA:  Fall.  Low back pain. EXAM: LUMBAR SPINE - COMPLETE 4+ VIEW; SACRUM AND COCCYX - 2+ VIEW COMPARISON:  None Available. FINDINGS: There are 5 nonrib-bearing lumbar vertebrae. There is mild loss of lumbar lordosis, which may be on the basis of positioning or due to muscle spasm. No spondylolysis. There is grade 1 anterolisthesis of L3 over L4, most likely degenerative. Vertebral body heights are maintained. No aggressive osseous lesion. Mild multilevel degenerative changes in the form of facet arthropathy and marginal osteophyte formation. Visualized soft tissues are within normal limits.  Pelvis is intact with normal and symmetric sacroiliac joints. No acute fracture or dislocation. No aggressive osseous lesion. Visualized sacral arcuate lines are unremarkable. Unremarkable symphysis pubis. There are mild degenerative  changes of bilateral hip joints without significant joint space narrowing. Osteophytosis of the superior acetabulum. No radiopaque foreign bodies. IMPRESSION: 1. No acute osseous abnormality of the lumbar spine or sacrum/coccyx. 2. Mild multilevel degenerative changes of the lumbar spine. 3. Mild degenerative changes of bilateral hip joints. Electronically Signed   By: Jules Schick M.D.   On: 04/26/2023 15:25   CT Head Wo Contrast Result Date: 04/26/2023 CLINICAL DATA:  Polytrauma, blunt.  Dizziness and weakness. EXAM: CT HEAD WITHOUT CONTRAST TECHNIQUE: Contiguous axial images were obtained from the base of the skull through the vertex without intravenous contrast. RADIATION DOSE REDUCTION: This exam was performed according to the departmental dose-optimization program which includes automated exposure control, adjustment of the mA and/or kV according to patient size and/or use of iterative reconstruction technique. COMPARISON:  None Available. FINDINGS: Brain: There is a 3 x 2 cm infarct along the left parieto-occipital sulcus which is of indeterminate age but may be subacute. Small age indeterminate but possibly recent infarcts are also questioned in the right cerebellar hemisphere. There is also a small age indeterminate infarct laterally in the left cerebellar hemisphere. No acute intracranial hemorrhage, mass, midline shift, or extra-axial fluid collection is identified. Cerebral volume is within normal limits for age. The ventricles are normal in size. Cerebral white matter hypo stops that hypodensities elsewhere in the cerebral white matter bilaterally are nonspecific but compatible with mild chronic small vessel ischemic disease. Vascular: Calcified atherosclerosis at the  skull base. No hyperdense vessel. Skull: No acute fracture or suspicious lesion. Sinuses/Orbits: Small volume secretions in the left maxillary sinus. Clear mastoid air cells. Bilateral cataract extraction. Other: None. IMPRESSION: 1. Small to moderate-sized, potentially subacute left parieto-occipital infarct. 2. Small age indeterminate cerebellar infarcts. 3. Mild chronic small vessel ischemic disease. Electronically Signed   By: Sebastian Ache M.D.   On: 04/26/2023 14:30   DG Chest Portable 1 View Result Date: 04/26/2023 CLINICAL DATA:  Shortness of breath. EXAM: PORTABLE CHEST 1 VIEW COMPARISON:  August 31, 2022. FINDINGS: Stable cardiomediastinal silhouette. Left-sided defibrillator is again noted. Lungs are clear. Bony thorax is unremarkable. IMPRESSION: No active disease. Electronically Signed   By: Lupita Raider M.D.   On: 04/26/2023 13:15    DISCHARGE EXAMINATION: See progress note from earlier today  DISPOSITION: Home  Discharge Instructions     (HEART FAILURE PATIENTS) Call MD:  Anytime you have any of the following symptoms: 1) 3 pound weight gain in 24 hours or 5 pounds in 1 week 2) shortness of breath, with or without a dry hacking cough 3) swelling in the hands, feet or stomach 4) if you have to sleep on extra pillows at night in order to breathe.   Complete by: As directed    Call MD for:  difficulty breathing, headache or visual disturbances   Complete by: As directed    Call MD for:  extreme fatigue   Complete by: As directed    Call MD for:  persistant dizziness or light-headedness   Complete by: As directed    Call MD for:  persistant nausea and vomiting   Complete by: As directed    Call MD for:  severe uncontrolled pain   Complete by: As directed    Call MD for:  temperature >100.4   Complete by: As directed    Diet - low sodium heart healthy   Complete by: As directed    Discharge instructions   Complete by: As directed    Take your medications  as prescribed.   Follow-up with your outpatient providers.  You were cared for by a hospitalist during your hospital stay. If you have any questions about your discharge medications or the care you received while you were in the hospital after you are discharged, you can call the unit and asked to speak with the hospitalist on call if the hospitalist that took care of you is not available. Once you are discharged, your primary care physician will handle any further medical issues. Please note that NO REFILLS for any discharge medications will be authorized once you are discharged, as it is imperative that you return to your primary care physician (or establish a relationship with a primary care physician if you do not have one) for your aftercare needs so that they can reassess your need for medications and monitor your lab values. If you do not have a primary care physician, you can call [phone] for a physician referral.   Increase activity slowly   Complete by: As directed          Allergies as of 04/29/2023       Reactions   Amiodarone Rash   Other Palpitations   PT STATES SHE GETS REALLY HOT , DIZZY HEADED, HAS PASSED OUT TWICE , AND ITCHING WITH IV DYE   Codeine Other (See Comments)   Euphoria, hallucinations   Turmeric    Pt went into AFIB    Colchicine Rash   Iodine Rash   topical   Ivp Dye [iodinated Contrast Media] Rash   Mexitil [mexiletine] Rash        Medication List     STOP taking these medications    eplerenone 25 MG tablet Commonly known as: INSPRA   Jardiance 10 MG Tabs tablet Generic drug: empagliflozin   losartan 25 MG tablet Commonly known as: COZAAR   metolazone 2.5 MG tablet Commonly known as: ZAROXOLYN       TAKE these medications    acetaminophen 500 MG tablet Commonly known as: TYLENOL Take 500 mg by mouth at bedtime as needed for mild pain.   allopurinol 100 MG tablet Commonly known as: ZYLOPRIM take 2 tablets in the morning and 1 tablet in the  evening.   Biotin 1000 MCG tablet Take 1,000 mcg by mouth daily.   CALCIUM + VITAMIN D3 PO Take 1 tablet by mouth daily.   Fish Oil 1200 MG Cpdr Take 1,200 mg by mouth in the morning.   Global Ease Inject Pen Needles 31G X 5 MM Misc Generic drug: Insulin Pen Needle use up to 4 times a day as directed.   insulin lispro 100 UNIT/ML injection Commonly known as: HUMALOG Inject 4-8 Units into the skin daily as needed for high blood sugar. Sliding scale Less than 150 =0 150-200=4 Units 201-250=5 Units 251-300=6 Units 301-350=7 Units 351-400=8 Units   INSULIN SYRINGE .5CC/29G 29G X 1/2" 0.5 ML Misc Use to give yourself daily basal insulin (Lantus or Tuojeo).   levothyroxine 88 MCG tablet Commonly known as: SYNTHROID Take 1 tablet (88 mcg total) by mouth daily before breakfast.   metoprolol succinate 100 MG 24 hr tablet Commonly known as: TOPROL-XL Take 1 tablet (100 mg total) by mouth at bedtime. Take with or immediately following a meal. What changed: when to take this   nitroGLYCERIN 0.4 MG SL tablet Commonly known as: NITROSTAT Place 1 tablet (0.4 mg total) under the tongue every 5 (five) minutes as needed for chest pain.   omeprazole 40 MG capsule Commonly  known as: PRILOSEC Take 1 capsule (40 mg total) by mouth at bedtime. What changed: when to take this   OSTEO BI-FLEX REGULAR STRENGTH PO Take 1 tablet by mouth in the morning.   potassium chloride SA 20 MEQ tablet Commonly known as: Klor-Con M20 Take 1 tablet (20 mEq total) by mouth once a week. Take 1 tablet (20 mEq total) with Metolazone dose.   prochlorperazine 10 MG tablet Commonly known as: COMPAZINE Take 1 tablet (10 mg total) by mouth every 6 (six) hours as needed for nausea or vomiting.   simvastatin 20 MG tablet Commonly known as: ZOCOR Take 1 tablet (20 mg total) by mouth daily at 6 PM.   torsemide 20 MG tablet Commonly known as: DEMADEX Monitor daily weights and take 2 tablets (40 mg) of  torsemide if gains more than 3lbs in a day or 5lbs in a week (discontiune furosemide(lasix)) What changed:  how much to take how to take this when to take this additional instructions   Toujeo Max SoloStar 300 UNIT/ML Solostar Pen Generic drug: insulin glargine (2 Unit Dial) Inject 24 Units into the skin at bedtime.   warfarin 2 MG tablet Commonly known as: COUMADIN Take as directed. If you are unsure how to take this medication, talk to your nurse or doctor. Original instructions: take 1 tablet by mouth daily except 2 tablets on tuesday, thursday, & saturday or as directed by anticoagulation clinic.          Follow-up Information     Glendale Chard, DO Follow up.   Specialty: Family Medicine Contact information: [address] [place] [address] [phone]                 TOTAL DISCHARGE TIME: 35 minutes  Zamiya Dillard Rito Ehrlich  [facility] Pager on [email or web address]  04/30/2023, 10:57 AM

## 2023-05-01 ENCOUNTER — Telehealth: Payer: Self-pay

## 2023-05-01 ENCOUNTER — Ambulatory Visit: Payer: PPO | Attending: Cardiology | Admitting: *Deleted

## 2023-05-01 DIAGNOSIS — I513 Intracardiac thrombosis, not elsewhere classified: Secondary | ICD-10-CM

## 2023-05-01 DIAGNOSIS — Z5181 Encounter for therapeutic drug level monitoring: Secondary | ICD-10-CM | POA: Diagnosis not present

## 2023-05-01 DIAGNOSIS — I4891 Unspecified atrial fibrillation: Secondary | ICD-10-CM

## 2023-05-01 LAB — POCT INR: INR: 3.2 — AB (ref 2.0–3.0)

## 2023-05-01 NOTE — Transitions of Care (Post Inpatient/ED Visit) (Signed)
   05/01/2023  Name: MILANNI AYUB MRN: [id number] DOB: 24-Sep-1950  Today's TOC FU Call Status: Today's TOC FU Call Status:: Unsuccessful Call (1st Attempt) Unsuccessful Call (1st Attempt) Date: 05/01/23  Attempted to reach the patient regarding the most recent Inpatient/ED visit.  Follow Up Plan: Additional outreach attempts will be made to reach the patient to complete the Transitions of Care (Post Inpatient/ED visit) call.   Hilbert Odor RN, CCM [place]  [facility]Population [facility] RN Care Manager [phone]

## 2023-05-01 NOTE — Patient Instructions (Signed)
 Hold warfarin tonight then decrease dose to 1 tablet daily except 2 tablets on Saturdays Recheck in 2 weeks

## 2023-05-02 ENCOUNTER — Telehealth: Payer: Self-pay | Admitting: *Deleted

## 2023-05-02 NOTE — Transitions of Care (Post Inpatient/ED Visit) (Signed)
 05/02/2023  Name: Emily [name] MRN: [id number] DOB: 01/04/1951  Today's TOC FU Call Status: Today's TOC FU Call Status:: Successful TOC FU Call Completed TOC FU Call Complete Date: 05/02/23 Patient's Name and Date of Birth confirmed.  Transition Care Management Follow-up Telephone Call Date of Discharge: 05/10/23 Discharge Facility: [facility]) Type of Discharge: Inpatient Admission Primary Inpatient Discharge Diagnosis:: acute kidney injury How have you been since you were released from the hospital?: Better Any questions or concerns?: No  Items Reviewed: Did you receive and understand the discharge instructions provided?: Yes Medications obtained,verified, and reconciled?: Yes (Medications Reviewed) Any new allergies since your discharge?: No Dietary orders reviewed?: No Do you have support at home?: Yes People in Home: spouse, child(ren), adult Name of Support/Comfort Primary Source: Emily [name]  Medications Reviewed Today: Medications Reviewed Today     Reviewed by Luella Cook, RN (Case Manager) on 05/02/23 at 1559  Med List Status: <None>   Medication Order Taking? Sig Documenting Provider Last Dose Status Informant  acetaminophen (TYLENOL) 500 MG tablet [id number] Yes Take 500 mg by mouth at bedtime as needed for mild pain. [provider] Taking Active Self  allopurinol (ZYLOPRIM) 100 MG tablet [id number] Yes take 2 tablets in the morning and 1 tablet in the evening. Valetta Close, MD Taking Active Self  Biotin 1000 MCG tablet [id number] Yes Take 1,000 mcg by mouth daily. [provider] Taking Active Self  Calcium Carb-Cholecalciferol (CALCIUM + VITAMIN D3 PO) [id number] Yes Take 1 tablet by mouth daily. [provider] Taking Active Self  Glucosamine-Chondroitin (OSTEO BI-FLEX REGULAR STRENGTH PO) [id number] Yes Take 1 tablet by mouth in the morning. [provider] Taking Active Self  insulin glargine, 2 Unit Dial, (TOUJEO MAX  SOLOSTAR) 300 UNIT/ML Solostar Pen [id number] Yes Inject 24 Units into the skin at bedtime. Valetta Close, MD Taking Active Self  insulin lispro (HUMALOG) 100 UNIT/ML injection [id number] Yes Inject 4-8 Units into the skin daily as needed for high blood sugar. Sliding scale Less than 150 =0 150-200=4 Units 201-250=5 Units 251-300=6 Units 301-350=7 Units 351-400=8 Units [provider] Taking Active Self  Insulin Pen Needle (GLOBAL EASE INJECT PEN NEEDLES) 31G X 5 MM MISC [id number] Yes use up to 4 times a day as directed. Glendale Chard, DO Taking Active Self  INSULIN SYRINGE .5CC/29G 29G X 1/2" 0.5 ML MISC [id number] Yes Use to give yourself daily basal insulin (Lantus or Tuojeo). Valetta Close, MD Taking Active Self  levothyroxine (SYNTHROID) 88 MCG tablet [id number] Yes Take 1 tablet (88 mcg total) by mouth daily before breakfast. Valetta Close, MD Taking Active Self  metoprolol succinate (TOPROL-XL) 100 MG 24 hr tablet [id number] Yes Take 1 tablet (100 mg total) by mouth at bedtime. Take with or immediately following a meal.  Patient taking differently: Take 100 mg by mouth daily. Take with or immediately following a meal.   Lee, [place], NP Taking Active Self  nitroGLYCERIN (NITROSTAT) 0.4 MG SL tablet [id number] Yes Place 1 tablet (0.4 mg total) under the tongue every 5 (five) minutes as needed for chest pain. Merrill, Vance, FNP Taking Active Self  Omega-3 Fatty Acids (FISH OIL) 1200 MG CPDR [id number] Yes Take 1,200 mg by mouth in the morning. [provider] Taking Active Self  omeprazole (PRILOSEC) 40 MG capsule [id number] Yes Take 1 capsule (40 mg total) by mouth at bedtime.  Patient taking differently: Take 40 mg by mouth daily.   Lee, [place], NP Taking Active  Self  potassium chloride SA (KLOR-CON M20) 20 MEQ tablet [id number] Yes Take 1 tablet (20 mEq total) by mouth once a week. Take 1 tablet (20 mEq total) with Metolazone dose. Laurey Morale, MD Taking Active  Self           Med Note Waldemar Dickens Apr 26, 2023  5:53 PM) On hold until 04/28/23 per MD.  prochlorperazine (COMPAZINE) 10 MG tablet [id number] Yes Take 1 tablet (10 mg total) by mouth every 6 (six) hours as needed for nausea or vomiting. Osvaldo Shipper, MD Taking Active   simvastatin (ZOCOR) 20 MG tablet [id number] Yes Take 1 tablet (20 mg total) by mouth daily at 6 PM. Laurey Morale, MD Taking Active Self  torsemide (DEMADEX) 20 MG tablet [id number] Yes Monitor daily weights and take 2 tablets (40 mg) of torsemide if gains more than 3lbs in a day or 5lbs in a week (discontiune furosemide(lasix)) Osvaldo Shipper, MD Taking Active   warfarin (COUMADIN) 2 MG tablet [id number] Yes take 1 tablet by mouth daily except 2 tablets on tuesday, thursday, & saturday or as directed by anticoagulation clinic. Laurey Morale, MD Taking Active Self            Home Care and Equipment/Supplies: Were Home Health Services Ordered?: NA Any new equipment or medical supplies ordered?: NA  Functional Questionnaire: Do you need assistance with bathing/showering or dressing?: Yes Do you need assistance with meal preparation?: Yes Do you need assistance with eating?: No Do you have difficulty maintaining continence: No Do you need assistance with getting out of bed/getting out of a chair/moving?: No Do you have difficulty managing or taking your medications?: No  Follow up appointments reviewed: PCP Follow-up appointment confirmed?: No MD Provider Line [phone] Given: Yes (Daughter will call and make appt) Specialist Hospital Follow-up appointment confirmed?: Yes Date of Specialist follow-up appointment?: 05/08/23 Follow-Up Specialty Provider:: Kidney specialist/ Patient is rescheduling Defib check due to conflict with kidney specialist. [address] [facility]/ 0[address] Dr Freida Busman Do you need transportation to your follow-up appointment?: No Do you understand care options  if your condition(s) worsen?: Yes-patient verbalized understanding  SDOH Interventions Today    Flowsheet Row Most Recent Value  SDOH Interventions   Food Insecurity Interventions Intervention Not Indicated  Housing Interventions Intervention Not Indicated  Transportation Interventions Intervention Not Indicated, Patient Resources (Friends/Family)  [Per patient her daughter will take her]  Utilities Interventions Intervention Not Indicated      Patient declined [facility] program  Gean Maidens BSN RN [employer] Population Health Care Management Coordinator [email or web address] Direct Dial: [phone]  Fax: [phone] Website: [email or web address]

## 2023-05-04 ENCOUNTER — Other Ambulatory Visit: Payer: Self-pay | Admitting: Cardiology

## 2023-05-04 DIAGNOSIS — Z5181 Encounter for therapeutic drug level monitoring: Secondary | ICD-10-CM

## 2023-05-04 DIAGNOSIS — I4821 Permanent atrial fibrillation: Secondary | ICD-10-CM

## 2023-05-08 ENCOUNTER — Encounter: Payer: PPO | Admitting: Internal Medicine

## 2023-05-08 DIAGNOSIS — N184 Chronic kidney disease, stage 4 (severe): Secondary | ICD-10-CM | POA: Diagnosis not present

## 2023-05-08 DIAGNOSIS — I129 Hypertensive chronic kidney disease with stage 1 through stage 4 chronic kidney disease, or unspecified chronic kidney disease: Secondary | ICD-10-CM | POA: Diagnosis not present

## 2023-05-08 DIAGNOSIS — E1122 Type 2 diabetes mellitus with diabetic chronic kidney disease: Secondary | ICD-10-CM | POA: Diagnosis not present

## 2023-05-08 DIAGNOSIS — I428 Other cardiomyopathies: Secondary | ICD-10-CM | POA: Diagnosis not present

## 2023-05-09 ENCOUNTER — Inpatient Hospital Stay: Admitting: Student

## 2023-05-11 LAB — LAB REPORT - SCANNED: EGFR: 16

## 2023-05-15 ENCOUNTER — Ambulatory Visit (INDEPENDENT_AMBULATORY_CARE_PROVIDER_SITE_OTHER)

## 2023-05-15 ENCOUNTER — Ambulatory Visit (HOSPITAL_COMMUNITY)
Admission: RE | Admit: 2023-05-15 | Discharge: 2023-05-15 | Disposition: A | Discharge status: Home / Self Care | Source: Ambulatory Visit | Attending: Cardiology | Admitting: Cardiology

## 2023-05-15 ENCOUNTER — Encounter (HOSPITAL_COMMUNITY): Payer: Self-pay

## 2023-05-15 VITALS — BP 100/60 | HR 83 | Wt 126.8 lb

## 2023-05-15 DIAGNOSIS — I130 Hypertensive heart and chronic kidney disease with heart failure and stage 1 through stage 4 chronic kidney disease, or unspecified chronic kidney disease: Secondary | ICD-10-CM | POA: Insufficient documentation

## 2023-05-15 DIAGNOSIS — I080 Rheumatic disorders of both mitral and aortic valves: Secondary | ICD-10-CM | POA: Insufficient documentation

## 2023-05-15 DIAGNOSIS — I5022 Chronic systolic (congestive) heart failure: Secondary | ICD-10-CM

## 2023-05-15 DIAGNOSIS — N184 Chronic kidney disease, stage 4 (severe): Secondary | ICD-10-CM | POA: Insufficient documentation

## 2023-05-15 DIAGNOSIS — I272 Pulmonary hypertension, unspecified: Secondary | ICD-10-CM | POA: Diagnosis not present

## 2023-05-15 DIAGNOSIS — I493 Ventricular premature depolarization: Secondary | ICD-10-CM | POA: Insufficient documentation

## 2023-05-15 DIAGNOSIS — Z7901 Long term (current) use of anticoagulants: Secondary | ICD-10-CM | POA: Insufficient documentation

## 2023-05-15 DIAGNOSIS — I472 Ventricular tachycardia, unspecified: Secondary | ICD-10-CM | POA: Diagnosis not present

## 2023-05-15 DIAGNOSIS — I4821 Permanent atrial fibrillation: Secondary | ICD-10-CM | POA: Insufficient documentation

## 2023-05-15 DIAGNOSIS — Z79899 Other long term (current) drug therapy: Secondary | ICD-10-CM | POA: Diagnosis not present

## 2023-05-15 DIAGNOSIS — I428 Other cardiomyopathies: Secondary | ICD-10-CM | POA: Insufficient documentation

## 2023-05-15 LAB — CUP PACEART INCLINIC DEVICE CHECK
Date Time Interrogation Session: 20250317094307
Implantable Lead Connection Status: 753985
Implantable Lead Connection Status: 753985
Implantable Lead Connection Status: 753985
Implantable Lead Implant Date: 20050218
Implantable Lead Implant Date: 20050218
Implantable Lead Implant Date: 20050218
Implantable Lead Location: 753858
Implantable Lead Location: 753859
Implantable Lead Location: 753860
Implantable Lead Model: 4194
Implantable Lead Model: 5076
Implantable Lead Model: 6947
Implantable Pulse Generator Implant Date: 20230313

## 2023-05-15 MED ORDER — EMPAGLIFLOZIN 10 MG PO TABS: 10.0000 mg | ORAL_TABLET | Freq: Every day | ORAL | 6 refills | Status: DC

## 2023-05-15 NOTE — Progress Notes (Signed)
 Advanced Heart Failure Clinic Note   Referring Physician: PCP: Glendale Chard, DO PCP-Cardiologist: Marca Ancona, MD   Chief Complaint: [facility] F/u; Medical Management of chronic systolic heart failure following recent admit for pre-renal AKI.    HPI:  73 y.o. with history of CKD stage IV, permanent atrial fibrillation, and a long-standing nonischemic cardiomyopathy was referred by Dr. Diona Browner for CHF clinic evaluation.  She first developed cardiac problems in 1988.  At that time, she was admitted with CHF and eventually underwent myocardial biopsy at [facility], per her report showing myocarditis.  Patient states that her EF has been low since then.  She had MDT CRT-D device implanted.  Echo in 7/21 showed EF 20-25%, moderate MR, mild AS. She is now in permanent atrial fibrillation and is s/p AV nodal ablation with BiV pacing. Elevated creatinine has limited medication management.     Echo in 10/22 showed EF 25-30%, severe LV dilation, RV normal, moderate TR, mod-severe AS with mean gradient 9 mmHg but AVA 0.76 cm^2 (?low gradient severe AS), mild-moderate MR, left=>right shunt possible ASD.  TEE done in 10/22 to more closely assess AS.  This showed EF 30%, moderate LV dilation, normal RV, possible calcified vegetation on the ICD lead, small secundum ASD, peak RV-RA gradient 23 mmHg, low flow/low gradient moderate AS, mild-moderate MR. RHC/LHC was done, showing mild nonobstructive CAD, left to right atrial level shunt with Qp/Qs 1.43.  I reviewed TEE and cath report with Dr. Excell Seltzer.  We decided that AS was not severe enough yet for TAVR and that the ASD was not significant enough to close percutaneously. With possible calcified vegetation on ICD, we drew blood cultures which were negative.  She saw ID and had a PET-CT, which did not suggest active endocarditis.    Patient was found on Zio patch in 10/22 to have 12.6% PVCs, this was thought to be limiting her BiV pacing.  She has a history of  intolerance of amiodarone so was started on mexiletine.  She says that this caused a rash so she stopped it and rash resolved. She also decreased Toprol XL to 100 mg daily due to nightmares when taking 150 mg daily.  She subsequently has decreased Toprol XL to 50 mg daily.    Echo in 10/23 showed EF 25-30%, severe LV dilation, normal RV, mild MR, moderate AS with AVA 1.1 cm^2, mean gradient 9 mmHg (low flow low gradient moderate AS).    Follow up 2/24, volume stable but worse NYHA III symptoms with recent syncope concerning for orthostasis.  Zio 7-day (2/24): 7 runs of VT, continuous atrial fibrillation   RHC (2/24) showed elevated PCWP with prominent V-waves suggestive of significant mitral regurgitation, mildly elevated RA pressure, preserved CO, moderate pulmonary venous hypertension, small ASD. Echo showed EF 30-35%, likely low flow/low gradient aortic stenosis, but cannot fully rule out severe AS.    Follow up with Dr. Ladona Ridgel 05/10/22, with increased PVC burden, her pacing rate was increased from 60-80 and PVC improved.   TEE 4/24 with LVEF 30-35%, LV w/ RWA, RV mildly reduced, "smoke" noted in LAA, RA mildly dilated, small secundum ASD 0.45 cm in diameter, TR mild-mod. Mild-mod aortic valve stenosis; AV mean gradient with AVA 1.93 cm^2; moderate-severe functional MR.    Echo in 6/24 with EF 30-35%, moderately decreased RV systolic function, moderate functional MR.    PASCAL mTEER was attempted in 7/24.  Unfortunately, the procedure was unsuccessful.  Case was aborted due to chordal interaction worsening mitral  regurgitation (V-waves at baseline ~25mmHG with device grasps up to 50-33mmHg). Medical therapy was pursued.    Weekly metolazone 2.5 mg + 20 KCL added to diuretic regimen 01/09/23 due to ongoing symptoms and OptiVol elevation.   Zio patch placed for palpitations (12/24) showing predominantly Afib with PVC burden of 5.5% and short runs of NSVT.   She was seen recently by her  nephrologist 2/24 and due to worsening renal function was sent to the emergency room yesterday.  She had been having nausea, vomiting and decreased p.o. intake for 2 weeks with multiple falls.  On arrival in ER hemoglobin 11, sodium 116, potassium 3.4, glucose 389, creatinine 4.95 (baseline 2.8-3.2), anion gap 24.  Head CT showed a small to moderate subacute left parieto-occipital infarct.  Nephrology felt AKI was prerenal in the setting of profound hyperglycemia and low p.o. intake take.  She was started on IV fluids. Diuretics and HF GDMT were held given AKI. Repeat 2D echo actually showed improvement in EF to 35 to 40% with global HK, normal RV function, mild to moderate MR and mild to moderate low-flow low gradient AS.  She was hydrated w/ IVFs w/ improvement in SCr, returning back to b/l of 2.18. Na improved from 116>>127. At discharge, was instructed to restart prevous home dose of Toprol XL at 100 mg daily + torsemide PRN only. Instructed to hold previous home meds at discharge (Jardiance 10 mg daily, eplerenone 25 mg daily, losartan 12.5 mg daily, metolazone 2.5 mg once per week.  She was seen by nephrology last wk for post hospital. Labs were obtained and sent to our office for review, SCr was back up to 3.01. K 4.1.   She presents back today for post hospital f/u for HF. Here w/ her daughter. In WC. Now under hospice care. She is requesting that ICD therapies be turned off. Wants to continue pacing therapies.   Volume trending up on device interrogation. Fluid index has crossed threshold and remains persistently elevated. She reports wt gain since d/c and has been taking torsemide daily. Notes UOP is "ok/normal". Also reports recent increase in exertional dyspnea, mild LEE and abdominal fullness/distention. No current resting dyspnea.   ROS: per HPI above   Past Medical History:  Diagnosis Date   Acute pain of right shoulder 03/22/2019   Acute renal failure (HCC) 04/15/2012   Last  Assessment & Plan:  Superimposed on CK D stage III, Baseline Cr ~2-2.5, remains above baseline, felt to be due to Cardiorenal syndrome, creatinine trending down at time of discharge Negative urine eos and no urinary retention ACE inhibitor discontinued Follows with Dr Julien Girt as outpatient, to have labs drawn 08/15/16 and results sent to [facility] office for review   AICD (automatic cardioverter/defibrillator) present    Anemia 12/29/2016   Aortic stenosis    Arthritis    hands   ASD (atrial septal defect)    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   Asthma    as a child   Atrial fibrillation with RVR (HCC)    Biventricular ICD (implantable cardioverter-defibrillator) in place    CHF (congestive heart failure) (HCC)    CKD (chronic kidney disease), stage IV (HCC)    Colitis 09/11/2012   Complete heart block (HCC)    Dyspnea    Essential hypertension    GERD (gastroesophageal reflux disease)    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 Physical exam revealed cerumen impaction of left ear canal. Relieved by flushing.    Hypomagnesemia 04/10/2016   Hyponatremia 04/10/2016   Last Assessment & Plan:  - improved after tolvaptan 6/29, chiefly driven by CHF, further improvement with ongoing IV Lasix - continue low Na and fluid restriction - compliance with sodium restriction a major barrier to keeping patient out of hospital and compensated, remains resistant to this philosophy while inpatient - follows with Dr Julien Girt as outpatient, Neph consult not indicated at this ti   Hypothyroidism    ICD (implantable cardioverter-defibrillator) battery depletion 04/21/2014   Localized macular rash 08/11/2016   Last Assessment & Plan:  Seems to trend with heart failure, though she has  more findings behind bilateral knees and up into her thigh/groin area than when I last saw her Would use steroid cream for now, consider alternative diagnosis if no improvement   Long-term insulin use in type 2 diabetes (HCC) 04/10/2016   Last Assessment & Plan:  Hypoglycemia resolved, continue SSI and hypoglycemia protocol   Mitral regurgitation    Nonischemic cardiomyopathy (HCC)    On amiodarone therapy 08/21/2017   Persistent atrial fibrillation (HCC)    AV node ablation 2019   Pneumonia    PONV (postoperative nausea and vomiting)    has awakened during surgery   Restless leg syndrome 03/22/2019   Type 2 diabetes mellitus (HCC)     Current Outpatient Medications  Medication Sig Dispense Refill   acetaminophen (TYLENOL) 500 MG tablet Take 500 mg by mouth at bedtime as needed for mild pain.     allopurinol (ZYLOPRIM) 100 MG tablet take 2 tablets in the morning and 1 tablet in the evening. 100 tablet 11   Biotin 1000 MCG tablet Take 1,000 mcg by mouth daily.     Calcium Carb-Cholecalciferol (CALCIUM + VITAMIN D3 PO) Take 1 tablet by mouth daily.     Glucosamine-Chondroitin (OSTEO BI-FLEX REGULAR STRENGTH PO) Take 1 tablet by mouth in the morning.     insulin glargine, 2 Unit Dial, (TOUJEO MAX SOLOSTAR) 300 UNIT/ML Solostar Pen Inject 24 Units into the skin at bedtime. 9 mL 2   insulin lispro (HUMALOG) 100 UNIT/ML injection Inject 4-8 Units into the skin daily as needed for high blood sugar. Sliding scale Less than 150 =0 150-200=4 Units 201-250=5 Units 251-300=6 Units 301-350=7 Units 351-400=8 Units     Insulin Pen Needle (GLOBAL EASE INJECT PEN NEEDLES) 31G X 5 MM MISC use up to 4 times a day as directed. 90 each 3   INSULIN SYRINGE .5CC/29G 29G X 1/2" 0.5 ML MISC Use to give yourself daily basal insulin (Lantus or Tuojeo). 100 each 6   levothyroxine (SYNTHROID) 88 MCG tablet Take 1 tablet (88 mcg total) by mouth daily before breakfast. 90 tablet 3   metoprolol succinate  (TOPROL-XL) 100 MG 24 hr tablet Take 1 tablet (100 mg total) by mouth at bedtime. Take with or immediately following a meal. (Patient taking differently: Take 100 mg by mouth daily. Take with or immediately following a meal.) 30 tablet 6   nitroGLYCERIN (NITROSTAT) 0.4 MG SL tablet Place 1 tablet (0.4 mg total) under the tongue every 5 (five) minutes as needed for chest pain. 90 tablet 3   Omega-3 Fatty Acids (FISH OIL) 1200 MG CPDR Take 1,200 mg by mouth in the morning.  omeprazole (PRILOSEC) 40 MG capsule Take 1 capsule (40 mg total) by mouth at bedtime. (Patient taking differently: Take 40 mg by mouth daily.) 90 capsule 0   potassium chloride SA (KLOR-CON M20) 20 MEQ tablet Take 1 tablet (20 mEq total) by mouth once a week. Take 1 tablet (20 mEq total) with Metolazone dose. 15 tablet 3   prochlorperazine (COMPAZINE) 10 MG tablet Take 1 tablet (10 mg total) by mouth every 6 (six) hours as needed for nausea or vomiting. 30 tablet 0   simvastatin (ZOCOR) 20 MG tablet Take 1 tablet (20 mg total) by mouth daily at 6 PM. 90 tablet 3   torsemide (DEMADEX) 20 MG tablet Monitor daily weights and take 2 tablets (40 mg) of torsemide if gains more than 3lbs in a day or 5lbs in a week (discontiune furosemide(lasix)) 60 tablet 0   warfarin (COUMADIN) 2 MG tablet take 1 tablet by mouth daily except 2 tablets on tuesday, thursday, & saturday or as directed by anticoagulation clinic. 40 tablet 0   No current facility-administered medications for this visit.    Allergies  Allergen Reactions   Amiodarone Rash   Other Palpitations    PT STATES SHE GETS REALLY HOT , DIZZY HEADED, HAS PASSED OUT TWICE , AND ITCHING WITH IV DYE   Codeine Other (See Comments)    Euphoria, hallucinations   Turmeric     Pt went into AFIB    Colchicine Rash   Iodine Rash    topical   Ivp Dye [Iodinated Contrast Media] Rash   Mexitil [Mexiletine] Rash      Social History   Socioeconomic History   Marital status: Married     Spouse name: Not on file   Number of children: Not on file   Years of education: Not on file   Highest education level: Not on file  Occupational History   Not on file  Tobacco Use   Smoking status: Never    Passive exposure: Never   Smokeless tobacco: Never  Vaping Use   Vaping status: Never Used  Substance and Sexual Activity   Alcohol use: No   Drug use: No   Sexual activity: Not on file  Other Topics Concern   Not on file  Social History Narrative   Not on file   Social Drivers of Health   Financial Resource Strain: Medium Risk (03/25/2023)   Received from [facility]   Overall Financial Resource Strain (CARDIA)    Difficulty of Paying Living Expenses: Somewhat hard  Food Insecurity: No Food Insecurity (05/02/2023)   Hunger Vital Sign    Worried About Running Out of Food in the Last Year: Never true    Ran Out of Food in the Last Year: Never true  Transportation Needs: No Transportation Needs (05/02/2023)   PRAPARE - Administrator, Civil Service (Medical): No    Lack of Transportation (Non-Medical): No  Recent Concern: Transportation Needs - Unmet Transportation Needs (03/25/2023)   [facility] from [facility]   [facility] - Transportation    Lack of Transportation (Medical): Yes    Lack of Transportation (Non-Medical): No  Physical Activity: Insufficiently Active (09/16/2022)   Exercise Vital Sign    Days of Exercise per Week: 3 days    Minutes of Exercise per Session: 10 min  Stress: No Stress Concern Present (09/16/2022)   [employer] of Occupational Health - Occupational Stress Questionnaire    Feeling of Stress : Only a little  Social  Connections: Moderately Integrated (04/26/2023)   Social Connection and Isolation Panel [NHANES]    Frequency of Communication with Friends and Family: More than three times a week    Frequency of Social Gatherings with Friends and Family: More than three times a week    Attends Religious Services: 1 to 4 times  per year    Active Member of [employer] or Organizations: No    Attends Banker Meetings: Never    Marital Status: Married  Catering manager Violence: Not At Risk (05/02/2023)   Humiliation, Afraid, Rape, and Kick questionnaire    Fear of Current or Ex-Partner: No    Emotionally Abused: No    Physically Abused: No    Sexually Abused: No      Family History  Problem Relation Age of Onset   Hip fracture Mother    COPD Father    CAD Sister    Heart attack Sister     There were no vitals filed for this visit.   PHYSICAL EXAM: General: elderly frail/weak appearing F, in WC. No respiratory difficulty HEENT: normal Neck: supple. JVD 9 cm. Carotids 2+ bilat; no bruits. No lymphadenopathy or thyromegaly appreciated. Cor: PMI nondisplaced. Regular rate & rhythm. 3/6 MR murmur  Lungs: decreased BS at the bases bilaterally  Abdomen: soft, nontender, +distended.  Extremities: no cyanosis, clubbing, rash, trace b/l ankle edema Neuro: alert & oriented x 3, cranial nerves grossly intact. moves all 4 extremities w/o difficulty. Affect pleasant.  ECG: not performed    ASSESSMENT & PLAN:  1. Chronic Systolic Heart Failure: Nonischemic cardiomyopathy. Low EF for years. Per patient's report, she had an endomyocardial biopsy at [facility] in 1988 showing myocarditis. Medication titration has been limited by CKD stage IV. Has MDT CRT-D device, s/p AV nodal ablation with chronic AF. Echo in 7/21 showed EF 20-25%, moderate MR, mild AS. Echo 12/17/20 showed EF 25-30%, severe LV dilation, RV normal, moderate TR, mod-severe AS with mean gradient 9 mmHg but AVA 0.76 cm^2. TEE in 10/22 with EF 30%. LHC in 10/22 with mild nonobstructive CAD. Echo in 10/23 showed EF 25-30%, severe LV dilation, normal RV, mild MR, moderate AS with AVA 1.1 cm^2, mean gradient 9 mmHg (low flow low gradient moderate AS), small ASD. RHC (2/24) with mildly elevated RA pressure, moderate pulmonary venous hypertension, preserved CO  and elevated PCWP with prominent v-waves. Echo 2/24 showed EF 30-35%, likely low flow/low gradient moderate aortic stenosis, moderate MR & ASD visualized. Echo in 6/24 with EF 30-35%, moderate RV dysfunction, moderate functional MR. Echo 2/25  30-35%, moderately decreased RV systolic function, moderate functional MR - NYHA Class III-lllb, c/b cardiorenal syndrome. Device interrogation today c/w fluid re accumulation after diuretics scaled back. She is now under hospice care.  - Restart Jardiance 10 mg daily to help w/ volume and symptom management  - Continue Torsemide 80 mg daily  - Continue Toprol XL 100 mg daily  - off Eplerenone given CKD, recent SCr at 3.0  - Per pt request, she wants ICD therapies turned off as she is now under hospice care. She has device clinic f/u arranged today for this   2. CKD (recent AKI, pre-renal) - cardiorenal, b/l SCr 2.8-3.2. Recent spike to 4.9 2/2 pre-renal AKI from hyperglycemia and decreased PO intake>>improved w/ diuretic/GDMT hold and hydration w/ IVFs. Scr improved to 2.2 at discharge and back up to 3.0 at nephrology f/u on 3/10 - restart Jardiance 10 mg daily to help w/ volume management + CKD  3. Mitral Regurgitation  - severe MR, failed mTEER attempt 7/24. Case was aborted due to chordal interaction worsening mitral regurgitation (V-waves at baseline ~64mmHG with device grasps up to 50-59mmHg) - medical management   4. Aortic Stenosis - moderate on last TEE  5. Permanent Afib  - S/p AV nodal ablation with BiV pacing  - on warfarin  Pt now under home hospice care. They will continue w/ symptom management. EP clinic to deactivate ICD therapies today.    Robbie Lis, PA-C 05/15/23

## 2023-05-15 NOTE — Progress Notes (Signed)
 Pt seen in device clinic to discontinue HV therapies. Dr. Graciela Husbands spoke with Pt and provided education. HV therapies discontinued.  Will continue to follow remotely.

## 2023-05-15 NOTE — Patient Instructions (Signed)
 Re-start Jardiance 10 mg daily. Rx sent to local pharmacy. Go to Device Clinic on [address] to have ICD therapy turned off today. Please call us at [phone] if any questions or concerns.

## 2023-05-16 ENCOUNTER — Ambulatory Visit: Admitting: Pulmonary Disease

## 2023-05-17 ENCOUNTER — Ambulatory Visit: Attending: Cardiology | Admitting: *Deleted

## 2023-05-17 DIAGNOSIS — I513 Intracardiac thrombosis, not elsewhere classified: Secondary | ICD-10-CM

## 2023-05-17 DIAGNOSIS — I4891 Unspecified atrial fibrillation: Secondary | ICD-10-CM | POA: Diagnosis not present

## 2023-05-17 DIAGNOSIS — Z5181 Encounter for therapeutic drug level monitoring: Secondary | ICD-10-CM

## 2023-05-17 LAB — POCT INR: INR: 2.3 (ref 2.0–3.0)

## 2023-05-17 NOTE — Patient Instructions (Signed)
 Continue warfarin 1 tablet daily except 2 tablets on Saturdays Hospice has been called in.  Pt states they are talking about stopping warfarin but has not be discontinue yet. Recheck in 4 weeks if still on warfarin

## 2023-05-19 ENCOUNTER — Inpatient Hospital Stay: Admitting: Student

## 2023-05-22 ENCOUNTER — Ambulatory Visit: Payer: PPO | Attending: Internal Medicine

## 2023-05-22 DIAGNOSIS — I5022 Chronic systolic (congestive) heart failure: Secondary | ICD-10-CM

## 2023-05-22 DIAGNOSIS — Z95810 Presence of automatic (implantable) cardiac defibrillator: Secondary | ICD-10-CM | POA: Diagnosis not present

## 2023-05-22 NOTE — Progress Notes (Signed)
 EPIC Encounter for ICM Monitoring  Patient Name: Emily [name] is a 73 y.o. female Date: 05/22/2023 Primary Care Physican: Glendale Chard, DO Primary Cardiologist: [facility] Electrophysiologist: Ladona Ridgel Nephrologist: [facility]-Dr Lacy Duverney  Bi-V Pacing: 98.1%  06/08/2022 Weight: 122-123 lbs 09/20/2022 Weight: 119-121 lbs 10/03/2022 Weight: 120 lbs 12/07/2022 Weight: 123-124 lbs    Transmission results reviewed.           Optivol Thoracic impedance suggesting possible fluid accumulation starting 2/24.  ED visit 2/26.   Prescribed: Furosemide 80 mg Take 1 tablet (80 mg total) by mouth daily     Metolazone 2.5 mg take 1 tablet by mouth weekly.  Take 20 mEq potassium with weekly dose.  Potassium 20 mEq take 1 tablet by mouth with weekly Metolazone dosage   Labs: 03/26/2023 Creatinine 3.8,   BUN 84, Potassium 5.1, Sodium 128, GFR 12 Care Everywhere 03/25/2023 Creatinine 3.34, BUN 82, Potassium 5.5, Sodium 128, GFR 14 Care Everywhere 03/24/2023 Creatinine 3.26, BUN 86, Potassium 2.6, Sodium 139, GFR 14 Care Everywhere 03/13/2023 Creatinine 2.83, BUN 87, Potassium 4.0, Sodium 132, GFR 17 03/02/2023 Creatinine 3.16, BUN 76, Potassium 4.4, Sodium 133  A complete set of results can be found in Results Review.   Recommendations:  Pt seen 3/17 by Dr Shirlee Latch and aware of fluid status.  3/17 HF note says patient is under the care of hospice.  Per device clinic note, HV therapies turned off.   Follow-up plan:  No further ICM clinic follow up appointments due to patient is under hospice care.  91 day device clinic remote transmission 06/13/2023.      EP/Cardiology Office Visits:  05/08/2023 with Dr Ladona Ridgel.  05/25/2023 with Dr Shirlee Latch.       Copy of ICM check sent to Dr. Ladona Ridgel.  3 month ICM trend: 05/22/2023.    12-14 Month ICM trend:     Karie Soda, RN 05/22/2023 3:46 PM

## 2023-05-24 ENCOUNTER — Encounter (HOSPITAL_COMMUNITY)

## 2023-05-25 ENCOUNTER — Encounter (HOSPITAL_COMMUNITY): Payer: PPO | Admitting: Cardiology

## 2023-06-12 ENCOUNTER — Ambulatory Visit (INDEPENDENT_AMBULATORY_CARE_PROVIDER_SITE_OTHER): Payer: PPO

## 2023-06-12 ENCOUNTER — Other Ambulatory Visit (HOSPITAL_COMMUNITY): Payer: Self-pay

## 2023-06-12 ENCOUNTER — Telehealth: Payer: Self-pay

## 2023-06-12 DIAGNOSIS — I5022 Chronic systolic (congestive) heart failure: Secondary | ICD-10-CM

## 2023-06-12 DIAGNOSIS — I420 Dilated cardiomyopathy: Secondary | ICD-10-CM

## 2023-06-12 NOTE — Telephone Encounter (Signed)
 Returned call to patient as requested by voice mail message.  She called to say she is now in hospice. It has been hard for her to believe she is at this stage but has a lot of support from family and friends.   She has appreciated all the support and caring I have provided.  She is grateful to have such good care.  Hospice is providing care and understands her time is limited.   Expressed sympathy and that it has been an Systems developer and pleasure to have known her. Explained I am grateful that she took time to call to express feedback of the care she has received.

## 2023-06-13 ENCOUNTER — Encounter: Payer: Self-pay | Admitting: Internal Medicine

## 2023-06-13 LAB — CUP PACEART REMOTE DEVICE CHECK
Battery Remaining Longevity: 81 mo
Battery Voltage: 2.99 V
Brady Statistic AP VP Percent: 0 %
Brady Statistic AP VS Percent: 0 %
Brady Statistic AS VP Percent: 0 %
Brady Statistic AS VS Percent: 0 %
Brady Statistic RA Percent Paced: 0 %
Brady Statistic RV Percent Paced: 98.63 %
Date Time Interrogation Session: 20250414001603
HighPow Impedance: 58 Ohm
Implantable Lead Connection Status: 753985
Implantable Lead Connection Status: 753985
Implantable Lead Connection Status: 753985
Implantable Lead Implant Date: 20050218
Implantable Lead Implant Date: 20050218
Implantable Lead Implant Date: 20050218
Implantable Lead Location: 753858
Implantable Lead Location: 753859
Implantable Lead Location: 753860
Implantable Lead Model: 4194
Implantable Lead Model: 5076
Implantable Lead Model: 6947
Implantable Pulse Generator Implant Date: 20230313
Lead Channel Impedance Value: 304 Ohm
Lead Channel Impedance Value: 342 Ohm
Lead Channel Impedance Value: 399 Ohm
Lead Channel Impedance Value: 513 Ohm
Lead Channel Impedance Value: 570 Ohm
Lead Channel Impedance Value: 703 Ohm
Lead Channel Pacing Threshold Amplitude: 1 V
Lead Channel Pacing Threshold Amplitude: 1.125 V
Lead Channel Pacing Threshold Pulse Width: 0.4 ms
Lead Channel Pacing Threshold Pulse Width: 0.4 ms
Lead Channel Sensing Intrinsic Amplitude: 1 mV
Lead Channel Sensing Intrinsic Amplitude: 10.625 mV
Lead Channel Sensing Intrinsic Amplitude: 10.625 mV
Lead Channel Setting Pacing Amplitude: 2 V
Lead Channel Setting Pacing Amplitude: 2.5 V
Lead Channel Setting Pacing Pulse Width: 0.4 ms
Lead Channel Setting Pacing Pulse Width: 0.4 ms
Lead Channel Setting Sensing Sensitivity: 0.3 mV

## 2023-06-14 ENCOUNTER — Encounter

## 2023-06-22 ENCOUNTER — Other Ambulatory Visit: Payer: Self-pay | Admitting: Cardiology

## 2023-06-22 DIAGNOSIS — Z5181 Encounter for therapeutic drug level monitoring: Secondary | ICD-10-CM

## 2023-06-22 DIAGNOSIS — I4821 Permanent atrial fibrillation: Secondary | ICD-10-CM

## 2023-06-26 ENCOUNTER — Other Ambulatory Visit: Payer: Self-pay | Admitting: Family Medicine

## 2023-06-26 DIAGNOSIS — E039 Hypothyroidism, unspecified: Secondary | ICD-10-CM

## 2023-07-20 ENCOUNTER — Encounter (HOSPITAL_COMMUNITY): Admitting: Cardiology

## 2023-08-02 NOTE — Progress Notes (Signed)
 Remote ICD transmission.

## 2023-08-02 NOTE — Addendum Note (Signed)
 Addended by: Edra Govern D on: 08/02/2023 11:24 AM   Modules accepted: Orders

## 2023-08-08 ENCOUNTER — Other Ambulatory Visit (HOSPITAL_COMMUNITY): Payer: Self-pay

## 2023-08-29 DEATH — deceased

## 2023-09-04 ENCOUNTER — Ambulatory Visit: Admitting: Physician Assistant

## 2023-09-04 ENCOUNTER — Ambulatory Visit (HOSPITAL_COMMUNITY)
